# Patient Record
Sex: Female | Born: 1940 | Race: White | Hispanic: No | Smoking: Never smoker
Health system: Southern US, Community
[De-identification: ages and names within clinical notes are randomized; demographics above are authoritative.]

## PROBLEM LIST (undated history)

## (undated) DIAGNOSIS — C4490 Unspecified malignant neoplasm of skin, unspecified: Secondary | ICD-10-CM

## (undated) DIAGNOSIS — R9431 Abnormal electrocardiogram [ECG] [EKG]: Secondary | ICD-10-CM

## (undated) DIAGNOSIS — R053 Chronic cough: Secondary | ICD-10-CM

## (undated) DIAGNOSIS — R001 Bradycardia, unspecified: Secondary | ICD-10-CM

## (undated) DIAGNOSIS — Z7982 Long term (current) use of aspirin: Secondary | ICD-10-CM

## (undated) DIAGNOSIS — N8111 Cystocele, midline: Secondary | ICD-10-CM

## (undated) DIAGNOSIS — R0600 Dyspnea, unspecified: Secondary | ICD-10-CM

## (undated) DIAGNOSIS — Z9841 Cataract extraction status, right eye: Secondary | ICD-10-CM

## (undated) DIAGNOSIS — Z8719 Personal history of other diseases of the digestive system: Secondary | ICD-10-CM

## (undated) DIAGNOSIS — K760 Fatty (change of) liver, not elsewhere classified: Secondary | ICD-10-CM

## (undated) DIAGNOSIS — E785 Hyperlipidemia, unspecified: Secondary | ICD-10-CM

## (undated) DIAGNOSIS — M329 Systemic lupus erythematosus, unspecified: Secondary | ICD-10-CM

## (undated) DIAGNOSIS — I341 Nonrheumatic mitral (valve) prolapse: Secondary | ICD-10-CM

## (undated) DIAGNOSIS — I10 Essential (primary) hypertension: Secondary | ICD-10-CM

## (undated) DIAGNOSIS — R490 Dysphonia: Secondary | ICD-10-CM

## (undated) DIAGNOSIS — Z87440 Personal history of urinary (tract) infections: Secondary | ICD-10-CM

## (undated) DIAGNOSIS — I2510 Atherosclerotic heart disease of native coronary artery without angina pectoris: Secondary | ICD-10-CM

## (undated) DIAGNOSIS — C801 Malignant (primary) neoplasm, unspecified: Secondary | ICD-10-CM

## (undated) DIAGNOSIS — Q790 Congenital diaphragmatic hernia: Secondary | ICD-10-CM

## (undated) DIAGNOSIS — F419 Anxiety disorder, unspecified: Secondary | ICD-10-CM

## (undated) DIAGNOSIS — N200 Calculus of kidney: Secondary | ICD-10-CM

## (undated) DIAGNOSIS — D869 Sarcoidosis, unspecified: Secondary | ICD-10-CM

## (undated) DIAGNOSIS — IMO0002 Reserved for concepts with insufficient information to code with codable children: Secondary | ICD-10-CM

## (undated) DIAGNOSIS — I700 Atherosclerosis of aorta: Secondary | ICD-10-CM

## (undated) DIAGNOSIS — J849 Interstitial pulmonary disease, unspecified: Secondary | ICD-10-CM

## (undated) DIAGNOSIS — T7840XA Allergy, unspecified, initial encounter: Secondary | ICD-10-CM

## (undated) DIAGNOSIS — K219 Gastro-esophageal reflux disease without esophagitis: Secondary | ICD-10-CM

## (undated) DIAGNOSIS — I272 Pulmonary hypertension, unspecified: Secondary | ICD-10-CM

## (undated) DIAGNOSIS — I493 Ventricular premature depolarization: Secondary | ICD-10-CM

## (undated) HISTORY — DX: Bradycardia, unspecified: R00.1

## (undated) HISTORY — DX: Anxiety disorder, unspecified: F41.9

## (undated) HISTORY — DX: Reserved for concepts with insufficient information to code with codable children: IMO0002

## (undated) HISTORY — DX: Gilbert syndrome: E80.4

## (undated) HISTORY — PX: CATARACT EXTRACTION, BILATERAL: SHX1313

## (undated) HISTORY — DX: Gastro-esophageal reflux disease without esophagitis: K21.9

## (undated) HISTORY — DX: Personal history of urinary (tract) infections: Z87.440

## (undated) HISTORY — DX: Allergy, unspecified, initial encounter: T78.40XA

## (undated) HISTORY — DX: Abnormal electrocardiogram (ECG) (EKG): R94.31

## (undated) HISTORY — DX: Essential (primary) hypertension: I10

## (undated) HISTORY — DX: Hyperlipidemia, unspecified: E78.5

## (undated) HISTORY — PX: ABDOMINAL HYSTERECTOMY: SHX81

## (undated) HISTORY — PX: TUBAL LIGATION: SHX77

---

## 1998-06-15 DIAGNOSIS — I10 Essential (primary) hypertension: Secondary | ICD-10-CM

## 1998-06-15 DIAGNOSIS — E785 Hyperlipidemia, unspecified: Secondary | ICD-10-CM

## 1998-06-15 HISTORY — DX: Hyperlipidemia, unspecified: E78.5

## 1998-06-15 HISTORY — DX: Essential (primary) hypertension: I10

## 2000-12-15 ENCOUNTER — Encounter: Payer: Self-pay | Admitting: Family Medicine

## 2000-12-15 ENCOUNTER — Ambulatory Visit (HOSPITAL_COMMUNITY): Admission: RE | Admit: 2000-12-15 | Discharge: 2000-12-15 | Payer: Self-pay | Admitting: Family Medicine

## 2000-12-28 ENCOUNTER — Other Ambulatory Visit: Admission: RE | Admit: 2000-12-28 | Discharge: 2000-12-28 | Payer: Self-pay | Admitting: Family Medicine

## 2001-01-18 ENCOUNTER — Ambulatory Visit (HOSPITAL_COMMUNITY): Admission: RE | Admit: 2001-01-18 | Discharge: 2001-01-18 | Payer: Self-pay | Admitting: Family Medicine

## 2001-01-18 ENCOUNTER — Encounter: Payer: Self-pay | Admitting: Family Medicine

## 2001-02-09 ENCOUNTER — Ambulatory Visit (HOSPITAL_COMMUNITY): Admission: RE | Admit: 2001-02-09 | Discharge: 2001-02-09 | Payer: Self-pay | Admitting: General Surgery

## 2001-11-24 ENCOUNTER — Other Ambulatory Visit: Admission: RE | Admit: 2001-11-24 | Discharge: 2001-11-24 | Payer: Self-pay | Admitting: Dermatology

## 2002-01-12 ENCOUNTER — Ambulatory Visit (HOSPITAL_COMMUNITY): Admission: RE | Admit: 2002-01-12 | Discharge: 2002-01-12 | Payer: Self-pay | Admitting: Family Medicine

## 2002-01-12 ENCOUNTER — Encounter: Payer: Self-pay | Admitting: Family Medicine

## 2003-02-27 ENCOUNTER — Ambulatory Visit (HOSPITAL_COMMUNITY): Admission: RE | Admit: 2003-02-27 | Discharge: 2003-02-27 | Payer: Self-pay | Admitting: Family Medicine

## 2003-02-27 ENCOUNTER — Encounter: Payer: Self-pay | Admitting: Family Medicine

## 2003-03-06 ENCOUNTER — Encounter: Payer: Self-pay | Admitting: Family Medicine

## 2003-03-06 ENCOUNTER — Ambulatory Visit (HOSPITAL_COMMUNITY): Admission: RE | Admit: 2003-03-06 | Discharge: 2003-03-06 | Payer: Self-pay | Admitting: Family Medicine

## 2004-02-28 ENCOUNTER — Ambulatory Visit (HOSPITAL_COMMUNITY): Admission: RE | Admit: 2004-02-28 | Discharge: 2004-02-28 | Payer: Self-pay | Admitting: Family Medicine

## 2005-03-23 ENCOUNTER — Ambulatory Visit: Payer: Self-pay | Admitting: Family Medicine

## 2005-04-03 ENCOUNTER — Ambulatory Visit (HOSPITAL_COMMUNITY): Admission: RE | Admit: 2005-04-03 | Discharge: 2005-04-03 | Payer: Self-pay | Admitting: Family Medicine

## 2005-10-26 ENCOUNTER — Ambulatory Visit: Payer: Self-pay | Admitting: Family Medicine

## 2005-11-16 ENCOUNTER — Ambulatory Visit (HOSPITAL_COMMUNITY): Admission: RE | Admit: 2005-11-16 | Discharge: 2005-11-16 | Payer: Self-pay | Admitting: Family Medicine

## 2006-03-25 ENCOUNTER — Ambulatory Visit: Payer: Self-pay | Admitting: Family Medicine

## 2006-03-25 ENCOUNTER — Encounter: Payer: Self-pay | Admitting: Family Medicine

## 2006-03-25 ENCOUNTER — Other Ambulatory Visit: Admission: RE | Admit: 2006-03-25 | Discharge: 2006-03-25 | Payer: Self-pay | Admitting: Family Medicine

## 2006-03-25 LAB — CONVERTED CEMR LAB: Pap Smear: NORMAL

## 2006-04-07 ENCOUNTER — Ambulatory Visit (HOSPITAL_COMMUNITY): Admission: RE | Admit: 2006-04-07 | Discharge: 2006-04-07 | Payer: Self-pay | Admitting: Family Medicine

## 2006-11-15 ENCOUNTER — Encounter: Payer: Self-pay | Admitting: Family Medicine

## 2006-11-15 LAB — CONVERTED CEMR LAB
AST: 27 units/L (ref 0–37)
Albumin: 4.5 g/dL (ref 3.5–5.2)
Alkaline Phosphatase: 49 units/L (ref 39–117)
Calcium: 9 mg/dL (ref 8.4–10.5)
Creatinine, Ser: 0.82 mg/dL (ref 0.40–1.20)
HDL: 80 mg/dL (ref >39)
Total Protein: 6.9 g/dL (ref 6.0–8.3)
Triglycerides: 71 mg/dL (ref <150)

## 2007-04-25 ENCOUNTER — Ambulatory Visit (HOSPITAL_COMMUNITY): Admission: RE | Admit: 2007-04-25 | Discharge: 2007-04-25 | Payer: Self-pay | Admitting: Family Medicine

## 2007-04-28 ENCOUNTER — Encounter: Payer: Self-pay | Admitting: Family Medicine

## 2007-04-28 ENCOUNTER — Other Ambulatory Visit: Admission: RE | Admit: 2007-04-28 | Discharge: 2007-04-28 | Payer: Self-pay | Admitting: Family Medicine

## 2007-04-28 ENCOUNTER — Ambulatory Visit: Payer: Self-pay | Admitting: Family Medicine

## 2007-04-28 LAB — CONVERTED CEMR LAB
ALT: 27 units/L (ref 0–35)
AST: 25 units/L (ref 0–37)
Albumin: 4.6 g/dL (ref 3.5–5.2)
Alkaline Phosphatase: 58 units/L (ref 39–117)
BUN: 21 mg/dL (ref 6–23)
Basophils Absolute: 0 10*3/uL (ref 0.0–0.1)
Basophils Relative: 1 % (ref 0–1)
Bilirubin, Direct: 0.3 mg/dL (ref 0.0–0.3)
CO2: 26 meq/L (ref 19–32)
Calcium: 9.8 mg/dL (ref 8.4–10.5)
Chloride: 102 meq/L (ref 96–112)
Cholesterol: 206 mg/dL — ABNORMAL HIGH (ref 0–200)
Creatinine, Ser: 0.91 mg/dL (ref 0.40–1.20)
Eosinophils Absolute: 0.1 10*3/uL — ABNORMAL LOW (ref 0.2–0.7)
Eosinophils Relative: 2 % (ref 0–5)
Glucose, Bld: 95 mg/dL (ref 70–99)
HCT: 43.1 % (ref 36.0–46.0)
HDL: 85 mg/dL (ref >39)
Hemoglobin: 14.6 g/dL (ref 12.0–15.0)
Indirect Bilirubin: 1.5 mg/dL — ABNORMAL HIGH (ref 0.0–0.9)
LDL Cholesterol: 102 mg/dL — ABNORMAL HIGH (ref 0–99)
Lymphocytes Relative: 30 % (ref 12–46)
Lymphs Abs: 1 10*3/uL (ref 0.7–4.0)
MCHC: 33.9 g/dL (ref 30.0–36.0)
MCV: 91.5 fL (ref 78.0–100.0)
Monocytes Absolute: 0.3 10*3/uL (ref 0.1–1.0)
Monocytes Relative: 7 % (ref 3–12)
Neutro Abs: 2 10*3/uL (ref 1.7–7.7)
Neutrophils Relative %: 59 % (ref 43–77)
Pap Smear: NORMAL
Platelets: 194 10*3/uL (ref 150–400)
Potassium: 4 meq/L (ref 3.5–5.3)
RBC: 4.71 M/uL (ref 3.87–5.11)
RDW: 13 % (ref 11.5–15.5)
Sodium: 140 meq/L (ref 135–145)
Total Bilirubin: 1.8 mg/dL — ABNORMAL HIGH (ref 0.3–1.2)
Total CHOL/HDL Ratio: 2.4
Total Protein: 7.2 g/dL (ref 6.0–8.3)
Triglycerides: 96 mg/dL (ref <150)
VLDL: 19 mg/dL (ref 0–40)
WBC: 3.4 10*3/uL — ABNORMAL LOW (ref 4.0–10.5)

## 2007-06-02 ENCOUNTER — Encounter: Payer: Self-pay | Admitting: Family Medicine

## 2007-06-02 DIAGNOSIS — M5442 Lumbago with sciatica, left side: Secondary | ICD-10-CM | POA: Insufficient documentation

## 2007-06-02 DIAGNOSIS — H9190 Unspecified hearing loss, unspecified ear: Secondary | ICD-10-CM | POA: Insufficient documentation

## 2007-06-02 DIAGNOSIS — I10 Essential (primary) hypertension: Secondary | ICD-10-CM | POA: Insufficient documentation

## 2007-06-02 DIAGNOSIS — N329 Bladder disorder, unspecified: Secondary | ICD-10-CM | POA: Insufficient documentation

## 2007-06-02 DIAGNOSIS — M5441 Lumbago with sciatica, right side: Secondary | ICD-10-CM | POA: Insufficient documentation

## 2007-06-02 DIAGNOSIS — R945 Abnormal results of liver function studies: Secondary | ICD-10-CM | POA: Insufficient documentation

## 2007-06-02 DIAGNOSIS — E785 Hyperlipidemia, unspecified: Secondary | ICD-10-CM | POA: Insufficient documentation

## 2007-09-26 ENCOUNTER — Encounter: Payer: Self-pay | Admitting: Family Medicine

## 2007-09-26 LAB — CONVERTED CEMR LAB
ALT: 21 units/L (ref 0–35)
Albumin: 4.7 g/dL (ref 3.5–5.2)
Alkaline Phosphatase: 51 units/L (ref 39–117)
BUN: 23 mg/dL (ref 6–23)
Basophils Absolute: 0 10*3/uL (ref 0.0–0.1)
Basophils Relative: 1 % (ref 0–1)
Chloride: 102 meq/L (ref 96–112)
Cholesterol: 204 mg/dL — ABNORMAL HIGH (ref 0–200)
Eosinophils Absolute: 0.1 10*3/uL (ref 0.0–0.7)
HDL: 81 mg/dL (ref >39)
Indirect Bilirubin: 1.1 mg/dL — ABNORMAL HIGH (ref 0.0–0.9)
LDL Cholesterol: 109 mg/dL — ABNORMAL HIGH (ref 0–99)
MCHC: 33.4 g/dL (ref 30.0–36.0)
MCV: 92.1 fL (ref 78.0–100.0)
Neutro Abs: 1.6 10*3/uL — ABNORMAL LOW (ref 1.7–7.7)
Neutrophils Relative %: 56 % (ref 43–77)
Platelets: 210 10*3/uL (ref 150–400)
Potassium: 4 meq/L (ref 3.5–5.3)
RDW: 13.4 % (ref 11.5–15.5)
Total Protein: 7.1 g/dL (ref 6.0–8.3)
Triglycerides: 71 mg/dL (ref <150)
VLDL: 14 mg/dL (ref 0–40)

## 2008-01-24 ENCOUNTER — Telehealth: Payer: Self-pay | Admitting: Family Medicine

## 2008-04-02 ENCOUNTER — Encounter: Payer: Self-pay | Admitting: Family Medicine

## 2008-04-18 ENCOUNTER — Encounter: Payer: Self-pay | Admitting: Family Medicine

## 2008-05-02 ENCOUNTER — Telehealth: Payer: Self-pay | Admitting: Family Medicine

## 2008-06-01 ENCOUNTER — Ambulatory Visit (HOSPITAL_COMMUNITY): Admission: RE | Admit: 2008-06-01 | Discharge: 2008-06-01 | Payer: Self-pay | Admitting: Family Medicine

## 2008-06-13 ENCOUNTER — Encounter: Payer: Self-pay | Admitting: Family Medicine

## 2008-06-20 ENCOUNTER — Other Ambulatory Visit: Admission: RE | Admit: 2008-06-20 | Discharge: 2008-06-20 | Payer: Self-pay | Admitting: Family Medicine

## 2008-06-20 ENCOUNTER — Encounter: Payer: Self-pay | Admitting: Family Medicine

## 2008-06-20 ENCOUNTER — Ambulatory Visit: Payer: Self-pay | Admitting: Family Medicine

## 2008-06-20 DIAGNOSIS — R5383 Other fatigue: Secondary | ICD-10-CM

## 2008-06-20 DIAGNOSIS — R5381 Other malaise: Secondary | ICD-10-CM | POA: Insufficient documentation

## 2008-06-25 ENCOUNTER — Ambulatory Visit (HOSPITAL_COMMUNITY): Admission: RE | Admit: 2008-06-25 | Discharge: 2008-06-25 | Payer: Self-pay | Admitting: Family Medicine

## 2008-06-25 ENCOUNTER — Encounter: Payer: Self-pay | Admitting: Family Medicine

## 2008-06-26 ENCOUNTER — Encounter: Payer: Self-pay | Admitting: Family Medicine

## 2008-06-29 ENCOUNTER — Encounter: Payer: Self-pay | Admitting: Family Medicine

## 2008-07-03 LAB — CONVERTED CEMR LAB
Albumin: 4.3 g/dL (ref 3.5–5.2)
Alkaline Phosphatase: 56 units/L (ref 39–117)
Basophils Absolute: 0 10*3/uL (ref 0.0–0.1)
Basophils Relative: 1 % (ref 0–1)
CO2: 28 meq/L (ref 19–32)
Chloride: 103 meq/L (ref 96–112)
HDL: 82 mg/dL (ref >39)
Hemoglobin: 13.7 g/dL (ref 12.0–15.0)
LDL Cholesterol: 92 mg/dL (ref 0–99)
Lymphocytes Relative: 32 % (ref 12–46)
MCHC: 32.8 g/dL (ref 30.0–36.0)
Neutro Abs: 1.5 10*3/uL — ABNORMAL LOW (ref 1.7–7.7)
Neutrophils Relative %: 54 % (ref 43–77)
Potassium: 3.7 meq/L (ref 3.5–5.3)
RBC: 4.62 M/uL (ref 3.87–5.11)
RDW: 13 % (ref 11.5–15.5)
Total Bilirubin: 1.4 mg/dL — ABNORMAL HIGH (ref 0.3–1.2)
Total CHOL/HDL Ratio: 2.3
Total Protein: 6.7 g/dL (ref 6.0–8.3)
Triglycerides: 84 mg/dL (ref <150)
VLDL: 17 mg/dL (ref 0–40)
Vit D, 1,25-Dihydroxy: 36 (ref 30–89)

## 2008-10-15 ENCOUNTER — Telehealth: Payer: Self-pay | Admitting: Family Medicine

## 2009-05-06 ENCOUNTER — Encounter: Payer: Self-pay | Admitting: Family Medicine

## 2009-06-28 ENCOUNTER — Other Ambulatory Visit: Admission: RE | Admit: 2009-06-28 | Discharge: 2009-06-28 | Payer: Self-pay | Admitting: Family Medicine

## 2009-06-28 ENCOUNTER — Ambulatory Visit (HOSPITAL_COMMUNITY): Admission: RE | Admit: 2009-06-28 | Discharge: 2009-06-28 | Payer: Self-pay | Admitting: Family Medicine

## 2009-06-28 ENCOUNTER — Ambulatory Visit: Payer: Self-pay | Admitting: Family Medicine

## 2009-06-28 DIAGNOSIS — M899 Disorder of bone, unspecified: Secondary | ICD-10-CM | POA: Insufficient documentation

## 2009-06-28 DIAGNOSIS — M949 Disorder of cartilage, unspecified: Secondary | ICD-10-CM

## 2009-06-28 LAB — CONVERTED CEMR LAB: OCCULT 1: NEGATIVE

## 2009-07-01 LAB — CONVERTED CEMR LAB
AST: 20 units/L (ref 0–37)
Albumin: 4.6 g/dL (ref 3.5–5.2)
Alkaline Phosphatase: 57 units/L (ref 39–117)
Calcium: 9.8 mg/dL (ref 8.4–10.5)
Chloride: 102 meq/L (ref 96–112)
Creatinine, Ser: 0.68 mg/dL (ref 0.40–1.20)
Eosinophils Absolute: 0.1 10*3/uL (ref 0.0–0.7)
HDL: 74 mg/dL (ref >39)
LDL Cholesterol: 102 mg/dL — ABNORMAL HIGH (ref 0–99)
Lymphs Abs: 0.9 10*3/uL (ref 0.7–4.0)
MCV: 89.7 fL (ref 78.0–100.0)
Neutro Abs: 1.9 10*3/uL (ref 1.7–7.7)
Neutrophils Relative %: 61 % (ref 43–77)
Platelets: 201 10*3/uL (ref 150–400)
RBC: 4.45 M/uL (ref 3.87–5.11)
Sodium: 141 meq/L (ref 135–145)
Total CHOL/HDL Ratio: 2.6
Total Protein: 6.9 g/dL (ref 6.0–8.3)
Triglycerides: 87 mg/dL (ref <150)
WBC: 3.1 10*3/uL — ABNORMAL LOW (ref 4.0–10.5)

## 2009-08-09 ENCOUNTER — Ambulatory Visit: Payer: Self-pay | Admitting: Family Medicine

## 2009-09-23 ENCOUNTER — Encounter: Payer: Self-pay | Admitting: Family Medicine

## 2009-09-23 ENCOUNTER — Telehealth: Payer: Self-pay | Admitting: Family Medicine

## 2009-12-30 ENCOUNTER — Telehealth: Payer: Self-pay | Admitting: Family Medicine

## 2010-04-08 ENCOUNTER — Telehealth: Payer: Self-pay | Admitting: Family Medicine

## 2010-05-23 ENCOUNTER — Encounter: Payer: Self-pay | Admitting: Family Medicine

## 2010-07-09 ENCOUNTER — Telehealth: Payer: Self-pay | Admitting: Family Medicine

## 2010-07-09 ENCOUNTER — Other Ambulatory Visit: Payer: Self-pay | Admitting: Family Medicine

## 2010-07-09 DIAGNOSIS — Z139 Encounter for screening, unspecified: Secondary | ICD-10-CM

## 2010-07-15 ENCOUNTER — Other Ambulatory Visit: Payer: Self-pay | Admitting: Family Medicine

## 2010-07-15 ENCOUNTER — Encounter: Payer: Self-pay | Admitting: Family Medicine

## 2010-07-15 ENCOUNTER — Ambulatory Visit
Admission: RE | Admit: 2010-07-15 | Discharge: 2010-07-15 | Payer: Self-pay | Source: Home / Self Care | Attending: Family Medicine | Admitting: Family Medicine

## 2010-07-15 ENCOUNTER — Other Ambulatory Visit (HOSPITAL_COMMUNITY)
Admission: RE | Admit: 2010-07-15 | Discharge: 2010-07-15 | Disposition: A | Discharge status: Home / Self Care | Payer: Medicare Other | Source: Ambulatory Visit | Attending: Family Medicine | Admitting: Family Medicine

## 2010-07-15 DIAGNOSIS — Z124 Encounter for screening for malignant neoplasm of cervix: Secondary | ICD-10-CM | POA: Insufficient documentation

## 2010-07-15 NOTE — Progress Notes (Signed)
Summary: [facility]  Phone Note Call from Patient   Summary of Call: NEEDS HER ALTACE  SEND TO [facility]  Initial call taken by: Lind Guest,  April 08, 2010 9:23 AM    Prescriptions: ALTACE 10 MG  TABS (RAMIPRIL) Take one tablet by mouth once a day  #90 x 0   Entered by:   Adella Hare LPN   Authorized by:   Syliva Overman MD   Signed by:   Adella Hare LPN on 16/03/9603   Method used:   Faxed to ...       [facility] (mail-order)             , [place]         Ph:        Fax: [phone]   RxID:   [id number]

## 2010-07-15 NOTE — Assessment & Plan Note (Signed)
Summary: nurse visit  Nurse Visit   Vital Signs:  Patient profile:   70 year old female Menstrual status:  postmenopausal Height:      60 inches Weight:      132 pounds BMI:     25.87 O2 Sat:      97 % Pulse rate:   91 / minute Resp:     16 per minute BP sitting:   148 / 80 Cuff size:   regular  Vitals Entered By: Everitt Amber LPN (August 09, 2009 1:02 PM) Comments BP check     Allergies: 1)  ! Bactrim  Orders Added: 1)  No Charge Patient Arrived (NCPA0) [NCPA0] recheck  148/80 and 150/ 80 which correlates with her machine, her log shows most often BP less than 140, so no med changes, if the SBp  is over 140 mosrt of the time call for office visit.

## 2010-07-15 NOTE — Assessment & Plan Note (Signed)
Summary: phy   Vital Signs:  Patient profile:   70 year old female Menstrual status:  postmenopausal Height:      60 inches Weight:      131 pounds BMI:     25.68 O2 Sat:      97 % Pulse rate:   56 / minute Pulse rhythm:   regular Resp:     16 per minute BP sitting:   140 / 80  (left arm)  Vitals Entered By: Everitt Amber (June 28, 2009 9:36 AM)  Nutrition Counseling: Patient's BMI is greater than 25 and therefore counseled on weight management options. CC: CPE  Vision Screening:Left eye with correction: 20 / 30 Right eye with correction: 20 / 25 Both eyes with correction: 20 / 25  Color vision testing: normal      Vision Entered By: Everitt Amber (June 28, 2009 9:43 AM)     Menstrual Status postmenopausal Last PAP Result NEGATIVE FOR INTRAEPITHELIAL LESIONS OR MALIGNANCY.   Primary Care Provider:  Syliva Overman MD  CC:  CPE.  History of Present Illness: Reports  that she has been  doing well. Denies recent fever or chills. Denies sinus pressure, nasal congestion , ear pain or sore throat. Denies chest congestion, or cough productive of sputum. Denies chest pain, palpitations, PND, orthopnea or leg swelling. Denies abdominal pain, nausea, vomitting, diarrhea or constipation. Denies change in bowel movements or bloody stool. 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 Not committing to pelvic US for further eval, concerned about ins coverage. Denies  joint pain, she is very aware of the limitations in her back and actsappropriately to reduce the risk of injury.She recently note a swelling on the knuckle of her right index finger which is essentially painless, she does report some weakness in the grip however. Denies headaches, vertigo, seizures.Chronic hearing loss. Denies depression, anxiety or insomnia. Denies  rash, lesions, or itch.     Current  Medications (verified): 1)  Altace 10 Mg  Tabs (Ramipril) .... Take One Tablet By Mouth Once A Day 2)  Zocor 10 Mg  Tabs (Simvastatin) .... Take One Tablet By Mouth Once A Day 3)  Hydrochlorothiazide 25 Mg  Tabs (Hydrochlorothiazide) .... Take One Tablet By Mouth Once Daily 4)  Aspirin 81 Mg  Tbec (Aspirin) .... Take One Tablet By Mouth Once A Day  Allergies (verified): 1)  ! Bactrim  Review of Systems      See HPI Eyes:  Denies blurring and discharge; has had eye exam recently. ENT:  Complains of decreased hearing; chronic. GU:  See HPI. MS:  See HPI. Neuro:  Denies headaches, seizures, and sensation of room spinning. Heme:  Denies abnormal bruising and bleeding. Allergy:  Denies hives or rash and sneezing.  Physical Exam  General:  Well-developed,well-nourished,in no acute distress; alert,appropriate and cooperative throughout examination Head:  Normocephalic and atraumatic without obvious abnormalities. No apparent alopecia or balding. Eyes:  No corneal or conjunctival inflammation noted. EOMI. Perrla. Funduscopic exam benign, without hemorrhages, exudates or papilledema. Vision grossly normal. Ears:  External ear exam shows no significant lesions or deformities.  Otoscopic examination reveals clear canals, tympanic membranes are intact bilaterally without bulging, retraction, inflammation or discharge. Hearing is grossly normal bilaterally. Nose:  External nasal examination shows no deformity or inflammation. Nasal mucosa are pink and moist without lesions or exudates. Mouth:  pharynx pink and moist and fair dentition.   Neck:  No deformities,  masses, or tenderness noted. Chest Wall:  No deformities, masses, or tenderness noted. Breasts:  No mass, nodules, thickening, tenderness, bulging, retraction, inflamation, nipple discharge or skin changes noted.   Lungs:  Normal respiratory effort, chest expands symmetrically. Lungs are clear to auscultation, no crackles or wheezes. Heart:   Normal rate and regular rhythm. S1 and S2 normal without gallop, murmur, click, rub or other extra sounds. Abdomen:  Bowel sounds positive,abdomen soft and non-tender without masses, organomegaly or hernias noted. Rectal:  No external abnormalities noted. Normal sphincter tone. No rectal masses or tenderness.Guaic neg stool Genitalia:  Normal introitus for age, no external lesions, no vaginal discharge, mucosa pink and moist, no vaginal or cervical lesions, no vaginal atrophy, no friaility or hemorrhage, normal uterus size and position, no adnexal masses or tenderness. positive bladder prolapse which is not new Msk:  No deformity or scoliosis noted of thoracic or lumbar spine.   Pulses:  R and L carotid,radial,femoral,dorsalis pedis and posterior tibial pulses are full and equal bilaterally Extremities:  No clubbing, cyanosis, edema, or deformity noted with normal full range of motion of all joints.   Neurologic:  alert & oriented X3 and cranial nerves II-XII intact, except for cN7  alert & oriented X3, cranial nerves II-XII intact, strength normal in all extremities, sensation intact to light touch, gait normal, and finger-to-nose normal.   Skin:  multiple warts and moles on the torso, also SK Cervical Nodes:  No lymphadenopathy noted Axillary Nodes:  No palpable lymphadenopathy Inguinal Nodes:  No significant adenopathy Psych:  Cognition and judgment appear intact. Alert and cooperative with normal attention span and concentration. No apparent delusions, illusions, hallucinations   Impression & Recommendations:  Problem # 1:  SPECIAL SCREENING FOR MALIGNANT NEOPLASMS COLON (ICD-V76.51) Assessment Comment Only  Orders: Hemoccult Guaiac-1 spec.(in office) (82270) guaic neg stool, no rectal mass palpated  Problem # 2:  OSTEOPENIA (ICD-733.90) Assessment: Comment Only  Orders: T-Vitamin D (25-Hydroxy) ([id number])  Problem # 3:  BLADDER PROLAPSE (ICD-596.9) Assessment:  Unchanged  Problem # 4:  BACK PAIN, CHRONIC (ICD-724.5) Assessment: Improved  Her updated medication list for this problem includes:    Aspirin 81 Mg Tbec (Aspirin) .Marland Kitchen... Take one tablet by mouth once a day  Problem # 5:  HYPERLIPIDEMIA (ICD-272.4) Assessment: Comment Only  Her updated medication list for this problem includes:    Zocor 10 Mg Tabs (Simvastatin) .Marland Kitchen... 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 Take one tablet by mouth once a day    Hydrochlorothiazide 25 Mg Tabs (Hydrochlorothiazide) .Marland Kitchen... Take one tablet by mouth once daily  Orders: T-Basic Metabolic Panel [phone])  BP today: 140/80 Prior BP: 130/70 (06/20/2008)  Labs Reviewed: K+: 3.7 (06/29/2008) Creat: : 0.81 (06/29/2008)   Chol: 191 (06/29/2008)   HDL: 82 (06/29/2008)   LDL: 92 (06/29/2008)   TG: 84 (06/29/2008)  Complete Medication List: 1)  Altace 10 Mg Tabs (Ramipril) .... Take one tablet by mouth once  a day 2)  Zocor 10 Mg Tabs (Simvastatin) .... Take one tablet by mouth once a day 3)  Hydrochlorothiazide 25 Mg Tabs (Hydrochlorothiazide) .... Take one tablet by mouth once daily 4)  Aspirin 81 Mg Tbec (Aspirin) .... Take one tablet by mouth once a day  Other Orders: T-CBC w/Diff ([id number]) Pelvic & Breast Exam ( Medicare)  (Q5956)  Patient Instructions: 1)  Nurse bP check in 6 weeks,  pLEASE bring your cuff. 2)  MD f/u in 1 year, per pot only visit covered is "welness exam" 3)  It is important that you exercise regularly at least 20 minutes 5 times a week. If you develop chest pain, have severe difficulty breathing, or feel very tired , stop exercising immediately and seek medical attention. 4)  You need to lose weight. Consider a lower calorie diet and regular exercise.  5)  BMP prior to visit, ICD-9: 6)  Hepatic Panel prior to visit, ICD-9: today 7)  Lipid Panel prior to visit, ICD-9: 8)  Vit D level osteopenia Prescriptions: ZOCOR 10 MG  TABS (SIMVASTATIN) Take one tablet by mouth once a day  #90 x 3   Entered by:   Everitt Amber   Authorized by:   Syliva Overman MD   Signed by:   Everitt Amber on 06/28/2009   Method used:   Printed then faxed to ...       [facility] (mail-order)             , [place]         Ph:        Fax: [phone]   RxID:   [id number] ALTACE 10 MG  TABS (RAMIPRIL) Take one tablet by mouth once a day  #90 x 0   Entered by:   Everitt Amber   Authorized by:   Syliva Overman MD   Signed by:   Everitt Amber on 06/28/2009   Method used:   Printed then faxed to ...       [facility] (mail-order)             , [place]         Ph:        Fax: [phone]   RxID:   [id number] ZOCOR 10 MG  TABS (SIMVASTATIN) Take one tablet by mouth once a day  #90 x 3   Entered by:   Everitt Amber   Authorized by:   Syliva Overman MD   Signed by:   Everitt Amber on 06/28/2009   Method used:   Faxed to ...       [facility] (mail-order)             , [place]         Ph:        Fax: [phone]   RxID:   [id number] ALTACE 10 MG  TABS (RAMIPRIL) Take one tablet by mouth once a day  #90 x 0   Entered by:   Everitt Amber   Authorized by:   Syliva Overman MD   Signed by:   Everitt Amber on 06/28/2009   Method used:   Faxed to ...       [facility] [employer])             , [place]         Ph:        Fax: [phone]   RxID:   [id number]   Laboratory  Results   Blood Tests   Date/Time Received:  June 28, 2009  Date/Time Reported: June 28, 2009      Stool - Occult Blood Hemmoccult #1: negative Date: 06/28/2009 Comments: [id number] 9r 01/2010 18 10/12     Appended Document: phy pls recode if needed so the cost is coverd, pt voices repeated concerns about coverage, staes her ins only pays for once yearlyWellness exams", and she actually comes in onlyonce yearly for the most part...help needed, she goes on and on , even still talking about last years bll which is unresolved it appears!   Appended Document: phy Appears to be coded correctly.

## 2010-07-15 NOTE — Progress Notes (Signed)
Summary: medicine  Phone Note Call from Patient   Summary of Call: needs refill on her altace 10 mg call in at [facility] leaving saturday for vacation  needs this medicine  call back at [phone] to let her know  Initial call taken by: Lind Guest,  September 23, 2009 9:30 AM  Follow-up for Phone Call        Rx Called In Follow-up by: Adella Hare LPN,  September 23, 2009 9:33 AM    Prescriptions: ALTACE 10 MG  TABS (RAMIPRIL) Take one tablet by mouth once a day  #90 x 0   Entered by:   Adella Hare LPN   Authorized by:   Syliva Overman MD   Signed by:   Adella Hare LPN on 16/03/9603   Method used:   Faxed to ...       [facility] (mail-order)             , [place]         Ph:        Fax: [phone]   RxID:   [id number]

## 2010-07-15 NOTE — Assessment & Plan Note (Signed)
  Nurse Visit  Comments Patient was told to come back in to gget pneumonia vaccine because it was forgotten at last visit   Allergies: 1)  ! Bactrim  Orders Added: 1)  Pneumococcal Vaccine [90732] 2)  Admin 1st Vaccine [90471] 3)  Admin 1st Vaccine [facility]) [90471S]   Pneumovax    Vaccine Type: Pneumovax    Site: left deltoid    Mfr: Merck    Dose: 0.5 ml    Route: IM    Given by: Everitt Amber LPN    Exp. Date: 1/272012    Lot #: [id number]

## 2010-07-15 NOTE — Progress Notes (Signed)
Summary: refill  Phone Note Call from Patient   Summary of Call: needs a rx for altace sent into [facility] [phone] Initial call taken by: Rudene Anda,  December 30, 2009 9:44 AM  Follow-up for Phone Call        Phone Call Completed, Rx Called In Follow-up by: Adella Hare LPN,  December 30, 2009 11:42 AM    Prescriptions: ALTACE 10 MG  TABS (RAMIPRIL) Take one tablet by mouth once a day  #90 x 0   Entered by:   Adella Hare LPN   Authorized by:   Syliva Overman MD   Signed by:   Adella Hare LPN on 56/21/3086   Method used:   Faxed to ...       [facility] (mail-order)             , [place]         Ph:        Fax: [phone]   RxID:   [id number]

## 2010-07-17 ENCOUNTER — Ambulatory Visit (HOSPITAL_COMMUNITY)
Admission: RE | Admit: 2010-07-17 | Discharge: 2010-07-17 | Disposition: A | Discharge status: Home / Self Care | Payer: MEDICARE | Source: Ambulatory Visit | Attending: Family Medicine | Admitting: Family Medicine

## 2010-07-17 ENCOUNTER — Encounter: Payer: Self-pay | Admitting: Family Medicine

## 2010-07-17 ENCOUNTER — Ambulatory Visit: Payer: Medicare Other

## 2010-07-17 DIAGNOSIS — Z139 Encounter for screening, unspecified: Secondary | ICD-10-CM

## 2010-07-17 DIAGNOSIS — Z23 Encounter for immunization: Secondary | ICD-10-CM

## 2010-07-17 DIAGNOSIS — Z1231 Encounter for screening mammogram for malignant neoplasm of breast: Secondary | ICD-10-CM | POA: Insufficient documentation

## 2010-07-17 DIAGNOSIS — Z2911 Encounter for prophylactic immunotherapy for respiratory syncytial virus (RSV): Secondary | ICD-10-CM

## 2010-07-17 NOTE — Letter (Signed)
Summary: audiology hearing aid  audiology hearing aid   Imported By: Lind Guest 06/05/2010 08:30:29  _____________________________________________________________________  External Attachment:    Type:   Image     Comment:   External Document

## 2010-07-17 NOTE — Progress Notes (Signed)
Summary: refill   Phone Note Call from Patient   Summary of Call: pt needs to get a refill on altace thru to [facility]   [phone] Initial call taken by: Rudene Anda,  July 09, 2010 10:46 AM    Prescriptions: ALTACE 10 MG  TABS (RAMIPRIL) Take one tablet by mouth once a day  #90 x 0   Entered by:   Adella Hare LPN   Authorized by:   Syliva Overman MD   Signed by:   Adella Hare LPN on 09/81/1914   Method used:   Faxed to ...       [facility] (mail-order)             , [place]         Ph:        Fax: [phone]   RxID:   [id number]

## 2010-07-18 LAB — CONVERTED CEMR LAB
ALT: 20 units/L (ref 0–35)
Basophils Absolute: 0 10*3/uL (ref 0.0–0.1)
Basophils Relative: 1 % (ref 0–1)
Cholesterol: 183 mg/dL (ref 0–200)
Eosinophils Absolute: 0.1 10*3/uL (ref 0.0–0.7)
Indirect Bilirubin: 1.3 mg/dL — ABNORMAL HIGH (ref 0.0–0.9)
MCHC: 34.2 g/dL (ref 30.0–36.0)
MCV: 89.7 fL (ref 78.0–100.0)
Neutrophils Relative %: 55 % (ref 43–77)
Platelets: 212 10*3/uL (ref 150–400)
Potassium: 3.4 meq/L — ABNORMAL LOW (ref 3.5–5.3)
Sodium: 141 meq/L (ref 135–145)
Total CHOL/HDL Ratio: 2.5
Total Protein: 6.6 g/dL (ref 6.0–8.3)
Triglycerides: 85 mg/dL (ref <150)
VLDL: 17 mg/dL (ref 0–40)

## 2010-07-23 NOTE — Assessment & Plan Note (Signed)
Summary: Nurse visit-Shingles shot   Nurse Visit   Vital Signs:  Patient profile:   70 year old female Menstrual status:  postmenopausal Height:      60 inches Weight:      126 pounds BMI:     24.70 BP sitting:   130 / 78  (left arm) Cuff size:   regular  Vitals Entered By: Everitt Amber LPN (July 17, 2010 10:17 AM) CC: Patient in to get shingles shot    Allergies: 1)  ! Bactrim  Orders Added: 1)  Zoster (Shingles) Vaccine Live [90736] 2)  Admin 1st Vaccine [90471] 3)  Admin 1st Vaccine [facility]) [16109U]    Zostavax # 1    Vaccine Type: Zostavax    Site: right deltoid    Mfr: mere    Dose: 0.5 ml    Route: [place]    Given by: Everitt Amber LPN    Exp. Date: 04/20/2011    Lot #: [id number] Vaccine administered with no noted complications

## 2010-07-23 NOTE — Letter (Signed)
Summary: EMERGENCY CONTACTS  EMERGENCY CONTACTS   Imported By: Lind Guest 07/15/2010 14:11:50  _____________________________________________________________________  External Attachment:    Type:   Image     Comment:   External Document

## 2010-07-23 NOTE — Assessment & Plan Note (Signed)
Summary: cpe   Vital Signs:  Patient profile:   70 year old female Menstrual status:  postmenopausal Height:      60 inches Weight:      125 pounds BMI:     24.50 O2 Sat:      98 % on Room air Pulse rate:   77 / minute Pulse rhythm:   regular Resp:     16 per minute BP sitting:   132 / 70  (left arm)  Vitals Entered By: Adella Hare LPN (July 15, 2010 2:02 PM)  O2 Flow:  Room air  Vision Screening:Left eye with correction: 20 / 30 Right eye with correction: 20 / 30 Both eyes with correction: 20 / 30        Vision Entered By: Adella Hare LPN (July 15, 2010 2:03 PM)   Primary Care Provider:  Syliva Overman MD   History of Present Illness: Reports  that she has been fairly well, except for the unexpected loss of her spouse of 46 years in the summer of 2011 Denies recent fever or chills. Denies sinus pressure, nasal congestion , ear pain or sore throat. Denies chest congestion, or cough productive of sputum. Denies chest pain, palpitations, PND, orthopnea or leg swelling. Denies abdominal pain, nausea, vomitting, diarrhea or constipation. Denies change in bowel movements or bloody stool. Denies dysuria , frequency, incontinence or hesitancy. Denies  joint pain, swelling, or reduced mobility. Denies headaches, vertigo, seizures. Denies depression, anxiety or insomnia. Denies  rash, lesions, or itch.     Current Medications (verified): 1)  Altace 10 Mg  Tabs (Ramipril) .... Take One Tablet By Mouth Once A Day 2)  Zocor 10 Mg  Tabs (Simvastatin) .... Take One Tablet By Mouth Once A Day 3)  Hydrochlorothiazide 25 Mg  Tabs (Hydrochlorothiazide) .... Take One Tablet By Mouth Once Daily 4)  Aspirin 81 Mg  Tbec (Aspirin) .... Take One Tablet By Mouth Once A Day  Allergies (verified): 1)  ! Bactrim  Past History:  Past medical, surgical, family and social histories (including risk factors) reviewed, and no changes noted (except as noted below).  Past Medical  History: Reviewed history from 06/02/2007 and no changes required. Current Problems:  BLADDER PROLAPSE (ICD-596.9) BACK PAIN, CHRONIC (ICD-724.5) ESSENTIAL HYPERTENSION, BENIGN (ICD-401.1) HEARING LOSS (ICD-389.9) GILBERT'S SYNDROME (ICD-277.4) LIVER FUNCTION TESTS, ABNORMAL (ICD-794.8) HYPERLIPIDEMIA (ICD-272.4)  Past Surgical History: Reviewed history from 06/02/2007 and no changes required. Tubal ligation  Family History: Reviewed history from 06/02/2007 and no changes required. Dad 38 healthy Mom 20 healthy Sister zero Brother healthy  Social History: Reviewed history from 06/02/2007 and no changes required. Retired Two age 53 and 4 widow in 2011 Never Smoked Alcohol use-no Drug use-no  Review of Systems      See HPI General:  Complains of malaise. Eyes:  Complains of vision loss-both eyes. ENT:  Complains of decreased hearing; new hearing aids in the past 6 months. Psych:  Complains of depression; denies suicidal thoughts/plans, thoughts of violence, and unusual visions or sounds; mild depression/grief after losing her spouse of 46 yrs unexpectedly. Endo:  Denies cold intolerance, excessive hunger, excessive thirst, and heat intolerance. Heme:  Denies abnormal bruising and bleeding. Allergy:  Denies hives or rash and itching eyes.  Physical Exam  General:  Well-developed,well-nourished,in no acute distress; alert,appropriate and cooperative throughout examination Head:  Normocephalic and atraumatic without obvious abnormalities. No apparent alopecia or balding. Eyes:  No corneal or conjunctival inflammation noted. EOMI. Perrla. Funduscopic exam benign, without hemorrhages, exudates  or papilledema. Vision grossly normal. Ears:  External ear exam shows no significant lesions or deformities.  Otoscopic examination reveals clear canals, tympanic membranes are intact bilaterally without bulging, retraction, inflammation or discharge. Hearing is grossly normal  bilaterally. Nose:  External nasal examination shows no deformity or inflammation. Nasal mucosa are pink and moist without lesions or exudates. Mouth:  Oral mucosa and oropharynx without lesions or exudates.  Teeth in good repair. Neck:  No deformities, masses, or tenderness noted. Chest Wall:  No deformities, masses, or tenderness noted. Breasts:  No mass, nodules, thickening, tenderness, bulging, retraction, inflamation, nipple discharge or skin changes noted.   Lungs:  Normal respiratory effort, chest expands symmetrically. Lungs are clear to auscultation, no crackles or wheezes. Heart:  Normal rate and regular rhythm. S1 and S2 normal without gallop, murmur, click, rub or other extra sounds. Abdomen:  Bowel sounds positive,abdomen soft and non-tender without masses, organomegaly or hernias noted. Rectal:  No external abnormalities noted. Normal sphincter tone. No rectal masses or tenderness. Genitalia:  Normal introitus for age, no external lesions, no vaginal discharge, mucosa pink and moist, no vaginal or cervical lesions, no vaginal atrophy, no friaility or hemorrhage, normal uterus size and position, no adnexal masses or tendernessBladder prolapse Msk:  No deformity or scoliosis noted of thoracic or lumbar spine.   Pulses:  R and L carotid,radial,femoral,dorsalis pedis and posterior tibial pulses are full and equal bilaterally Extremities:  No clubbing, cyanosis, edema, or deformity noted with normal full range of motion of all joints.   Neurologic:  No cranial nerve deficits noted, except for profoun hearing loss Station and gait are normal. Plantar reflexes are down-going bilaterally. DTRs are symmetrical throughout. Sensory, motor and coordinative functions appear intact. Skin:  Intact without suspicious lesions or rashes Axillary Nodes:  No palpable lymphadenopathy Inguinal Nodes:  No significant adenopathy Psych:  Cognition and judgment appear intact. Alert and cooperative with normal  attention span and concentration. No apparent delusions, illusions, hallucinations   Impression & Recommendations:  Problem # 1:  SPECIAL SCREENING FOR MALIGNANT NEOPLASMS COLON (ICD-V76.51) Assessment Comment Only  Orders: Hemoccult Guaiac-1 spec.(in office) (82270)  Problem # 2:  HYPERLIPIDEMIA (ICD-272.4) Assessment: Comment Only  Her updated medication list for this problem includes:    Zocor 10 Mg Tabs (Simvastatin) .Marland Kitchen... Take one tablet by mouth once a day Low fat dietdiscussed and encouraged  Orders: Medicare Electronic Prescription [phone]) T-Hepatic Function [phone]) T-Lipid Profile [phone]) T-Hepatic Function [phone]) T-Lipid Profile [phone])  Labs Reviewed: SGOT: 20 (06/28/2009)   SGPT: 19 (06/28/2009)   HDL:74 (06/28/2009), 82 (06/29/2008)  LDL:102 (06/28/2009), 92 (24/40/1027)  Chol:193 (06/28/2009), 191 (06/29/2008)  Trig:87 (06/28/2009), 84 (06/29/2008)  Problem # 3:  ESSENTIAL HYPERTENSION, BENIGN (ICD-401.1) Assessment: Improved  Her updated medication list for this problem includes:    Altace 10 Mg Tabs (Ramipril) .Marland Kitchen... Take one tablet by mouth once a day    Hydrochlorothiazide 25 Mg Tabs (Hydrochlorothiazide) .Marland Kitchen... Take one tablet by mouth once daily  Orders: Medicare Electronic Prescription [phone]) T-Basic Metabolic Panel [phone])  BP today: 132/70 Prior BP: 148/80 (08/09/2009)  Labs Reviewed: K+: 3.8 (06/28/2009) Creat: : 0.68 (06/28/2009)   Chol: 193 (06/28/2009)   HDL: 74 (06/28/2009)   LDL: 102 (06/28/2009)   TG: 87 (06/28/2009)  Complete Medication List: 1)  Altace 10 Mg Tabs (Ramipril) .... Take one tablet by mouth once a day 2)  Zocor 10 Mg Tabs (Simvastatin) .... Take one tablet by mouth once a day 3)  Hydrochlorothiazide 25 Mg Tabs (Hydrochlorothiazide) .Marland KitchenMarland KitchenMarland Kitchen  Take one tablet by mouth once daily 4)  Aspirin 81 Mg Tbec (Aspirin) .... Take one tablet by mouth once a day  Other Orders: T-CBC w/Diff  ([id number]) T-TSH ([id number]) Pelvic & Breast Exam ( Medicare)  (G9562)  Patient Instructions: 1)  CPE in 12 months 2)  I am sorry about your recent loss. 3)  BMP prior to visit, ICD-9: 4)  Hepatic Panel prior to visit, ICD-9: 5)  Lipid Panel prior to visit, ICD-9: fasting asap 6)  TSH prior to visit, ICD-9: 7)  CBC w/ Diff prior to visit, ICD-9: 8)  lipid , hepatic and chem 7 fasting in 6 months 9)  pls call back if you want to get the shingles vaccine in the office Prescriptions: ALTACE 10 MG  TABS (RAMIPRIL) Take one tablet by mouth once a day  #90 x 0   Entered by:   Everitt Amber LPN   Authorized by:   Syliva Overman MD   Signed by:   Everitt Amber LPN on 13/01/6577   Method used:   Faxed to ...       [facility] (mail-order)             , [place]         Ph: [phone]       Fax: [phone]   RxID:   [id number] ZOCOR 10 MG  TABS (SIMVASTATIN) Take one tablet by mouth once a day  #90 x 3   Entered by:   Everitt Amber LPN   Authorized by:   Syliva Overman MD   Signed by:   Everitt Amber LPN on 63/87/5643   Method used:   Faxed to ...       [facility] (mail-order)             , [place]         Ph: [phone]       Fax: [phone]   RxID:   [id number] HYDROCHLOROTHIAZIDE 25 MG  TABS (HYDROCHLOROTHIAZIDE) Take one tablet by mouth once daily  #90 Tablet x 0   Entered by:   Everitt Amber LPN   Authorized by:   Syliva Overman MD   Signed by:   Everitt Amber LPN on 12/06/7626   Method used:   Faxed to ...       [facility] (mail-order)             , [place]         Ph: [phone]       Fax: [phone]   RxID:   [id number]    Orders Added: 1)  Est. Patient 65& > [99371] 2)  Medicare Electronic Prescription [G8553] 3)  T-Basic Metabolic Panel [[id number]] 4)  T-Hepatic Function [80076-22960] 5)  T-Lipid Profile [80061-22930] 6)  T-CBC w/Diff [17510-25852] 7)  T-TSH [77824-23536] 8)  T-Basic Metabolic Panel [80048-22910] 9)  T-Hepatic Function [80076-22960] 10)   T-Lipid Profile [80061-22930] 11)  Pelvic & Breast Exam ( Medicare)  [G0101] 12)  Hemoccult Guaiac-1 spec.(in office) [82270]    Laboratory Results    Stool - Occult Blood Hemmoccult #1: negative Date: 07/15/2010 Comments: 51301 13l 10/13 118 10/13

## 2010-07-23 NOTE — Letter (Signed)
Summary: Letter  Letter   Imported By: Lind Guest 07/18/2010 15:19:51  _____________________________________________________________________  External Attachment:    Type:   Image     Comment:   External Document

## 2010-09-03 ENCOUNTER — Other Ambulatory Visit: Payer: Self-pay | Admitting: Family Medicine

## 2010-10-20 ENCOUNTER — Telehealth (INDEPENDENT_AMBULATORY_CARE_PROVIDER_SITE_OTHER): Payer: Medicare Other | Admitting: Family Medicine

## 2010-10-20 DIAGNOSIS — E785 Hyperlipidemia, unspecified: Secondary | ICD-10-CM

## 2010-10-20 DIAGNOSIS — I10 Essential (primary) hypertension: Secondary | ICD-10-CM

## 2010-10-20 MED ORDER — SIMVASTATIN 10 MG PO TABS: 10.0000 mg | ORAL_TABLET | Freq: Every day | ORAL | Status: DC

## 2010-10-20 MED ORDER — HYDROCHLOROTHIAZIDE 25 MG PO TABS: 25.0000 mg | ORAL_TABLET | Freq: Every day | ORAL | Status: DC

## 2010-10-20 NOTE — Telephone Encounter (Signed)
meds sent as requested 

## 2010-11-04 ENCOUNTER — Telehealth: Payer: Self-pay | Admitting: Family Medicine

## 2010-11-05 NOTE — Telephone Encounter (Signed)
Needs office visit scheduled when an available slot is (next week prob),

## 2010-11-05 NOTE — Telephone Encounter (Signed)
Patient states this fall happened in March and patient states she still has a spot on her head, wants to know what she should do, states no headaches, no loss of consciousness, no memory loss, but can still feel the spot

## 2010-11-11 ENCOUNTER — Encounter: Payer: Self-pay | Admitting: Family Medicine

## 2010-11-11 ENCOUNTER — Ambulatory Visit (INDEPENDENT_AMBULATORY_CARE_PROVIDER_SITE_OTHER): Payer: Medicare Other | Admitting: Family Medicine

## 2010-11-11 VITALS — BP 130/70 | HR 87 | Resp 16 | Ht 62.0 in | Wt 125.4 lb

## 2010-11-11 DIAGNOSIS — N329 Bladder disorder, unspecified: Secondary | ICD-10-CM

## 2010-11-11 DIAGNOSIS — R51 Headache: Secondary | ICD-10-CM

## 2010-11-11 DIAGNOSIS — R002 Palpitations: Secondary | ICD-10-CM | POA: Insufficient documentation

## 2010-11-11 DIAGNOSIS — I10 Essential (primary) hypertension: Secondary | ICD-10-CM

## 2010-11-11 DIAGNOSIS — E785 Hyperlipidemia, unspecified: Secondary | ICD-10-CM

## 2010-11-11 DIAGNOSIS — R5381 Other malaise: Secondary | ICD-10-CM

## 2010-11-11 DIAGNOSIS — R519 Headache, unspecified: Secondary | ICD-10-CM | POA: Insufficient documentation

## 2010-11-11 NOTE — Patient Instructions (Signed)
F/u in 3 months.  You will be referred for a brain scan due to "drunk feeling and headache/pressure for the past 2 months following your fall off the ladder, as well as to cardiologist regarding your new palpitations for the past 1 week

## 2010-11-12 NOTE — Assessment & Plan Note (Signed)
Controlled, no change in medication  

## 2010-11-12 NOTE — Assessment & Plan Note (Addendum)
New following direct trauma, with new symptom of drunkeness"  , MRI brain ordered. If no abnormality and symptoms persist will refer to neurology

## 2010-11-12 NOTE — Assessment & Plan Note (Signed)
New symptom , currently in sinus rhythm on exam, will refer to cardiology for further eval, ensure TSH has been checked in past 6 months

## 2010-11-12 NOTE — Progress Notes (Signed)
  Subjective:    Patient ID: Rebecca Gutierrez, female    DOB: 11-19-1940, 70 y.o.   MRN: [id number]  HPI Pt in today with a 2 month h/o "drunk feeling" and just not feeling right after falling off a ladder and hitting the left side of her head. She has also developed new headache since the trauma , not daily, but she does now c/o "pain" intermittently. She denies any new weakness or numbness, but feels as though she has not been right ever since. At times she feels as though there is a swelling in the area of direct trauma. These are new symptoms. In the past week , she has developed palpitations intermittently. May occur at rest, denies chest pain, or diaphoresis. No recent change in caffeine intake, denies increased stress or anxiety in recent times, however , of significance she unexpectedly became a widow last year and lives alone. Her adult children are in the state    Review of Systems Denies recent fever or chills. Denies sinus pressure, nasal congestion, ear pain or sore throat. Denies chest congestion, productive cough or wheezing. Denies  paroxysmal nocturnal dyspnea, orthopnea and leg swelling Denies abdominal pain, nausea, vomiting,diarrhea or constipation.    Denies  seizure, numbness, or tingling. Denies depression,reports mild anxiety or insomnia. Denies skin break down or rash.        Objective:   Physical Exam Patient alert and oriented and in no Cardiopulmonary distress.  HEENT: No facial asymmetry, EOMI, no sinus tenderness, TM's clear, Oropharynx pink and moist.  Neck supple no adenopathy.  Chest: Clear to auscultation bilaterally.  CVS: S1, S2 no murmurs, no S3.Regular heart rate, apical rate  synchronous with radial  pulse   ABD: Soft non tender. Bowel sounds normal.  Ext: No edema  MS: Adequate ROM spine, shoulders, hips and knees.  Skin: Intact, no ulcerations or rash noted.  Psych: Good eye contact, normal affect. Memory intact mildly anxious and  depressed appearing.  CNS: CN 2-12 intact, power, tone and sensation normal throughout.        Assessment & Plan:

## 2010-11-14 ENCOUNTER — Ambulatory Visit (HOSPITAL_COMMUNITY)
Admission: RE | Admit: 2010-11-14 | Discharge: 2010-11-14 | Disposition: A | Discharge status: Home / Self Care | Payer: Medicare Other | Source: Ambulatory Visit | Attending: Family Medicine | Admitting: Family Medicine

## 2010-11-14 DIAGNOSIS — R51 Headache: Secondary | ICD-10-CM | POA: Insufficient documentation

## 2010-11-15 LAB — HEPATIC FUNCTION PANEL
ALT: 17 U/L (ref 0–35)
Bilirubin, Direct: 0.3 mg/dL (ref 0.0–0.3)
Indirect Bilirubin: 1.7 mg/dL — ABNORMAL HIGH (ref 0.0–0.9)

## 2010-11-15 LAB — BASIC METABOLIC PANEL
CO2: 31 mEq/L (ref 19–32)
Calcium: 10 mg/dL (ref 8.4–10.5)
Chloride: 101 mEq/L (ref 96–112)
Glucose, Bld: 101 mg/dL — ABNORMAL HIGH (ref 70–99)
Potassium: 4.3 mEq/L (ref 3.5–5.3)
Sodium: 141 mEq/L (ref 135–145)

## 2010-11-15 LAB — LIPID PANEL
Cholesterol: 197 mg/dL (ref 0–200)
VLDL: 14 mg/dL (ref 0–40)

## 2010-11-19 ENCOUNTER — Telehealth: Payer: Self-pay | Admitting: Family Medicine

## 2010-11-19 NOTE — Telephone Encounter (Signed)
Advised labs ok, patient wants to know can you prescribe a low dose med for anxiety?

## 2010-11-20 ENCOUNTER — Other Ambulatory Visit: Payer: Self-pay | Admitting: Family Medicine

## 2010-11-20 DIAGNOSIS — F419 Anxiety disorder, unspecified: Secondary | ICD-10-CM

## 2010-11-20 MED ORDER — ALPRAZOLAM 0.25 MG PO TABS: ORAL_TABLET | ORAL | Status: DC

## 2010-11-20 NOTE — Telephone Encounter (Signed)
Med sent patient aware 

## 2010-11-20 NOTE — Telephone Encounter (Signed)
Advise pt I will send in a low dose of xanax for as needed use only, goal is no more than 3 tabs per week, I suggest she consider therapy short termI will refer her if she agrees, this will help alot, she has had a lot of changes in her life in the past year unexpectedly with the loss of her spouse. I am entering the script pls fax. Also let pt know if this is insufficient then she needs to make an appt , no refills will be on the script.

## 2010-11-20 NOTE — Assessment & Plan Note (Signed)
unchanged

## 2010-12-14 ENCOUNTER — Other Ambulatory Visit: Payer: Self-pay | Admitting: Family Medicine

## 2011-02-07 ENCOUNTER — Other Ambulatory Visit: Payer: Self-pay | Admitting: Family Medicine

## 2011-02-13 HISTORY — PX: EYE SURGERY: SHX253

## 2011-02-20 ENCOUNTER — Encounter: Payer: Self-pay | Admitting: Family Medicine

## 2011-02-23 ENCOUNTER — Ambulatory Visit (INDEPENDENT_AMBULATORY_CARE_PROVIDER_SITE_OTHER): Payer: Medicare Other | Admitting: Family Medicine

## 2011-02-23 ENCOUNTER — Encounter: Payer: Self-pay | Admitting: Family Medicine

## 2011-02-23 VITALS — BP 130/72 | HR 69 | Resp 16 | Ht 62.0 in | Wt 129.4 lb

## 2011-02-23 DIAGNOSIS — E785 Hyperlipidemia, unspecified: Secondary | ICD-10-CM

## 2011-02-23 DIAGNOSIS — R5383 Other fatigue: Secondary | ICD-10-CM

## 2011-02-23 DIAGNOSIS — R002 Palpitations: Secondary | ICD-10-CM

## 2011-02-23 DIAGNOSIS — I10 Essential (primary) hypertension: Secondary | ICD-10-CM

## 2011-02-23 DIAGNOSIS — R5381 Other malaise: Secondary | ICD-10-CM

## 2011-02-23 MED ORDER — RAMIPRIL 10 MG PO CAPS: ORAL_CAPSULE | ORAL | Status: DC

## 2011-02-23 MED ORDER — SIMVASTATIN 10 MG PO TABS: ORAL_TABLET | ORAL | Status: DC

## 2011-02-23 NOTE — Patient Instructions (Signed)
CPE early February.  I am happy that you are feeling better, and that your eye surgery went well.   It is important that you exercise regularly at least 30 minutes 5 times a week. If you develop chest pain, have severe difficulty breathing, or feel very tired, stop exercising immediately and seek medical attention    No changes in medication.  Fasting labs end January or early February  Please do get a flu vaccine

## 2011-02-24 NOTE — Assessment & Plan Note (Signed)
Low fat diet encouraged and stressed, labs to be repeated

## 2011-02-24 NOTE — Assessment & Plan Note (Signed)
toprol added by cardiology, with good effect

## 2011-02-24 NOTE — Progress Notes (Signed)
  Subjective:    Patient ID: Rebecca Gutierrez, female    DOB: 1941/03/23, 70 y.o.   MRN: [id number]  HPI The PT is here for follow up and re-evaluation of chronic medical conditions, medication management and review of any available recent lab and radiology data.  Preventive health is updated, specifically  Cancer screening and Immunization.   Questions or concerns regarding consultations or procedures which the PT has had in the interim are  Addressed. States after she had evaluation by cardiology, she felt "much better", feels that the chest discomfort was aggravated by her using a riding mower to clear 2 acres, now using supportive pillow The PT denies any adverse reactions to current medications since the last visit.   Had successful left cataract surgery, right is to follow     Review of Systems    See HPI Denies recent fever or chills. Denies sinus pressure, nasal congestion, ear pain or sore throat. Denies chest congestion, productive cough or wheezing. Denies chest pains, palpitations and leg swelling Denies abdominal pain, nausea, vomiting,diarrhea or constipation.   Denies dysuria, frequency, hesitancy or incontinence. Denies joint pain, swelling and limitation in mobility. Denies headaches, seizures, numbness, or tingling. Denies depression, anxiety or insomnia.Though she filled xanax script she had requested , never took any after reading potential side effects. Denies skin break down or rash.     Objective:   Physical Exam Patient alert and oriented and in no cardiopulmonary distress.  HEENT: No facial asymmetry, EOMI, no sinus tenderness,  oropharynx pink and moist.  Neck supple no adenopathy.  Chest: Clear to auscultation bilaterally.  CVS: S1, S2 no murmurs, no S3.  ABD: Soft non tender. Bowel sounds normal.  Ext: No edema  MS: Adequate ROM spine, shoulders, hips and knees.  Skin: Intact, no ulcerations or rash noted.  Psych: Good eye contact, normal  affect. Memory intact not anxious or depressed appearing.  CNS: CN 2-12 intactsevere hearing loss, power, tone and sensation normal throughout.        Assessment & Plan:

## 2011-02-24 NOTE — Assessment & Plan Note (Signed)
Controlled, no change in medication  

## 2011-07-10 ENCOUNTER — Other Ambulatory Visit: Payer: Self-pay | Admitting: Family Medicine

## 2011-07-10 DIAGNOSIS — Z139 Encounter for screening, unspecified: Secondary | ICD-10-CM

## 2011-07-23 ENCOUNTER — Other Ambulatory Visit: Payer: Self-pay | Admitting: Family Medicine

## 2011-07-23 ENCOUNTER — Ambulatory Visit (HOSPITAL_COMMUNITY)
Admission: RE | Admit: 2011-07-23 | Discharge: 2011-07-23 | Disposition: A | Discharge status: Home / Self Care | Payer: MEDICARE | Source: Ambulatory Visit | Attending: Family Medicine | Admitting: Family Medicine

## 2011-07-23 DIAGNOSIS — Z1231 Encounter for screening mammogram for malignant neoplasm of breast: Secondary | ICD-10-CM | POA: Insufficient documentation

## 2011-07-23 DIAGNOSIS — Z139 Encounter for screening, unspecified: Secondary | ICD-10-CM

## 2011-07-24 LAB — HEPATIC FUNCTION PANEL
ALT: 19 U/L (ref 0–35)
Albumin: 4.5 g/dL (ref 3.5–5.2)
Total Protein: 6.7 g/dL (ref 6.0–8.3)

## 2011-07-24 LAB — LIPID PANEL
Cholesterol: 179 mg/dL (ref 0–200)
HDL: 69 mg/dL (ref >39)
LDL Cholesterol: 91 mg/dL (ref 0–99)
Total CHOL/HDL Ratio: 2.6 Ratio
Triglycerides: 96 mg/dL (ref <150)
VLDL: 19 mg/dL (ref 0–40)

## 2011-07-24 LAB — CBC WITH DIFFERENTIAL/PLATELET
Eosinophils Absolute: 0.1 10*3/uL (ref 0.0–0.7)
Lymphocytes Relative: 31 % (ref 12–46)
Lymphs Abs: 0.9 10*3/uL (ref 0.7–4.0)
MCH: 30.8 pg (ref 26.0–34.0)
Neutro Abs: 1.6 10*3/uL — ABNORMAL LOW (ref 1.7–7.7)
Neutrophils Relative %: 56 % (ref 43–77)
Platelets: 208 10*3/uL (ref 150–400)
RBC: 4.48 MIL/uL (ref 3.87–5.11)
WBC: 3 10*3/uL — ABNORMAL LOW (ref 4.0–10.5)

## 2011-07-24 LAB — HEMOGLOBIN A1C
Hgb A1c MFr Bld: 5.6 % (ref <5.7)
Mean Plasma Glucose: 114 mg/dL (ref <117)

## 2011-07-24 LAB — BASIC METABOLIC PANEL
BUN: 18 mg/dL (ref 6–23)
CO2: 25 mEq/L (ref 19–32)
Calcium: 9.4 mg/dL (ref 8.4–10.5)
Creat: 0.78 mg/dL (ref 0.50–1.10)
Glucose, Bld: 105 mg/dL — ABNORMAL HIGH (ref 70–99)

## 2011-07-28 ENCOUNTER — Ambulatory Visit (INDEPENDENT_AMBULATORY_CARE_PROVIDER_SITE_OTHER): Payer: Medicare Other | Admitting: Family Medicine

## 2011-07-28 ENCOUNTER — Encounter: Payer: Self-pay | Admitting: Family Medicine

## 2011-07-28 ENCOUNTER — Other Ambulatory Visit (HOSPITAL_COMMUNITY)
Admission: RE | Admit: 2011-07-28 | Discharge: 2011-07-28 | Disposition: A | Discharge status: Home / Self Care | Payer: MEDICARE | Source: Ambulatory Visit | Attending: Family Medicine | Admitting: Family Medicine

## 2011-07-28 DIAGNOSIS — Z01419 Encounter for gynecological examination (general) (routine) without abnormal findings: Secondary | ICD-10-CM | POA: Insufficient documentation

## 2011-07-28 DIAGNOSIS — Z124 Encounter for screening for malignant neoplasm of cervix: Secondary | ICD-10-CM

## 2011-07-28 DIAGNOSIS — E785 Hyperlipidemia, unspecified: Secondary | ICD-10-CM

## 2011-07-28 DIAGNOSIS — Z1211 Encounter for screening for malignant neoplasm of colon: Secondary | ICD-10-CM

## 2011-07-28 DIAGNOSIS — Z0000 Encounter for general adult medical examination without abnormal findings: Secondary | ICD-10-CM

## 2011-07-28 DIAGNOSIS — R5381 Other malaise: Secondary | ICD-10-CM

## 2011-07-28 DIAGNOSIS — I10 Essential (primary) hypertension: Secondary | ICD-10-CM

## 2011-07-28 NOTE — Assessment & Plan Note (Signed)
Controlled, no change in medication  

## 2011-07-28 NOTE — Progress Notes (Signed)
  Subjective:    Patient ID: Rebecca Gutierrez, female    DOB: 06-Jul-1940, 71 y.o.   MRN: [id number]  HPI The PT is here for annual exam  and re-evaluation of chronic medical conditions, medication management and review of any available recent lab and radiology data.  Preventive health is updated, specifically  Cancer screening and Immunization.   Questions or concerns regarding consultations or procedures which the PT has had in the interim are  addressed. The PT denies any adverse reactions to current medications since the last visit.  There are no new concerns.  There are no specific complaints       Review of Systems See HPI Denies recent fever or chills. Denies sinus pressure, nasal congestion, ear pain or sore throat. Denies chest congestion, productive cough or wheezing. Denies chest pains, palpitations and leg swelling Denies abdominal pain, nausea, vomiting,diarrhea or constipation.   Denies dysuria, frequency, hesitancy or incontinence. Denies joint pain, swelling and limitation in mobility. Denies headaches, seizures, numbness, or tingling. Denies depression, anxiety or insomnia. Denies skin break down or rash.        Objective:   Physical Exam   Pleasant well nourished female, alert and oriented x 3, in no cardio-pulmonary distress. Afebrile. HEENT No facial trauma or asymetry. Sinuses non tender.  EOMI, PERTL, fundoscopic exam is normal, no hemorhage or exudate.  External ears normal, tympanic membranes clear. Oropharynx moist, no exudate, good dentition. Neck: supple, no adenopathy,JVD or thyromegaly.No bruits.  Chest: Clear to ascultation bilaterally.No crackles or wheezes. Non tender to palpation  Breast: No asymetry,no masses. No nipple discharge or inversion. No axillary or supraclavicular adenopathy  Cardiovascular system; Heart sounds normal,  S1 and  S2 ,no S3.  No murmur, or thrill. Apical beat not displaced Peripheral pulses  normal.  Abdomen: Soft, non tender, no organomegaly or masses. No bruits. Bowel sounds normal. No guarding, tenderness or rebound.  Rectal:  No mass. Guaiac negative stool.  GU: External genitalia normal. No lesions. Vaginal canal normal.No discharge.Bladder prolapsed to introitus, unchanged Uterus normal size, no adnexal masses, no cervical motion or adnexal tenderness.  Musculoskeletal exam: Full ROM of spine, hips , shoulders and knees. No deformity ,swelling or crepitus noted. No muscle wasting or atrophy.   Neurologic: Cranial nerves 2 to 12 intact.Hearing loss, wears hearing aids Power, tone ,sensation and reflexes normal throughout. No disturbance in gait. No tremor.  Skin: Intact, no ulceration, erythema , scaling or rash noted. Pigmentation normal throughout  Psych; Normal mood and affect. Judgement and concentration normal      Assessment & Plan:

## 2011-07-28 NOTE — Patient Instructions (Signed)
cPE in 1 year.  Please call and come in if you need to see me before.  Pls cut back on sweets, your fasting blood sugar is higher than it should be. Cholesterol is improved, other labs are great   Fasting lipid and cmp and TSH in 5 months and 3 weeks.   Cbc, fasting cmp, hepatic in 12 months, will get this form later, may need to mail this  You are referred to Dr Karilyn Cota for a screening colonoscopy, his office will call this is important.

## 2011-08-03 ENCOUNTER — Encounter (INDEPENDENT_AMBULATORY_CARE_PROVIDER_SITE_OTHER): Payer: Self-pay | Admitting: *Deleted

## 2011-08-13 ENCOUNTER — Telehealth (INDEPENDENT_AMBULATORY_CARE_PROVIDER_SITE_OTHER): Payer: Self-pay | Admitting: *Deleted

## 2011-08-13 ENCOUNTER — Encounter (INDEPENDENT_AMBULATORY_CARE_PROVIDER_SITE_OTHER): Payer: Self-pay | Admitting: *Deleted

## 2011-08-13 ENCOUNTER — Other Ambulatory Visit (INDEPENDENT_AMBULATORY_CARE_PROVIDER_SITE_OTHER): Payer: Self-pay | Admitting: *Deleted

## 2011-08-13 DIAGNOSIS — Z1211 Encounter for screening for malignant neoplasm of colon: Secondary | ICD-10-CM

## 2011-08-13 MED ORDER — PEG-KCL-NACL-NASULF-NA ASC-C 100 G PO SOLR: 1.0000 | Freq: Once | ORAL | Status: DC

## 2011-08-13 NOTE — Telephone Encounter (Signed)
agree

## 2011-08-13 NOTE — Telephone Encounter (Signed)
Requesting MD/PCP:  simpson     Name & DOB: Rebecca Gutierrez  Aug 30, 2040     Procedure: TCS  Reason/Indication:  screening  Has patient had this procedure before?  yes  If so, when, by whom and where?  11 yrs ago  Is there a family history of colon cancer?  no  Who?  What age when diagnosed?    Is patient diabetic?   no      Does patient have prosthetic heart valve?  no  Do you have a pacemaker?  no  Has patient had joint replacement within last 12 months?  no  Is patient on Coumadin, Plavix and/or Aspirin? yes  Medications: asa 81 mg daily, altace 10 mg daily, hctz 25 mg daily, metoprolol 25 mg bid, zocor 10 mg daily, calcium, oscal  Allergies: bactrim  [facility]  Medication Adjustment: asa 2 days  Procedure date & time: 08/28/11 at 930

## 2011-08-27 ENCOUNTER — Encounter (HOSPITAL_COMMUNITY): Payer: Self-pay | Admitting: Pharmacy Technician

## 2011-08-27 MED ORDER — SODIUM CHLORIDE 0.45 % IV SOLN
Freq: Once | INTRAVENOUS | Status: AC
  Administered 2011-08-28: 09:00:00 via INTRAVENOUS

## 2011-08-28 ENCOUNTER — Encounter (HOSPITAL_COMMUNITY): Payer: Self-pay | Admitting: *Deleted

## 2011-08-28 ENCOUNTER — Ambulatory Visit (HOSPITAL_COMMUNITY)
Admission: RE | Admit: 2011-08-28 | Discharge: 2011-08-28 | Disposition: A | Discharge status: Home Health | Payer: MEDICARE | Source: Ambulatory Visit | Attending: Internal Medicine | Admitting: Internal Medicine

## 2011-08-28 ENCOUNTER — Encounter (HOSPITAL_COMMUNITY)
Admission: RE | Disposition: A | Discharge status: Home Health | Payer: Self-pay | Source: Ambulatory Visit | Attending: Internal Medicine

## 2011-08-28 DIAGNOSIS — K5520 Angiodysplasia of colon without hemorrhage: Secondary | ICD-10-CM | POA: Insufficient documentation

## 2011-08-28 DIAGNOSIS — Z1211 Encounter for screening for malignant neoplasm of colon: Secondary | ICD-10-CM

## 2011-08-28 DIAGNOSIS — K5521 Angiodysplasia of colon with hemorrhage: Secondary | ICD-10-CM

## 2011-08-28 DIAGNOSIS — K6389 Other specified diseases of intestine: Secondary | ICD-10-CM

## 2011-08-28 DIAGNOSIS — K644 Residual hemorrhoidal skin tags: Secondary | ICD-10-CM

## 2011-08-28 DIAGNOSIS — Z7982 Long term (current) use of aspirin: Secondary | ICD-10-CM | POA: Insufficient documentation

## 2011-08-28 DIAGNOSIS — Z79899 Other long term (current) drug therapy: Secondary | ICD-10-CM | POA: Insufficient documentation

## 2011-08-28 DIAGNOSIS — D126 Benign neoplasm of colon, unspecified: Secondary | ICD-10-CM | POA: Insufficient documentation

## 2011-08-28 DIAGNOSIS — I10 Essential (primary) hypertension: Secondary | ICD-10-CM | POA: Insufficient documentation

## 2011-08-28 DIAGNOSIS — E785 Hyperlipidemia, unspecified: Secondary | ICD-10-CM | POA: Insufficient documentation

## 2011-08-28 HISTORY — PX: COLONOSCOPY: SHX5424

## 2011-08-28 SURGERY — COLONOSCOPY
Anesthesia: Moderate Sedation

## 2011-08-28 MED ORDER — MIDAZOLAM HCL 5 MG/5ML IJ SOLN
INTRAMUSCULAR | Status: AC
  Filled 2011-08-28: qty 10

## 2011-08-28 MED ORDER — MEPERIDINE HCL 50 MG/ML IJ SOLN
INTRAMUSCULAR | Status: AC
  Filled 2011-08-28: qty 1

## 2011-08-28 MED ORDER — MIDAZOLAM HCL 5 MG/5ML IJ SOLN
INTRAMUSCULAR | Status: DC | PRN
  Administered 2011-08-28: 1 mg via INTRAVENOUS
  Administered 2011-08-28 (×2): 2 mg via INTRAVENOUS

## 2011-08-28 MED ORDER — MIDAZOLAM HCL 5 MG/5ML IJ SOLN
INTRAMUSCULAR | Status: AC
  Filled 2011-08-28: qty 5

## 2011-08-28 MED ORDER — MEPERIDINE HCL 50 MG/ML IJ SOLN
INTRAMUSCULAR | Status: DC | PRN
  Administered 2011-08-28 (×2): 25 mg via INTRAVENOUS

## 2011-08-28 MED ORDER — STERILE WATER FOR IRRIGATION IR SOLN
Status: DC | PRN
  Administered 2011-08-28: 10:00:00

## 2011-08-28 NOTE — Op Note (Signed)
COLONOSCOPY PROCEDURE REPORT  PATIENT:  Rebecca Gutierrez  MR#:  [id number] Birthdate:  1940-06-27, 71 y.o., female Endoscopist:  Dr. Malissa Hippo, MD Referred By:  Dr. Milus Mallick. Lodema Hong, MD Procedure Date: 08/28/2011  Procedure:   Colonoscopy  Indications: Patient is a 71 year old Caucasian female who is undergoing intravenous screening colonoscopy.  Informed Consent:  The procedure and risks were reviewed with the patient and informed consent was obtained.  Medications:  Demerol 50 mg IV Versed 5 mg IV  Description of procedure:  After a digital rectal exam was performed, that colonoscope was advanced from the anus through the rectum and colon to the area of the cecum, ileocecal valve and appendiceal orifice. The cecum was deeply intubated. These structures were well-seen and photographed for the record. From the level of the cecum and ileocecal valve, the scope was slowly and cautiously withdrawn. The mucosal surfaces were carefully surveyed utilizing scope tip to flexion to facilitate fold flattening as needed. The scope was pulled down into the rectum where a thorough exam including retroflexion was performed.  Findings:   Prep excellent. 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 4-5 mm polyp at mid sigmoid colon ablated via cold biopsy. Small signal in the patella with hemorrhoids below the dentate line.  Therapeutic/Diagnostic Maneuvers Performed:  See above  Complications:  None  Cecal Withdrawal Time:  14 minutes  Impression:  Examination performed to cecum. Small nonbleeding cecal AV malformation; it was left alone. Small polyp ablated via cold biopsy from sigmoid colon. Single in a papilla with small external hemorrhoids.  Recommendations:  Standard instructions given. I will be contacting patient with results of biopsy and further recommendations.  Leaira Fullam U  08/28/2011 10:29 AM  CC: Dr. Syliva Overman, MD, MD & Dr. Bonnetta Barry ref. provider found

## 2011-08-28 NOTE — H&P (Signed)
Rebecca Gutierrez is an 71 y.o. female.   Chief Complaint: Patient is here for colonoscopy. HPI: Patient is a 71 year old Caucasian female who is here for average risk screening colonoscopy. She denies rectal bleeding, abdominal pain or diarrhea. Patient's last exam was 11 years ago. Family history is negative for colorectal carcinoma.  Past Medical History  Diagnosis Date  . Hypertension   . Hearing loss   . Gilbert's syndrome   . Hyperlipidemia     Past Surgical History  Procedure Date  . Tubal ligation   . Eye surgery 02/13/2011    , right cataract extraction    Family History  Problem Relation Age of Onset  . Colon cancer Neg Hx    Social History:  reports that she has never smoked. She does not have any smokeless tobacco history on file. She reports that she does not drink alcohol or use illicit drugs.  Allergies:  Allergies  Allergen Reactions  . Sulfamethoxazole W/Trimethoprim     Medications Prior to Admission  Medication Dose Route Frequency Provider Last Rate Last Dose  . 0.45 % sodium chloride infusion   Intravenous Once Malissa Hippo, MD 20 mL/hr at 08/28/11 1610     Medications Prior to Admission  Medication Sig Dispense Refill  . aspirin EC 81 MG tablet Take 81 mg by mouth at bedtime.       . metoprolol tartrate (LOPRESSOR) 25 MG tablet Take 25 mg by mouth 2 (two) times daily.        . ramipril (ALTACE) 10 MG capsule TAKE 1 CAPSULE DAILY  90 capsule  1  . simvastatin (ZOCOR) 10 MG tablet TAKE 1 TABLET AT BEDTIME  90 tablet  1  . hydrochlorothiazide 25 MG tablet Take 1 tablet (25 mg total) by mouth daily.  90 tablet  0    No results found for this or any previous visit (from the past 48 hour(s)). No results found.  Review of Systems  Constitutional: Negative for weight loss.  Gastrointestinal: Negative for abdominal pain, diarrhea, constipation, blood in stool and melena.    Blood pressure 198/96, pulse 81, temperature 98 F (36.7 C), temperature  source Oral, resp. rate 18, height 5\' 2"  (1.575 m), weight 128 lb (58.06 kg), SpO2 97.00%. Physical Exam  Constitutional: She appears well-developed and well-nourished.  HENT:  Mouth/Throat: Oropharynx is clear and moist.  Eyes: Conjunctivae are normal. No scleral icterus.  Neck: No thyromegaly present.  Cardiovascular: Normal rate, regular rhythm and normal heart sounds.   No murmur heard. Respiratory: Effort normal and breath sounds normal.  GI: Soft. She exhibits no distension and no mass. There is no tenderness.  Musculoskeletal: She exhibits no edema.  Lymphadenopathy:    She has no cervical adenopathy.  Neurological: She is alert.  Skin: Skin is warm and dry.     Assessment/Plan Average risk screening colonoscopy.  Rebecca Gutierrez 08/28/2011, 9:58 AM

## 2011-08-28 NOTE — Discharge Instructions (Addendum)
Resume usual diet and medications. No driving for 24 hours. Physician will contact you with biopsy results.  Colon Polyps A polyp is extra tissue that grows inside your body. Colon polyps grow in the large intestine. The large intestine, also called the colon, is part of your digestive system. It is a long, hollow tube at the end of your digestive tract where your body makes and stores stool. Most polyps are not dangerous. They are benign. This means they are not cancerous. But over time, some types of polyps can turn into cancer. Polyps that are smaller than a pea are usually not harmful. But larger polyps could someday become or may already be cancerous. To be safe, doctors remove all polyps and test them.  WHO GETS POLYPS? Anyone can get polyps, but certain people are more likely than others. You may have a greater chance of getting polyps if:  You are over 50.   You have had polyps before.   Someone in your family has had polyps.   Someone in your family has had cancer of the large intestine.   Find out if someone in your family has had polyps. You may also be more likely to get polyps if you:   Eat a lot of fatty foods.   Smoke.   Drink alcohol.   Do not exercise.   Eat too much.  SYMPTOMS  Most small polyps do not cause symptoms. People often do not know they have one until their caregiver finds it during a regular checkup or while testing them for something else. Some people do have symptoms like these:  Bleeding from the anus. You might notice blood on your underwear or on toilet paper after you have had a bowel movement.   Constipation or diarrhea that lasts more than a week.   Blood in the stool. Blood can make stool look black or it can show up as red streaks in the stool.  If you have any of these symptoms, see your caregiver. HOW DOES THE DOCTOR TEST FOR POLYPS? The doctor can use four tests to check for polyps:  Digital rectal exam. The caregiver wears gloves  and checks your rectum (the last part of the large intestine) to see if it feels normal. This test would find polyps only in the rectum. Your caregiver may need to do one of the other tests listed below to find polyps higher up in the intestine.   Barium enema. The caregiver puts a liquid called barium into your rectum before taking x-rays of your large intestine. Barium makes your intestine look white in the pictures. Polyps are dark, so they are easy to see.   Sigmoidoscopy. With this test, the caregiver can see inside your large intestine. A thin flexible tube is placed into your rectum. The device is called a sigmoidoscope, which has a light and a tiny video camera in it. The caregiver uses the sigmoidoscope to look at the last third of your large intestine.   Colonoscopy. This test is like sigmoidoscopy, but the caregiver looks at all of the large intestine. It usually requires sedation. This is the most common method for finding and removing polyps.  TREATMENT   The caregiver will remove the polyp during sigmoidoscopy or colonoscopy. The polyp is then tested for cancer.   If you have had polyps, your caregiver may want you to get tested regularly in the future.  PREVENTION  There is not one sure way to prevent polyps. You might be able  to lower your risk of getting them if you:  Eat more fruits and vegetables and less fatty food.   Do not smoke.   Avoid alcohol.   Exercise every day.   Lose weight if you are overweight.   Eating more calcium and folate can also lower your risk of getting polyps. Some foods that are rich in calcium are milk, cheese, and broccoli. Some foods that are rich in folate are chickpeas, kidney beans, and spinach.   Aspirin might help prevent polyps. Studies are under way.  Document Released: 02/26/2004 Document Revised: 05/21/2011 Document Reviewed: 08/03/2007 [facility] Patient Information 2012 [place], [place].  Colonoscopy Care After Read the  instructions outlined below and refer to this sheet in the next few weeks. These discharge instructions provide you with general information on caring for yourself after you leave the hospital. Your doctor may also give you specific instructions. While your treatment has been planned according to the most current medical practices available, unavoidable complications occasionally occur. If you have any problems or questions after discharge, call your doctor. HOME CARE INSTRUCTIONS ACTIVITY:  You may resume your regular activity, but move at a slower pace for the next 24 hours.   Take frequent rest periods for the next 24 hours.   Walking will help get rid of the air and reduce the bloated feeling in your belly (abdomen).   No driving for 24 hours (because of the medicine (anesthesia) used during the test).   You may shower.   Do not sign any important legal documents or operate any machinery for 24 hours (because of the anesthesia used during the test).  NUTRITION:  Drink plenty of fluids.   You may resume your normal diet as instructed by your doctor.   Begin with a light meal and progress to your normal diet. Heavy or fried foods are harder to digest and may make you feel sick to your stomach (nauseated).   Avoid alcoholic beverages for 24 hours or as instructed.  MEDICATIONS:  You may resume your normal medications unless your doctor tells you otherwise.  WHAT TO EXPECT TODAY:  Some feelings of bloating in the abdomen.   Passage of more gas than usual.   Spotting of blood in your stool or on the toilet paper.  IF YOU HAD POLYPS REMOVED DURING THE COLONOSCOPY:  No aspirin products for 7 days or as instructed.   No alcohol for 7 days or as instructed.   Eat a soft diet for the next 24 hours.  FINDING OUT THE RESULTS OF YOUR TEST Not all test results are available during your visit. If your test results are not back during the visit, make an appointment with your caregiver  to find out the results. Do not assume everything is normal if you have not heard from your caregiver or the medical facility. It is important for you to follow up on all of your test results.  SEEK IMMEDIATE MEDICAL CARE IF:  You have more than a spotting of blood in your stool.   Your belly is swollen (abdominal distention).   You are nauseated or vomiting.   You have a fever.   You have abdominal pain or discomfort that is severe or gets worse throughout the day.  Document Released: 01/14/2004 Document Revised: 05/21/2011 Document Reviewed: 01/12/2008 [facility] Patient Information 2012 [place], [place].

## 2011-09-01 ENCOUNTER — Encounter (HOSPITAL_COMMUNITY): Payer: Self-pay | Admitting: Internal Medicine

## 2011-09-07 ENCOUNTER — Encounter (INDEPENDENT_AMBULATORY_CARE_PROVIDER_SITE_OTHER): Payer: Self-pay | Admitting: *Deleted

## 2011-09-12 ENCOUNTER — Other Ambulatory Visit: Payer: Self-pay | Admitting: Family Medicine

## 2011-10-12 ENCOUNTER — Encounter: Payer: Self-pay | Admitting: Family Medicine

## 2011-10-12 ENCOUNTER — Ambulatory Visit (INDEPENDENT_AMBULATORY_CARE_PROVIDER_SITE_OTHER): Payer: BC Managed Care – HMO | Admitting: Family Medicine

## 2011-10-12 VITALS — BP 124/78 | HR 73 | Resp 16 | Ht 62.0 in | Wt 132.0 lb

## 2011-10-12 DIAGNOSIS — L02611 Cutaneous abscess of right foot: Secondary | ICD-10-CM | POA: Insufficient documentation

## 2011-10-12 DIAGNOSIS — E785 Hyperlipidemia, unspecified: Secondary | ICD-10-CM

## 2011-10-12 DIAGNOSIS — I10 Essential (primary) hypertension: Secondary | ICD-10-CM

## 2011-10-12 DIAGNOSIS — L03031 Cellulitis of right toe: Secondary | ICD-10-CM | POA: Insufficient documentation

## 2011-10-12 DIAGNOSIS — L02619 Cutaneous abscess of unspecified foot: Secondary | ICD-10-CM

## 2011-10-12 MED ORDER — DOXYCYCLINE HYCLATE 100 MG PO TABS: 100.0000 mg | ORAL_TABLET | Freq: Two times a day (BID) | ORAL | Status: AC

## 2011-10-12 NOTE — Patient Instructions (Signed)
F/u as before.  You have infection of the skin under your left great . It is vital you take entire course of medication for 10 days.  You should start feeling better in 3 days, call if worsening.  Please keep area clean and dry, stay off foot as much as possible tiill better.  Apply dry gauze loosely over area for protection when up and about, ok to soak twice daily in salt water  , dry thoroughly after.  Ok to leave to air when off feet

## 2011-10-12 NOTE — Progress Notes (Signed)
  Subjective:    Patient ID: Rebecca Gutierrez, female    DOB: 05-Feb-1941, 71 y.o.   MRN: [id number]  HPI 6 day h/o pain under right great toe , feels as though something in the toe which is now red and swollen. Pt states got progressively worse, till 1 day ago she "popped a blister" with a pin. No fever, direct pressure as in walking is painful   Review of Systems See HPI Denies recent fever or chills. Denies sinus pressure, nasal congestion, ear pain or sore throat. Denies chest congestion, productive cough or wheezing. Denies chest pains, palpitations and leg swelling C/o toe pain, swelling and limitation in mobility.      Objective:   Physical Exam Patient alert and oriented and in no cardiopulmonary distress.  HEENT: No facial asymmetry, EOMI, no sinus tenderness,  oropharynx pink and moist.  Neck supple no adenopathy.  Chest: Clear to auscultation bilaterally.  CVS: S1, S2 no murmurs, no S3.  ABD: Soft non tender.  Ext: No edema  MS: Adequate ROM spine, shoulders, hips and knees.Decreased ROM right great toe which is swollen and red  Skin: open blister in crease under left great toe, with erythema surrounding and warmth, diameter approx 2.5cm      Assessment & Plan:

## 2011-10-13 ENCOUNTER — Emergency Department: Payer: Self-pay | Admitting: Emergency Medicine

## 2011-10-13 LAB — CBC
HCT: 41.1 % (ref 35.0–47.0)
HGB: 14.4 g/dL (ref 12.0–16.0)
MCH: 31.8 pg (ref 26.0–34.0)
MCHC: 35 g/dL (ref 32.0–36.0)
Platelet: 224 10*3/uL (ref 150–440)
RBC: 4.52 10*6/uL (ref 3.80–5.20)
RDW: 13.3 % (ref 11.5–14.5)
WBC: 4.9 10*3/uL (ref 3.6–11.0)

## 2011-10-13 LAB — COMPREHENSIVE METABOLIC PANEL
BUN: 16 mg/dL (ref 7–18)
Calcium, Total: 9.4 mg/dL (ref 8.5–10.1)
Chloride: 103 mmol/L (ref 98–107)
Co2: 33 mmol/L — ABNORMAL HIGH (ref 21–32)
EGFR (African American): >60
Glucose: 113 mg/dL — ABNORMAL HIGH (ref 65–99)
Osmolality: 285 (ref 275–301)
Potassium: 3.8 mmol/L (ref 3.5–5.1)

## 2011-10-13 NOTE — Assessment & Plan Note (Signed)
cleamed with saline, dry gauze applied and antibiotics prescribed

## 2011-10-13 NOTE — Assessment & Plan Note (Signed)
Controlled, no change in medication  

## 2011-10-16 ENCOUNTER — Other Ambulatory Visit: Payer: Self-pay | Admitting: Family Medicine

## 2011-10-24 ENCOUNTER — Other Ambulatory Visit: Payer: Self-pay | Admitting: Family Medicine

## 2011-12-11 ENCOUNTER — Other Ambulatory Visit: Payer: Self-pay | Admitting: Family Medicine

## 2012-02-11 LAB — COMPLETE METABOLIC PANEL WITH GFR
AST: 19 U/L (ref 0–37)
Albumin: 4.2 g/dL (ref 3.5–5.2)
Alkaline Phosphatase: 47 U/L (ref 39–117)
BUN: 21 mg/dL (ref 6–23)
Calcium: 9.5 mg/dL (ref 8.4–10.5)
Chloride: 103 mEq/L (ref 96–112)
Glucose, Bld: 94 mg/dL (ref 70–99)
Potassium: 3.8 mEq/L (ref 3.5–5.3)
Sodium: 141 mEq/L (ref 135–145)
Total Protein: 6.5 g/dL (ref 6.0–8.3)

## 2012-02-11 LAB — LIPID PANEL
Cholesterol: 217 mg/dL — ABNORMAL HIGH (ref 0–200)
Total CHOL/HDL Ratio: 2.8 Ratio
VLDL: 13 mg/dL (ref 0–40)

## 2012-03-21 ENCOUNTER — Other Ambulatory Visit: Payer: Self-pay | Admitting: Family Medicine

## 2012-05-18 ENCOUNTER — Other Ambulatory Visit: Payer: Self-pay | Admitting: Family Medicine

## 2012-05-31 ENCOUNTER — Telehealth: Payer: Self-pay | Admitting: Family Medicine

## 2012-05-31 NOTE — Telephone Encounter (Signed)
Called patient to advise urgent care but no answer - left message

## 2012-05-31 NOTE — Telephone Encounter (Signed)
Patient was evaluated in the ED this am

## 2012-06-03 ENCOUNTER — Encounter: Payer: Self-pay | Admitting: Family Medicine

## 2012-06-03 ENCOUNTER — Ambulatory Visit (INDEPENDENT_AMBULATORY_CARE_PROVIDER_SITE_OTHER): Payer: BC Managed Care – HMO | Admitting: Family Medicine

## 2012-06-03 VITALS — BP 160/90 | HR 96 | Resp 16 | Ht 62.0 in | Wt 128.0 lb

## 2012-06-03 DIAGNOSIS — I10 Essential (primary) hypertension: Secondary | ICD-10-CM

## 2012-06-03 DIAGNOSIS — R002 Palpitations: Secondary | ICD-10-CM

## 2012-06-03 DIAGNOSIS — E785 Hyperlipidemia, unspecified: Secondary | ICD-10-CM

## 2012-06-03 MED ORDER — SIMVASTATIN 10 MG PO TABS: ORAL_TABLET | ORAL | Status: DC

## 2012-06-03 NOTE — Progress Notes (Signed)
  Subjective:    Patient ID: Rebecca Gutierrez, female    DOB: 1940-10-21, 71 y.o.   MRN: [id number]  HPI Pt in today stating that since December 11, she has been noticing episodes of shaking , anxitey, palpitations intermittently, worse in supine position and at  Times she has had her systolic  blood pressure over 782.  Went to the eD on 12/17  Because of symptoms, then went to he son's house that Tues and returned just yesterday, states she had no similar episodes while a his home. Denies anxiety, or new stress, however since the unexpected loss of her spouse she has had similar type episodes, last was palpitations , for which she was evaluated by a cardiologist. States she was on metoprolol up until the Summer of this year for the problem, but in he review, when sh reported pulse of 58, it was decided to discontinue the med by her cardiologist. Prior to this she has been doing well and has no other concerns. Denies recent fever , chills or signs of infection, however , states that when she has "the episodes" she "gets the shakes, and has to curl up"   Review of Systems See HPI Denies recent fever or chills. Denies sinus pressure, nasal congestion, ear pain or sore throat. Denies chest congestion, productive cough or wheezing. Denies chest pains, PND or orthopnea and leg swelling Denies abdominal pain, nausea, vomiting,diarrhea or constipation.   Denies dysuria, frequency, hesitancy or incontinence. Denies joint pain, swelling and limitation in mobility. Denies headaches, seizures, numbness, or tingling. Denies depression, or insomnia. Denies skin break down or rash.        Objective:   Physical Exam Patient alert and oriented and in no cardiopulmonary distress.anxious  HEENT: No facial asymmetry, EOMI, no sinus tenderness,  oropharynx pink and moist.  Neck supple no adenopathy.  Chest: Clear to auscultation bilaterally.  CVS: S1, S2 no murmurs, no S3.  ABD: Soft non tender.  Bowel sounds normal.  Ext: No edema  MS: Adequate ROM spine, shoulders, hips and knees.  Skin: Intact, no ulcerations or rash noted.  Psych: Good eye contact, normal affect. Memory intact not anxious or depressed appearing.  NFA:OZHYQMV loss, otherwise cranial nerves are intact,power, tone and sensation normal throughout.        Assessment & Plan:

## 2012-06-03 NOTE — Patient Instructions (Addendum)
F/U in 3 month  Please start tihe toprol today we will give  The first here, take 12 hours apart

## 2012-06-04 NOTE — Assessment & Plan Note (Signed)
Uncontrolled, pt to resume metoprolol and continue the others, first given at [facility] , also referred to cardiology for re evalaution

## 2012-06-04 NOTE — Assessment & Plan Note (Signed)
Uncontrolled in Summer Hyperlipidemia:Low fat diet discussed and encouraged.  Updated labs for next visit

## 2012-06-15 HISTORY — PX: ABDOMINAL HYSTERECTOMY: SHX81

## 2012-07-15 ENCOUNTER — Telehealth: Payer: Self-pay | Admitting: Family Medicine

## 2012-07-15 DIAGNOSIS — R5381 Other malaise: Secondary | ICD-10-CM

## 2012-07-15 DIAGNOSIS — I10 Essential (primary) hypertension: Secondary | ICD-10-CM

## 2012-07-15 DIAGNOSIS — E785 Hyperlipidemia, unspecified: Secondary | ICD-10-CM

## 2012-07-15 NOTE — Telephone Encounter (Signed)
Will mail

## 2012-07-26 ENCOUNTER — Other Ambulatory Visit: Payer: Self-pay | Admitting: Family Medicine

## 2012-07-26 DIAGNOSIS — Z139 Encounter for screening, unspecified: Secondary | ICD-10-CM

## 2012-07-28 ENCOUNTER — Encounter: Payer: Self-pay | Admitting: Family Medicine

## 2012-08-02 ENCOUNTER — Ambulatory Visit (HOSPITAL_COMMUNITY)
Admission: RE | Admit: 2012-08-02 | Discharge: 2012-08-02 | Disposition: A | Discharge status: Home / Self Care | Payer: MEDICARE | Source: Ambulatory Visit | Attending: Family Medicine | Admitting: Family Medicine

## 2012-08-02 DIAGNOSIS — Z1231 Encounter for screening mammogram for malignant neoplasm of breast: Secondary | ICD-10-CM | POA: Insufficient documentation

## 2012-08-02 DIAGNOSIS — Z139 Encounter for screening, unspecified: Secondary | ICD-10-CM

## 2012-08-02 LAB — COMPLETE METABOLIC PANEL WITH GFR
AST: 18 U/L (ref 0–37)
Albumin: 4.4 g/dL (ref 3.5–5.2)
Alkaline Phosphatase: 54 U/L (ref 39–117)
Potassium: 3.9 mEq/L (ref 3.5–5.3)
Sodium: 142 mEq/L (ref 135–145)
Total Bilirubin: 1.4 mg/dL — ABNORMAL HIGH (ref 0.3–1.2)
Total Protein: 6.5 g/dL (ref 6.0–8.3)

## 2012-08-02 LAB — CBC WITH DIFFERENTIAL/PLATELET
Basophils Absolute: 0 10*3/uL (ref 0.0–0.1)
Basophils Relative: 1 % (ref 0–1)
MCHC: 34.9 g/dL (ref 30.0–36.0)
Neutro Abs: 1.9 10*3/uL (ref 1.7–7.7)
Neutrophils Relative %: 58 % (ref 43–77)
Platelets: 195 10*3/uL (ref 150–400)
RDW: 13.5 % (ref 11.5–15.5)

## 2012-08-11 ENCOUNTER — Ambulatory Visit (INDEPENDENT_AMBULATORY_CARE_PROVIDER_SITE_OTHER): Payer: BC Managed Care – HMO | Admitting: Family Medicine

## 2012-08-11 ENCOUNTER — Encounter: Payer: Self-pay | Admitting: Family Medicine

## 2012-08-11 VITALS — BP 120/70 | HR 81 | Resp 16 | Ht 62.0 in | Wt 131.1 lb

## 2012-08-11 DIAGNOSIS — N3000 Acute cystitis without hematuria: Secondary | ICD-10-CM

## 2012-08-11 DIAGNOSIS — R7301 Impaired fasting glucose: Secondary | ICD-10-CM

## 2012-08-11 DIAGNOSIS — I10 Essential (primary) hypertension: Secondary | ICD-10-CM

## 2012-08-11 DIAGNOSIS — R002 Palpitations: Secondary | ICD-10-CM

## 2012-08-11 DIAGNOSIS — E785 Hyperlipidemia, unspecified: Secondary | ICD-10-CM

## 2012-08-11 DIAGNOSIS — N390 Urinary tract infection, site not specified: Secondary | ICD-10-CM

## 2012-08-11 DIAGNOSIS — R5381 Other malaise: Secondary | ICD-10-CM

## 2012-08-11 DIAGNOSIS — R5383 Other fatigue: Secondary | ICD-10-CM

## 2012-08-11 DIAGNOSIS — N329 Bladder disorder, unspecified: Secondary | ICD-10-CM

## 2012-08-11 DIAGNOSIS — Z1211 Encounter for screening for malignant neoplasm of colon: Secondary | ICD-10-CM

## 2012-08-11 LAB — POCT URINALYSIS DIPSTICK
Bilirubin, UA: NEGATIVE
Glucose, UA: NEGATIVE
Ketones, UA: NEGATIVE
pH, UA: 6

## 2012-08-11 MED ORDER — CIPROFLOXACIN HCL 500 MG PO TABS: 500.0000 mg | ORAL_TABLET | Freq: Two times a day (BID) | ORAL | Status: DC

## 2012-08-11 NOTE — Patient Instructions (Signed)
Annual wellness in mid October, [place] call if you need me before.  Labs are excellent as is your blood pressure  You appear to have a urinary tract infection, ciprofloxacin for 5 days is prescribed , you will be called if urine sensitivity shows this wiln not be apprpriate next Monday  Ensure you drink water often,. 60 ounces per day, and void often.  Please call if you wish to be re evaluated for prolapse,I will refer.  Feel free to contact your insurance company to see how often they will pay for pa smears, you do not need one this year based on your current history It is important that you exercise regularly at least 30 minutes 5 times a week. If you develop chest pain, have severe difficulty breathing, or feel very tired, stop exercising immediately and seek medical attention    A healthy diet is rich in fruit, vegetables and whole grains. Poultry fish, nuts and beans are a healthy choice for protein rather then red meat. A low sodium diet and drinking 64 ounces of water daily is generally recommended. Oils and sweet should be limited. Snacks should be primarily fruits, vegetables or nuts.   Start daily calcium with D as discussed you need this for your bones   Fasting lipid , cmp and TSH in October before follow up

## 2012-08-11 NOTE — Progress Notes (Signed)
  Subjective:    Patient ID: Rebecca Gutierrez, female    DOB: [birth year], 72 y.o.   MRN: [id number]  HPI The PT is here for follow up and re-evaluation of chronic medical conditions, medication management and review of any available recent lab and radiology data.  Preventive health is updated, specifically  Cancer screening and Immunization.   Questions or concerns regarding consultations or procedures which the PT has had in the interim are  Addressed.has had full cardiology eval since her last visit, and this was negative for significant disease The PT denies any adverse reactions to current medications since the last visit.  There are no new concerns.  C/o frequency and dysuria in the past month, sh does hav significant bladder prolapse      Review of Systems See HPI Denies recent fever or chills. Denies sinus pressure, nasal congestion, ear pain or sore throat. Denies chest congestion, productive cough or wheezing. Denies chest pains, palpitations and leg swelling Denies abdominal pain, nausea, vomiting,diarrhea or constipation.   Denies joint pain, swelling and limitation in mobility. Denies headaches, seizures, numbness, or tingling. Denies depression, anxiety or insomnia.Believes that palpitations she was recently experiencing were related to stress and anxiety associated with sale of he parent's estate Denies skin break down or rash.        Objective:   Physical Exam Patient alert and oriented and in no cardiopulmonary distress.  HEENT: No facial asymmetry, EOMI, no sinus tenderness,  oropharynx pink and moist.  Neck supple no adenopathy.  Chest: Clear to auscultation bilaterally.  CVS: S1, S2 no murmurs, no S3.  ABD: Soft non tender. Bowel sounds normal. Rectal: no mas, heme negative stool Ext: No edema  MS: Adequate ROM spine, shoulders, hips and knees.  Skin: Intact, no ulcerations or rash noted.  Psych: Good eye contact, normal affect. Memory intact not  anxious or depressed appearing.  CNS: CN 2-12 intact,except for significant hearing loss, power, tone and sensation normal throughout.        Assessment & Plan:

## 2012-08-13 LAB — URINE CULTURE: Organism ID, Bacteria: NO GROWTH

## 2012-08-15 NOTE — Assessment & Plan Note (Signed)
Cardiology eval negative for significant disease, palpitations appear to be anxiety driven

## 2012-08-15 NOTE — Assessment & Plan Note (Signed)
High risk of UTI, symtomatic and abn UA will start antibiotic and f/u c/s

## 2012-08-15 NOTE — Assessment & Plan Note (Signed)
Hyperlipidemia:Low fat diet discussed and encouraged.  Controlled, no change in medication   

## 2012-08-15 NOTE — Assessment & Plan Note (Signed)
Controlled, no change in medication DASH diet and commitment to daily physical activity for a minimum of 30 minutes discussed and encouraged, as a part of hypertension management. The importance of attaining a healthy weight is also discussed.  

## 2012-08-15 NOTE — Assessment & Plan Note (Signed)
No interest in pessary or surgical intervention at this time, will call if she changes  Her mind

## 2012-08-16 LAB — POC HEMOCCULT BLD/STL (OFFICE/1-CARD/DIAGNOSTIC): Fecal Occult Blood, POC: NEGATIVE

## 2012-08-16 NOTE — Addendum Note (Signed)
Addended by: Abner Greenspan on: 08/16/2012 12:00 PM   Modules accepted: Orders

## 2012-09-02 ENCOUNTER — Other Ambulatory Visit: Payer: Self-pay | Admitting: Family Medicine

## 2012-09-02 ENCOUNTER — Telehealth: Payer: Self-pay

## 2012-09-02 DIAGNOSIS — N814 Uterovaginal prolapse, unspecified: Secondary | ICD-10-CM

## 2012-09-02 NOTE — Telephone Encounter (Signed)
She wants to go to Dr Emelda Fear at [facility]

## 2012-09-02 NOTE — Telephone Encounter (Signed)
pls refer specifically to Dr Emelda Fear, per pt request, to eval and treat uterine prolase

## 2012-09-02 NOTE — Telephone Encounter (Signed)
Thought she was being referred to OB but when followed up on, nothing is in the workqueue. Please refer

## 2012-09-02 NOTE — Telephone Encounter (Signed)
No number readily available. I think this is about r prolapse, which she was advised to call back if wanted surgical eval, if this is the case I will put in the referral to gyne of her choice, has seen someone in [place] before, let me know if she wants to return there, she will need to give [name] the name or if she prefers a provider nearer, [place], [place], [place], [place]. Please let me knwo

## 2012-09-04 ENCOUNTER — Other Ambulatory Visit: Payer: Self-pay | Admitting: Family Medicine

## 2012-09-07 NOTE — Telephone Encounter (Signed)
Pt was called and dr. Emelda Fear office will call her with appt and time

## 2012-09-19 ENCOUNTER — Ambulatory Visit (INDEPENDENT_AMBULATORY_CARE_PROVIDER_SITE_OTHER): Payer: BC Managed Care – HMO | Admitting: Obstetrics and Gynecology

## 2012-09-19 ENCOUNTER — Encounter: Payer: Self-pay | Admitting: Obstetrics and Gynecology

## 2012-09-19 VITALS — BP 144/72 | Ht 62.0 in | Wt 130.8 lb

## 2012-09-19 DIAGNOSIS — Z1212 Encounter for screening for malignant neoplasm of rectum: Secondary | ICD-10-CM

## 2012-09-19 DIAGNOSIS — N814 Uterovaginal prolapse, unspecified: Secondary | ICD-10-CM

## 2012-09-19 LAB — HEMOCCULT GUIAC POC 1CARD (OFFICE): Fecal Occult Blood, POC: NEGATIVE

## 2012-09-19 NOTE — Progress Notes (Signed)
Rebecca Gutierrez is an 72 y.o. female. Seen for a bulge that is not going away, causing vulvar irritation and soreness when walking.   Pertinent Gynecological History: Last pap: normal Date: 2013 OB History: G2, P2   Menstrual History: Menarche age:  No LMP recorded. Patient is postmenopausal.    Past Medical History  Diagnosis Date  . Hypertension   . Hearing loss   . Gilbert's syndrome   . Hyperlipidemia     Past Surgical History  Procedure Laterality Date  . Tubal ligation    . Eye surgery  02/13/2011    , right cataract extraction  . Colonoscopy  08/28/2011    Procedure: COLONOSCOPY;  Surgeon: Malissa Hippo, MD;  Location: [facility];  Service: Endoscopy;  Laterality: N/A;  930    Family History  Problem Relation Age of Onset  . Colon cancer Neg Hx   . Hypertension Mother     Social History:  reports that she has never smoked. She does not have any smokeless tobacco history on file. She reports that she does not drink alcohol or use illicit drugs.  Allergies:  Allergies  Allergen Reactions  . Sulfamethoxazole W-Trimethoprim      (Not in a hospital admission)  ROSslwed stream  Blood pressure 144/72, height 5\' 2"  (1.575 m), weight 130 lb 12.8 oz (59.33 kg). Physical Exam  Physical Examination: General appearance - alert, well appearing, and in no distress and normal appearing weight Abdomen - soft, nontender, nondistended, no masses or organomegaly Pelvic - VULVA: vulvar dystrophy of post fourchette         Huge cystocele, 6 x 6 x 10 cm on ultrasound transvaginal (bladder measurements)  [id number]::normal appearing vagina with normal color and discharge, no lesions}, CERVIX: normal appearing cervix without discharge or lesions, UTERUS: uterus is normal size, shape, consistency and nontender, ADNEXA: normal adnexa in size, nontender and no masses, RECTAL: normal rectal, no masses, rectocele noted hemoccult neg Extremities - peripheral pulses normal, no  pedal edema, no clubbing or cyanosis  No results found.  Assessment/Plan: Large cystocele protruding past parous introitus. Second-degree uterine descensus. Incomplete voiding due to cystocele   Plan: Good with 2.5  Gelhorn pessary, with prescription given to patient. She was taken throughout apothecary Followup 1-2 weeks for fitting  Saber Dickerman V 09/19/2012, 2:37 PM

## 2012-09-19 NOTE — Patient Instructions (Signed)
Take prescription to [facility] for filling return 1-3 weeks for examination and pessary fitting

## 2012-10-03 ENCOUNTER — Ambulatory Visit: Payer: BC Managed Care – HMO | Admitting: Obstetrics and Gynecology

## 2012-10-24 ENCOUNTER — Ambulatory Visit (INDEPENDENT_AMBULATORY_CARE_PROVIDER_SITE_OTHER): Payer: BC Managed Care – HMO | Admitting: Obstetrics and Gynecology

## 2012-10-24 ENCOUNTER — Encounter: Payer: Self-pay | Admitting: Obstetrics and Gynecology

## 2012-10-24 VITALS — BP 140/72 | Ht 62.0 in | Wt 129.6 lb

## 2012-10-24 DIAGNOSIS — N814 Uterovaginal prolapse, unspecified: Secondary | ICD-10-CM

## 2012-10-24 DIAGNOSIS — N329 Bladder disorder, unspecified: Secondary | ICD-10-CM

## 2012-10-24 NOTE — Progress Notes (Signed)
GYNECOLOGY CLINIC PROGRESS NOTE  The patient presented today for a pessary insert. She reports no vaginal bleeding or discharge. She denies pelvic discomfort and difficulty urinating or moving her bowels. She completed her vacation without difficulty.  ?The patient's 72 pessary wasinserted without complications.  normal vaginal mucosa with no lesions or lacerations. The patient should return in 3 months for a pessary check . She plans to pursue surgery this fall.

## 2012-12-03 ENCOUNTER — Other Ambulatory Visit: Payer: Self-pay | Admitting: Family Medicine

## 2013-01-20 ENCOUNTER — Other Ambulatory Visit: Payer: Self-pay | Admitting: Family Medicine

## 2013-01-30 ENCOUNTER — Ambulatory Visit (INDEPENDENT_AMBULATORY_CARE_PROVIDER_SITE_OTHER): Payer: BC Managed Care – HMO | Admitting: Obstetrics and Gynecology

## 2013-01-30 ENCOUNTER — Encounter: Payer: Self-pay | Admitting: Obstetrics and Gynecology

## 2013-01-30 VITALS — BP 158/84 | Ht 62.0 in | Wt 131.4 lb

## 2013-01-30 DIAGNOSIS — R350 Frequency of micturition: Secondary | ICD-10-CM

## 2013-01-30 LAB — POCT URINALYSIS DIPSTICK
Blood, UA: NEGATIVE
Glucose, UA: NEGATIVE

## 2013-01-30 NOTE — Addendum Note (Signed)
Addended by: Criss Alvine on: 01/30/2013 10:13 AM   Modules accepted: Orders

## 2013-01-30 NOTE — Progress Notes (Signed)
Patient ID: Rebecca Gutierrez, female   DOB: [birth year], 71 y.o.   MRN: [id number] Pt here today to follow up with pessary and c/o urinary frequency.    [facility] Visit  Patient name: Rebecca Gutierrez MRN [id number]  Date of birth: September 05, 1940  CC & HPI:  Rebecca Gutierrez is a 72 y.o. female presenting today for f/u ;cystocele, and ? Re :pap frequency. SEes dr Lodema Hong , normal pap 2012,2013  ROS:  occas uti sx. occas llq discomfort, no fevers, quit pessary use due to retention while using [employer] pessary.  Pertinent History Reviewed:  Medical & Surgical Hx:  Reviewed: Significant for good overall health. [place]  Primary care Medications: Reviewed & Updated - see associated section Social History: Reviewed -  reports that she has never smoked. She does not have any smokeless tobacco history on file.  Objective Findings:  Vitals: BP 158/84  Ht 5\' 2"  (1.575 m)  Wt 131 lb 6.4 oz (59.603 kg)  BMI 24.03 kg/m2  Physical Examination: General appearance - alert, well appearing, and in no distress and normal appearing weight Mental status - alert, oriented to person, place, and time Abdomen - soft, nontender, nondistended, no masses or organomegaly Pelvic - VULVA: vulvar hyperpigmentation 2 mm at post fourchette, bruising vs pigmented lesion,  VAGINA: normal appearing vagina with normal color and discharge, no lesions, PELVIC FLOOR EXAM: cystocele grade 2,  CERVIX: normal appearing cervix without discharge or lesions, UTERUS: uterus is normal size, shape, consistency and nontender, ADNEXA: normal adnexa in size, nontender and no masses   Assessment & Plan:   Cystocele , stable, Pigmented lesion post fourchette  Plan : reck lesion 6 wk Consider repair cystocele prn increased discomfort. uti

## 2013-01-30 NOTE — Patient Instructions (Signed)
Prolapse  Prolapse means the falling down, bulging, dropping, or drooping of a body part. Organs that commonly prolapse include the rectum, small intestine, bladder, urethra, vagina (birth canal), uterus (womb), and cervix. Prolapse occurs when the ligaments and muscle tissue around the rectum, bladder, and uterus are damaged or weakened.  CAUSES  This happens especially with:  Childbirth. Some women feel pelvic pressure or have trouble holding their urine right after childbirth, because of stretching and tearing of pelvic tissues. This generally gets better with time and the feeling usually goes away, but it may return with aging.  Chronic heavy lifting.  Aging.  Menopause, with loss of estrogen production weakening the pelvic ligaments and muscles.  Past pelvic surgery.  Obesity.  Chronic constipation.  Chronic cough. Prolapse may affect a single organ, or several organs may prolapse at the same time. The front wall of the vagina holds up the bladder. The back wall holds up part of the lower intestine, or rectum. The uterus fills a spot in the middle. All these organs can be involved when the ligaments and muscles around the vagina relax too much. This often gets worse when women stop producing estrogen (menopause). SYMPTOMS  Uncontrolled loss of urine (incontinence) with cough, sneeze, straining, and exercise.  More force may be required to have a bowel movement, due to trapping of the stool.  When part of an organ bulges through the opening of the vagina, there is sometimes a feeling of heaviness or pressure. It may feel as though something is falling out. This sensation increases with coughing or bearing down.  If the organs protrude through the opening of the vagina and rub against the clothing, there may be soreness, ulcers, infection, pain, and bleeding.  Lower back pain.  Pushing in the upper or lower part of the vagina, to pass urine or have a bowel movement.  Problems  having sexual intercourse.  Being unable to insert a tampon or applicator. DIAGNOSIS  Usually, a physical exam is all that is needed to identify the problem. During the examination, you may be asked to cough and strain while lying down, sitting up, and standing up. Your caregiver will determine if more testing is required, such as bladder function tests. Some diagnoses are:  Cystocele: Bulging and falling of the bladder into the top of the vagina.  Rectocele: Part of the rectum bulging into the vagina.  Prolapse of the uterus: The uterus falls or drops into the vagina.  Enterocele: Bulging of the top of the vagina, after a hysterectomy (uterus removal), with the small intestine bulging into the vagina. A hernia in the top of the vagina.  Urethrocele: The urethra (urine carrying tube) bulging into the vagina. TREATMENT  In most cases, prolapse needs to be treated only if it produces symptoms. If the symptoms are interfering with your usual daily or sexual activities, treatment may be necessary. The following are some measures that may be used to treat prolapse.  Estrogen may help elderly women with mild prolapse.  Kegel exercises may help mild cases of prolapse, by strengthening and tightening the muscles of the pelvic floor.  Pessaries are used in women who choose not to, or are unable to, have surgery. A pessary is a doughnut-shaped piece of plastic or rubber that is put into the vagina to keep the organs in place. This device must be fitted by your caregiver. Your caregiver will also explain how to care for yourself with the pessary. If it works well for you,   this may be the only treatment required.  Surgery is often the only form of treatment for more severe prolapses. There are different types of surgery available. You should discuss what the best procedure is for you. If the uterus is prolapsed, it may be removed (hysterectomy) as part of the surgical treatment. Your caregiver will  discuss the risks and benefits with you.  Uterine-vaginal suspension (surgery to hold up the organs) may be used, especially if you want to maintain your fertility. No form of treatment is guaranteed to correct the prolapse or relieve the symptoms. HOME CARE INSTRUCTIONS   Wear a sanitary pad or absorbent product if you have incontinence of urine.  Avoid heavy lifting and straining with exercise and work.  Take over-the-counter pain medicine for minor discomfort.  Try taking estrogen or using estrogen vaginal cream.  Try Kegel exercises or use a pessary, before deciding to have surgery.  Do Kegel exercises after having a baby. SEEK MEDICAL CARE IF:   Your symptoms interfere with your daily activities.  You need medicine to help with the discomfort.  You need to be fitted with a pessary.  You notice bleeding from the vagina.  You think you have ulcers or you notice ulcers on the cervix.  You have an oral temperature above 102 F (38.9 C).  You develop pain or blood with urination.  You have bleeding with a bowel movement.  The symptoms are interfering with your sex life.  You have urinary incontinence that interferes with your daily activities.  You lose urine with sexual intercourse.  You have a chronic cough.  You have chronic constipation. Document Released: 12/06/2002 Document Revised: 08/24/2011 Document Reviewed: 06/16/2009 [employer] Patient Information 2014 [employer].  

## 2013-01-31 LAB — URINALYSIS, ROUTINE W REFLEX MICROSCOPIC
Hgb urine dipstick: NEGATIVE
Leukocytes, UA: NEGATIVE
Nitrite: NEGATIVE
Specific Gravity, Urine: 1.016 (ref 1.005–1.030)
Urobilinogen, UA: 0.2 mg/dL (ref 0.0–1.0)
pH: 6.5 (ref 5.0–8.0)

## 2013-01-31 LAB — URINE CULTURE
Colony Count: NO GROWTH
Organism ID, Bacteria: NO GROWTH

## 2013-02-07 ENCOUNTER — Telehealth: Payer: Self-pay | Admitting: Family Medicine

## 2013-02-07 MED ORDER — SIMVASTATIN 10 MG PO TABS: ORAL_TABLET | ORAL | Status: DC

## 2013-02-07 NOTE — Telephone Encounter (Signed)
[facility], next visit in Oct

## 2013-03-01 ENCOUNTER — Ambulatory Visit (INDEPENDENT_AMBULATORY_CARE_PROVIDER_SITE_OTHER): Payer: BC Managed Care – HMO | Admitting: Family Medicine

## 2013-03-01 ENCOUNTER — Encounter: Payer: Self-pay | Admitting: Family Medicine

## 2013-03-01 VITALS — BP 160/86 | HR 63 | Resp 16 | Wt 132.8 lb

## 2013-03-01 DIAGNOSIS — R102 Pelvic and perineal pain: Secondary | ICD-10-CM | POA: Insufficient documentation

## 2013-03-01 DIAGNOSIS — E785 Hyperlipidemia, unspecified: Secondary | ICD-10-CM

## 2013-03-01 DIAGNOSIS — R002 Palpitations: Secondary | ICD-10-CM

## 2013-03-01 DIAGNOSIS — Z23 Encounter for immunization: Secondary | ICD-10-CM

## 2013-03-01 DIAGNOSIS — N390 Urinary tract infection, site not specified: Secondary | ICD-10-CM

## 2013-03-01 DIAGNOSIS — F411 Generalized anxiety disorder: Secondary | ICD-10-CM

## 2013-03-01 DIAGNOSIS — N949 Unspecified condition associated with female genital organs and menstrual cycle: Secondary | ICD-10-CM

## 2013-03-01 DIAGNOSIS — R1900 Intra-abdominal and pelvic swelling, mass and lump, unspecified site: Secondary | ICD-10-CM | POA: Insufficient documentation

## 2013-03-01 DIAGNOSIS — I10 Essential (primary) hypertension: Secondary | ICD-10-CM

## 2013-03-01 LAB — POCT URINALYSIS DIPSTICK
Bilirubin, UA: NEGATIVE
Ketones, UA: NEGATIVE
Spec Grav, UA: 1.02
pH, UA: 7

## 2013-03-01 NOTE — Patient Instructions (Addendum)
Annual wellness visit end November or early December.  Flu vaccine today  Fasting lipid, cmp and tSH , in the next week, DISCARD previous lab order please  You are referred for pelvic ultrasound to evaluate pelvic pain and fullness since July  Blood pressure is a bit higher than desired today , this will be re evaluated at next visit also.  PLEASE bring ALL medication to the visit

## 2013-03-01 NOTE — Progress Notes (Signed)
  Subjective:    Patient ID: Rebecca Gutierrez, female    DOB: 07/24/1940, 72 y.o.   MRN: [id number]  HPI The PT is here for follow up and re-evaluation of chronic medical conditions, medication management and review of any available recent lab and radiology data.  Preventive health is updated, specifically  Cancer screening and Immunization.   Questions or concerns regarding consultations or procedures which the PT has had in the interim are  Addressed.Has been seeing Dr Emelda Fear for bladder prolapse and pessaries have not been working, and she may need surgical intervention. No change in bowel movement 5 days ago she had acute back pain , which has gradually lessened, she does have known disc disease Desribes intermittent "dull headaches" which she gets when she has infection so she has been worried abouta uTI, today again negative The PT denies any adverse reactions to current medications since the last visit.        Review of Systems See HPI Denies recent fever or chills. Denies sinus pressure, nasal congestion, ear pain or sore throat. Denies chest congestion, productive cough or wheezing. Denies chest pains, palpitations and leg swelling Denies nausea, vomiting,diarrhea or constipation.low pelvic pain espescailly on the left for 2 month, feels as if  There is "something there"  C/o dysuria, frequency, mild for the past 2 days , and "dull headache" as if she has infection Recent low back pain this past weekend , resolved with 2 aleve, she does have disc disease Denies , seizures, numbness, or tingling. Denies depression, anxiety or insomnia.h/o panic attack with palpitations Denies skin break down or rash.        Objective:   Physical Exam Patient alert and oriented and in no cardiopulmonary distress.  HEENT: No facial asymmetry, EOMI, no sinus tenderness,  oropharynx pink and moist.  Neck supple no adenopathy.  Chest: Clear to auscultation bilaterally.  CVS: S1, S2 no  murmurs, no S3.  ABD: Soft non tender mild bilateral lower quadrant tenderness to deep palpation, right greater than left, no guarding or rebound. No palpable organomegaly or masses. Bowel sounds normal. Pelvic: uterus normal sized, left adnexal tenderness  With questionable mass, normal vaginal d/c , right adnex no tenderness or mass palpated Ext: No edema  MS: Adequate ROM spine, shoulders, hips and knees.  Skin: Intact, no ulcerations or rash noted.  Psych: Good eye contact, normal affect. Memory intact not anxious or depressed appearing.  CNS: CN 2-12 intact, power, tone and sensation normal throughout.        Assessment & Plan:

## 2013-03-02 ENCOUNTER — Telehealth: Payer: Self-pay

## 2013-03-02 ENCOUNTER — Other Ambulatory Visit: Payer: Self-pay | Admitting: Family Medicine

## 2013-03-02 MED ORDER — CLONAZEPAM 0.5 MG PO TABS: ORAL_TABLET | ORAL | Status: DC

## 2013-03-02 NOTE — Telephone Encounter (Signed)
pls let her know I wrote ten only klonopin for use for panic, explain may make her sleepy so cannot take and drive Also let her know that at next visit if BP is high I would like to change one of her Bp meds from HCTZ to maxzide , which is similar , but a stronger med, willing to do it now if she agrees esp in light of tele call, PLS let me know She will need 2 month f/u if she changes now

## 2013-03-02 NOTE — Telephone Encounter (Signed)
She would like to wait because she thinks it may be her nerves from worrying about her abdoninal scan and wants to see what it is when she comes back

## 2013-03-02 NOTE — Telephone Encounter (Signed)
Rebecca Gutierrez called and stated her BP got up last night to 198/95 and she got the shakes and had to call 911. She thinks it was a panic attack and wants something incase this happens again for as needed use for her nerves. Coming this am to resubmit urine for C/S

## 2013-03-05 DIAGNOSIS — F411 Generalized anxiety disorder: Secondary | ICD-10-CM | POA: Insufficient documentation

## 2013-03-05 DIAGNOSIS — N390 Urinary tract infection, site not specified: Secondary | ICD-10-CM | POA: Insufficient documentation

## 2013-03-05 NOTE — Assessment & Plan Note (Signed)
Cardiology evaluation earlier this year negative for significant abnormality

## 2013-03-05 NOTE — Assessment & Plan Note (Signed)
Frequency  With mild discomfort, pt states she has recently been having headache as she does with infection. Ua mildsly abn, unfortunately missed at visit, however specimen submitted the following day for c/s only

## 2013-03-05 NOTE — Assessment & Plan Note (Signed)
2 month history, questionable left adnexal fullness, will refer for pelvic US

## 2013-03-05 NOTE — Assessment & Plan Note (Signed)
Mild anxiety with intermittent h/o panic attacks, primarily at night, causing palpitations and increased Bp, limited amt of klonopin for as needed use prescribed, the day following the visit, when pt called in stating that the previous night she had an attack, needed to call 911, and BP was markedly elevated at the time

## 2013-03-05 NOTE — Assessment & Plan Note (Signed)
Uncontrolled, would benefit from higher dose of medication but elects to delay this. DASH diet and commitment to daily physical activity for a minimum of 30 minutes discussed and encouraged, as a part of hypertension management. The importance of attaining a healthy weight is also discussed.

## 2013-03-05 NOTE — Assessment & Plan Note (Signed)
Hyperlipidemia:Low fat diet discussed and encouraged.  Updated lab prior to next visit

## 2013-03-06 ENCOUNTER — Ambulatory Visit (HOSPITAL_COMMUNITY): Payer: MEDICARE

## 2013-03-06 ENCOUNTER — Ambulatory Visit (HOSPITAL_COMMUNITY)
Admission: RE | Admit: 2013-03-06 | Discharge: 2013-03-06 | Disposition: A | Discharge status: Home / Self Care | Payer: MEDICARE | Source: Ambulatory Visit | Attending: Family Medicine | Admitting: Family Medicine

## 2013-03-06 DIAGNOSIS — R102 Pelvic and perineal pain: Secondary | ICD-10-CM

## 2013-03-06 DIAGNOSIS — D259 Leiomyoma of uterus, unspecified: Secondary | ICD-10-CM | POA: Insufficient documentation

## 2013-03-06 DIAGNOSIS — N949 Unspecified condition associated with female genital organs and menstrual cycle: Secondary | ICD-10-CM | POA: Insufficient documentation

## 2013-03-08 LAB — LIPID PANEL
Cholesterol: 174 mg/dL (ref 0–200)
HDL: 73 mg/dL (ref >39)
Total CHOL/HDL Ratio: 2.4 Ratio
VLDL: 21 mg/dL (ref 0–40)

## 2013-03-08 LAB — COMPREHENSIVE METABOLIC PANEL
AST: 18 U/L (ref 0–37)
Alkaline Phosphatase: 51 U/L (ref 39–117)
BUN: 18 mg/dL (ref 6–23)
Glucose, Bld: 111 mg/dL — ABNORMAL HIGH (ref 70–99)
Sodium: 142 mEq/L (ref 135–145)
Total Bilirubin: 1.9 mg/dL — ABNORMAL HIGH (ref 0.3–1.2)

## 2013-03-08 NOTE — Addendum Note (Signed)
Addended by: Abner Greenspan on: 03/08/2013 01:32 PM   Modules accepted: Orders

## 2013-03-10 ENCOUNTER — Other Ambulatory Visit: Payer: Self-pay

## 2013-03-10 ENCOUNTER — Other Ambulatory Visit: Payer: Self-pay | Admitting: Family Medicine

## 2013-03-10 LAB — URINE CULTURE: Colony Count: >=100000

## 2013-03-10 MED ORDER — CIPROFLOXACIN HCL 500 MG PO TABS: 500.0000 mg | ORAL_TABLET | Freq: Two times a day (BID) | ORAL | Status: DC

## 2013-03-15 ENCOUNTER — Ambulatory Visit (INDEPENDENT_AMBULATORY_CARE_PROVIDER_SITE_OTHER): Payer: BC Managed Care – HMO | Admitting: Obstetrics and Gynecology

## 2013-03-15 ENCOUNTER — Encounter: Payer: Self-pay | Admitting: Obstetrics and Gynecology

## 2013-03-15 VITALS — BP 150/82 | Ht 62.0 in | Wt 131.0 lb

## 2013-03-15 DIAGNOSIS — N814 Uterovaginal prolapse, unspecified: Secondary | ICD-10-CM

## 2013-03-15 DIAGNOSIS — N8189 Other female genital prolapse: Secondary | ICD-10-CM

## 2013-03-15 DIAGNOSIS — N8111 Cystocele, midline: Secondary | ICD-10-CM

## 2013-03-15 DIAGNOSIS — N8110 Cystocele, unspecified: Secondary | ICD-10-CM

## 2013-03-15 NOTE — Progress Notes (Signed)
Patient ID: Rebecca Gutierrez, female   DOB: 10-Nov-1940, 72 y.o.   MRN: [id number] Pt here today to follow up on ovarian cyst and uterine prolapse. Pt denies any pain, just has a achy feeling.   Tearfully anxious re legs shaky at nite , several times recently, Discussed with Dr Lodema Hong.   [facility] Visit  Patient name: Rebecca Gutierrez MRN [id number]  Date of birth: [birth year]  CC & HPI:  Rebecca Gutierrez is a 72 y.o. female presenting today for  1. Scared over shaky legs at night, has called 911. Has happened 6 times ,  Has tried klonipin recently from [employer]   ROS:  Vague sense of LLQ discomfort in past. Had neg u/s last wk pelvis tv,tabdominal.  Pertinent History Reviewed:  Medical & Surgical Hx:  Reviewed: Significant for widowed x 3 yr. Did not have RLS when at sons house recently Medications: Reviewed & Updated - see associated section Social History: Reviewed -  reports that she has never smoked. She has never used smokeless tobacco.  Objective Findings:  Vitals: BP 150/82  Ht 5\' 2"  (1.575 m)  Wt 131 lb (59.421 kg)  BMI 23.95 kg/m2  Physical Examination: General appearance - alert, well appearing, and in no distress and anxious Mental status - alert, oriented to person, place, and time, normal mood, behavior, speech, dress, motor activity, and thought processes Abdomen - soft, nontender, nondistended, no masses or organomegaly no rebound tenderness noted Pelvic - VULVA: normal appearing vulva with no masses, tenderness or lesions, VAGINA: normal appearing vagina with normal color and discharge, no lesions, atrophic, PELVIC FLOOR EXAM: no cystocele, rectocele or prolapse noted, cystocele to introitus , CERVIX: normal appearing cervix without discharge or lesions, UTERUS: uterus is normal size, shape, consistency and nontender, mobile, mild grade 1 descent, ADNEXA: normal adnexa in size, nontender and no masses, RECTAL: normal rectal, no masses, denies defecation  probs.   Assessment & Plan:  Pelvic relaxation Persistent Cystocele Grade 1 ut descensus Minimal rectocele No adx masses. PROBABLE RESTLESS LEG SYND.

## 2013-03-15 NOTE — Patient Instructions (Addendum)
PLEASE DISCUSS THE LEG MOVEMENTS WITH dR SIMPSON, THIS MAY BE 'RESTLESS LEG SYNDROME" FOR WITH THERE ARE MEDICINES SUCH AS REQUIP.  YOUR CYSTOCELE REMAINS LARGE, BUT STABLE. YOU ARE PROBABLY NOT COMPLETELY VOIDING, AND YOU MAY GET BLADDER INFECTIONS DUE TO RETAINED URINE. iF THAT BEGINS TO HAPPEN YOU SHOULD CONSIDER HAVING THE CYSTOCELE REPAIRED Cystocele Repair A cystocele is a bulging, drooping hernia or break (rupture) of bladder tissue into the birth canal (vagina). This bulging or rupture occurs on the top front wall of the vagina. CAUSES  Cystocele is associated with weakness of the top front wall of the vagina due to stretching and tearing of the ligaments and muscles in the area. This is often the result of:  Multiple childbirths.  Continuous heavy lifting.  Chronic cough from asthma, emphysema, or smoking.  Being overweight.  Changes from aging.  Previous surgery in the vaginal area.  Menopause with loss of estrogen hormone and weakening of the ligaments and muscles around the bladder. SYMPTOMS   Uncontrolled loss of urine (incontinence) with cough, sneeze, or exercise.  Pelvic pressure.  Frequency or urgency to urinate because of inability to completely empty the bladder.  Bladder infections.  Needing to push on the upper vagina to help yourself pass urine. DIAGNOSIS  A cystocele can be diagnosed by doing a pelvic exam and observing the top of the vagina drooping or bulging into or out of the vagina. TREATMENT  Surgical options:  Cystocele repair is surgery that removes the hernia.  There are also different "sling" operations that may be used. Discuss the different types of surgeries to repair a cystocele with your caregiver. Your caregiver will decide what type of surgery will be best in your case. Nonsurgical options:  Kegel exercises. This helps strengthen and tighten the muscles and tissue in and around the bladder and vagina. This may help with mild cases of  cystocele.  A pessary may help the cystocele. A pessary is a plastic or rubber device that lifts the bladder into place. A pessary must be fitted by a doctor.  Tampons or diaphragms that lift the bladder into place are sometimes helpful with a minor or small cystocele.  Estrogen may help with mild cases in menopausal and aging women. LET YOUR CAREGIVER KNOW ABOUT:   Allergies to food or medicine.  Medicines taken, including vitamins, herbs, eyedrops, over-the-counter medicines, and creams.  Use of steroids (by mouth or creams).  Previous problems with anesthetics or numbing medicines.  History of bleeding problems or blood clots.  Previous surgery.  Other health problems, including diabetes and kidney problems.  Possibility of pregnancy, if this applies. RISKS AND COMPLICATIONS  All surgery is associated with risks.  There are risks with a general anesthesia. You should discuss this with your caregiver.  With spinal or epidural anesthesia, there may be an area that is not numbed, and you could feel pain.  Headache could occur with a spinal or epidural anesthetic.  The catheter you will have after surgery may not work properly or may get blocked and need to be replaced.  Excessive bleeding.  Infection.  Injury to surrounding structures.  Recurrence of the cystocele.  Surgery may not get rid of your symptoms. BEFORE THE PROCEDURE   Do not take aspirin or blood thinners for 1 week prior to surgery, unless instructed otherwise.  Do not eat or drink anything after midnight the night before surgery.  Let your caregiver know if you develop a cold or other infectious problems prior  to surgery.  If being admitted the day of surgery, you should be present 1 hour prior to your procedure or as directed by your caregiver.  Plan and arrange for help when you go home from the hospital.  If you smoke, do not smoke for at least 2 weeks before the surgery.  Do not drink any  alcohol for 3 days before the surgery. PROCEDURE  You will be given an anesthetic to prevent you from feeling pain during surgery. This may be a general anesthetic that puts you to sleep, or a spinal or epidural anesthetic. You will be asleep or be numbed through the entire procedure. During cystocele repair, tissue is pulled from the sides and around the top of the vagina to lift up the hernia. This removes the hernia so that the top of the vagina does not fall into the opening of the vagina. AFTER THE PROCEDURE  After surgery, you will be taken to the recovery room where a nurse will take care of you, checking your breathing, blood pressure, pulse, and your progress. When your caregiver feels you are stable, you will be taken to your room. You will have a drainage tube (Foley catheter) that will drain your bladder for 2 to 7 days or longer, until your bladder is working properly. This catheter is placed prior to surgery to help keep your bladder empty and out of the way during the procedure. After surgery, this will make passing your urine easier. The catheter will be removed when you can easily pass urine without this assistance. You may have gauze packing in the vagina that will be removed 1 to 2 days after the surgery. Usually, you will be given a medicine (antibiotic) that kills germs. You will be given pain medicine as needed. You can usually go home in 3 to 5 days. HOME CARE INSTRUCTIONS   Do not take baths. Take showers until your caregiver informs you otherwise.  Take antibiotics as directed by your caregiver.  Exercise as instructed. Do not perform exercises which increase the pressure inside your belly (abdomen), such as sit-ups or lifting weights, until your caregiver has given permission. Walking exercise is preferred.  Only take over-the-counter or prescription medicines for pain and discomfort as directed by your caregiver.  Do not drink alcohol while taking pain medicine.  Do not  lift anything over 5 pounds.  Do not drive until your caregiver gives you permission.  Get plenty of rest and sleep.  Have someone help with your household chores for 1 to 2 weeks.  If you develop constipation, you may take a mild laxative with your caregiver's permission. Eating bran foods and drinking enough water and fluids to keep your urine clear or pale yellow helps with constipation.  Do not take aspirin. It may cause bleeding.  You may resume normal diet and unstrenuous activities as directed.  Do not douche, use tampons, or engage in intercourse until your surgeon has given permission.  Change bandages (dressings) as directed.  Make and keep all your postoperative appointments. SEEK MEDICAL CARE IF:   You have abnormal vaginal discharge.  You develop a rash.  You are having a reaction to your medicine.  You develop nausea or vomiting. SEEK IMMEDIATE MEDICAL CARE IF:   You have redness, swelling, or increasing pain in the vaginal area.  You notice pus coming from the vagina.  You have a fever.  You notice a bad smell coming from the vagina.  You have increasing abdominal pain.  You have frequent urination or you notice burning during urination.  You notice blood in your urine.  You have excessive vaginal bleeding.  You cannot urinate. MAKE SURE YOU:   Understand these instructions.  Will watch your condition.  Will get help right away if you are not doing well or get worse. Document Released: 05/29/2000 Document Revised: 08/24/2011 Document Reviewed: 08/29/2009 [facility] Patient Information 2014 [place], [place]. Restless Legs Syndrome Restless legs syndrome is a movement disorder. It may also be called a sensori-motor disorder.  CAUSES  No one knows what specifically causes restless legs syndrome, but it tends to run in families. It is also more common in people with low iron, in pregnancy, in people who need dialysis, and those with nerve damage  (neuropathy).Some medications may make restless legs syndrome worse.Those medications include drugs to treat high blood pressure, some heart conditions, nausea, colds, allergies, and depression. SYMPTOMS Symptoms include uncomfortable sensations in the legs. These leg sensations are worse during periods of inactivity or rest. They are also worse while sitting or lying down. Individuals that have the disorder describe sensations in the legs that feel like:  Pulling.  Drawing.  Crawling.  Worming.  Boring.  Tingling.  Pins and needles.  Prickling.  Pain. The sensations are usually accompanied by an overwhelming urge to move the legs. Sudden muscle jerks may also occur. Movement provides temporary relief from the discomfort. In rare cases, the arms may also be affected. Symptoms may interfere with going to sleep (sleep onset insomnia). Restless legs syndrome may also be related to periodic limb movement disorder (PLMD). PLMD is another more common motor disorder. It also causes interrupted sleep. The symptoms from PLMD usually occur most often when you are awake. TREATMENT  Treatment for restless legs syndrome is symptomatic. This means that the symptoms are treated.   Massage and cold compresses may provide temporary relief.  Walk, stretch, or take a cold or hot bath.  Get regular exercise and a good night's sleep.  Avoid caffeine, alcohol, nicotine, and medications that can make it worse.  Do activities that provide mental stimulation like discussions, needlework, and video games. These may be helpful if you are not able to walk or stretch. Some medications are effective in relieving the symptoms. However, many of these medications have side effects. Ask your caregiver about medications that may help your symptoms. Correcting iron deficiency may improve symptoms for some patients. Document Released: 05/22/2002 Document Revised: 08/24/2011 Document Reviewed: 08/28/2010 [facility]  Patient Information 2014 [place], [place]. GO TO WEB MD FOR FURTHER INFO

## 2013-03-21 ENCOUNTER — Encounter: Payer: Self-pay | Admitting: Family Medicine

## 2013-03-21 ENCOUNTER — Ambulatory Visit (INDEPENDENT_AMBULATORY_CARE_PROVIDER_SITE_OTHER): Payer: BC Managed Care – HMO | Admitting: Family Medicine

## 2013-03-21 VITALS — BP 160/84 | HR 70 | Resp 18 | Wt 131.1 lb

## 2013-03-21 DIAGNOSIS — F5105 Insomnia due to other mental disorder: Secondary | ICD-10-CM

## 2013-03-21 DIAGNOSIS — F489 Nonpsychotic mental disorder, unspecified: Secondary | ICD-10-CM

## 2013-03-21 DIAGNOSIS — I10 Essential (primary) hypertension: Secondary | ICD-10-CM

## 2013-03-21 DIAGNOSIS — F409 Phobic anxiety disorder, unspecified: Secondary | ICD-10-CM

## 2013-03-21 DIAGNOSIS — E785 Hyperlipidemia, unspecified: Secondary | ICD-10-CM

## 2013-03-21 MED ORDER — TEMAZEPAM 7.5 MG PO CAPS: 7.5000 mg | ORAL_CAPSULE | Freq: Every evening | ORAL | Status: DC | PRN

## 2013-03-21 NOTE — Patient Instructions (Addendum)
Annual wellness as before   New medication for help with sleep, call if no better in 2 days  I strongly advise that you get pelvic surgery as soon as is convenient

## 2013-03-22 ENCOUNTER — Other Ambulatory Visit: Payer: Self-pay | Admitting: Family Medicine

## 2013-03-22 ENCOUNTER — Telehealth: Payer: Self-pay | Admitting: Family Medicine

## 2013-03-22 ENCOUNTER — Ambulatory Visit: Payer: BC Managed Care – HMO | Admitting: Family Medicine

## 2013-03-22 MED ORDER — ZOLPIDEM TARTRATE ER 6.25 MG PO TBCR: 6.2500 mg | EXTENDED_RELEASE_TABLET | Freq: Every evening | ORAL | Status: DC | PRN

## 2013-03-22 NOTE — Telephone Encounter (Signed)
Patient aware.

## 2013-03-22 NOTE — Telephone Encounter (Signed)
pls let her know the lowest dose of zolpidem has been sent to her pharmacy Do not fill the other,( restoril) too expensive

## 2013-03-22 NOTE — Telephone Encounter (Signed)
Please advise 

## 2013-03-26 NOTE — Progress Notes (Signed)
  Subjective:    Patient ID: Rebecca Gutierrez, female    DOB: 1941/02/24, 72 y.o.   MRN: [id number]  HPI Pt in with main concern of poor sleep and uncontrolled anxiety. Klonopin not affording her rest amd she is tired and worn out. Pelvic surgery is proposed for her prolapse, she is very focused and concerned about pelvic discomfort, stating "something is wrong  Down there"Has been in the care of gyne for approx 6 months for this   Review of Systems See HPI Denies recent fever or chills. Denies sinus pressure, nasal congestion, ear pain or sore throat. Denies chest congestion, productive cough or wheezing. Denies chest pains, palpitations and leg swelling Denies abdominal pain, nausea, vomiting,diarrhea or constipation.   Denies depression,c/o  anxiety and  insomnia. Denies skin break down or rash.        Objective:   Physical Exam Patient alert and oriented and in no cardiopulmonary distress.Anxious  HEENT: No facial asymmetry, EOMI, no sinus tenderness,  oropharynx pink and moist.  Neck supple no adenopathy.  Chest: Clear to auscultation bilaterally.  CVS: S1, S2 no murmurs, no S3.  ABD: Soft non tender. Bowel sounds normal.  Ext: No edema   Psych: Good eye contact, normal affect. Memory intact extremely anxious not  depressed appearing.  CNS: CN 2-12 intact, power,  normal throughout.        Assessment & Plan:

## 2013-03-26 NOTE — Assessment & Plan Note (Signed)
Worsening problem, pt focussed on pelvic pain , feels something is wrong with her body. Has had pelvic US, and is being managed currently by gyne for prolapse, surgery is in the future.  Has been using klonopin for help with anxiety, short term, but sleep is still a problem Restoril was prescribed, but is too expensive, chnaged to [employer] Sleep hygiene reviewed

## 2013-03-26 NOTE — Assessment & Plan Note (Signed)
Controlled, no change in medication Hyperlipidemia:Low fat diet discussed and encouraged.  \ 

## 2013-03-26 NOTE — Assessment & Plan Note (Signed)
Uncontrolled, pt still resisting additional medication, feels it is due to anxiety and poor sleep which would certainly contribute but she also has reported SBP of 190 when EMS went to her home for anxiety DASH diet and commitment to daily physical activity for a minimum of 30 minutes discussed and encouraged, as a part of hypertension management. The importance of attaining a healthy weight is also discussed.

## 2013-03-28 ENCOUNTER — Ambulatory Visit: Payer: BC Managed Care – HMO | Admitting: Obstetrics and Gynecology

## 2013-04-03 ENCOUNTER — Telehealth: Payer: Self-pay | Admitting: Family Medicine

## 2013-04-03 DIAGNOSIS — N3000 Acute cystitis without hematuria: Secondary | ICD-10-CM

## 2013-04-03 NOTE — Telephone Encounter (Signed)
Needs to send  urine c/s before starting antibiotic preferably, after submitted Ok to erx ciprofloxacin 500mg  one twice daily #10, no refill

## 2013-04-03 NOTE — Telephone Encounter (Signed)
Patient states that she is to see Dr. Emelda Fear on Friday.  She is having burning with urination, frequency, cloudy urination and pressure.  Would like to know if ABT can be sent until she sees Shelby.  Please advise.

## 2013-04-04 MED ORDER — CIPROFLOXACIN HCL 500 MG PO TABS: 500.0000 mg | ORAL_TABLET | Freq: Two times a day (BID) | ORAL | Status: DC

## 2013-04-04 NOTE — Addendum Note (Signed)
Addended by: Kandis Fantasia B on: 04/04/2013 12:04 PM   Modules accepted: Orders

## 2013-04-04 NOTE — Telephone Encounter (Signed)
Patient in for specimen and collected script for Cipro.

## 2013-04-04 NOTE — Addendum Note (Signed)
Addended by: Kandis Fantasia B on: 04/04/2013 11:34 AM   Modules accepted: Orders

## 2013-04-04 NOTE — Telephone Encounter (Signed)
Will come by to leave urine and get her rx afterwhile

## 2013-04-05 ENCOUNTER — Ambulatory Visit: Payer: BC Managed Care – HMO | Admitting: Obstetrics and Gynecology

## 2013-04-06 LAB — URINE CULTURE

## 2013-04-07 ENCOUNTER — Ambulatory Visit (INDEPENDENT_AMBULATORY_CARE_PROVIDER_SITE_OTHER): Payer: BC Managed Care – HMO | Admitting: Obstetrics and Gynecology

## 2013-04-07 ENCOUNTER — Encounter: Payer: Self-pay | Admitting: Obstetrics and Gynecology

## 2013-04-07 ENCOUNTER — Other Ambulatory Visit: Payer: Self-pay | Admitting: Family Medicine

## 2013-04-07 VITALS — BP 130/82 | Ht 62.0 in | Wt 129.0 lb

## 2013-04-07 DIAGNOSIS — IMO0002 Reserved for concepts with insufficient information to code with codable children: Secondary | ICD-10-CM

## 2013-04-07 DIAGNOSIS — N812 Incomplete uterovaginal prolapse: Secondary | ICD-10-CM

## 2013-04-07 DIAGNOSIS — N8111 Cystocele, midline: Secondary | ICD-10-CM

## 2013-04-07 MED ORDER — TETRACYCLINE HCL 500 MG PO CAPS: 500.0000 mg | ORAL_CAPSULE | Freq: Three times a day (TID) | ORAL | Status: DC

## 2013-04-07 NOTE — Progress Notes (Signed)
Patient ID: Rebecca Gutierrez, female   DOB: 29-Nov-1940, 72 y.o.   MRN: [id number] Pt here to discuss surgery, pt wants to go over everything with Dr. And son is here as well.   Discussed cystocele and intrauterine prolapse grade 1 surgery which includes a vaginal hysterectomy, bilateral spaginorectomy and posterior repair with pt. Reviewed anatomy using Medical Explainer. Pt reports decreased urine output and discussed reasons for urinary difficulities. Pt denies sexual acitvity. Discussed post surgery restrictions and medications. Pt is still discussing surgery planning with son. advised pt of need for full physical prior to surgery.  Pap in 2013 that was nml 4mm endometrium on US-no biopsy needed  Pt also c/o sleeping trouble. She has been seen by Lodema Hong for this. Was prescribed two prior prescriotions but did not get them filled due to cost. Has been taking OTC sleep aids  Patient and family discussing timing of surgery , will let us know of preferred week(s) for surgery and we will attempt to accomodate

## 2013-04-07 NOTE — Patient Instructions (Signed)
Please notify Amy Zachery Dauer at our office when you have firmed up your preferred surgery dates.[phone]

## 2013-04-10 ENCOUNTER — Other Ambulatory Visit: Payer: Self-pay

## 2013-04-10 ENCOUNTER — Telehealth: Payer: Self-pay | Admitting: Family Medicine

## 2013-04-10 MED ORDER — TETRACYCLINE HCL 500 MG PO CAPS: 500.0000 mg | ORAL_CAPSULE | Freq: Three times a day (TID) | ORAL | Status: DC

## 2013-04-10 NOTE — Telephone Encounter (Signed)
Is the complete physical to be done by [facility], WHAT DO THEY WANT, PRE OP EVAL, OR IS IT PAP , wont be able to respond till I am clear what the visit [facility] wants is for and also want to know where irt should be done pls let me know

## 2013-04-10 NOTE — Telephone Encounter (Signed)
Does this mean we need to cancel her annual wellness visit?

## 2013-04-10 NOTE — Telephone Encounter (Signed)
[facility] wants to do her complete physical before she has surgery and we have one scheduled for a annual wellness for 12.1.2014 and [facility] just wanted to let us know

## 2013-04-11 ENCOUNTER — Telehealth: Payer: Self-pay

## 2013-04-11 MED ORDER — NITROFURANTOIN MONOHYD MACRO 100 MG PO CAPS: 100.0000 mg | ORAL_CAPSULE | Freq: Two times a day (BID) | ORAL | Status: DC

## 2013-04-11 NOTE — Telephone Encounter (Signed)
Patient aware that abt will be changed.  New rx sent in

## 2013-04-11 NOTE — Addendum Note (Signed)
Addended by: Kandis Fantasia B on: 04/11/2013 10:14 AM   Modules accepted: Orders, Medications

## 2013-04-11 NOTE — Telephone Encounter (Signed)
pls send in nitrofurantoin 100mg  one twice daily #14

## 2013-04-11 NOTE — Telephone Encounter (Signed)
appt in dec cancelled and changed to Feb 2

## 2013-04-11 NOTE — Telephone Encounter (Signed)
Since having a CPE this year no annual wellness in this office this year, needs follow upof her blood pressure next year within the first 2 tmonth, please explain to her and schedule

## 2013-04-15 ENCOUNTER — Other Ambulatory Visit: Payer: Self-pay | Admitting: Family Medicine

## 2013-04-17 ENCOUNTER — Ambulatory Visit: Payer: BC Managed Care – HMO | Admitting: Obstetrics and Gynecology

## 2013-04-17 ENCOUNTER — Other Ambulatory Visit: Payer: Self-pay | Admitting: Obstetrics and Gynecology

## 2013-04-17 ENCOUNTER — Other Ambulatory Visit (HOSPITAL_COMMUNITY)
Admission: RE | Admit: 2013-04-17 | Discharge: 2013-04-17 | Disposition: A | Discharge status: Home / Self Care | Payer: MEDICARE | Source: Ambulatory Visit | Attending: Obstetrics and Gynecology | Admitting: Obstetrics and Gynecology

## 2013-04-17 ENCOUNTER — Ambulatory Visit (INDEPENDENT_AMBULATORY_CARE_PROVIDER_SITE_OTHER): Payer: BC Managed Care – HMO | Admitting: Obstetrics and Gynecology

## 2013-04-17 ENCOUNTER — Encounter: Payer: Self-pay | Admitting: Obstetrics and Gynecology

## 2013-04-17 VITALS — BP 124/80 | Ht 61.0 in | Wt 129.0 lb

## 2013-04-17 DIAGNOSIS — Z113 Encounter for screening for infections with a predominantly sexual mode of transmission: Secondary | ICD-10-CM | POA: Insufficient documentation

## 2013-04-17 DIAGNOSIS — Z01419 Encounter for gynecological examination (general) (routine) without abnormal findings: Secondary | ICD-10-CM

## 2013-04-17 DIAGNOSIS — N812 Incomplete uterovaginal prolapse: Secondary | ICD-10-CM

## 2013-04-17 DIAGNOSIS — Z1151 Encounter for screening for human papillomavirus (HPV): Secondary | ICD-10-CM | POA: Insufficient documentation

## 2013-04-17 DIAGNOSIS — N8111 Cystocele, midline: Secondary | ICD-10-CM

## 2013-04-17 NOTE — Progress Notes (Signed)
Patient ID: Rebecca Gutierrez, female   DOB: 05/24/1941, 72 y.o.   MRN: [id number] Pt here today for pre op appointment. Pt had normal pap in 2013. Pt surgery is scheduled for two weeks from tomorrow.

## 2013-04-17 NOTE — Patient Instructions (Signed)
preop appointment next Wednesday The day before the surgery you will perform a bowel prep

## 2013-04-17 NOTE — Progress Notes (Signed)
Patient ID: Rebecca Gutierrez, female   DOB: May 12, 1941, 72 y.o.   MRN: [id number]  Rebecca Gutierrez is an 72 y.o. female. She is here as final preop before Vaginal Hysterectomy, anterior and posterior repair with bilateral salpingoophorectomy scheduled for 02 May 2013, for cystocele, uterine descensus second degree, with recurrent uti's and urinary incomplete voiding .   Pertinent Gynecological History: Menses: post-menopausal Bleeding: none Contraception: post menopausal status DES exposure: unknown Blood transfusions: none Sexually transmitted diseases: no past history Previous GYN Procedures:   Last mammogram: normal Date: 07/2012 Last pap: normal Date: 07/2011,, repeated 04/17/13 OB History: G, P  Menstrual History: Menarche age:  No LMP recorded. Patient is postmenopausal.    Past Medical History  Diagnosis Date  . Hypertension   . Hearing loss   . Gilbert's syndrome   . Hyperlipidemia   . Anxiety     Past Surgical History  Procedure Laterality Date  . Tubal ligation    . Eye surgery  02/13/2011    , right cataract extraction  . Colonoscopy  08/28/2011    Procedure: COLONOSCOPY;  Surgeon: Malissa Hippo, MD;  Location: [facility];  Service: Endoscopy;  Laterality: N/A;  930    Family History  Problem Relation Age of Onset  . Colon cancer Neg Hx   . Hypertension Mother     Social History:  reports that she has never smoked. She has never used smokeless tobacco. She reports that she does not drink alcohol or use illicit drugs.  Allergies:  Allergies  Allergen Reactions  . [name]      (Not in a hospital admission)  ROSRecent uti tx'd Dr Lodema Hong Cipro, now converted to Macrodantin   Blood pressure 124/80, height 5\' 1"  (1.549 m), weight 129 lb (58.514 kg). Physical Exam Physical Examination: General appearance - alert, well appearing, and in no distress, oriented to person, place, and time and normal appearing weight Mental  status - alert, oriented to person, place, and time, normal mood, behavior, speech, dress, motor activity, and thought processes, anxious Eyes - pupils equal and reactive, extraocular eye movements intact Ears - not examined, hard of HEARING, BILATERAL HEARING AIDS Mouth - mucous membranes moist, pharynx normal without lesions and DENTURES UPPER AND LOWER Neck - supple, no significant adenopathy Chest - clear to auscultation, no wheezes, rales or rhonchi, symmetric air entry Heart - normal rate and regular rhythm Abdomen - soft, nontender, nondistended, no masses or organomegaly Pelvic - VULVA: vulvar excoriation POSTERIOR FOURCHETTE , VAGINA: normal appearing vagina with normal color and discharge, no lesions, atrophic, PELVIC FLOOR EXAM: cystocele TO INTROITUS, GOOD URETHRAL SUPPORT, uterine descensus grade 2, CERVIX: normal appearing cervix without discharge or lesions, PAP DONE AGAIN UTERUS: uterus is normal size, shape, consistency and nontender, mobile, SMALL, ULTRASOUND SHOWS A THIN ENDOMETRIUM 4 MM THICKNESS WITHOUT IRREGULARITY , ADNEXA: normal adnexa in size, nontender and no masses, no masses, RECTAL: normal rectal, no masses, thrombosed external hemorrhoids, PAP: Pap smear done today, saw liquid based cytology, exam chaperoned by kUNEFF DO. Rectal - normal rectal, no masses Extremities - peripheral pulses normal, no pedal edema, no clubbing or cyanosis, no pedal edema noted    No results found for this or any previous visit (from the past 24 hour(s)).  No results found.  Assessment/Plan: CYSTOCELE, TO INTROITUS, SECOND DEGREE UT DESCENSUS PLAN: trANSVAGINAL HYSTERECTOMY, ANTERIOR AND POSTERIOR REPAIR, BILATERAL SALPINGOOPHORECTOMY. sURGERY SCHEDULED FOR 02 May 2013. pREOP LABS NEXT WEEK.  Maddax Palinkas V 04/17/2013, 2:33 PM

## 2013-04-19 ENCOUNTER — Other Ambulatory Visit: Payer: Self-pay | Admitting: Family Medicine

## 2013-04-20 ENCOUNTER — Encounter (HOSPITAL_COMMUNITY): Payer: Self-pay | Admitting: Pharmacy Technician

## 2013-04-21 ENCOUNTER — Other Ambulatory Visit: Payer: Self-pay | Admitting: Obstetrics and Gynecology

## 2013-04-21 NOTE — H&P (Signed)
Patient ID: Rebecca Gutierrez, female DOB: 01/03/1941, 72 y.o. MRN: [id number]  Rebecca Gutierrez is an 72 y.o. female. She is here as final preop before Vaginal Hysterectomy, anterior and posterior repair with bilateral salpingoophorectomy scheduled for 02 May 2013, for cystocele, uterine descensus second degree, with recurrent uti's and urinary incomplete voiding .  Pertinent Gynecological History:  Menses: post-menopausal  Bleeding: none  Contraception: post menopausal status  DES exposure: unknown  Blood transfusions: none  Sexually transmitted diseases: no past history  Previous GYN Procedures:  Last mammogram: normal Date: 07/2012  Last pap: normal Date: 07/2011,, repeated 04/17/13  OB History: G, P  Menstrual History:  Menarche age:  No LMP recorded. Patient is postmenopausal.  Past Medical History   Diagnosis  Date   .  Hypertension    .  Hearing loss    .  Gilbert's syndrome    .  Hyperlipidemia    .  Anxiety     Past Surgical History   Procedure  Laterality  Date   .  Tubal ligation     .  Eye surgery   02/13/2011     , right cataract extraction   .  Colonoscopy   08/28/2011     Procedure: COLONOSCOPY; Surgeon: Najeeb U Rehman, MD; Location: [facility]; Service: Endoscopy; Laterality: N/A; 930    Family History   Problem  Relation  Age of Onset   .  Colon cancer  Neg Hx    .  Hypertension  Mother    Social History: reports that she has never smoked. She has never used smokeless tobacco. She reports that she does not drink alcohol or use illicit drugs.  Allergies:  Allergies   Allergen  Reactions   .  [name]    (Not in a hospital admission)  ROSRecent uti tx'd Dr simpson Cipro, now converted to Macrodantin  Blood pressure 124/80, height 5' 1" (1.549 m), weight 129 lb (58.514 kg).  Physical Exam  Physical Examination: General appearance - alert, well appearing, and in no distress, oriented to person, place, and time and normal appearing  weight  Mental status - alert, oriented to person, place, and time, normal mood, behavior, speech, dress, motor activity, and thought processes, anxious  Eyes - pupils equal and reactive, extraocular eye movements intact  Ears - not examined, hard of HEARING, BILATERAL HEARING AIDS  Mouth - mucous membranes moist, pharynx normal without lesions and DENTURES UPPER AND LOWER  Neck - supple, no significant adenopathy  Chest - clear to auscultation, no wheezes, rales or rhonchi, symmetric air entry  Heart - normal rate and regular rhythm  Abdomen - soft, nontender, nondistended, no masses or organomegaly  Pelvic - VULVA: vulvar excoriation POSTERIOR FOURCHETTE  , VAGINA: normal appearing vagina with normal color and discharge, no lesions, atrophic, PELVIC FLOOR EXAM: cystocele TO INTROITUS, GOOD URETHRAL SUPPORT, uterine descensus grade 2, CERVIX: normal appearing cervix without discharge or lesions, PAP DONE AGAIN  UTERUS: uterus is normal size, shape, consistency and nontender, mobile, SMALL, ULTRASOUND SHOWS A THIN ENDOMETRIUM 4 MM THICKNESS WITHOUT IRREGULARITY  , ADNEXA: normal adnexa in size, nontender and no masses, no masses, RECTAL: normal rectal, no masses, thrombosed external hemorrhoids, PAP: Pap smear done today, saw liquid based cytology, exam chaperoned by kUNEFF DO.  Rectal - normal rectal, no masses  Extremities - peripheral pulses normal, no pedal edema, no clubbing or cyanosis, no pedal edema noted  No results found for this or any previous visit (from the   past 24 hour(s)).  No results found.  Assessment/Plan:  CYSTOCELE, TO INTROITUS, SECOND DEGREE UT DESCENSUS  PLAN: trANSVAGINAL HYSTERECTOMY, ANTERIOR AND POSTERIOR REPAIR, BILATERAL SALPINGOOPHORECTOMY.  sURGERY SCHEDULED FOR 02 May 2013.  Preop labs 04/26/13     Prabhjot Piscitello V  

## 2013-04-25 ENCOUNTER — Telehealth: Payer: Self-pay

## 2013-04-25 ENCOUNTER — Encounter: Payer: Self-pay | Admitting: Family Medicine

## 2013-04-25 ENCOUNTER — Ambulatory Visit (INDEPENDENT_AMBULATORY_CARE_PROVIDER_SITE_OTHER): Payer: BC Managed Care – HMO | Admitting: Family Medicine

## 2013-04-25 VITALS — BP 134/78 | HR 79 | Temp 98.7°F | Resp 16 | Wt 139.8 lb

## 2013-04-25 DIAGNOSIS — H60399 Other infective otitis externa, unspecified ear: Secondary | ICD-10-CM

## 2013-04-25 DIAGNOSIS — J019 Acute sinusitis, unspecified: Secondary | ICD-10-CM | POA: Insufficient documentation

## 2013-04-25 DIAGNOSIS — H6092 Unspecified otitis externa, left ear: Secondary | ICD-10-CM

## 2013-04-25 DIAGNOSIS — I10 Essential (primary) hypertension: Secondary | ICD-10-CM

## 2013-04-25 MED ORDER — AZITHROMYCIN 250 MG PO TABS: ORAL_TABLET | ORAL | Status: AC

## 2013-04-25 NOTE — Progress Notes (Signed)
  Subjective:    Patient ID: Rebecca Gutierrez, female    DOB: 08/07/1940, 72 y.o.   MRN: [id number]  HPI Left ear pain this morning, with bubbling and pressure, head congestion with tickle in throat and non productive cough which started this morning, no fever, chills  Or pain. Recently was with her daughter in law who ended up with pneumonia , started with similar symptoms Anxious as she has upcoming surgery next week   Review of Systems See HPI Denies recent fever or chills.  Denies chest congestion, productive cough or wheezing. Denies chest pains, palpitations and leg swelling Denies abdominal pain, nausea, vomiting,diarrhea or constipation.   Denies dysuria, frequency, hesitancy or incontinence.  Denies skin break down or rash.        Objective:   Physical Exam  Patient alert and oriented and in no cardiopulmonary distress.  HEENT: No facial asymmetry, EOMI, maxillary  sinus tenderness,  oropharynx pink and moist.  Neck supple no adenopathy.left tM mildly erythematous  Chest: Clear to auscultation bilaterally.  CVS: S1, S2 no murmurs, no S3.  ABD: Soft non tender. Bowel sounds normal.  Ext: No edema       Assessment & Plan:

## 2013-04-25 NOTE — Patient Instructions (Signed)
Allison Deshotels Kolodziejski  04/25/2013   Your procedure is scheduled on:  05/02/2013  Report to [facility] at  930  AM.  Call this number if you have problems the morning of surgery: [phone]   Remember:   Do not eat food or drink liquids after midnight.   Take these medicines the morning of surgery with A SIP OF WATER: hydrodiuril, metoprolol, altace   Do not wear jewelry, make-up or nail polish.  Do not wear lotions, powders, or perfumes.   Do not shave 48 hours prior to surgery. Men may shave face and neck.  Do not bring valuables to the hospital.  [facility] is not responsible for any belongings or valuables.               Contacts, dentures or bridgework may not be worn into surgery.  Leave suitcase in the car. After surgery it may be brought to your room.  For patients admitted to the hospital, discharge time is determined by your treatment team.               Patients discharged the day of surgery will not be allowed to drive home.  Name and phone number of your driver: family Special Instructions: Shower using CHG 2 nights before surgery and the night before surgery.  If you shower the day of surgery use CHG.  Use special wash - you have one bottle of CHG for all showers.  You should use approximately 1/3 of the bottle for each shower.   Please read over the following fact sheets that you were given: Pain Booklet, Coughing and Deep Breathing, Surgical Site Infection Prevention, Anesthesia Post-op Instructions and Care and Recovery After Surgery     Unilateral  Anterior and Posterior Colporrhaphy Anterior or posterior colporrhaphy is surgery to fix a prolapse of organs in the genital tract. Prolapse means the falling down, bulging, dropping, or drooping of an organ. Organs that commonly prolapse include the rectum, bladder, vagina, and uterus. Prolapse can affect a single organ or several organs at the same time. This often worsens when women stop having their monthly periods  (menopause) because estrogen loss weakens the muscles and tissues in the genital tract. In addition, prolapse happens when the organs are damaged or weakened. This commonly happens after childbirth and as a result of aging. Surgery is often done for severe prolapses.  The type of colporrhaphy done depends on the type of genital prolapse. Types of genital prolapse include the following:   Cystocele. This is a prolapse of the upper (anterior) wall of the vagina. The anterior wall bulges into the vagina and brings the bladder with it.   Rectocele. This is a prolapse of the lower (posterior) wall of the vagina. The posterior vaginal wall bulges into the vagina and brings the rectum with it.   Enterocele. This is a prolapse of part of the pelvic organs called the pouch of Douglas. It also involves a portion of the small bowel. It appears as a bulge under the neck of the uterus at the top of the back wall of the vagina.   Procidentia. This is a complete prolapse of the uterus and the cervix. The prolapse can be seen and felt coming out of the vagina. LET [facility] CARE PROVIDER KNOW ABOUT:   Any allergies you have.   All medicines you are taking, including vitamins, herbs, eye drops, creams, and over-the-counter medicines.   Previous problems you or members of your family  have had with the use of anesthetics.   Any blood disorders you have.   Previous surgeries you have had.   Medical conditions you have.   Smoking history or history of alcohol use.   Possibility of pregnancy, if this applies.  RISKS AND COMPLICATIONS Generally, anterior or posterior colporrhaphy is a safe procedure. However, as with any procedure, complications can occur. Possible complications include:   Infection.   Damage to other organs during surgery.   Bleeding after surgery.   Problems urinating.   Problems from the anesthetic.  BEFORE THE PROCEDURE  Ask your health care provider about  changing or stopping your regular medicines.   Do not eat or drink anything for at least 8 hours before the surgery.   If you smoke, do not smoke for at least 2 weeks before the surgery.   Make plans to have someone drive you home after your hospital stay. Also, arrange for someone to help you with activities during recovery. PROCEDURE  You may be given medicine to help you relax before the surgery (sedative). During the surgery you will be given medicine to make you sleep through the procedure (general anesthetic) or medicine to numb you from the waist down (spinal anesthetic). This medicine will be given through an intravenous (IV) access tube that is put into one of your veins.  The procedure will vary depending on the type of repair:   Anterior repair. A cut (incision) is made in the midline section of the front part of the vaginal wall. A triangular-shaped piece of vaginal tissue is removed, and the stronger, healthier tissue is sewn together in order to support and suspend the bladder.   Posterior repair. An incision is made midline on the back wall of the vagina. A triangular portion of vaginal skin is removed to expose the muscle. Excess tissue is removed, and stronger, healthier muscle and ligament tissue is sewn together to support the rectum.   Anterior and posterior repair. Both procedures are done during the same surgery. AFTER THE PROCEDURE You will be taken to a recovery area. Your blood pressure, pulse, breathing, and temperature (vital signs) will be monitored. This is done until you are stable. Then you will be transferred to a hospital room.  After surgery, you will have a small rubber tube in place to drain your bladder (urinary catheter). This will be in place for 2 to 7 days or until your bladder is working properly on its own. The IV access tube will be removed in 1 to 3 days. You may have a gauze packing in your vagina to prevent bleeding. This will be removed 2 or 3  days after the surgery. You will likely need to stay in the hospital for 3 to 5 days.  Document Released: 08/22/2003 Document Revised: 02/01/2013 Document Reviewed: 10/21/2012 [facility] Patient Information 2014 [place], [place].  Salpingo-Oophorectomy Unilateral salpingo-oophorectomy is the removal of one fallopian tube and ovary. The fallopian tubes transport the egg from the ovary to the womb (uterus). The fallopian tube is also where the sperm and egg meet and become fertilized and move down into the uterus.  Removing one tube and ovary will not:  Cause problems with your menstrual periods.  Cause problems with your sex drive (libido).  Put you into the menopause.  Give symptoms of the menopause.  Make you not able to get pregnant (sterile). There are several reasons for doing a salpingo-oophorectomy:  Infection of the tube and ovary.  There may be scar  tissue of the tube and ovary (adhesions).  It may be necessary to remove the tube when the ovary has a cyst or tumor.  It may have to be done when removing the uterus.  It may have to be done when there is cancer of the tube or ovary. LET YOUR CAREGIVER KNOW ABOUT:  Allergies to food or medications.  All the medications you are taking including prescription and over-the-counter herbs, eye drops and creams.  If you are using illegal drugs or excessive alcohol.  Your smoking habits.  Previous problems with anesthesia including numbing medication.  The possibility of being pregnant.  History of blood clots or bleeding problems.  Previous surgery.  Any other medical or health problems. RISKS AND COMPLICATIONS  All surgery is associated with risks. Some of these risks are:  Injury to surrounding organs.  Bleeding.  Infection.  Blood clots in the legs or lungs.  Problems with the anesthesia.  The surgery does not help the problem.  Death. BEFORE THE PROCEDURE  Do not take aspirin or blood thinners because it  can make you bleed.  Do not eat or drink anything at least 8 hours before the surgery.  Let your caregiver know if you develop a cold or an infection.  If you are being admitted the day of surgery, arrive at least one hour before the surgery.  Arrange for help when you go home from the hospital.  If you smoke, do not smoke for at least 2 weeks before the surgery. PROCEDURE After being admitted to the hospital, you will change into a hospital gown. Then, you will be given an IV (intravenous) and a medication to relax you. Then, you will be put to sleep with an anesthetic. Any hair on your lower belly (abdomen) will be removed, and a catheter will be placed in your bladder. The fallopian tube and ovary will be removed either through 2 very small cuts (incisions) or through large incision in the lower abdomen. The blood vessels will be clamped and tied. AFTER THE PROCEDURE  You will be taken to the recovery room for 1 to 3 hours until your blood pressure, pulse and temperature are stable and you are waking up.  If you had a laparoscopy, you may be discharged in several hours.  If you had a large incision, you will be admitted to the hospital for a day or two.  If you had a laparoscopy, you may have shoulder pain. This is not unusual. It is from air that is left in the abdomen and affects the nerve that goes from the diaphragm to the shoulder. It goes away in a day or two.  You will be given pain medication as necessary.  The intravenous and catheter will be removed before you are discharged.  Have someone available to take you home. HOME CARE INSTRUCTIONS   It is normal to be sore for a week or two. Call your caregiver if the pain is getting worse or the pain medication is not helping.  Have help when you go home for a week or so to help with the household chores.  Follow your caregiver's advice regarding diet.  Get rest and sleep.  Only take over-the-counter or prescription  medicines for pain or discomfort as directed by your caregiver.  Do not take aspirin. It can cause bleeding.  Do not drive, exercise or lift anything over 5 pounds.  Do not drink alcohol until your caregiver gives you permission.  Do not lift anything  over 5 pounds.  Do not have sexual intercourse until your caregiver says it is OK.  Take your temperature twice a day and write it down.  Change the bandage (dressing) as directed.  Make and keep your follow-up appointments for postoperative care.  If you become constipated, ask your caregiver about taking a mild laxative. Drinking more liquids than usual and eating bran foods can help prevent constipation. SEEK MEDICAL CARE IF:   You have swelling or redness around the cut (incision).  You develop a rash.  You have side effects from the medication.  You feel lightheaded.  You need more or stronger medication.  You have pain, swelling or redness where the IV (intravenous) was placed. SEEK IMMEDIATE MEDICAL CARE IF:   You develop an unexplained temperature above 100 F (37.8 C).  You develop increasing belly (abdominal) pain.  You have pus coming out of the incision.  You notice a bad smell coming from the wound or dressing.  The incision is separating.  There is excessive vaginal bleeding.  You start to feel sick to your stomach (nauseous) and vomit.  You have leg or chest pain.  You have pain when you urinate.  You develop shortness of breath.  You pass out. Document Released: 03/29/2009 Document Revised: 08/24/2011 Document Reviewed: 11/23/2012 [facility] Patient Information 2014 [place], [place]. Hysterectomy Care After Refer to this sheet in the next few weeks. These instructions provide you with information on caring for yourself after your procedure. Your caregiver may also give you more specific instructions. Your treatment has been planned according to current medical practices, but problems sometimes  occur. Call your caregiver if you have any problems or questions after your procedure. HOME CARE INSTRUCTIONS  Healing will take time. You may have discomfort, tenderness, swelling, and bruising at the surgical site for about 2 weeks. This is normal and will get better as time goes on.  Only take over-the-counter or prescription medicines for pain, discomfort, or fever as directed by your caregiver.  Do not take aspirin. It can cause bleeding.  Do not drive when taking pain medicine.  Follow your caregiver's advice regarding exercise, lifting, driving, and general activities.  Resume your usual diet as directed and allowed.  Get plenty of rest and sleep.  Do not douche, use tampons, or have sexual intercourse for at least 6 weeks or until your caregiver gives you permission.  Change your bandages (dressings) as directed by your caregiver.  Monitor your temperature.  Take showers instead of baths for 2 to 3 weeks.  Do not drink alcohol until your caregiver gives you permission.  If you are constipated, you may take a mild laxative with your caregiver's permission. Bran foods may help with constipation problems. Drinking enough fluids to keep your urine clear or pale yellow may help as well.  Try to have someone home with you for 1 or 2 weeks to help around the house.  Keep all of your follow-up appointments as directed by your caregiver. SEEK MEDICAL CARE IF:   You have swelling, redness, or increasing pain in the surgical cut (incision) area.  You have pus coming from the incision.  You notice a bad smell coming from the incision or dressing.  You have swelling, redness, or pain around the intravenous (IV) site.  Your incision breaks open.  You feel dizzy or lightheaded.  You have pain or bleeding when you urinate.  You have persistent diarrhea.  You have persistent nausea and vomiting.  You have abnormal  vaginal discharge.  You have a rash.  You have any type  of abnormal reaction or develop an allergy to your medicine.  Your pain is not controlled with your prescribed medicine. SEEK IMMEDIATE MEDICAL CARE IF:   You have a fever.  You have severe abdominal pain.  You have chest pain.  You have shortness of breath.  You faint.  You have pain, swelling, or redness of your leg.  You have heavy vaginal bleeding with blood clots. MAKE SURE YOU:  Understand these instructions.  Will watch your condition.  Will get help right away if you are not doing well or get worse. Document Released: 12/19/2004 Document Revised: 08/24/2011 Document Reviewed: 01/16/2011 [facility] Patient Information 2014 [place], [place]. PATIENT INSTRUCTIONS POST-ANESTHESIA  IMMEDIATELY FOLLOWING SURGERY:  Do not drive or operate machinery for the first twenty four hours after surgery.  Do not make any important decisions for twenty four hours after surgery or while taking narcotic pain medications or sedatives.  If you develop intractable nausea and vomiting or a severe headache please notify your doctor immediately.  FOLLOW-UP:  Please make an appointment with your surgeon as instructed. You do not need to follow up with anesthesia unless specifically instructed to do so.  WOUND CARE INSTRUCTIONS (if applicable):  Keep a dry clean dressing on the anesthesia/puncture wound site if there is drainage.  Once the wound has quit draining you may leave it open to air.  Generally you should leave the bandage intact for twenty four hours unless there is drainage.  If the epidural site drains for more than 36-48 hours please call the anesthesia department.  QUESTIONS?:  Please feel free to call your physician or the hospital operator if you have any questions, and they will be happy to assist you.

## 2013-04-25 NOTE — Patient Instructions (Signed)
F/u as before  You are being treated with azithromycin, an antibiotic , for 5 days, for sinusitis and left ear infection. Please take as directed.  Use benadryl for allergy symptoms, and ok to flush nostrils with saline to relieve pressure.  Try to avoid "decongestants" as these will raise your blood pressure

## 2013-04-25 NOTE — Telephone Encounter (Signed)
Patient to come in this afternoon

## 2013-04-25 NOTE — Telephone Encounter (Signed)
pls work in this pm 

## 2013-04-26 ENCOUNTER — Encounter (HOSPITAL_COMMUNITY): Payer: Self-pay

## 2013-04-26 ENCOUNTER — Telehealth: Payer: Self-pay

## 2013-04-26 ENCOUNTER — Encounter (HOSPITAL_COMMUNITY)
Admission: RE | Admit: 2013-04-26 | Discharge: 2013-04-26 | Disposition: A | Discharge status: Home / Self Care | Payer: MEDICARE | Source: Ambulatory Visit | Attending: Obstetrics and Gynecology | Admitting: Obstetrics and Gynecology

## 2013-04-26 ENCOUNTER — Other Ambulatory Visit: Payer: Self-pay

## 2013-04-26 DIAGNOSIS — Z01812 Encounter for preprocedural laboratory examination: Secondary | ICD-10-CM | POA: Insufficient documentation

## 2013-04-26 LAB — CBC WITH DIFFERENTIAL/PLATELET
Basophils Absolute: 0 10*3/uL (ref 0.0–0.1)
Eosinophils Relative: 1 % (ref 0–5)
HCT: 40.3 % (ref 36.0–46.0)
Hemoglobin: 14.2 g/dL (ref 12.0–15.0)
Lymphocytes Relative: 17 % (ref 12–46)
Lymphs Abs: 0.9 10*3/uL (ref 0.7–4.0)
MCHC: 35.2 g/dL (ref 30.0–36.0)
MCV: 89.6 fL (ref 78.0–100.0)
Monocytes Absolute: 0.4 10*3/uL (ref 0.1–1.0)
Monocytes Relative: 8 % (ref 3–12)
Neutro Abs: 4.2 10*3/uL (ref 1.7–7.7)
Neutrophils Relative %: 75 % (ref 43–77)
Platelets: 202 10*3/uL (ref 150–400)
RBC: 4.5 MIL/uL (ref 3.87–5.11)
RDW: 12.5 % (ref 11.5–15.5)
WBC: 5.6 10*3/uL (ref 4.0–10.5)

## 2013-04-26 LAB — BASIC METABOLIC PANEL
CO2: 29 mEq/L (ref 19–32)
Chloride: 99 mEq/L (ref 96–112)
Creatinine, Ser: 0.81 mg/dL (ref 0.50–1.10)
Potassium: 3.9 mEq/L (ref 3.5–5.1)
Sodium: 139 mEq/L (ref 135–145)

## 2013-04-26 LAB — TYPE AND SCREEN: ABO/RH(D): A NEG

## 2013-04-26 NOTE — Telephone Encounter (Signed)
Pt informed per Cyril Mourning, NP should be ok for surgery with Dr. Emelda Fear on 05/02/2013, unless there is a change in pt condition. Pt informed message routed to Dr. Emelda Fear as Lorain Childes. Pt encouraged to call our office back if any changes. Pt verbalized understanding.

## 2013-05-02 ENCOUNTER — Ambulatory Visit (HOSPITAL_COMMUNITY)
Admission: RE | Admit: 2013-05-02 | Discharge: 2013-05-03 | Disposition: A | Discharge status: Home / Self Care | Payer: MEDICARE | Source: Ambulatory Visit | Attending: Obstetrics and Gynecology | Admitting: Obstetrics and Gynecology

## 2013-05-02 ENCOUNTER — Encounter (HOSPITAL_COMMUNITY): Payer: Self-pay | Admitting: *Deleted

## 2013-05-02 ENCOUNTER — Ambulatory Visit (HOSPITAL_COMMUNITY): Payer: MEDICARE | Admitting: Anesthesiology

## 2013-05-02 ENCOUNTER — Encounter (HOSPITAL_COMMUNITY)
Admission: RE | Disposition: A | Discharge status: Home / Self Care | Payer: Self-pay | Source: Ambulatory Visit | Attending: Obstetrics and Gynecology

## 2013-05-02 ENCOUNTER — Encounter (HOSPITAL_COMMUNITY): Payer: MEDICARE | Admitting: Anesthesiology

## 2013-05-02 DIAGNOSIS — N814 Uterovaginal prolapse, unspecified: Secondary | ICD-10-CM

## 2013-05-02 DIAGNOSIS — N329 Bladder disorder, unspecified: Secondary | ICD-10-CM

## 2013-05-02 DIAGNOSIS — D252 Subserosal leiomyoma of uterus: Secondary | ICD-10-CM

## 2013-05-02 DIAGNOSIS — Z01812 Encounter for preprocedural laboratory examination: Secondary | ICD-10-CM | POA: Insufficient documentation

## 2013-05-02 DIAGNOSIS — N8111 Cystocele, midline: Secondary | ICD-10-CM | POA: Insufficient documentation

## 2013-05-02 DIAGNOSIS — N816 Rectocele: Secondary | ICD-10-CM | POA: Insufficient documentation

## 2013-05-02 DIAGNOSIS — N859 Noninflammatory disorder of uterus, unspecified: Secondary | ICD-10-CM

## 2013-05-02 HISTORY — PX: VAGINAL HYSTERECTOMY: SHX2639

## 2013-05-02 HISTORY — PX: ANTERIOR AND POSTERIOR REPAIR: SHX5121

## 2013-05-02 LAB — URINALYSIS, ROUTINE W REFLEX MICROSCOPIC
Bilirubin Urine: NEGATIVE
Ketones, ur: 15 mg/dL — AB
Leukocytes, UA: NEGATIVE
Nitrite: NEGATIVE
Protein, ur: NEGATIVE mg/dL
Specific Gravity, Urine: 1.015 (ref 1.005–1.030)
Urobilinogen, UA: 0.2 mg/dL (ref 0.0–1.0)

## 2013-05-02 SURGERY — HYSTERECTOMY, VAGINAL
Anesthesia: Monitor Anesthesia Care | Wound class: Clean Contaminated

## 2013-05-02 MED ORDER — ARTIFICIAL TEARS OP OINT
TOPICAL_OINTMENT | OPHTHALMIC | Status: DC | PRN
  Administered 2013-05-02: 50 via OPHTHALMIC

## 2013-05-02 MED ORDER — BUPIVACAINE IN DEXTROSE 0.75-8.25 % IT SOLN
INTRATHECAL | Status: DC | PRN
  Administered 2013-05-02: 15 mg via INTRATHECAL

## 2013-05-02 MED ORDER — BUPIVACAINE-EPINEPHRINE 0.5% -1:200000 IJ SOLN
INTRAMUSCULAR | Status: DC | PRN
  Administered 2013-05-02: 30 mL

## 2013-05-02 MED ORDER — LACTATED RINGERS IV SOLN
INTRAVENOUS | Status: DC
  Administered 2013-05-02: 1000 mL via INTRAVENOUS

## 2013-05-02 MED ORDER — FENTANYL CITRATE 0.05 MG/ML IJ SOLN
INTRAMUSCULAR | Status: AC
  Filled 2013-05-02: qty 2

## 2013-05-02 MED ORDER — DIPHENHYDRAMINE HCL 12.5 MG/5ML PO ELIX: 12.5000 mg | ORAL_SOLUTION | Freq: Four times a day (QID) | ORAL | Status: DC | PRN

## 2013-05-02 MED ORDER — PROPOFOL 10 MG/ML IV EMUL
INTRAVENOUS | Status: AC
  Filled 2013-05-02: qty 20

## 2013-05-02 MED ORDER — 0.9 % SODIUM CHLORIDE (POUR BTL) OPTIME
TOPICAL | Status: DC | PRN
  Administered 2013-05-02: 1000 mL

## 2013-05-02 MED ORDER — SUFENTANIL CITRATE 50 MCG/ML IV SOLN
INTRAVENOUS | Status: AC
  Filled 2013-05-02: qty 1

## 2013-05-02 MED ORDER — CEFAZOLIN SODIUM-DEXTROSE 2-3 GM-% IV SOLR
INTRAVENOUS | Status: AC
  Filled 2013-05-02: qty 50

## 2013-05-02 MED ORDER — ONDANSETRON HCL 4 MG/2ML IJ SOLN
INTRAMUSCULAR | Status: AC
  Filled 2013-05-02: qty 2

## 2013-05-02 MED ORDER — ONDANSETRON HCL 4 MG/2ML IJ SOLN: 4.0000 mg | Freq: Once | INTRAMUSCULAR | Status: DC | PRN

## 2013-05-02 MED ORDER — FENTANYL CITRATE 0.05 MG/ML IJ SOLN: 25.0000 ug | INTRAMUSCULAR | Status: DC | PRN

## 2013-05-02 MED ORDER — PROPOFOL INFUSION 10 MG/ML OPTIME
INTRAVENOUS | Status: DC | PRN
  Administered 2013-05-02: 75 ug/kg/min via INTRAVENOUS
  Administered 2013-05-02: 40 ug/kg/min via INTRAVENOUS

## 2013-05-02 MED ORDER — ONDANSETRON HCL 4 MG/2ML IJ SOLN: 4.0000 mg | Freq: Four times a day (QID) | INTRAMUSCULAR | Status: DC | PRN

## 2013-05-02 MED ORDER — SODIUM CHLORIDE 0.9 % IR SOLN
Status: DC | PRN
  Administered 2013-05-02: 3000 mL

## 2013-05-02 MED ORDER — LIDOCAINE HCL (CARDIAC) 10 MG/ML IV SOLN
INTRAVENOUS | Status: DC | PRN
  Administered 2013-05-02: 50 mg via INTRAVENOUS

## 2013-05-02 MED ORDER — SUCCINYLCHOLINE CHLORIDE 20 MG/ML IJ SOLN
INTRAMUSCULAR | Status: AC
  Filled 2013-05-02: qty 1

## 2013-05-02 MED ORDER — FENTANYL CITRATE 0.05 MG/ML IJ SOLN
INTRAMUSCULAR | Status: DC | PRN
  Administered 2013-05-02: 50 ug via INTRAVENOUS
  Administered 2013-05-02: 20 ug via INTRATHECAL
  Administered 2013-05-02: 30 ug via INTRAVENOUS

## 2013-05-02 MED ORDER — BUPIVACAINE-EPINEPHRINE PF 0.5-1:200000 % IJ SOLN
INTRAMUSCULAR | Status: AC
  Filled 2013-05-02: qty 10

## 2013-05-02 MED ORDER — ARTIFICIAL TEARS OP OINT
TOPICAL_OINTMENT | OPHTHALMIC | Status: AC
  Filled 2013-05-02: qty 3.5

## 2013-05-02 MED ORDER — CEFAZOLIN SODIUM-DEXTROSE 2-3 GM-% IV SOLR
2.0000 g | INTRAVENOUS | Status: AC
  Administered 2013-05-02: 2 g via INTRAVENOUS

## 2013-05-02 MED ORDER — LACTATED RINGERS IV SOLN
INTRAVENOUS | Status: DC
  Administered 2013-05-02 – 2013-05-03 (×2): via INTRAVENOUS

## 2013-05-02 MED ORDER — CEFAZOLIN SODIUM-DEXTROSE 2-3 GM-% IV SOLR: 2.0000 g | INTRAVENOUS | Status: DC

## 2013-05-02 MED ORDER — MIDAZOLAM HCL 2 MG/2ML IJ SOLN
INTRAMUSCULAR | Status: AC
  Filled 2013-05-02: qty 2

## 2013-05-02 MED ORDER — FENTANYL CITRATE 0.05 MG/ML IJ SOLN
25.0000 ug | INTRAMUSCULAR | Status: AC
Start: 2013-05-02 — End: 2013-05-02
  Administered 2013-05-02 (×2): 25 ug via INTRAVENOUS

## 2013-05-02 MED ORDER — EPHEDRINE SULFATE 50 MG/ML IJ SOLN
INTRAMUSCULAR | Status: AC
  Filled 2013-05-02: qty 1

## 2013-05-02 MED ORDER — SODIUM CHLORIDE 0.9 % IJ SOLN: 9.0000 mL | INTRAMUSCULAR | Status: DC | PRN

## 2013-05-02 MED ORDER — STERILE WATER FOR IRRIGATION IR SOLN
Status: DC | PRN
  Administered 2013-05-02: 1000 mL

## 2013-05-02 MED ORDER — ZOLPIDEM TARTRATE 5 MG PO TABS
5.0000 mg | ORAL_TABLET | Freq: Every evening | ORAL | Status: DC | PRN
  Administered 2013-05-02: 5 mg via ORAL
  Filled 2013-05-02: qty 1

## 2013-05-02 MED ORDER — ROCURONIUM BROMIDE 50 MG/5ML IV SOLN
INTRAVENOUS | Status: AC
  Filled 2013-05-02: qty 1

## 2013-05-02 MED ORDER — ONDANSETRON HCL 4 MG/2ML IJ SOLN
4.0000 mg | Freq: Once | INTRAMUSCULAR | Status: AC
  Administered 2013-05-02: 4 mg via INTRAVENOUS

## 2013-05-02 MED ORDER — LIDOCAINE HCL (PF) 1 % IJ SOLN
INTRAMUSCULAR | Status: AC
  Filled 2013-05-02: qty 5

## 2013-05-02 MED ORDER — PHENYLEPHRINE HCL 10 MG/ML IJ SOLN
INTRAMUSCULAR | Status: DC | PRN
  Administered 2013-05-02 (×4): 100 ug via INTRAVENOUS

## 2013-05-02 MED ORDER — HYDROMORPHONE HCL PF 1 MG/ML IJ SOLN
1.0000 mg | INTRAMUSCULAR | Status: DC | PRN
  Administered 2013-05-03: 1 mg via INTRAVENOUS
  Filled 2013-05-02: qty 1

## 2013-05-02 MED ORDER — HYDROMORPHONE 0.3 MG/ML IV SOLN: INTRAVENOUS | Status: DC

## 2013-05-02 MED ORDER — METOPROLOL TARTRATE 25 MG PO TABS
25.0000 mg | ORAL_TABLET | Freq: Two times a day (BID) | ORAL | Status: DC
  Administered 2013-05-02 – 2013-05-03 (×2): 25 mg via ORAL
  Filled 2013-05-02 (×2): qty 1

## 2013-05-02 MED ORDER — DIPHENHYDRAMINE HCL 50 MG/ML IJ SOLN: 12.5000 mg | Freq: Four times a day (QID) | INTRAMUSCULAR | Status: DC | PRN

## 2013-05-02 MED ORDER — SODIUM CHLORIDE BACTERIOSTATIC 0.9 % IJ SOLN
INTRAMUSCULAR | Status: AC
  Filled 2013-05-02: qty 10

## 2013-05-02 MED ORDER — MIDAZOLAM HCL 2 MG/2ML IJ SOLN
1.0000 mg | INTRAMUSCULAR | Status: DC | PRN
  Administered 2013-05-02: 2 mg via INTRAVENOUS

## 2013-05-02 MED ORDER — MIDAZOLAM HCL 5 MG/5ML IJ SOLN
INTRAMUSCULAR | Status: DC | PRN
  Administered 2013-05-02 (×2): 1 mg via INTRAVENOUS

## 2013-05-02 MED ORDER — SUFENTANIL CITRATE 50 MCG/ML IV SOLN
INTRAVENOUS | Status: DC | PRN
  Administered 2013-05-02 (×3): 5 ug via INTRAVENOUS

## 2013-05-02 MED ORDER — NALOXONE HCL 0.4 MG/ML IJ SOLN: 0.4000 mg | INTRAMUSCULAR | Status: DC | PRN

## 2013-05-02 MED ORDER — EPHEDRINE SULFATE 50 MG/ML IJ SOLN
INTRAMUSCULAR | Status: DC | PRN
  Administered 2013-05-02: 10 mg via INTRAVENOUS
  Administered 2013-05-02: 5 mg via INTRAVENOUS
  Administered 2013-05-02: 10 mg via INTRAVENOUS

## 2013-05-02 MED ORDER — LACTATED RINGERS IV SOLN
INTRAVENOUS | Status: DC | PRN
  Administered 2013-05-02 (×3): via INTRAVENOUS

## 2013-05-02 MED ORDER — RAMIPRIL 10 MG PO CAPS
10.0000 mg | ORAL_CAPSULE | Freq: Every day | ORAL | Status: DC
  Administered 2013-05-03: 10 mg via ORAL
  Filled 2013-05-02: qty 1

## 2013-05-02 SURGICAL SUPPLY — 58 items
APPLIER CLIP 13 LRG OPEN (CLIP)
BAG HAMPER (MISCELLANEOUS) ×3 IMPLANT
CELLS DAT CNTRL 66122 CELL SVR (MISCELLANEOUS) IMPLANT
CLIP APPLIE 13 LRG OPEN (CLIP) IMPLANT
CLOTH BEACON ORANGE TIMEOUT ST (SAFETY) ×3 IMPLANT
COVER LIGHT HANDLE STERIS (MISCELLANEOUS) ×6 IMPLANT
COVER MAYO STAND XLG (DRAPE) ×3 IMPLANT
DECANTER SPIKE VIAL GLASS SM (MISCELLANEOUS) ×3 IMPLANT
DRAPE PROXIMA HALF (DRAPES) ×3 IMPLANT
DRAPE STERI URO 9X17 APER PCH (DRAPES) ×3 IMPLANT
DRAPE WARM FLUID 44X44 (DRAPE) ×3 IMPLANT
DRESSING TELFA 8X3 (GAUZE/BANDAGES/DRESSINGS) IMPLANT
ELECT REM PT RETURN 9FT ADLT (ELECTROSURGICAL) ×3
ELECTRODE REM PT RTRN 9FT ADLT (ELECTROSURGICAL) ×2 IMPLANT
FORMALIN 10 PREFIL 480ML (MISCELLANEOUS) ×3 IMPLANT
GAUZE PACKING 2X5 YD STERILE (GAUZE/BANDAGES/DRESSINGS) ×3 IMPLANT
GLOVE BIOGEL M STER SZ 6 (GLOVE) ×3 IMPLANT
GLOVE BIOGEL PI IND STRL 7.0 (GLOVE) ×4 IMPLANT
GLOVE BIOGEL PI IND STRL 9 (GLOVE) ×2 IMPLANT
GLOVE BIOGEL PI INDICATOR 7.0 (GLOVE) ×2
GLOVE BIOGEL PI INDICATOR 9 (GLOVE) ×1
GLOVE ECLIPSE 6.5 STRL STRAW (GLOVE) ×3 IMPLANT
GLOVE ECLIPSE 9.0 STRL (GLOVE) ×3 IMPLANT
GLOVE EXAM NITRILE MD LF STRL (GLOVE) ×3 IMPLANT
GLOVE INDICATOR STER SZ 9 (GLOVE) ×3 IMPLANT
GOWN STRL REIN 3XL LVL4 (GOWN DISPOSABLE) ×3 IMPLANT
GOWN STRL REIN XL XLG (GOWN DISPOSABLE) ×6 IMPLANT
INST SET MAJOR GENERAL (KITS) ×3 IMPLANT
IV NS IRRIG 3000ML ARTHROMATIC (IV SOLUTION) ×3 IMPLANT
KIT ROOM TURNOVER AP CYSTO (KITS) ×3 IMPLANT
KIT ROOM TURNOVER APOR (KITS) ×3 IMPLANT
MANIFOLD NEPTUNE II (INSTRUMENTS) ×3 IMPLANT
NEEDLE HYPO 25X1 1.5 SAFETY (NEEDLE) ×3 IMPLANT
NS IRRIG 1000ML POUR BTL (IV SOLUTION) ×6 IMPLANT
PACK ABDOMINAL MAJOR (CUSTOM PROCEDURE TRAY) IMPLANT
PACK PERI GYN (CUSTOM PROCEDURE TRAY) ×6 IMPLANT
PAD ARMBOARD 7.5X6 YLW CONV (MISCELLANEOUS) ×3 IMPLANT
RETRACTOR WND ALEXIS 25 LRG (MISCELLANEOUS) IMPLANT
RTRCTR WOUND ALEXIS 18CM MED (MISCELLANEOUS)
RTRCTR WOUND ALEXIS 25CM LRG (MISCELLANEOUS)
SET BASIN LINEN APH (SET/KITS/TRAYS/PACK) ×3 IMPLANT
STAPLER VISISTAT 35W (STAPLE) IMPLANT
SUT CHROMIC 0 CT 1 (SUTURE) ×27 IMPLANT
SUT CHROMIC 2 0 CT 1 (SUTURE) ×6 IMPLANT
SUT CHROMIC GUT BROWN 0 54 (SUTURE) IMPLANT
SUT CHROMIC GUT BROWN 0 54IN (SUTURE)
SUT MON AB 3-0 SH 27 (SUTURE) IMPLANT
SUT PROLENE 2 0 SH 30 (SUTURE) IMPLANT
SUT VIC AB 0 CT1 27 (SUTURE)
SUT VIC AB 0 CT1 27XBRD ANTBC (SUTURE) IMPLANT
SUT VIC AB 0 CT2 8-18 (SUTURE) ×6 IMPLANT
SUT VIC AB 0 CTX 36 (SUTURE)
SUT VIC AB 0 CTX36XBRD ANTBCTR (SUTURE) IMPLANT
SUT VICRYL 3 0 (SUTURE) IMPLANT
SYR CONTROL 10ML LL (SYRINGE) ×3 IMPLANT
TRAY FOLEY CATH 16FR SILVER (SET/KITS/TRAYS/PACK) ×3 IMPLANT
VERSALIGHT (MISCELLANEOUS) ×3 IMPLANT
WATER STERILE IRR 1000ML POUR (IV SOLUTION) ×3 IMPLANT

## 2013-05-02 NOTE — Anesthesia Postprocedure Evaluation (Signed)
  Anesthesia Post-op Note  Patient: Rebecca Gutierrez  Procedure(s) Performed: Procedure(s): HYSTERECTOMY VAGINAL (N/A) ANTERIOR (CYSTOCELE) AND POSTERIOR REPAIR (RECTOCELE) (N/A)  Patient Location: PACU  Anesthesia Type:Spinal  Level of Consciousness: awake, alert , oriented and patient cooperative  Airway and Oxygen Therapy: Patient Spontanous Breathing and Patient connected to nasal cannula oxygen  Post-op Pain: none  Post-op Assessment: Post-op Vital signs reviewed, Patient's Cardiovascular Status Stable, Respiratory Function Stable, Patent Airway, No signs of Nausea or vomiting and Pain level controlled  Post-op Vital Signs: Reviewed and stable  Complications: No apparent anesthesia complications

## 2013-05-02 NOTE — Brief Op Note (Signed)
05/02/2013  12:53 PM  PATIENT:  Rebecca Gutierrez  72 y.o. female  PRE-OPERATIVE DIAGNOSIS:   cystocele, uterine descensus, rectocele  POST-OPERATIVE DIAGNOSIS:  , cystocele, uterine descensus, rectocele  PROCEDURE:  Procedure(s): HYSTERECTOMY VAGINAL (N/A) ANTERIOR (CYSTOCELE) AND POSTERIOR REPAIR (RECTOCELE) (N/A)  SURGEON:  Surgeon(s) and Role:    * [name], MD - Primary  PHYSICIAN ASSISTANT:   ASSISTANTS: Barry Brunner, Trenton Founds CST   ANESTHESIA:   spinal, local  EBL:  Total I/O In: 2000 [I.V.:2000] Out: 320 [Urine:300; Blood:20]  BLOOD ADMINISTERED:none  DRAINS: Urinary Catheter (Foley) and Vaginal packing with Betadine soaked gauze   LOCAL MEDICATIONS USED:  MARCAINE    and Amount: 20 ml  SPECIMEN:  Source of Specimen:  Uterus and cervix  DISPOSITION OF SPECIMEN:  PATHOLOGY  COUNTS:  YES  TOURNIQUET:  * No tourniquets in log *  DICTATION: .Dragon Dictation  PLAN OF CARE: Admit for overnight observation  PATIENT DISPOSITION:  PACU - hemodynamically stable.   Delay start of Pharmacological VTE agent (>24hrs) due to surgical blood loss or risk of bleeding: yes

## 2013-05-02 NOTE — Interval H&P Note (Signed)
History and Physical Interval Note:  05/02/2013 10:17 AM  Rebecca Gutierrez  has presented today for surgery, with the diagnosis of uterine prolapse, cystocele, uterine descensus, rectocele  The various methods of treatment have been discussed with the patient and family. After consideration of risks, benefits and other options for treatment, the patient has consented to  Procedure(s): HYSTERECTOMY VAGINAL (N/A) SALPINGO OOPHORECTOMY (Bilateral) ANTERIOR (CYSTOCELE) AND POSTERIOR REPAIR (RECTOCELE) (N/A) as a surgical intervention .  The patient's history has been reviewed, patient examined, no change in status, stable for surgery.  I have reviewed the patient's chart and labs.  Questions were answered to the patient's satisfaction.     [name]

## 2013-05-02 NOTE — Progress Notes (Signed)
Day of Surgery Procedure(s) (LRB): HYSTERECTOMY VAGINAL (N/A) ANTERIOR (CYSTOCELE) AND POSTERIOR REPAIR (RECTOCELE) (N/A)  Subjective: Patient reports LLq mild discomfort. Tolerating po well..    Objective: I have reviewed patient's vital signs and medications. BP 128/56  Pulse 82  Temp(Src) 98.3 F (36.8 C) (Oral)  Resp 16  Ht 5\' 1"  (1.549 m)  Wt 137 lb 3 oz (62.228 kg)  BMI 25.93 kg/m2  SpO2 99%  General: alert, cooperative and no distress Vaginal Bleeding: none  Assessment: s/p Procedure(s): HYSTERECTOMY VAGINAL (N/A) ANTERIOR (CYSTOCELE) AND POSTERIOR REPAIR (RECTOCELE) (N/A): stable  Plan: Advance diet Encourage ambulation foley out in a.m.  LOS: 0 days    Rebecca Gutierrez V 05/02/2013, 8:33 PM

## 2013-05-02 NOTE — Anesthesia Procedure Notes (Addendum)
Performed by: Pernell Dupre, AMY A Pre-anesthesia Checklist: Patient identified, Timeout performed, Emergency Drugs available, Suction available and Patient being monitored Oxygen Delivery Method: Non-rebreather mask    Spinal  Patient location during procedure: OR Start time: 05/02/2013 10:44 AM Staffing CRNA/Resident: ADAMS, AMY A Preanesthetic Checklist Completed: patient identified, site marked, surgical consent, pre-op evaluation, timeout performed, IV checked, risks and benefits discussed and monitors and equipment checked Spinal Block Patient position: right lateral decubitus Prep: Betadine Patient monitoring: heart rate, cardiac monitor, continuous pulse ox and blood pressure Approach: right paramedian Location: L3-4 Injection technique: single-shot Needle Needle type: Spinocan  Needle gauge: 22 G Needle length: 9 cm Assessment Sensory level: T8 Additional Notes ATTEMPTS:1 TRAY ZO:[id number] TRAY EXPIRATION DATE:05/2013 Bupivacaine 15mg , Fentanyl , epi.1 injected intrathecally at 1044; patient tolerated well

## 2013-05-02 NOTE — H&P (View-Only) (Signed)
Patient ID: Rebecca Gutierrez, female DOB: [birth year], 72 y.o. MRN: [id number]  Rebecca Gutierrez is an 72 y.o. female. She is here as final preop before Vaginal Hysterectomy, anterior and posterior repair with bilateral salpingoophorectomy scheduled for 02 May 2013, for cystocele, uterine descensus second degree, with recurrent uti's and urinary incomplete voiding .  Pertinent Gynecological History:  Menses: post-menopausal  Bleeding: none  Contraception: post menopausal status  DES exposure: unknown  Blood transfusions: none  Sexually transmitted diseases: no past history  Previous GYN Procedures:  Last mammogram: normal Date: 07/2012  Last pap: normal Date: 07/2011,, repeated 04/17/13  OB History: G, P  Menstrual History:  Menarche age:  No LMP recorded. Patient is postmenopausal.  Past Medical History   Diagnosis  Date   .  Hypertension    .  Hearing loss    .  Gilbert's syndrome    .  Hyperlipidemia    .  Anxiety     Past Surgical History   Procedure  Laterality  Date   .  Tubal ligation     .  Eye surgery   02/13/2011     , right cataract extraction   .  Colonoscopy   08/28/2011     Procedure: COLONOSCOPY; Surgeon: Malissa Hippo, MD; Location: [facility]; Service: Endoscopy; Laterality: N/A; 930    Family History   Problem  Relation  Age of Onset   .  Colon cancer  Neg Hx    .  Hypertension  Mother    Social History: reports that she has never smoked. She has never used smokeless tobacco. She reports that she does not drink alcohol or use illicit drugs.  Allergies:  Allergies   Allergen  Reactions   .  [name]    (Not in a hospital admission)  ROSRecent uti tx'd Dr Lodema Hong Cipro, now converted to Macrodantin  Blood pressure 124/80, height 5\' 1"  (1.549 m), weight 129 lb (58.514 kg).  Physical Exam  Physical Examination: General appearance - alert, well appearing, and in no distress, oriented to person, place, and time and normal appearing  weight  Mental status - alert, oriented to person, place, and time, normal mood, behavior, speech, dress, motor activity, and thought processes, anxious  Eyes - pupils equal and reactive, extraocular eye movements intact  Ears - not examined, hard of HEARING, BILATERAL HEARING AIDS  Mouth - mucous membranes moist, pharynx normal without lesions and DENTURES UPPER AND LOWER  Neck - supple, no significant adenopathy  Chest - clear to auscultation, no wheezes, rales or rhonchi, symmetric air entry  Heart - normal rate and regular rhythm  Abdomen - soft, nontender, nondistended, no masses or organomegaly  Pelvic - VULVA: vulvar excoriation POSTERIOR FOURCHETTE  , VAGINA: normal appearing vagina with normal color and discharge, no lesions, atrophic, PELVIC FLOOR EXAM: cystocele TO INTROITUS, GOOD URETHRAL SUPPORT, uterine descensus grade 2, CERVIX: normal appearing cervix without discharge or lesions, PAP DONE AGAIN  UTERUS: uterus is normal size, shape, consistency and nontender, mobile, SMALL, ULTRASOUND SHOWS A THIN ENDOMETRIUM 4 MM THICKNESS WITHOUT IRREGULARITY  , ADNEXA: normal adnexa in size, nontender and no masses, no masses, RECTAL: normal rectal, no masses, thrombosed external hemorrhoids, PAP: Pap smear done today, saw liquid based cytology, exam chaperoned by kUNEFF DO.  Rectal - normal rectal, no masses  Extremities - peripheral pulses normal, no pedal edema, no clubbing or cyanosis, no pedal edema noted  No results found for this or any previous visit (from the  past 24 hour(s)).  No results found.  Assessment/Plan:  CYSTOCELE, TO INTROITUS, SECOND DEGREE UT DESCENSUS  PLAN: trANSVAGINAL HYSTERECTOMY, ANTERIOR AND POSTERIOR REPAIR, BILATERAL SALPINGOOPHORECTOMY.  sURGERY SCHEDULED FOR 02 May 2013.  Preop labs 04/26/13     Rebecca Gutierrez

## 2013-05-02 NOTE — Interval H&P Note (Signed)
History and Physical Interval Note:  05/02/2013 10:16 AM  Rebecca Gutierrez  has presented today for surgery, with the diagnosis of uterine prolapse, cystocele, uterine descensus, rectocele  The various methods of treatment have been discussed with the patient and family. After consideration of risks, benefits and other options for treatment, the patient has consented to  Procedure(s): HYSTERECTOMY VAGINAL (N/A) SALPINGO OOPHORECTOMY (Bilateral) ANTERIOR (CYSTOCELE) AND POSTERIOR REPAIR (RECTOCELE) (N/A) as a surgical intervention .  The patient's history has been reviewed, patient examined, no change in status, stable for surgery.  I have reviewed the patient's chart and labs.  Questions were answered to the patient's satisfaction.     [name]

## 2013-05-02 NOTE — Transfer of Care (Signed)
Immediate Anesthesia Transfer of Care Note  Patient: Rebecca Gutierrez  Procedure(s) Performed: Procedure(s): HYSTERECTOMY VAGINAL (N/A) ANTERIOR (CYSTOCELE) AND POSTERIOR REPAIR (RECTOCELE) (N/A)  Patient Location: PACU  Anesthesia Type:Spinal  Level of Consciousness: awake, alert , oriented and patient cooperative  Airway & Oxygen Therapy: Patient Spontanous Breathing and Patient connected to nasal cannula oxygen  Post-op Assessment: Report given to PACU RN and Post -op Vital signs reviewed and stable  Post vital signs: Reviewed and stable  Complications: No apparent anesthesia complications

## 2013-05-02 NOTE — Op Note (Signed)
05/02/2013  12:53 PM  PATIENT:  Rebecca Gutierrez  72 y.o. female  PRE-OPERATIVE DIAGNOSIS:   cystocele, uterine descensus, rectocele  POST-OPERATIVE DIAGNOSIS:  , cystocele, uterine descensus, rectocele  PROCEDURE:  Procedure(s): HYSTERECTOMY VAGINAL (N/A) ANTERIOR (CYSTOCELE) AND POSTERIOR REPAIR (RECTOCELE) (N/A)  SURGEON:  Surgeon(s) and Role:    * [name], MD - Primary  PHYSICIAN ASSISTANT:   ASSISTANTS: Estrellita Ludwig, Trenton Founds CST   ANESTHESIA:   spinal, local  EBL:  Total I/O In: 2000 [I.V.:2000] Out: 320 [Urine:300; Blood:20]  BLOOD ADMINISTERED:none  DRAINS: Urinary Catheter (Foley) and Vaginal packing with Betadine soaked gauze  Details of procedure: Patient was taken operating room spinal anesthesia introduced, abdomen and vagina prepped and draped. Timeout was conducted and Ancef administered. Weighted speculum was placed in the vagina, cervix grasped, the cystocele pushed upward, and the cervix circumscribed with the Bovie cautery after Marcaine was injected around the cervix as well. The bladder was elevated upward and held out of position with a vaginal retractor. posterior colpotomy incision was made and the uterosacral ligaments clamped cut and ligated, using 0 Lower cardinal ligaments were clamped cut and suture ligated on either side as were upper cardinal ligaments. With the vesicouterine reflection of peritoneum entered anteriorly, the broad ligament was serially clamped cut and suture ligated in small bites marching up each side of the uterus, and the pedicle including the round ligaments on either side was clamped to reapproximate the midline tissues. 0 chromic was used to reapproximate the vaginal cuff in a transverse fashion using interrupted sutures and good support was confirmed. Posterior repair: This consisted of double glove digital rectal exam confirming laxity of the posterior tissues,, regloving and splitting the vaginal epithelium  beginning at the level of hymen remnant and extending cephalad 3 cm of posterior vagina, dissecting laterally identifying good pararectal tissue on either side which could then be pulled into the midline with a series of 4 interrupted horizontal mattress sutures of 0 Vicryl. Significant improvement in the tone of the posterior perineal body was achieved. Vaginal epithelium was reapproximated with 2-0 chromic. Vaginal packing with Betadine soaked gauze was placed, and sponge and needle counts correct patient to recovery in stable condition.

## 2013-05-02 NOTE — Progress Notes (Signed)
Patient up to chair.  Tolerated well.  Moderate vaginal drainage on peri pad and bed pad.  Pad changed.

## 2013-05-02 NOTE — Progress Notes (Signed)
Bilateral hearing aides in place per pt.

## 2013-05-02 NOTE — Progress Notes (Signed)
Utilization review done-OIB.

## 2013-05-02 NOTE — Preoperative (Signed)
Beta Blockers   Reason not to administer Beta Blockers:Not Applicable 

## 2013-05-02 NOTE — Op Note (Signed)
States she took mag citrate with good results prior to surgery 05/01/2013

## 2013-05-02 NOTE — Anesthesia Preprocedure Evaluation (Addendum)
Anesthesia Evaluation  Patient identified by MRN, date of birth, ID band Patient awake    Reviewed: Allergy & Precautions, H&P , NPO status , Patient's Chart, lab work & pertinent test results, reviewed documented beta blocker date and time   Airway Mallampati: II TM Distance: >3 FB     Dental  (+) Edentulous Upper and Edentulous Lower   Pulmonary neg pulmonary ROS,  breath sounds clear to auscultation        Cardiovascular hypertension, Pt. on medications and Pt. on home beta blockers + dysrhythmias Rhythm:Regular Rate:Normal     Neuro/Psych PSYCHIATRIC DISORDERS Anxiety    GI/Hepatic negative GI ROS, Gilbert's Disease   Endo/Other    Renal/GU      Musculoskeletal   Abdominal   Peds  Hematology   Anesthesia Other Findings   Reproductive/Obstetrics                          Anesthesia Physical Anesthesia Plan  ASA: III  Anesthesia Plan: Spinal and MAC   Post-op Pain Management:    Induction: Intravenous  Airway Management Planned: Nasal Cannula  Additional Equipment:   Intra-op Plan:   Post-operative Plan:   Informed Consent: I have reviewed the patients History and Physical, chart, labs and discussed the procedure including the risks, benefits and alternatives for the proposed anesthesia with the patient or authorized representative who has indicated his/her understanding and acceptance.     Plan Discussed with:   Anesthesia Plan Comments:         Anesthesia Quick Evaluation

## 2013-05-03 ENCOUNTER — Encounter: Payer: BC Managed Care – HMO | Admitting: Obstetrics and Gynecology

## 2013-05-03 ENCOUNTER — Encounter (HOSPITAL_COMMUNITY): Payer: Self-pay | Admitting: Obstetrics and Gynecology

## 2013-05-03 LAB — CBC
HCT: 32.2 % — ABNORMAL LOW (ref 36.0–46.0)
MCH: 31.9 pg (ref 26.0–34.0)
MCHC: 35.7 g/dL (ref 30.0–36.0)
Platelets: 184 10*3/uL (ref 150–400)

## 2013-05-03 LAB — BASIC METABOLIC PANEL
Calcium: 9 mg/dL (ref 8.4–10.5)
GFR calc non Af Amer: 86 mL/min — ABNORMAL LOW (ref >90)
Sodium: 138 mEq/L (ref 135–145)

## 2013-05-03 MED ORDER — DOCUSATE SODIUM 100 MG PO CAPS: 100.0000 mg | ORAL_CAPSULE | Freq: Two times a day (BID) | ORAL | Status: DC | PRN

## 2013-05-03 MED ORDER — HYDROCODONE-ACETAMINOPHEN 5-325 MG PO TABS: 1.0000 | ORAL_TABLET | Freq: Four times a day (QID) | ORAL | Status: DC | PRN

## 2013-05-03 NOTE — Discharge Summary (Signed)
Physician Discharge Summary  Patient ID: LIVIA TARR MRN: [id number] DOB/AGE: 02-19-1941 72 y.o.  Admit date: 05/02/2013 Discharge date: 05/03/2013  Admission Diagnoses: Cystocele, second degree uterine descensus, rectocele  Discharge Diagnoses: Same, corrected Active Problems:   * No active hospital problems. *   Discharged Condition: good  Hospital Course: Patient was admitted through day surgery and underwent vaginal hysterectomy anterior and posterior repair. Efforts to remove the tubes and ovaries were not successful due to the tubes and ovaries being too high to be accessible vaginally. The left tube and ovary were secured by the bowel, with what appeared to be thin adhesions. Pathology on the uterus remains pending.  Consults: None  Significant Diagnostic Studies: labs:  CBC    Component Value Date/Time   WBC 9.9 05/03/2013 0551   RBC 3.60* 05/03/2013 0551   HGB 11.5* 05/03/2013 0551   HCT 32.2* 05/03/2013 0551   PLT 184 05/03/2013 0551   MCV 89.4 05/03/2013 0551   MCH 31.9 05/03/2013 0551   MCHC 35.7 05/03/2013 0551   RDW 12.4 05/03/2013 0551   LYMPHSABS 0.9 04/26/2013 1105   MONOABS 0.4 04/26/2013 1105   EOSABS 0.1 04/26/2013 1105   BASOSABS 0.0 04/26/2013 1105      Treatments: surgery: Vaginal hysterectomy anterior and posterior repair  Discharge Exam: Blood pressure 135/58, pulse 72, temperature 98.3 F (36.8 C), temperature source Oral, resp. rate 20, height 5\' 1"  (1.549 m), weight 137 lb 3 oz (62.228 kg), SpO2 93.00%. General appearance: alert, cooperative and no distress GI: soft, non-tender; bowel sounds normal; no masses,  no organomegaly Incision/Wound: no significant discomfort, no vaginal bleeding at this time  Disposition: 06-Home-Health Care Svc  Discharge Orders   Future Appointments Provider Department Dept Phone   05/17/2013 2:30 PM [name], MD [facility] [phone]   07/17/2013 8:30 AM Kerri Perches, MD  [facility] [phone]   Future Orders Complete By Expires   Call MD for:  As directed    Scheduling Instructions:     Temperature greater than 100.4, any sense that you're having more pelvic pain been previously experienced, each day  should be slightly better than the day before   Diet - low sodium heart healthy  As directed    Discharge instructions  As directed    Comments:     General Gynecological Post-Operative Instructions You may expect to feel dizzy, weak, and drowsy for as long as 24 hours after receiving the medicine that made you sleep (anesthetic). The following information pertains to your recovery period for the first 24 hours following surgery.  Do not drive a car, ride a bicycle, participate in physical activities, or take public transportation until you are done taking narcotic pain medicines or as directed by your caregiver.  Do not drink alcohol or take tranquilizers.  Do not take medicine that has not been prescribed by your caregiver.  Do not sign important papers or make important decisions while on narcotic pain medicines.  Have a responsible person with you.  CARE OF INCISION  Keep incision clean and dry. Take showers instead of baths until your caregiver gives you permission to take baths. Check with your caregiver if you have tubes coming from the wound site.  Avoid heavy lifting (more than 10 pounds/4.5 kilograms), pushing, or pulling.  Avoid activities that may risk injury to your surgical site.  Only take over-the-counter or prescription medicines for pain, discomfort, or fever as directed by your caregiver. Do not take aspirin.  It can make you bleed. Take medicines (antibiotics) that kill germs as directed.  Call the office or go to the MAU if:  You feel sick to your stomach (nauseous).  You start to throw up (vomit).  You have trouble eating or drinking.  You have an oral temperature above 100.4.  You have constipation that is not helped by  adjusting diet or increasing fluid intake. Pain medicines are a common cause of constipation.  SEEK IMMEDIATE MEDICAL CARE IF:  You have persistent dizziness.  You have difficulty breathing or a congested sounding (croupy) cough.  You have an oral temperature above 102.5, not controlled by medicine.  There is increasing pain or tenderness near or in the surgical site.  ExitCare Patient Information 2011 [place], [place].   Driving Restrictions  As directed    Comments:     No driving for 24 hours   Increase activity slowly  As directed    Lifting restrictions  As directed    Comments:     For the 4 weeks after surgery avoid lifting anything that requires future lock your breath and in order to lift it   Other Restrictions  As directed    Comments:     Use stool softener daily       Medication List         aspirin EC 81 MG tablet  Take 81 mg by mouth at bedtime.     docusate sodium 100 MG capsule  Commonly known as:  COLACE  Take 1 capsule (100 mg total) by mouth 2 (two) times daily as needed.     hydrochlorothiazide 25 MG tablet  Commonly known as:  HYDRODIURIL  Take 25 mg by mouth daily.     HYDROcodone-acetaminophen 5-325 MG per tablet  Commonly known as:  NORCO/VICODIN  Take 1 tablet by mouth every 6 (six) hours as needed for moderate pain.     metoprolol tartrate 25 MG tablet  Commonly known as:  LOPRESSOR  Take 25 mg by mouth 2 (two) times daily.     ramipril 10 MG capsule  Commonly known as:  ALTACE  Take 10 mg by mouth daily.     simvastatin 10 MG tablet  Commonly known as:  ZOCOR  Take 10 mg by mouth daily.         SignedTilda Burrow 05/03/2013, 7:03 AM

## 2013-05-03 NOTE — Progress Notes (Signed)
D.c instructions reviewed with patient and daughter.  Verbalized understanding.  Pt dc'd to home with daughter. [place], Candelaria Stagers 05/03/2013

## 2013-05-03 NOTE — Progress Notes (Signed)
1 Day Post-Op Procedure(s) (LRB): HYSTERECTOMY VAGINAL (N/A) ANTERIOR (CYSTOCELE) AND POSTERIOR REPAIR (RECTOCELE) (N/A)  Subjective: Patient reports incisional pain.  That is mild, only on the left side, described as dull. Foley out, hasn't voided yet  Objective: I have reviewed patient's vital signs, intake and output and labs. BP 127/54  Pulse 101  Temp(Src) 99.5 F (37.5 C) (Oral)  Resp 18  Ht 5\' 1"  (1.549 m)  Wt 137 lb 3 oz (62.228 kg)  BMI 25.93 kg/m2  SpO2 93%  General: alert, cooperative and no distress GI: soft, non-tender; bowel sounds normal; no masses,  no organomegaly Vaginal Bleeding: minimal CBC    Component Value Date/Time   WBC 9.9 05/03/2013 0551   RBC 3.60* 05/03/2013 0551   HGB 11.5* 05/03/2013 0551   HCT 32.2* 05/03/2013 0551   PLT 184 05/03/2013 0551   MCV 89.4 05/03/2013 0551   MCH 31.9 05/03/2013 0551   MCHC 35.7 05/03/2013 0551   RDW 12.4 05/03/2013 0551   LYMPHSABS 0.9 04/26/2013 1105   MONOABS 0.4 04/26/2013 1105   EOSABS 0.1 04/26/2013 1105   BASOSABS 0.0 04/26/2013 1105     Assessment: s/p Procedure(s): HYSTERECTOMY VAGINAL (N/A) ANTERIOR (CYSTOCELE) AND POSTERIOR REPAIR (RECTOCELE) (N/A): stable and progressing well  Plan: Advance diet Discontinue IV fluids Discharge home  LOS: 1 day    Rebecca Gutierrez 05/03/2013, 6:54 AM

## 2013-05-03 NOTE — Progress Notes (Signed)
Vaginal packing removed. Patient tolerated well.  

## 2013-05-03 NOTE — Anesthesia Postprocedure Evaluation (Signed)
  Anesthesia Post-op Note  Patient: Rebecca Gutierrez  Procedure(s) Performed: Procedure(s): HYSTERECTOMY VAGINAL (N/A) ANTERIOR (CYSTOCELE) AND POSTERIOR REPAIR (RECTOCELE) (N/A)  Patient Location: room 328  Anesthesia Type:Spinal  Level of Consciousness: awake, alert , oriented and patient cooperative  Airway and Oxygen Therapy: Patient Spontanous Breathing  Post-op Pain: 2 /10, mild  Post-op Assessment: Post-op Vital signs reviewed, Patient's Cardiovascular Status Stable, Respiratory Function Stable, Patent Airway and Pain level controlled  Post-op Vital Signs: Reviewed and stable  Complications: No apparent anesthesia complications

## 2013-05-08 NOTE — Assessment & Plan Note (Signed)
z pack prescribed 

## 2013-05-08 NOTE — Assessment & Plan Note (Signed)
Remains uncontrolled, pt resists change in medication dose stating she always gets good numbers

## 2013-05-08 NOTE — Assessment & Plan Note (Signed)
A pack prescribed

## 2013-05-10 ENCOUNTER — Encounter: Payer: BC Managed Care – HMO | Admitting: Obstetrics and Gynecology

## 2013-05-13 ENCOUNTER — Other Ambulatory Visit: Payer: Self-pay | Admitting: Family Medicine

## 2013-05-15 ENCOUNTER — Encounter: Payer: MEDICARE | Admitting: Family Medicine

## 2013-05-17 ENCOUNTER — Ambulatory Visit (INDEPENDENT_AMBULATORY_CARE_PROVIDER_SITE_OTHER): Payer: BC Managed Care – HMO | Admitting: Obstetrics and Gynecology

## 2013-05-17 ENCOUNTER — Encounter: Payer: Self-pay | Admitting: Obstetrics and Gynecology

## 2013-05-17 VITALS — BP 120/76 | Ht 62.0 in | Wt 125.0 lb

## 2013-05-17 DIAGNOSIS — Z9889 Other specified postprocedural states: Secondary | ICD-10-CM

## 2013-05-17 NOTE — Progress Notes (Signed)
Patient ID: Rebecca Gutierrez, female   DOB: [birth year], 72 y.o.   MRN: [id number] Pt states surgery went very well, "didn't even feel like I had surgery". No pain medication used except what was given in the hospital.  Assessment:  Post-Op status post vaginal hysterectomy, anterior colporrhaphy and posterior colporrhaphy for pelvic relaxation and cystocele, rectocele.2 weeks ago.  Doing well postoperatively. Adhesions from ant to post repair disrupted   Plan:  1. Continue any current medications. 2. Wound care discussed. Use Premarin VC to vagina 4x/wk, given samples 3. Activity restrictions: no lifting more than 15 pounds 4. Anticipated return to work: 4 weeks. 5. Follow up in 4 week.  Subjective:  Rebecca Gutierrez is a 72 y.o. female who presents to the clinic 2 weeks status post vaginal hysterectomy, anterior colporrhaphy and posterior colporrhaphy.  She has been eating a regular diet without difficulty.  Bowel movements are normal. The patient is not having any pain.  Review of Systems Negative except as per HPI   Objective:  BP 120/76  Ht 5\' 2"  (1.575 m)  Wt 125 lb (56.7 kg)  BMI 22.86 kg/m2 General:Well developed, well nourished.  No acute distress. Abdomen: Bowel sounds normal, soft, non-tender. Pelvic Exam: External Genitalia:  Normal. Vagina: anterior and post repair are trying to agglutinate, broken up , will rx premarin vc Bimanual: Normal Cervix:absent  Uterus: absent  Cuff a bit stiff , as expected for 2 wk Adnexa: normal Incision(s):Healing well, no drainage, no erythema, no hernia, no swelling, no dehiscence, incision well approximated.

## 2013-05-17 NOTE — Patient Instructions (Signed)
Conjugated Estrogens vaginal cream What is this medicine? CONJUGATED ESTROGENS (CON ju gate ed ESS troe jenz) are a mixture of female hormones. This cream can help relieve symptoms associated with menopause.like vaginal dryness and irritation. This medicine may be used for other purposes; ask your health care provider or pharmacist if you have questions. COMMON BRAND NAME(S): Premarin What should I tell my health care provider before I take this medicine? They need to know if you have any of these conditions: -abnormal vaginal bleeding -blood vessel disease or blood clots -breast, cervical, endometrial, or uterine cancer -dementia -diabetes -gallbladder disease -heart disease or recent heart attack -high blood pressure -high cholesterol -high level of calcium in the blood -hysterectomy -kidney disease -liver disease -migraine headaches -protein C deficiency -protein S deficiency -stroke -systemic lupus erythematosus (SLE) -tobacco smoker -an unusual or allergic reaction to estrogens other medicines, foods, dyes, or preservatives -pregnant or trying to get pregnant -breast-feeding How should I use this medicine? This medicine is for use in the vagina only. Do not take by mouth. Follow the directions on the prescription label. Use at bedtime unless otherwise directed by your doctor or health care professional. Use the special applicator supplied with the cream. Wash hands before and after use. Fill the applicator with the cream and remove from the tube. Lie on your back, part and bend your knees. Insert the applicator into the vagina and push the plunger to expel the cream into the vagina. Wash the applicator with warm soapy water and rinse well. Use exactly as directed for the complete length of time prescribed. Do not stop using except on the advice of your doctor or health care professional. Talk to your pediatrician regarding the use of this medicine in children. Special care may be  needed. A patient package insert for the product will be given with each prescription and refill. Read this sheet carefully each time. The sheet may change frequently. Overdosage: If you think you have taken too much of this medicine contact a poison control center or emergency room at once. NOTE: This medicine is only for you. Do not share this medicine with others. What if I miss a dose? If you miss a dose, use it as soon as you can. If it is almost time for your next dose, use only that dose. Do not use double or extra doses. What may interact with this medicine? Do not take this medicine with any of the following medications: -aromatase inhibitors like aminoglutethimide, anastrozole, exemestane, letrozole, testolactone This medicine may also interact with the following medications: -barbiturates used for inducing sleep or treating seizures -carbamazepine -grapefruit juice -medicines for fungal infections like itraconazole and ketoconazole -raloxifene or tamoxifen -rifabutin -rifampin -rifapentine -ritonavir -some antibiotics used to treat infections -St. Kru Allman's Wort -warfarin This list may not describe all possible interactions. Give your health care provider a list of all the medicines, herbs, non-prescription drugs, or dietary supplements you use. Also tell them if you smoke, drink alcohol, or use illegal drugs. Some items may interact with your medicine. What should I watch for while using this medicine? Visit your health care professional for regular checks on your progress. You will need a regular breast and pelvic exam. You should also discuss the need for regular mammograms with your health care professional, and follow his or her guidelines. This medicine can make your body retain fluid, making your fingers, hands, or ankles swell. Your blood pressure can go up. Contact your doctor or health care professional if you   feel you are retaining fluid. If you have any reason to think  you are pregnant; stop taking this medicine at once and contact your doctor or health care professional. Tobacco smoking increases the risk of getting a blood clot or having a stroke, especially if you are more than 70 years old. You are strongly advised not to smoke. If you wear contact lenses and notice visual changes, or if the lenses begin to feel uncomfortable, consult your eye care specialist. If you are going to have elective surgery, you may need to stop taking this medicine beforehand. Consult your health care professional for advice prior to scheduling the surgery. What side effects may I notice from receiving this medicine? Side effects that you should report to your doctor or health care professional as soon as possible: -allergic reactions like skin rash, itching or hives, swelling of the face, lips, or tongue -breast tissue changes or discharge -changes in vision -chest pain -confusion, trouble speaking or understanding -dark urine -general ill feeling or flu-like symptoms -light-colored stools -nausea, vomiting -pain, swelling, warmth in the leg -right upper belly pain -severe headaches -shortness of breath -sudden numbness or weakness of the face, arm or leg -trouble walking, dizziness, loss of balance or coordination -unusual vaginal bleeding -yellowing of the eyes or skin Side effects that usually do not require medical attention (report to your doctor or health care professional if they continue or are bothersome): -hair loss -increased hunger or thirst -increased urination -symptoms of vaginal infection like itching, irritation or unusual discharge -unusually weak or tired This list may not describe all possible side effects. Call your doctor for medical advice about side effects. You may report side effects to FDA at 1-800-FDA-1088. Where should I keep my medicine? Keep out of the reach of children. Store at room temperature between 15 and 30 degrees C (59 and 86  degrees F). Throw away any unused medicine after the expiration date. NOTE: This sheet is a summary. It may not cover all possible information. If you have questions about this medicine, talk to your doctor, pharmacist, or health care provider.  2014, [employer]vier/Gold Standard. (2010-09-03 09:20:36)  

## 2013-06-06 ENCOUNTER — Telehealth: Payer: Self-pay | Admitting: Obstetrics & Gynecology

## 2013-06-06 ENCOUNTER — Other Ambulatory Visit: Payer: Self-pay | Admitting: Obstetrics and Gynecology

## 2013-06-06 ENCOUNTER — Telehealth: Payer: Self-pay

## 2013-06-06 DIAGNOSIS — N390 Urinary tract infection, site not specified: Secondary | ICD-10-CM

## 2013-06-06 MED ORDER — CIPROFLOXACIN HCL 500 MG PO TABS: 500.0000 mg | ORAL_TABLET | Freq: Two times a day (BID) | ORAL | Status: DC

## 2013-06-06 NOTE — Telephone Encounter (Signed)
Patient called stating that she thinks that she has a UTI.  Hx of recurrent UTIs.  Is asking for a refill of Cipro.  No provider in office.  Advised to contact [facility] as they have also treated her bladder.

## 2013-06-06 NOTE — Telephone Encounter (Signed)
Pt states believes she has a bladder infection, requesting RX

## 2013-06-14 ENCOUNTER — Ambulatory Visit (INDEPENDENT_AMBULATORY_CARE_PROVIDER_SITE_OTHER): Payer: BC Managed Care – HMO | Admitting: Obstetrics and Gynecology

## 2013-06-14 ENCOUNTER — Encounter: Payer: Self-pay | Admitting: Obstetrics and Gynecology

## 2013-06-14 VITALS — BP 128/72 | Ht 62.0 in | Wt 128.0 lb

## 2013-06-14 DIAGNOSIS — Z9889 Other specified postprocedural states: Secondary | ICD-10-CM

## 2013-06-14 NOTE — Patient Instructions (Signed)
Delay heavy lifting til 8 wks postop; will do u/s in 3months.to check left adnexal cyst.

## 2013-06-14 NOTE — Progress Notes (Signed)
Patient ID: Rebecca Gutierrez, female   DOB: 1940/08/02, 72 y.o.   MRN: [id number] Pt is 6 weeks out from surgery. Pt denies any problems or concerns at this time.   no pain, normal BM's. Voiding well. Had an episode of uti sx, tx'd cipro with resolution.  Subjective:  Rebecca Gutierrez is a 72 y.o. female who presents to the clinic 4 weeks status post vaginal hysterectomy, anterior colporrhaphy and posterior colporrhaphy.    Review of Systems Negative except   She has been eating a regular diet without difficulty.   Bowel movements are normal. The patient is not having any pain.  Objective:  BP 128/72  Ht 5\' 2"  (1.575 m)  Wt 128 lb (58.06 kg)  BMI 23.41 kg/m2 General:Well developed, well nourished.  No acute distress. Abdomen: Bowel sounds normal, soft, non-tender. Pelvic Exam:    External Genitalia:  Normal.    Vagina: Normal    Bimanual: Normal    Cervix: absentNormal    Uterus: absentNormal    Adnexa: no massses notable.pt had a small cyst on left adnexa that couldn't be seen at surgery, will follow up in 3-6 months. Incision(s):   Healing well, no drainage, no erythema, no hernia, no swelling, no dehiscence, incision well approximated.   Assessment:  Post-Op 4 weeks s/p vaginal hysterectomy and anterior colporrhaphy  And posterior repair  hx cyst on left adnexa will f/u u/s in 3 months for pt reassurance Doing well postoperatively.   Plan:  1.Wound care discussed  Use vag estrogen 2x/wk 2. .Continue any current medications. 3. Activity restrictions: no lifting more than 15pounds til 8 wks pounds 4. return to work: not applicable. 5. Follow up in 3  months.

## 2013-07-14 ENCOUNTER — Ambulatory Visit (INDEPENDENT_AMBULATORY_CARE_PROVIDER_SITE_OTHER): Payer: BC Managed Care – HMO

## 2013-07-14 ENCOUNTER — Other Ambulatory Visit: Payer: Self-pay | Admitting: Family Medicine

## 2013-07-14 VITALS — Wt 129.0 lb

## 2013-07-14 DIAGNOSIS — N3000 Acute cystitis without hematuria: Secondary | ICD-10-CM

## 2013-07-14 LAB — POCT URINALYSIS DIPSTICK
BILIRUBIN UA: NEGATIVE
GLUCOSE UA: NEGATIVE
KETONES UA: NEGATIVE
Nitrite, UA: POSITIVE
Protein, UA: 30
Spec Grav, UA: 1.015
Urobilinogen, UA: 0.2
pH, UA: 6

## 2013-07-14 MED ORDER — CIPROFLOXACIN HCL 500 MG PO TABS: 500.0000 mg | ORAL_TABLET | Freq: Two times a day (BID) | ORAL | Status: DC

## 2013-07-14 NOTE — Progress Notes (Signed)
Urine checked in office and was positive for infection. Advised her to check with the pharmacy later for her meds

## 2013-07-17 ENCOUNTER — Ambulatory Visit: Payer: BC Managed Care – HMO | Admitting: Family Medicine

## 2013-07-17 ENCOUNTER — Other Ambulatory Visit: Payer: Self-pay | Admitting: Family Medicine

## 2013-07-17 LAB — URINE CULTURE

## 2013-07-24 ENCOUNTER — Telehealth: Payer: Self-pay | Admitting: Family Medicine

## 2013-07-24 MED ORDER — SIMVASTATIN 10 MG PO TABS: 10.0000 mg | ORAL_TABLET | Freq: Every day | ORAL | Status: DC

## 2013-07-24 NOTE — Telephone Encounter (Signed)
[facility]

## 2013-08-09 ENCOUNTER — Other Ambulatory Visit: Payer: Self-pay | Admitting: Family Medicine

## 2013-08-21 ENCOUNTER — Other Ambulatory Visit: Payer: Self-pay | Admitting: Family Medicine

## 2013-08-21 DIAGNOSIS — Z1231 Encounter for screening mammogram for malignant neoplasm of breast: Secondary | ICD-10-CM

## 2013-08-22 ENCOUNTER — Telehealth: Payer: Self-pay | Admitting: Family Medicine

## 2013-08-22 DIAGNOSIS — I10 Essential (primary) hypertension: Secondary | ICD-10-CM

## 2013-08-22 DIAGNOSIS — E785 Hyperlipidemia, unspecified: Secondary | ICD-10-CM

## 2013-08-22 NOTE — Addendum Note (Signed)
Addended by: Denman George B on: 08/22/2013 04:40 PM   Modules accepted: Orders

## 2013-08-22 NOTE — Telephone Encounter (Signed)
No current labs noted for patient.  Please advise.

## 2013-08-22 NOTE — Telephone Encounter (Signed)
Labs ordered and sent to lab

## 2013-08-22 NOTE — Telephone Encounter (Signed)
pls order cbc, fasting lipid and cmp, dx HTN, hyperpipid and anemia

## 2013-08-24 ENCOUNTER — Other Ambulatory Visit (HOSPITAL_COMMUNITY): Payer: Self-pay | Admitting: Pulmonary Disease

## 2013-08-24 ENCOUNTER — Ambulatory Visit (HOSPITAL_COMMUNITY)
Admission: RE | Admit: 2013-08-24 | Discharge: 2013-08-24 | Disposition: A | Discharge status: Home / Self Care | Payer: MEDICARE | Source: Ambulatory Visit | Attending: Family Medicine | Admitting: Family Medicine

## 2013-08-24 DIAGNOSIS — Z1231 Encounter for screening mammogram for malignant neoplasm of breast: Secondary | ICD-10-CM

## 2013-08-24 LAB — COMPREHENSIVE METABOLIC PANEL
ALT: 21 U/L (ref 0–35)
AST: 19 U/L (ref 0–37)
Albumin: 4.3 g/dL (ref 3.5–5.2)
Alkaline Phosphatase: 52 U/L (ref 39–117)
BUN: 17 mg/dL (ref 6–23)
CALCIUM: 9.2 mg/dL (ref 8.4–10.5)
CHLORIDE: 101 meq/L (ref 96–112)
CO2: 30 mEq/L (ref 19–32)
Creat: 0.81 mg/dL (ref 0.50–1.10)
Glucose, Bld: 94 mg/dL (ref 70–99)
Potassium: 3.6 mEq/L (ref 3.5–5.3)
Sodium: 138 mEq/L (ref 135–145)
Total Bilirubin: 1.4 mg/dL — ABNORMAL HIGH (ref 0.2–1.2)
Total Protein: 6.6 g/dL (ref 6.0–8.3)

## 2013-08-24 LAB — CBC
HCT: 39.7 % (ref 36.0–46.0)
Hemoglobin: 13.7 g/dL (ref 12.0–15.0)
MCH: 30.2 pg (ref 26.0–34.0)
MCHC: 34.5 g/dL (ref 30.0–36.0)
MCV: 87.6 fL (ref 78.0–100.0)
Platelets: 217 10*3/uL (ref 150–400)
RBC: 4.53 MIL/uL (ref 3.87–5.11)
RDW: 13.4 % (ref 11.5–15.5)
WBC: 2.9 10*3/uL — AB (ref 4.0–10.5)

## 2013-08-24 LAB — LIPID PANEL
CHOLESTEROL: 188 mg/dL (ref 0–200)
HDL: 66 mg/dL (ref >39)
LDL Cholesterol: 100 mg/dL — ABNORMAL HIGH (ref 0–99)
Total CHOL/HDL Ratio: 2.8 Ratio
Triglycerides: 110 mg/dL (ref <150)
VLDL: 22 mg/dL (ref 0–40)

## 2013-08-31 ENCOUNTER — Other Ambulatory Visit: Payer: Self-pay

## 2013-08-31 ENCOUNTER — Encounter: Payer: Self-pay | Admitting: Family Medicine

## 2013-08-31 ENCOUNTER — Ambulatory Visit (INDEPENDENT_AMBULATORY_CARE_PROVIDER_SITE_OTHER): Payer: BC Managed Care – HMO | Admitting: Family Medicine

## 2013-08-31 ENCOUNTER — Ambulatory Visit: Payer: MEDICARE | Admitting: Family Medicine

## 2013-08-31 VITALS — BP 136/72 | HR 72 | Resp 18 | Wt 130.1 lb

## 2013-08-31 DIAGNOSIS — Z0000 Encounter for general adult medical examination without abnormal findings: Secondary | ICD-10-CM

## 2013-08-31 DIAGNOSIS — Z13820 Encounter for screening for osteoporosis: Secondary | ICD-10-CM

## 2013-08-31 DIAGNOSIS — E785 Hyperlipidemia, unspecified: Secondary | ICD-10-CM

## 2013-08-31 DIAGNOSIS — I10 Essential (primary) hypertension: Secondary | ICD-10-CM

## 2013-08-31 DIAGNOSIS — H547 Unspecified visual loss: Secondary | ICD-10-CM | POA: Insufficient documentation

## 2013-08-31 NOTE — Progress Notes (Signed)
Subjective:    Patient ID: Rebecca Gutierrez, female    DOB: [birth year], 73 y.o.   MRN: [id number]  HPI  Preventive Screening-Counseling & Management   Patient present here today for a Medicare annual wellness visit.   Current Problems (verified)   Medications Prior to Visit Allergies (verified)   PAST HISTORY  Family History  Social History    Risk Factors  Current exercise habits: dedicated exercise during warm months, gardening, needs to commit to year round activity. Encouraged to look into silver sneakers program which she has access o  Dietary issues discussed:heart healthy , low in sugar, rich in vegetable and fruit   Cardiac risk factors: None significant  Depression Screen  (Note: if answer to either of the following is "Yes", a more complete depression screening is indicated)   Over the past two weeks, have you felt down, depressed or hopeless? No  Over the past two weeks, have you felt little interest or pleasure in doing things? No  Have you lost interest or pleasure in daily life? No  Do you often feel hopeless? No  Do you cry easily over simple problems? No   Activities of Daily Living  In your present state of health, do you have any difficulty performing the following activities?  Driving?: No Managing money?: No Feeding yourself?:No Getting from bed to chair?:No Climbing a flight of stairs?:No Preparing food and eating?:No Bathing or showering?:No Getting dressed?:No Getting to the toilet?:No Using the toilet?:No Moving around from place to place?: No  Fall Risk Assessment In the past year have you fallen or had a near fall?:No Are you currently taking any medications that make you dizzy?:No   Hearing Difficulties: No Do you often ask people to speak up or repeat themselves?:yes , has hearing loss, nerve damage, since age 22 Do you experience ringing or noises in your ears?:No Do you have difficulty understanding soft or whispered  voices?:yes  Cognitive Testing  Alert? Yes Normal Appearance?Yes  Oriented to person? Yes Place? Yes  Time? Yes  Displays appropriate judgment?Yes  Can read the correct time from a watch face? yes Are you having problems remembering things?No  Advanced Directives have been discussed with the patient?Yes , full code, also provided her with further reading material   List the Names of Other Physician/Practitioners you currently use:    Indicate any recent Medical Services you may have received from other than [facility] providers in the past year (date may be approximate).   Assessment:    Annual Wellness Exam   Plan:    During the course of the visit the patient was educated and counseled about appropriate screening and preventive services including:  A healthy diet is rich in fruit, vegetables and whole grains. Poultry fish, nuts and beans are a healthy choice for protein rather then red meat. A low sodium diet and drinking 64 ounces of water daily is generally recommended. Oils and sweet should be limited. Carbohydrates especially for those who are diabetic or overweight, should be limited to 30-45 gram per meal. It is important to eat on a regular schedule, at least 3 times daily. Snacks should be primarily fruits, vegetables or nuts. It is important that you exercise regularly at least 30 minutes 5 times a week. If you develop chest pain, have severe difficulty breathing, or feel very tired, stop exercising immediately and seek medical attention  Immunization reviewed and updated. Cancer screening reviewed and updated    Patient Instructions (the written plan) was  given to the patient.  Medicare Attestation  I have personally reviewed:  The patient's medical and social history  Their use of alcohol, tobacco or illicit drugs  Their current medications and supplements  The patient's functional ability including ADLs,fall risks, home safety risks, cognitive, and hearing and visual  impairment  Diet and physical activities  Evidence for depression or mood disorders  The patient's weight, height, BMI, and visual acuity have been recorded in the chart. I have made referrals, counseling, and provided education to the patient based on review of the above and I have provided the patient with a written personalized care plan for preventive services.     Review of Systems     Objective:   Physical Exam        Assessment & Plan:

## 2013-08-31 NOTE — Patient Instructions (Addendum)
Annual physical exam Fall Prevention and Home Safety Falls cause injuries and can affect all age groups. It is possible to prevent falls.  HOW TO PREVENT FALLS  Wear shoes with rubber soles that do not have an opening for your toes.  Keep the inside and outside of your house well lit.  Use night lights throughout your home.  Remove clutter from floors.  Clean up floor spills.  Remove throw rugs or fasten them to the floor with carpet tape.  Do not place electrical cords across pathways.  Put grab bars by your tub, shower, and toilet. Do not use towel bars as grab bars.  Put handrails on both sides of the stairway. Fix loose handrails.  Do not climb on stools or stepladders, if possible.  Do not wax your floors.  Repair uneven or unsafe sidewalks, walkways, or stairs.  Keep items you use a lot within reach.  Be aware of pets.  Keep emergency numbers next to the telephone.  Put smoke detectors in your home and near bedrooms. Ask your doctor what other things you can do to prevent falls. Document Released: 03/28/2009 Document Revised: 12/01/2011 Document Reviewed: 09/01/2011 ExitCare Patient Information 2014 [place]. first week in October, call if you need me before  Please review and discuss advanced directives with your children as we discussed.  You are referred for bone density test.  Important to take calcium with D 100 to 1200mg  calcium with 600 to 800IU vit D daily  Fasting lipid, cmp , and TSH in October before visit  Please commit to daily exercise

## 2013-09-02 NOTE — Assessment & Plan Note (Signed)
Annual exam as documented. Counseling done  re healthy lifestyle involving commitment to 150 minutes exercise per week, heart healthy diet, and attaining healthy weight.The importance of adequate sleep also discussed. Regular seat belt use and safe storage  of firearms if patient has them, is also discussed. Changes in health habits are decided on by the patient with goals and time frames  set for achieving them. Immunization and cancer screening needs are specifically addressed at this visit. Advanced directives discussed, she is full code status. Fall risk reduction discussed also

## 2013-09-13 ENCOUNTER — Other Ambulatory Visit (HOSPITAL_COMMUNITY): Payer: MEDICARE

## 2013-09-13 ENCOUNTER — Encounter: Payer: Self-pay | Admitting: Obstetrics and Gynecology

## 2013-09-13 ENCOUNTER — Ambulatory Visit (INDEPENDENT_AMBULATORY_CARE_PROVIDER_SITE_OTHER): Payer: BC Managed Care – HMO | Admitting: Obstetrics and Gynecology

## 2013-09-13 ENCOUNTER — Ambulatory Visit (INDEPENDENT_AMBULATORY_CARE_PROVIDER_SITE_OTHER): Payer: BC Managed Care – HMO

## 2013-09-13 DIAGNOSIS — IMO0002 Reserved for concepts with insufficient information to code with codable children: Secondary | ICD-10-CM | POA: Insufficient documentation

## 2013-09-13 DIAGNOSIS — N8111 Cystocele, midline: Secondary | ICD-10-CM

## 2013-09-13 DIAGNOSIS — Z9889 Other specified postprocedural states: Secondary | ICD-10-CM

## 2013-09-13 DIAGNOSIS — N83209 Unspecified ovarian cyst, unspecified side: Secondary | ICD-10-CM

## 2013-09-13 HISTORY — DX: Reserved for concepts with insufficient information to code with codable children: IMO0002

## 2013-09-13 NOTE — Progress Notes (Signed)
This chart was scribed by Jenne Campus, Medical Scribe, for Dr. Mallory Shirk on 09/13/13 at 10:03 AM. This chart was reviewed by Dr. Mallory Shirk and is accurate.   [facility] Visit  Patient name: Rebecca Gutierrez MRN [id number]  Date of birth: June 23, 1940  CC & HPI:  Rebecca Gutierrez is a 73 y.o. female presenting today for f/u on hx cyst on left adnexa for pt reassurance. Also reports seeing a bulge inside the vagina similar to prior cystocele. States has to lean forward to complete urination. No other urinary sxs. No problems with BMs. Not on Estrogen cream currently.   Transvaginal U/S 09/13/13: Surgically absent uterus, vag cuff appears WNL, post-void residual bladder noted, bilateral adnexa/ovaries appears WNL no free fluid or adnexal masses noted within pelvis transabdominally or transvaginally   ROS:  ?cystocele No other complaints  Pertinent History Reviewed:  Medical & Surgical Hx:  Reviewed: Significant for vaginal hysterectomy, anterior and posterior repair 05/02/13 Medications: Reviewed & Updated - see associated section Social History: Reviewed -  reports that she has never smoked. She has never used smokeless tobacco.  Objective Findings:  Vitals: There were no vitals taken for this visit. Chaperone present for exam which was performed with pt's permission Physical Examination: General appearance - alert, well appearing, and in no distress and oriented to person, place, and time Mental status - alert, oriented to person, place, and time Pelvic - VULVA: normal appearing vulva with no masses, tenderness or lesions, VAGINA: atrophic mucosa, normal appearing vagina with normal color and discharge, no lesions PELVIC FLOOR EXAM: small recurrent cystocele, less than before. Good rectal support. Good vaginal apex support CERVIX: normal appearing cervix without discharge or lesions, UTERUS: surgically absent, vaginal cuff well healed, ADNEXA: no masses or tenderness  noted   Assessment & Plan:  A: 1. Small recurrent cystocele, less than before   P: 1. Pt will return PRN or in 1 year for recheck Consider twice weekly estrogen per vagina, pt not desiring this at present

## 2013-09-13 NOTE — Patient Instructions (Addendum)
Please return if you begin to have multiple, recurrent bladder infections. Ultrasound today is reassuring. Symptoms are most likely from weakening after the stitches have resolved.

## 2013-09-14 ENCOUNTER — Other Ambulatory Visit (HOSPITAL_COMMUNITY): Payer: MEDICARE

## 2013-09-22 ENCOUNTER — Ambulatory Visit (HOSPITAL_COMMUNITY)
Admission: RE | Admit: 2013-09-22 | Discharge: 2013-09-22 | Disposition: A | Discharge status: Home / Self Care | Payer: MEDICARE | Source: Ambulatory Visit | Attending: Family Medicine | Admitting: Family Medicine

## 2013-09-22 DIAGNOSIS — Z13820 Encounter for screening for osteoporosis: Secondary | ICD-10-CM | POA: Insufficient documentation

## 2013-09-24 ENCOUNTER — Other Ambulatory Visit: Payer: Self-pay | Admitting: Family Medicine

## 2013-10-02 ENCOUNTER — Encounter: Payer: Self-pay | Admitting: Family Medicine

## 2013-12-11 ENCOUNTER — Encounter: Payer: Self-pay | Admitting: Family Medicine

## 2013-12-11 ENCOUNTER — Ambulatory Visit (HOSPITAL_COMMUNITY)
Admission: RE | Admit: 2013-12-11 | Discharge: 2013-12-11 | Disposition: A | Discharge status: Home / Self Care | Payer: MEDICARE | Source: Ambulatory Visit | Attending: Family Medicine | Admitting: Family Medicine

## 2013-12-11 ENCOUNTER — Ambulatory Visit (INDEPENDENT_AMBULATORY_CARE_PROVIDER_SITE_OTHER): Payer: MEDICARE | Admitting: Family Medicine

## 2013-12-11 ENCOUNTER — Telehealth: Payer: Self-pay

## 2013-12-11 VITALS — BP 142/80 | HR 100 | Temp 100.1°F | Resp 18 | Ht 62.0 in | Wt 129.0 lb

## 2013-12-11 DIAGNOSIS — J209 Acute bronchitis, unspecified: Secondary | ICD-10-CM

## 2013-12-11 DIAGNOSIS — I10 Essential (primary) hypertension: Secondary | ICD-10-CM

## 2013-12-11 DIAGNOSIS — J4489 Other specified chronic obstructive pulmonary disease: Secondary | ICD-10-CM | POA: Insufficient documentation

## 2013-12-11 DIAGNOSIS — J449 Chronic obstructive pulmonary disease, unspecified: Secondary | ICD-10-CM | POA: Insufficient documentation

## 2013-12-11 DIAGNOSIS — R509 Fever, unspecified: Secondary | ICD-10-CM | POA: Insufficient documentation

## 2013-12-11 DIAGNOSIS — R059 Cough, unspecified: Secondary | ICD-10-CM | POA: Insufficient documentation

## 2013-12-11 DIAGNOSIS — J208 Acute bronchitis due to other specified organisms: Secondary | ICD-10-CM

## 2013-12-11 DIAGNOSIS — R05 Cough: Secondary | ICD-10-CM | POA: Insufficient documentation

## 2013-12-11 DIAGNOSIS — J0120 Acute ethmoidal sinusitis, unspecified: Secondary | ICD-10-CM

## 2013-12-11 MED ORDER — LEVOFLOXACIN 500 MG PO TABS: 500.0000 mg | ORAL_TABLET | Freq: Every day | ORAL | Status: DC

## 2013-12-11 NOTE — Telephone Encounter (Signed)
Patient states that she was seen at [facility] in [place].  She was treated with ABT and completed this.  She is still complaining of cough with minimal green sputum.  Would like to know if she can have rx sent to [facility] in [place].  Primarily for cough with congestion.

## 2013-12-11 NOTE — Telephone Encounter (Signed)
Give appt for today at 2:15 today pls

## 2013-12-11 NOTE — Patient Instructions (Addendum)
F/u in 3 month, blood pressure elevated today, call if you need me before  CBC and diff today and CXR today  You are treated for sinusitis and acute bronchitis, continue OtC meds you are using for cough in the day and at night as before  Levaquin one daily  prescribed for 2 weeks for your current infection, this is an antibiotic with different coverage, if you continue to have symptoms pls call back , you will need a scan of your sinuses

## 2013-12-11 NOTE — Progress Notes (Signed)
   Subjective:    Patient ID: Rebecca Gutierrez, female    DOB: Jan 18, 1941, 73 y.o.   MRN: [id number]  HPI 10 day h/o ethmoid sinus pressure, green nasal drainage cough and fever despite doxycycline  For 10 days, feels ill, fatigue and poor appetite , using oTC meds also but symptoms seem to be worsening   Review of Systems See HPI . Denies chest pains, palpitations and leg swelling Denies abdominal pain, nausea, vomiting,diarrhea or constipation.   Denies dysuria, frequency, hesitancy or incontinence. Denies joint pain, swelling and limitation in mobility. Denies headaches, seizures, numbness, or tingling. Denies depression, anxiety or insomnia. Denies skin break down or rash.        Objective:   Physical Exam BP 142/80 mmHg  Pulse 100  Temp(Src) 100.1 F (37.8 C)  Resp 18  Ht 5\' 2"  (1.575 m)  Wt 129 lb 0.6 oz (58.532 kg)  BMI 23.60 kg/m2  SpO2 95% Patient alert and oriented and in no cardiopulmonary distress.IUll appearing  HEENT: No facial asymmetry, EOMI,   oropharynx pink and moist.  Neck supple no JVD, no mass. Bilateral anterior cervical adenopathy, ethmoid sinus tenderness, TM clear with good ligth reflex Chest: decreased though adequate iar entry, bibasilar crackles, no wheezes  CVS: S1, S2 no murmurs, no S3.Regular rate.  ABD: Soft non tender.   Ext: No edema  MS: Adequate ROM spine, shoulders, hips and knees.  Skin: Intact, no ulcerations or rash noted.  Psych: Good eye contact, normal affect. Memory intact not anxious or depressed appearing.  CNS: CN 2-12 intact, power,  normal throughout.no focal deficits noted.        Assessment & Plan:  Sinusitis, acute ethmoidal levaquin prescribed, pt to continue decongestant she already has , increased water intake and rest  Acute bronchitis levaquin , decongestant and CXr on day of visit to eval for pneumonia  ESSENTIAL HYPERTENSION, BENIGN Elevated, likely due excessive use of decongestants

## 2013-12-12 ENCOUNTER — Other Ambulatory Visit: Payer: Self-pay

## 2013-12-12 DIAGNOSIS — I10 Essential (primary) hypertension: Secondary | ICD-10-CM

## 2013-12-12 LAB — CBC WITH DIFFERENTIAL/PLATELET
Basophils Absolute: 0 10*3/uL (ref 0.0–0.1)
Basophils Relative: 0 % (ref 0–1)
EOS PCT: 0 % (ref 0–5)
Eosinophils Absolute: 0 10*3/uL (ref 0.0–0.7)
HCT: 37.7 % (ref 36.0–46.0)
Hemoglobin: 13.2 g/dL (ref 12.0–15.0)
LYMPHS PCT: 10 % — AB (ref 12–46)
Lymphs Abs: 1.4 10*3/uL (ref 0.7–4.0)
MCH: 30.3 pg (ref 26.0–34.0)
MCHC: 35 g/dL (ref 30.0–36.0)
MCV: 86.7 fL (ref 78.0–100.0)
Monocytes Absolute: 0.7 10*3/uL (ref 0.1–1.0)
Monocytes Relative: 5 % (ref 3–12)
NEUTROS PCT: 85 % — AB (ref 43–77)
Neutro Abs: 11.5 10*3/uL — ABNORMAL HIGH (ref 1.7–7.7)
Platelets: 378 10*3/uL (ref 150–400)
RBC: 4.35 MIL/uL (ref 3.87–5.11)
RDW: 13.9 % (ref 11.5–15.5)
WBC: 13.5 10*3/uL — ABNORMAL HIGH (ref 4.0–10.5)

## 2013-12-21 LAB — CBC WITH DIFFERENTIAL/PLATELET
BASOS PCT: 0 % (ref 0–1)
Basophils Absolute: 0 10*3/uL (ref 0.0–0.1)
Eosinophils Absolute: 0.1 10*3/uL (ref 0.0–0.7)
Eosinophils Relative: 2 % (ref 0–5)
HEMATOCRIT: 33.5 % — AB (ref 36.0–46.0)
Hemoglobin: 11.8 g/dL — ABNORMAL LOW (ref 12.0–15.0)
Lymphocytes Relative: 26 % (ref 12–46)
Lymphs Abs: 0.8 10*3/uL (ref 0.7–4.0)
MCH: 30.3 pg (ref 26.0–34.0)
MCHC: 35.2 g/dL (ref 30.0–36.0)
MCV: 85.9 fL (ref 78.0–100.0)
MONO ABS: 0.3 10*3/uL (ref 0.1–1.0)
Monocytes Relative: 10 % (ref 3–12)
Neutro Abs: 1.9 10*3/uL (ref 1.7–7.7)
Neutrophils Relative %: 62 % (ref 43–77)
Platelets: 278 10*3/uL (ref 150–400)
RBC: 3.9 MIL/uL (ref 3.87–5.11)
RDW: 14.4 % (ref 11.5–15.5)
WBC: 3.1 10*3/uL — ABNORMAL LOW (ref 4.0–10.5)

## 2014-02-26 ENCOUNTER — Other Ambulatory Visit: Payer: Self-pay | Admitting: Family Medicine

## 2014-03-14 ENCOUNTER — Encounter: Payer: Self-pay | Admitting: Family Medicine

## 2014-03-14 ENCOUNTER — Ambulatory Visit (INDEPENDENT_AMBULATORY_CARE_PROVIDER_SITE_OTHER): Payer: MEDICARE | Admitting: Family Medicine

## 2014-03-14 VITALS — BP 158/80 | HR 77 | Resp 16 | Ht 62.0 in | Wt 133.0 lb

## 2014-03-14 DIAGNOSIS — I10 Essential (primary) hypertension: Secondary | ICD-10-CM

## 2014-03-14 DIAGNOSIS — L089 Local infection of the skin and subcutaneous tissue, unspecified: Secondary | ICD-10-CM

## 2014-03-14 DIAGNOSIS — Z23 Encounter for immunization: Secondary | ICD-10-CM

## 2014-03-14 DIAGNOSIS — E785 Hyperlipidemia, unspecified: Secondary | ICD-10-CM

## 2014-03-14 NOTE — Assessment & Plan Note (Addendum)
Ingrown right great toe since 07/2013, had partial removal of right toenail on 02/20/2014 , still on antibiotic keflex for ongoing non healing skin, and tenderness in one area. Still has open skin on right great toe, needs TDaP, has f/u  [facility] 03/27/2014

## 2014-03-14 NOTE — Patient Instructions (Addendum)
Annual physical exam March 20 or after  Blood pressure is high , the upper number is over 140, the lower number is good.  PLEASE start taking the metoprolol 25 mg tablet TWICE daily, 9am and 9 pm  Flu vaccine today and TdaP due to open lesion of right great toenail bed   Fasting labs as ordered

## 2014-03-17 DIAGNOSIS — Z23 Encounter for immunization: Secondary | ICD-10-CM | POA: Insufficient documentation

## 2014-03-17 NOTE — Assessment & Plan Note (Signed)
Uncontrolled, [pt to increase metoprolol dose DASH diet and commitment to daily physical activity for a minimum of 30 minutes discussed and encouraged, as a part of hypertension management. The importance of attaining a healthy weight is also discussed.

## 2014-03-17 NOTE — Assessment & Plan Note (Signed)
Open skin lesion on right great toe Vaccine administered at visit.

## 2014-03-17 NOTE — Progress Notes (Signed)
   Subjective:    Patient ID: Rebecca Gutierrez, female    DOB: March 16, 1941, 73 y.o.   MRN: [id number]  HPI The PT is here for follow up and re-evaluation of chronic medical conditions, specifically uncontrolled hypertension, medication management and review of any available recent lab and radiology data.  Preventive health is updated, specifically  Cancer screening and Immunization.   Has had problems with infected right great ingrown toenail over the past approx 6 months, still has pain, open skin with tenderness and drainage from the area despite management by podiatry, actually is  The PT denies any adverse reactions to current medications since the last visit.        Review of Systems See HPI Denies recent fever or chills. Denies sinus pressure, nasal congestion, ear pain or sore throat. Denies chest congestion, productive cough or wheezing. Denies chest pains, palpitations and leg swelling Denies abdominal pain, nausea, vomiting,diarrhea or constipation.   Denies dysuria, frequency, hesitancy or incontinence. Denies joint pain, swelling and limitation in mobility. Denies headaches, seizures, numbness, or tingling. Denies depression, anxiety or insomnia. .         Objective:   Physical Exam BP 158/80  Pulse 77  Resp 16  Ht 5\' 2"  (1.575 m)  Wt 133 lb (60.328 kg)  BMI 24.32 kg/m2  SpO2 98% Patient alert and oriented and in no cardiopulmonary distress.  HEENT: No facial asymmetry, EOMI,   oropharynx pink and moist.  Neck supple no JVD, no mass.  Chest: Clear to auscultation bilaterally.  CVS: S1, S2 no murmurs, no S3.Regular rate.  ABD: Soft non tender.   Ext: No edema  MS: Adequate ROM spine, shoulders, hips and knees.  Skin:breakdown and open skin on medical aspect of right great toe nail bed, tender, scant drainage seen.  Psych: Good eye contact, normal affect. Memory intact not anxious or depressed appearing.  CNS: CN 2-12 intact, power,  normal  throughout.no focal deficits noted.decreased  hearing , depends on aids        Assessment & Plan:  Toe infection Ingrown right great toe since 07/2013, had partial removal of right toenail on 02/20/2014 , still on antibiotic keflex for ongoing non healing skin, and tenderness in one area. Still has open skin on right great toe, needs TDaP, has f/u  [facility] pod[facility] 03/27/2014  ESSENTIAL HYPERTENSION, BENIGN Uncontrolled, [pt to increase metoprolol dose DASH diet and commitment to daily physical activity for a minimum of 30 minutes discussed and encouraged, as a part of hypertension management. The importance of attaining a healthy weight is also discussed.   Need for Tdap vaccination Open skin lesion on right great toe Vaccine administered at visit.   Hyperlipidemia LDL goal <100 Hyperlipidemia:Low fat diet discussed and encouraged.  Updated lab needed at/ before next visit.   Need for prophylactic vaccination and inoculation against influenza Vaccine administered at visit.

## 2014-03-17 NOTE — Assessment & Plan Note (Signed)
Vaccine administered at visit.  

## 2014-03-17 NOTE — Assessment & Plan Note (Signed)
Hyperlipidemia:Low fat diet discussed and encouraged.  Updated lab needed at/ before next visit.  

## 2014-03-22 ENCOUNTER — Other Ambulatory Visit: Payer: Self-pay | Admitting: Family Medicine

## 2014-03-22 LAB — COMPREHENSIVE METABOLIC PANEL
ALBUMIN: 4.1 g/dL (ref 3.5–5.2)
ALT: 21 U/L (ref 0–35)
AST: 21 U/L (ref 0–37)
Alkaline Phosphatase: 52 U/L (ref 39–117)
BUN: 20 mg/dL (ref 6–23)
CHLORIDE: 104 meq/L (ref 96–112)
CO2: 28 mEq/L (ref 19–32)
Calcium: 9 mg/dL (ref 8.4–10.5)
Creat: 0.75 mg/dL (ref 0.50–1.10)
Glucose, Bld: 87 mg/dL (ref 70–99)
POTASSIUM: 3.5 meq/L (ref 3.5–5.3)
Sodium: 139 mEq/L (ref 135–145)
TOTAL PROTEIN: 6.3 g/dL (ref 6.0–8.3)
Total Bilirubin: 1.7 mg/dL — ABNORMAL HIGH (ref 0.2–1.2)

## 2014-03-22 LAB — LIPID PANEL
Cholesterol: 159 mg/dL (ref 0–200)
HDL: 70 mg/dL (ref >39)
LDL CALC: 78 mg/dL (ref 0–99)
Total CHOL/HDL Ratio: 2.3 Ratio
Triglycerides: 55 mg/dL (ref <150)
VLDL: 11 mg/dL (ref 0–40)

## 2014-03-22 LAB — TSH: TSH: 2.432 u[IU]/mL (ref 0.350–4.500)

## 2014-03-23 ENCOUNTER — Encounter: Payer: Self-pay | Admitting: Family Medicine

## 2014-04-10 ENCOUNTER — Other Ambulatory Visit: Payer: Self-pay | Admitting: Family Medicine

## 2014-04-12 ENCOUNTER — Telehealth: Payer: Self-pay

## 2014-04-12 MED ORDER — CIPROFLOXACIN HCL 500 MG PO TABS
500.0000 mg | ORAL_TABLET | Freq: Two times a day (BID) | ORAL | Status: DC
Start: 2014-04-12 — End: 2014-09-17

## 2014-04-12 NOTE — Telephone Encounter (Signed)
Does she have fever or chills or flank pain?? How many days duration of symptoms. If "no " to first question 6 cipro ONLY and 500 mg one twice daily.Pls send in and lether know If she gets worse or does not get better, she WILL NEED TO be evalauted in the office and will need to call in for appt, pls ensure she understands inappropriately treated UTI may cause kidney infection which could put her in the hospital. Better for her to submit urine c/s only, she has h/o  UTI in the past, order if she agres pls

## 2014-04-12 NOTE — Telephone Encounter (Signed)
Cipro sent to requested pharmacy.  Patient aware and states that if she continues to have symptoms she will come in for urine eval.

## 2014-04-12 NOTE — Addendum Note (Signed)
Addended by: Denman George B on: 04/12/2014 11:34 AM   Modules accepted: Orders

## 2014-04-12 NOTE — Telephone Encounter (Signed)
noted 

## 2014-04-12 NOTE — Telephone Encounter (Signed)
No flank pain or fever.  Symptoms x 2 days.   Patient states that she is unable to come to leave sample due to location.

## 2014-04-14 ENCOUNTER — Other Ambulatory Visit: Payer: Self-pay | Admitting: Family Medicine

## 2014-05-04 ENCOUNTER — Other Ambulatory Visit: Payer: Self-pay | Admitting: Family Medicine

## 2014-05-31 ENCOUNTER — Telehealth: Payer: Self-pay | Admitting: *Deleted

## 2014-05-31 NOTE — Telephone Encounter (Signed)
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 Please advise [phone]

## 2014-06-01 NOTE — Telephone Encounter (Signed)
Patient states that she was previously treated by a chiropractor for back problems and previous MRI was ordered by that office. She has an appointment with the chiropractor on next week.  She states that she is going to start there.

## 2014-06-01 NOTE — Telephone Encounter (Signed)
What symptoms specifically does she have ,pls document, has she had previous imaging studies to verify this ruptd disc and where, need access I do recall shas back probs, but mayneed OV for coverage  Of MRI if this type of info cannot be obtained

## 2014-06-01 NOTE — Telephone Encounter (Signed)
Do you suggest x rays 1st?

## 2014-06-03 NOTE — Telephone Encounter (Signed)
Noted, thanks!

## 2014-06-16 ENCOUNTER — Other Ambulatory Visit: Payer: Self-pay | Admitting: Family Medicine

## 2014-06-19 ENCOUNTER — Telehealth: Payer: Self-pay | Admitting: Family Medicine

## 2014-06-19 ENCOUNTER — Ambulatory Visit: Payer: Self-pay

## 2014-06-19 ENCOUNTER — Ambulatory Visit (INDEPENDENT_AMBULATORY_CARE_PROVIDER_SITE_OTHER): Payer: Self-pay

## 2014-06-19 VITALS — Wt 133.0 lb

## 2014-06-19 DIAGNOSIS — N3001 Acute cystitis with hematuria: Secondary | ICD-10-CM

## 2014-06-19 LAB — POCT URINALYSIS DIPSTICK
Bilirubin, UA: NEGATIVE
Glucose, UA: NEGATIVE
Ketones, UA: NEGATIVE
Nitrite, UA: NEGATIVE
Protein, UA: NEGATIVE
Spec Grav, UA: 1.01
Urobilinogen, UA: 0.2
pH, UA: 6.5

## 2014-06-19 MED ORDER — CIPROFLOXACIN HCL 500 MG PO TABS: 500.0000 mg | ORAL_TABLET | Freq: Two times a day (BID) | ORAL | Status: DC

## 2014-06-19 NOTE — Progress Notes (Signed)
Dysuria x 3 days with frequency and cloudy urine and lower abdominal pressure. UA positive for infection. Will send for culture and send per protocol cipro to her pharmacy

## 2014-06-19 NOTE — Telephone Encounter (Signed)
Will come in this pm for NV for UA

## 2014-06-23 LAB — URINE CULTURE: Colony Count: >=100000

## 2014-06-24 DIAGNOSIS — J209 Acute bronchitis, unspecified: Secondary | ICD-10-CM | POA: Insufficient documentation

## 2014-06-24 DIAGNOSIS — J0120 Acute ethmoidal sinusitis, unspecified: Secondary | ICD-10-CM | POA: Insufficient documentation

## 2014-06-24 NOTE — Assessment & Plan Note (Signed)
Elevated, likely due excessive use of decongestants

## 2014-06-24 NOTE — Assessment & Plan Note (Signed)
levaquin prescribed, pt to continue decongestant she already has , increased water intake and rest

## 2014-06-24 NOTE — Assessment & Plan Note (Signed)
levaquin , decongestant and CXr on day of visit to eval for pneumonia

## 2014-09-05 ENCOUNTER — Telehealth: Payer: Self-pay

## 2014-09-05 ENCOUNTER — Other Ambulatory Visit: Payer: Self-pay | Admitting: Family Medicine

## 2014-09-05 DIAGNOSIS — E785 Hyperlipidemia, unspecified: Secondary | ICD-10-CM

## 2014-09-05 DIAGNOSIS — I10 Essential (primary) hypertension: Secondary | ICD-10-CM

## 2014-09-05 DIAGNOSIS — Z1231 Encounter for screening mammogram for malignant neoplasm of breast: Secondary | ICD-10-CM

## 2014-09-05 NOTE — Telephone Encounter (Signed)
She needs fasting lipid and  cmp dx is HTN and hyoperlipidemia

## 2014-09-06 NOTE — Addendum Note (Signed)
Addended by: Denman George B on: 09/06/2014 09:56 AM   Modules accepted: Orders

## 2014-09-06 NOTE — Telephone Encounter (Signed)
Patient aware and orders faxed to [facility]

## 2014-09-13 LAB — LIPID PANEL
Cholesterol: 170 mg/dL (ref 0–200)
HDL: 67 mg/dL (ref >=46)
LDL CALC: 88 mg/dL (ref 0–99)
TRIGLYCERIDES: 73 mg/dL (ref <150)
Total CHOL/HDL Ratio: 2.5 Ratio
VLDL: 15 mg/dL (ref 0–40)

## 2014-09-13 LAB — COMPREHENSIVE METABOLIC PANEL
ALT: 19 U/L (ref 0–35)
AST: 21 U/L (ref 0–37)
Albumin: 4.1 g/dL (ref 3.5–5.2)
Alkaline Phosphatase: 55 U/L (ref 39–117)
BUN: 23 mg/dL (ref 6–23)
CALCIUM: 9.1 mg/dL (ref 8.4–10.5)
CHLORIDE: 104 meq/L (ref 96–112)
CO2: 28 mEq/L (ref 19–32)
Creat: 0.72 mg/dL (ref 0.50–1.10)
Glucose, Bld: 88 mg/dL (ref 70–99)
Potassium: 3.8 mEq/L (ref 3.5–5.3)
SODIUM: 140 meq/L (ref 135–145)
Total Bilirubin: 1.6 mg/dL — ABNORMAL HIGH (ref 0.2–1.2)
Total Protein: 6.2 g/dL (ref 6.0–8.3)

## 2014-09-14 ENCOUNTER — Other Ambulatory Visit: Payer: Self-pay | Admitting: Family Medicine

## 2014-09-17 ENCOUNTER — Ambulatory Visit (INDEPENDENT_AMBULATORY_CARE_PROVIDER_SITE_OTHER): Payer: Medicare Other | Admitting: Family Medicine

## 2014-09-17 ENCOUNTER — Encounter: Payer: Self-pay | Admitting: Family Medicine

## 2014-09-17 VITALS — BP 140/78 | HR 56 | Resp 16 | Ht 62.0 in | Wt 134.0 lb

## 2014-09-17 DIAGNOSIS — N765 Ulceration of vagina: Secondary | ICD-10-CM

## 2014-09-17 DIAGNOSIS — I10 Essential (primary) hypertension: Secondary | ICD-10-CM

## 2014-09-17 DIAGNOSIS — Z1211 Encounter for screening for malignant neoplasm of colon: Secondary | ICD-10-CM

## 2014-09-17 DIAGNOSIS — E559 Vitamin D deficiency, unspecified: Secondary | ICD-10-CM

## 2014-09-17 DIAGNOSIS — Z0000 Encounter for general adult medical examination without abnormal findings: Secondary | ICD-10-CM | POA: Diagnosis not present

## 2014-09-17 DIAGNOSIS — Z23 Encounter for immunization: Secondary | ICD-10-CM

## 2014-09-17 DIAGNOSIS — E785 Hyperlipidemia, unspecified: Secondary | ICD-10-CM

## 2014-09-17 NOTE — Assessment & Plan Note (Signed)

## 2014-09-17 NOTE — Patient Instructions (Addendum)
Annual wellness  Oct 10 or after, call if you need me before  Labs are excellent,no med changes  Continue healthy lifestyle, and all the best with your move  Prevnar today  Bones are thin, you need to commit to 1200 mg calcium and 800IU vit D every day, intake from regular food is juist as good as  Taking it in  Tablet form Commiting to daily weuight bearing physical activity , like walking, or gardening for 30 minutes will help bones, also you may carry 2 pound weight s in your hands to help even more, if able   You are referred to Dr Glo Herring, as we discussed  CBC, fasting lipid, cmp and EGFr, TSH and Vit D Oct 10 or after   Thanks for choosing [facility], we consider it a privelige to serve you.

## 2014-09-17 NOTE — Assessment & Plan Note (Signed)
After obtaining informed consent, the vaccine is  administered by LPN.  

## 2014-09-17 NOTE — Assessment & Plan Note (Signed)
Over past approx 6 months, pt has noted ulcer in introitus, applies vaseline, 2 occurences, mostt recent in the past 1 to 2 weeks, still present on exam today, gyne to eval

## 2014-09-17 NOTE — Assessment & Plan Note (Signed)
No rectal mass, heme negative stool 

## 2014-09-18 LAB — POC HEMOCCULT BLD/STL (OFFICE/1-CARD/DIAGNOSTIC): FECAL OCCULT BLD: NEGATIVE

## 2014-09-19 ENCOUNTER — Ambulatory Visit (HOSPITAL_COMMUNITY)
Admission: RE | Admit: 2014-09-19 | Discharge: 2014-09-19 | Disposition: A | Discharge status: Home / Self Care | Payer: Medicare Other | Source: Ambulatory Visit | Attending: Family Medicine | Admitting: Family Medicine

## 2014-09-19 DIAGNOSIS — Z1231 Encounter for screening mammogram for malignant neoplasm of breast: Secondary | ICD-10-CM

## 2014-09-20 ENCOUNTER — Other Ambulatory Visit: Payer: Self-pay | Admitting: Family Medicine

## 2014-09-23 NOTE — Progress Notes (Signed)
   Subjective:    Patient ID: Rebecca Gutierrez, female    DOB: [birth year], 74 y.o.   MRN: [id number]  HPI Patient is in for annual physical exam. C/O recurrent ulcer at introitus on 2 occasions in the past approximately 4 months. Uses vaseline topically. Not currently sexually active Recent labs, if  are reviewed and are excellent Immunization is reviewed , prevnar needed , and administered    Review of Systems See HPI     Objective:   Physical Exam  BP 140/78 mmHg  Pulse 56  Resp 16  Ht 5\' 2"  (1.575 m)  Wt 134 lb (60.782 kg)  BMI 24.50 kg/m2  SpO2 97% Pleasant well nourished female, alert and oriented x 3, in no cardio-pulmonary distress. Afebrile. HEENT No facial trauma or asymetry. Sinuses non tender.  Extra occullar muscles intact, pupils equally reactive to light. External ears normal, tympanic membranes clear. Oropharynx moist, no exudate, good dentition. Neck: supple, no adenopathy,JVD or thyromegaly.No bruits.  Chest: Clear to ascultation bilaterally.No crackles or wheezes. Non tender to palpation  Breast: No asymetry,no masses or lumps. No tenderness. No nipple discharge or inversion. No axillary or supraclavicular adenopathy  Cardiovascular system; Heart sounds normal,  S1 and  S2 ,no S3.  No murmur, or thrill. Apical beat not displaced Peripheral pulses normal.  Abdomen: Soft, non tender, no organomegaly or masses. No bruits. Bowel sounds normal. No guarding, tenderness or rebound.  Rectal:  Normal sphincter tone. No mass.No rectal masses.  Guaiac negative stool.  GU: External genitalia normal female genitalia , female distribution of hair. No lesions. Urethral meatus normal in size, no  Prolapse, no lesions visibly  Present. 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   Musculoskeletal exam: Full ROM of spine, hips , shoulders and knees. No deformity ,swelling or crepitus noted. No muscle wasting or atrophy.   Neurologic: Cranial nerves 2 to 12 intact.Relies on hearing aides Power, tone ,sensation and reflexes normal throughout. No disturbance in gait. No tremor.  Skin: Intact, no ulceration, erythema , scaling or rash noted. Pigmentation normal throughout  Psych; Normal mood and affect. Judgement and concentration normal       Assessment & Plan:  Annual physical exam Annual exam as documented. Counseling done  re healthy lifestyle involving commitment to 150 minutes exercise per week, heart healthy diet, and attaining healthy weight.The importance of adequate sleep also discussed. Regular seat belt use and home safety, is also discussed. Changes in health habits are decided on by the patient with goals and time frames  set for achieving them. Immunization and cancer screening needs are specifically addressed at this visit.    Need for vaccination with 13-polyvalent pneumococcal conjugate vaccine After obtaining informed consent, the vaccine is  administered by LPN.     Special screening for malignant neoplasms, colon No rectal mass, heme negative stool   Vaginal ulceration Over past approx 6 months, pt has noted ulcer in introitus, applies vaseline, 2 occurences, mostt recent in the past 1 to 2 weeks, still present on exam today, gyne to eval

## 2014-10-04 ENCOUNTER — Telehealth: Payer: Self-pay | Admitting: *Deleted

## 2014-10-04 NOTE — Telephone Encounter (Signed)
Pt called stating she has had a UTI for a week and she would like a Cippro called in. Please advise

## 2014-10-05 ENCOUNTER — Ambulatory Visit (INDEPENDENT_AMBULATORY_CARE_PROVIDER_SITE_OTHER): Payer: Medicare Other

## 2014-10-05 DIAGNOSIS — N3001 Acute cystitis with hematuria: Secondary | ICD-10-CM | POA: Diagnosis not present

## 2014-10-05 DIAGNOSIS — N390 Urinary tract infection, site not specified: Secondary | ICD-10-CM

## 2014-10-05 LAB — POCT URINALYSIS DIPSTICK
BILIRUBIN UA: NEGATIVE
Glucose, UA: NEGATIVE
KETONES UA: NEGATIVE
Nitrite, UA: NEGATIVE
PH UA: 6.5
Protein, UA: NEGATIVE
Spec Grav, UA: 1.02
UROBILINOGEN UA: 0.2

## 2014-10-05 MED ORDER — CIPROFLOXACIN HCL 500 MG PO TABS: 500.0000 mg | ORAL_TABLET | Freq: Two times a day (BID) | ORAL | Status: DC

## 2014-10-05 NOTE — Progress Notes (Signed)
Patient c/o burning and pressure with urination.  She has had a bladder prolapse in the past.  She will call gyne to followup with this as she may be having problems again.

## 2014-10-05 NOTE — Telephone Encounter (Signed)
Coming for nurse visit this am

## 2014-10-11 LAB — URINE CULTURE: Colony Count: >=100000

## 2014-10-12 ENCOUNTER — Other Ambulatory Visit: Payer: Self-pay | Admitting: Family Medicine

## 2014-10-12 ENCOUNTER — Other Ambulatory Visit: Payer: Self-pay

## 2014-10-12 MED ORDER — AMPICILLIN 500 MG PO CAPS: 500.0000 mg | ORAL_CAPSULE | Freq: Four times a day (QID) | ORAL | Status: DC

## 2014-10-12 NOTE — Addendum Note (Signed)
Addended by: Eual Fines on: 10/12/2014 09:11 AM   Modules accepted: Orders

## 2014-10-31 ENCOUNTER — Other Ambulatory Visit: Payer: Self-pay | Admitting: Family Medicine

## 2014-12-11 ENCOUNTER — Encounter: Payer: Self-pay | Admitting: Family Medicine

## 2014-12-11 ENCOUNTER — Ambulatory Visit (INDEPENDENT_AMBULATORY_CARE_PROVIDER_SITE_OTHER): Payer: Medicare Other | Admitting: Family Medicine

## 2014-12-11 VITALS — BP 132/68 | HR 68 | Resp 18 | Ht 62.0 in | Wt 134.1 lb

## 2014-12-11 DIAGNOSIS — S0990XA Unspecified injury of head, initial encounter: Secondary | ICD-10-CM | POA: Diagnosis not present

## 2014-12-11 DIAGNOSIS — G8929 Other chronic pain: Secondary | ICD-10-CM | POA: Insufficient documentation

## 2014-12-11 DIAGNOSIS — N390 Urinary tract infection, site not specified: Secondary | ICD-10-CM

## 2014-12-11 DIAGNOSIS — N765 Ulceration of vagina: Secondary | ICD-10-CM

## 2014-12-11 DIAGNOSIS — R102 Pelvic and perineal pain: Secondary | ICD-10-CM

## 2014-12-11 DIAGNOSIS — N949 Unspecified condition associated with female genital organs and menstrual cycle: Secondary | ICD-10-CM | POA: Diagnosis not present

## 2014-12-11 DIAGNOSIS — I10 Essential (primary) hypertension: Secondary | ICD-10-CM

## 2014-12-11 DIAGNOSIS — Z01419 Encounter for gynecological examination (general) (routine) without abnormal findings: Secondary | ICD-10-CM | POA: Diagnosis not present

## 2014-12-11 LAB — POCT URINALYSIS DIPSTICK
BILIRUBIN UA: NEGATIVE
Blood, UA: NEGATIVE
GLUCOSE UA: NEGATIVE
Ketones, UA: NEGATIVE
Leukocytes, UA: NEGATIVE
Nitrite, UA: NEGATIVE
Protein, UA: NEGATIVE
SPEC GRAV UA: 1.025
Urobilinogen, UA: 0.2
pH, UA: 6

## 2014-12-11 NOTE — Progress Notes (Signed)
   Subjective:    Patient ID: Rebecca Gutierrez, female    DOB: [birth year], 74 y.o.   MRN: [id number]  HPI C/o trauma to left side of head approx 1 month ago, since that time intermittent episodes as if she has had a drink, localized to the area of trauma Intermittent left pelvic pain  Persists, will refer to gyne  Tick removed from abdomen last night, wants area checked, none remains Most recent UTI was approx 1 month ago, treated fioor 10 days, no more symptoms Review of Systems See HPI Denies recent fever or chills. Denies sinus pressure, nasal congestion, ear pain or sore throat. Denies chest congestion, productive cough or wheezing. Denies chest pains, palpitations and leg swelling Denies  nausea, vomiting,diarrhea or constipation.   Denies  frequency, hesitancy or incontinence.c/o mild flank pain and frequency, recently treated for UTI Denies joint pain, swelling and limitation in mobility.  Denies depression, anxiety or insomnia.       Objective:   Physical Exam BP 132/68 mmHg  Pulse 68  Resp 18  Ht 5\' 2"  (1.575 m)  Wt 134 lb 1.3 oz (60.818 kg)  BMI 24.52 kg/m2  SpO2 97% Patient alert and oriented and in no cardiopulmonary distress.  HEENT: No facial asymmetry, EOMI,   oropharynx pink and moist.  Neck supple no JVD, no mass.  Chest: Clear to auscultation bilaterally.  CVS: S1, S2 no murmurs, no S3.Regular rate.  ABD: Soft mild left lower quadrant tenderness, no guarding or rebound, no mass or organomegaly   Ext: No edema  MS: Adequate ROM spine, shoulders, hips and knees.  Skin: Intact, no ulcerations or rash noted.Puncture wound on abdomen where tick completely removed  Psych: Good eye contact, normal affect. Memory intact not anxious or depressed appearing.  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 The importance of attaining a healthy weight is also discussed.  BP/Weight 12/21/2014 12/11/2014 09/17/2014 06/19/2014 03/14/2014 12/11/2013 9/84/2103  Systolic BP 128 118 867 - 737 366 815  Diastolic BP 60 68 78 - 80 80 72  Wt. (Lbs) 133 134.08 134 133 133 129.04 130.08  BMI 24.32 24.52 24.5 24.32 24.32 23.6 23.79        Recurrent UTI Recently treated for UTI with 10 day antibiotic course, requests urine check as has problem with recurrent symptoms UA in office is negative for infection pt is reassured

## 2014-12-11 NOTE — Assessment & Plan Note (Addendum)
Trauma approx 4 weeks ago to left side of crown, no swelling,no LOC , no drainage from ears or nose , daily drunk feeling , no pain, no memory loss, imaging study needed to further assess Non focal neuro exam at visit

## 2014-12-11 NOTE — Patient Instructions (Signed)
F/u as before , call if you need me sooner You are referred for scan of head  You are referred to gynecologist Dr Glo Herring  No retained tick in abdomen

## 2014-12-14 ENCOUNTER — Other Ambulatory Visit: Payer: Self-pay | Admitting: Family Medicine

## 2014-12-14 ENCOUNTER — Ambulatory Visit (HOSPITAL_COMMUNITY)
Admission: RE | Admit: 2014-12-14 | Discharge: 2014-12-14 | Disposition: A | Discharge status: Home / Self Care | Payer: Medicare Other | Source: Ambulatory Visit | Attending: Family Medicine | Admitting: Family Medicine

## 2014-12-14 DIAGNOSIS — R4182 Altered mental status, unspecified: Secondary | ICD-10-CM | POA: Diagnosis not present

## 2014-12-14 DIAGNOSIS — X58XXXA Exposure to other specified factors, initial encounter: Secondary | ICD-10-CM | POA: Insufficient documentation

## 2014-12-14 DIAGNOSIS — G319 Degenerative disease of nervous system, unspecified: Secondary | ICD-10-CM | POA: Insufficient documentation

## 2014-12-14 DIAGNOSIS — S0990XA Unspecified injury of head, initial encounter: Secondary | ICD-10-CM

## 2014-12-14 DIAGNOSIS — R42 Dizziness and giddiness: Secondary | ICD-10-CM | POA: Insufficient documentation

## 2014-12-21 ENCOUNTER — Ambulatory Visit (INDEPENDENT_AMBULATORY_CARE_PROVIDER_SITE_OTHER): Payer: Medicare Other | Admitting: Obstetrics and Gynecology

## 2014-12-21 ENCOUNTER — Encounter: Payer: Self-pay | Admitting: Obstetrics and Gynecology

## 2014-12-21 VITALS — BP 110/60 | Ht 62.0 in | Wt 133.0 lb

## 2014-12-21 DIAGNOSIS — IMO0002 Reserved for concepts with insufficient information to code with codable children: Secondary | ICD-10-CM

## 2014-12-21 DIAGNOSIS — N329 Bladder disorder, unspecified: Secondary | ICD-10-CM

## 2014-12-21 DIAGNOSIS — N8110 Cystocele, unspecified: Secondary | ICD-10-CM | POA: Diagnosis not present

## 2014-12-21 NOTE — Progress Notes (Signed)
Patient ID: TERRIE HARING, female   DOB: September 25, 1940, 74 y.o.   MRN: [id number]    [facility] Visit  Patient name: Rebecca Gutierrez MRN [id number]  Date of birth: [birth year]  CC & HPI:  Rebecca Gutierrez is a 74 y.o. female s/p tubal ligation, prolapsed uterus and vaginal hysterectomy presenting today for intermittent, moderate left lower quadrant abdominal pain just medial to her left hip that started a few weeks ago. She states frequent urination and nocturia as associated symptoms. Pt has a history of cysts on her left ovaries. She is currently being followed by urology for a prolapsed bladder and frequent bladder infections.   ROS:  A complete 10 system review of systems was obtained and all systems are negative except as noted in the HPI and PMH.   Pertinent History Reviewed:   Reviewed: Significant for tubal ligation and vaginal hysterectomy Medical         Past Medical History  Diagnosis Date  . Hearing loss 1975  . Gilbert's syndrome   . Anxiety   . Hypertension 2000  . Hyperlipidemia 2000                              Surgical Hx:    Past Surgical History  Procedure Laterality Date  . Colonoscopy  08/28/2011    Procedure: COLONOSCOPY;  Surgeon: [name], MD;  Location: [facility];  Service: Endoscopy;  Laterality: N/A;  930  . Vaginal hysterectomy N/A 05/02/2013    Procedure: HYSTERECTOMY VAGINAL;  Surgeon: [name], MD;  Location: [facility]S;  Service: Gynecology;  Laterality: N/A;  . Anterior and posterior repair N/A 05/02/2013    Procedure: ANTERIOR (CYSTOCELE) AND POSTERIOR REPAIR (RECTOCELE);  Surgeon: [name], MD;  Location: [facility];  Service: Gynecology;  Laterality: N/A;  . Eye surgery Bilateral 02/13/2011    other 2013  . Tubal ligation     Medications: Reviewed & Updated - see associated section                       Current outpatient prescriptions:  .  aspirin EC 81 MG tablet, Take 81 mg by mouth at bedtime. , Disp: , Rfl:   .  hydrochlorothiazide (HYDRODIURIL) 25 MG tablet, TAKE 1 TABLET DAILY, Disp: 90 tablet, Rfl: 1 .  metoprolol tartrate (LOPRESSOR) 25 MG tablet, Take 25 mg by mouth 2 (two) times daily.  , Disp: , Rfl:  .  ramipril (ALTACE) 10 MG capsule, TAKE 1 CAPSULE DAILY, Disp: 90 capsule, Rfl: 1 .  simvastatin (ZOCOR) 10 MG tablet, TAKE 1 TABLET DAILY, Disp: 90 tablet, Rfl: 1   Social History: Reviewed -  reports that she has never smoked. She has never used smokeless tobacco.  Objective Findings:  Vitals: Blood pressure 110/60, height 5\' 2"  (1.575 m), weight 133 lb (60.328 kg).  Physical Examination: General appearance - alert, well appearing, and in no distress and oriented to person, place, and time Mental status - alert, oriented to person, place, and time, normal mood, behavior, speech, dress, motor activity, and thought processes Pelvic - normal external genitalia, vulva, vagina  VULVA: normal appearing vulva with no masses, tenderness or lesions  VAGINA: normal appearing vagina with normal color and discharge, no lesions, CERVIX: surgically absent  UTERUS: surgically absent ADNEXA: No cysts or abnormality detected via US BLADDER- 7.8 x 8.2 x 5.8 cm; US shows incomplete  voiding with ~300 cc of urine.Pt had voided upon arrival for visit  Assessment & Plan:   A:  1. Hx of frequent bladder infections 2. Incomplete voiding suspected 3. No ovarian cyst detected on US exam; pain suspected secondary to decreased support 4 cystocele P:  1. Pt to follow-up with urologist next week regarding PVR and cystocele, chronic uti's                                                    This chart was scribed for [name], MD by Tula Nakayama, ED Scribe. This patient was seen in room 1 and the patient's care was started at 12:11 PM.   I personally performed the services described in this documentation, which was SCRIBED in my presence. The recorded information has been reviewed and  considered accurate. It has been edited as necessary during review. [name], MD

## 2014-12-21 NOTE — Progress Notes (Signed)
Patient ID: AILED DEFIBAUGH, female   DOB: 09-22-1940, 74 y.o.   MRN: [id number] Pt here today for follow up. Pt states that Dr. Glo Herring told her that he was unable to get to her ovaries but her PCP told her that she doesn't have any ovaries. Pt is a little confused and wants clarification. Pt states that she has pain at time son her left side close to her pelvic area.

## 2014-12-25 ENCOUNTER — Ambulatory Visit (INDEPENDENT_AMBULATORY_CARE_PROVIDER_SITE_OTHER): Payer: Medicare Other | Admitting: Urology

## 2014-12-25 DIAGNOSIS — N3000 Acute cystitis without hematuria: Secondary | ICD-10-CM | POA: Diagnosis not present

## 2014-12-25 DIAGNOSIS — N8111 Cystocele, midline: Secondary | ICD-10-CM

## 2014-12-25 DIAGNOSIS — N952 Postmenopausal atrophic vaginitis: Secondary | ICD-10-CM | POA: Diagnosis not present

## 2015-02-19 ENCOUNTER — Ambulatory Visit (INDEPENDENT_AMBULATORY_CARE_PROVIDER_SITE_OTHER): Payer: Medicare Other | Admitting: Urology

## 2015-02-19 DIAGNOSIS — N390 Urinary tract infection, site not specified: Secondary | ICD-10-CM

## 2015-03-10 DIAGNOSIS — N390 Urinary tract infection, site not specified: Secondary | ICD-10-CM | POA: Insufficient documentation

## 2015-03-10 HISTORY — DX: Urinary tract infection, site not specified: N39.0

## 2015-03-10 NOTE — Assessment & Plan Note (Signed)
Ulceration in vaginal area, recurrent/ chronic , gyne to assess and manage as necessary

## 2015-03-10 NOTE — Assessment & Plan Note (Signed)
Recently treated for UTI with 10 day antibiotic course, requests urine check as has problem with recurrent symptoms UA in office is negative for infection pt is reassured

## 2015-03-10 NOTE — Assessment & Plan Note (Signed)
Controlled, no change in medication DASH diet and commitment to daily physical activity for a minimum of 30 minutes discussed and encouraged, as a part of hypertension management. The importance of attaining a healthy weight is also discussed.  BP/Weight 12/21/2014 12/11/2014 09/17/2014 06/19/2014 03/14/2014 12/11/2013 8/71/9597  Systolic BP 471 855 015 - 868 257 493  Diastolic BP 60 68 78 - 80 80 72  Wt. (Lbs) 133 134.08 134 133 133 129.04 130.08  BMI 24.32 24.52 24.5 24.32 24.32 23.6 23.79

## 2015-03-10 NOTE — Assessment & Plan Note (Signed)
Chronic left pelvic pain, gyne to re evaluate, this has been a concern voiced on prior occasions and she has seen gyne in the past for this also

## 2015-03-17 ENCOUNTER — Other Ambulatory Visit: Payer: Self-pay | Admitting: Family Medicine

## 2015-03-29 ENCOUNTER — Other Ambulatory Visit: Payer: Self-pay | Admitting: Family Medicine

## 2015-03-29 LAB — COMPLETE METABOLIC PANEL WITH GFR
ALT: 29 U/L (ref 6–29)
AST: 25 U/L (ref 10–35)
Albumin: 4.1 g/dL (ref 3.6–5.1)
Alkaline Phosphatase: 49 U/L (ref 33–130)
BUN: 19 mg/dL (ref 7–25)
CHLORIDE: 101 mmol/L (ref 98–110)
CO2: 29 mmol/L (ref 20–31)
Calcium: 9.3 mg/dL (ref 8.6–10.4)
Creat: 0.71 mg/dL (ref 0.60–0.93)
GFR, EST NON AFRICAN AMERICAN: 85 mL/min (ref >=60)
Glucose, Bld: 86 mg/dL (ref 65–99)
Potassium: 4 mmol/L (ref 3.5–5.3)
Sodium: 141 mmol/L (ref 135–146)
Total Bilirubin: 1.6 mg/dL — ABNORMAL HIGH (ref 0.2–1.2)
Total Protein: 6.3 g/dL (ref 6.1–8.1)

## 2015-03-29 LAB — CBC WITH DIFFERENTIAL/PLATELET
BASOS ABS: 0 10*3/uL (ref 0.0–0.1)
BASOS PCT: 1 % (ref 0–1)
Eosinophils Absolute: 0.1 10*3/uL (ref 0.0–0.7)
Eosinophils Relative: 3 % (ref 0–5)
HCT: 39.3 % (ref 36.0–46.0)
HEMOGLOBIN: 14 g/dL (ref 12.0–15.0)
LYMPHS PCT: 27 % (ref 12–46)
Lymphs Abs: 0.9 10*3/uL (ref 0.7–4.0)
MCH: 31.2 pg (ref 26.0–34.0)
MCHC: 35.6 g/dL (ref 30.0–36.0)
MCV: 87.5 fL (ref 78.0–100.0)
MPV: 9.9 fL (ref 8.6–12.4)
Monocytes Absolute: 0.3 10*3/uL (ref 0.1–1.0)
Monocytes Relative: 10 % (ref 3–12)
NEUTROS ABS: 2 10*3/uL (ref 1.7–7.7)
NEUTROS PCT: 59 % (ref 43–77)
Platelets: 193 10*3/uL (ref 150–400)
RBC: 4.49 MIL/uL (ref 3.87–5.11)
RDW: 13.7 % (ref 11.5–15.5)
WBC: 3.4 10*3/uL — AB (ref 4.0–10.5)

## 2015-03-29 LAB — LIPID PANEL
CHOL/HDL RATIO: 2.3 ratio (ref <=5.0)
CHOLESTEROL: 171 mg/dL (ref 125–200)
HDL: 75 mg/dL (ref >=46)
LDL Cholesterol: 85 mg/dL (ref <130)
Triglycerides: 56 mg/dL (ref <150)
VLDL: 11 mg/dL (ref <30)

## 2015-03-29 LAB — TSH: TSH: 2.389 u[IU]/mL (ref 0.350–4.500)

## 2015-03-30 LAB — VITAMIN D 25 HYDROXY (VIT D DEFICIENCY, FRACTURES): Vit D, 25-Hydroxy: 33 ng/mL (ref 30–100)

## 2015-04-02 ENCOUNTER — Ambulatory Visit (INDEPENDENT_AMBULATORY_CARE_PROVIDER_SITE_OTHER): Payer: Medicare Other | Admitting: Family Medicine

## 2015-04-02 ENCOUNTER — Ambulatory Visit: Payer: Medicare Other | Admitting: Urology

## 2015-04-02 ENCOUNTER — Encounter: Payer: Self-pay | Admitting: Family Medicine

## 2015-04-02 VITALS — BP 130/80 | HR 55 | Resp 16 | Ht 61.5 in | Wt 136.4 lb

## 2015-04-02 DIAGNOSIS — Z0000 Encounter for general adult medical examination without abnormal findings: Secondary | ICD-10-CM | POA: Diagnosis not present

## 2015-04-02 NOTE — Progress Notes (Signed)
Subjective:    Patient ID: Rebecca Gutierrez, female    DOB: 04-10-1941, 74 y.o.   MRN: [id number]  HPI Preventive Screening-Counseling & Management   Patient present here today for a Medicare annual wellness visit.   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 Never smoker    Risk Factors  Current exercise habits: States she will start trying to walk more   Dietary issues discussed: Heart healthy, low carb, low fat    Cardiac risk factors: type 2 diabetes   Depression Screen  (Note: if answer to either of the following is "Yes", a more complete depression screening is indicated)   Over the past two weeks, have you felt down, depressed or hopeless? No  Over the past two weeks, have you felt little interest or pleasure in doing things? No  Have you lost interest or pleasure in daily life? No  Do you often feel hopeless? No  Do you cry easily over simple problems? No   Activities of Daily Living  In your present state of health, do you have any difficulty performing the following activities?  Driving?: No Managing money?: No Feeding yourself?:No Getting from bed to chair?:No Climbing a flight of stairs?:No Preparing food and eating?:No Bathing or showering?:No Getting dressed?:No Getting to the toilet?:No Using the toilet?:No Moving around from place to place?: No  Fall Risk Assessment In the past year have you fallen or had a near fall?:No Are you currently taking any medications that make you dizzy?:No   Hearing Difficulties: No Do you often ask people to speak up or repeat themselves?:yes, wears hearing aids  Do you experience ringing or noises in your ears?:No Do you have difficulty understanding soft or whispered voices?:yes   Cognitive Testing  Alert?  Yes Normal Appearance?Yes  Oriented to person? Yes Place? Yes  Time? Yes  Displays appropriate judgment?Yes  Can read the correct time from a watch face? yes Are you having problems remembering things?No  Advanced Directives have been discussed with the patient?Yes, HPOA  Both children named, primarily her son, [name], and if he is unable her daughter [name]. Full List the Names of Other Physician/Practitioners you currently use:  Dr Teresa Pelton (urology)    Indicate any recent Medical Services you may have received from other than [facility] providers in the past year (date may be approximate).   Assessment:    Annual Wellness Exam   Plan:    During the course of the visit the patient was educated and counseled about appropriate screening and preventive services including:  A healthy diet is rich in fruit, vegetables and whole grains. Poultry fish, nuts and beans are a healthy choice for protein rather then red meat. A low sodium diet and drinking 64 ounces of water daily is generally recommended. Oils and sweet should be limited. Carbohydrates especially for those who are diabetic or overweight, should be limited to 30-45 gram per meal. It is important to eat on a regular schedule, at least 3 times daily. Snacks should be primarily fruits, vegetables or nuts. It is important that you exercise regularly at least 30 minutes 5 times a week. If you develop chest pain, have severe difficulty breathing, or feel very tired, stop exercising immediately and seek medical attention  Immunization reviewed and updated. Cancer screening  reviewed and updated    Patient Instructions (the written plan) was given to the patient.  Medicare Attestation  I have personally reviewed:  The patient's medical and social history  Their use of alcohol, tobacco or illicit drugs  Their current medications and supplements  The patient's functional ability including ADLs,fall risks, home safety risks,  cognitive, and hearing and visual impairment  Diet and physical activities  Evidence for depression or mood disorders  The patient's weight, height, BMI, and visual acuity have been recorded in the chart. I have made referrals, counseling, and provided education to the patient based on review of the above and I have provided the patient with a written personalized care plan for preventive services.      Review of Systems     Objective:   Physical Exam BP 130/80 mmHg  Pulse 55  Resp 16  Ht 5' 1.5" (1.562 m)  Wt 136 lb 6.4 oz (61.871 kg)  BMI 25.36 kg/m2  SpO2 98%        Assessment & Plan:  Medicare annual wellness visit, subsequent Annual exam as documented. Counseling done  re healthy lifestyle involving commitment to 150 minutes exercise per week, heart healthy diet, and attaining healthy weight.The importance of adequate sleep also discussed. Regular seat belt use and home safety, is also discussed. Changes in health habits are decided on by the patient with goals and time frames  set for achieving them. Immunization and cancer screening needs are specifically addressed at this visit.

## 2015-04-02 NOTE — Patient Instructions (Addendum)
Annual physical exam April 6 or after,  too early to order labs for that visit, please call 1 month before visit for lab shty  We will send labs to your heart Doc and get his recent note   Continue to take good care of yourself, and keep active  Thanks for choosing [facility], we consider it a privelige to serve you.   Commit to calcium with D 600mg  to 1200 mg daily as bones are thin please  Discuss living will wih children, info is provided

## 2015-04-02 NOTE — Assessment & Plan Note (Signed)

## 2015-04-27 ENCOUNTER — Other Ambulatory Visit: Payer: Self-pay | Admitting: Family Medicine

## 2015-06-12 ENCOUNTER — Other Ambulatory Visit: Payer: Self-pay | Admitting: Family Medicine

## 2015-06-24 ENCOUNTER — Ambulatory Visit (INDEPENDENT_AMBULATORY_CARE_PROVIDER_SITE_OTHER): Payer: Medicare Other | Admitting: Family Medicine

## 2015-06-24 ENCOUNTER — Encounter: Payer: Self-pay | Admitting: Family Medicine

## 2015-06-24 VITALS — BP 160/80 | HR 86 | Temp 98.8°F | Resp 18 | Ht 61.5 in | Wt 135.0 lb

## 2015-06-24 DIAGNOSIS — R05 Cough: Secondary | ICD-10-CM | POA: Diagnosis not present

## 2015-06-24 DIAGNOSIS — H6504 Acute serous otitis media, recurrent, right ear: Secondary | ICD-10-CM | POA: Diagnosis not present

## 2015-06-24 DIAGNOSIS — H6691 Otitis media, unspecified, right ear: Secondary | ICD-10-CM | POA: Insufficient documentation

## 2015-06-24 DIAGNOSIS — I10 Essential (primary) hypertension: Secondary | ICD-10-CM | POA: Diagnosis not present

## 2015-06-24 DIAGNOSIS — R059 Cough, unspecified: Secondary | ICD-10-CM

## 2015-06-24 MED ORDER — PREDNISONE 5 MG PO TABS: 5.0000 mg | ORAL_TABLET | Freq: Two times a day (BID) | ORAL | Status: AC

## 2015-06-24 MED ORDER — BENZONATATE 100 MG PO CAPS: 100.0000 mg | ORAL_CAPSULE | Freq: Two times a day (BID) | ORAL | Status: DC | PRN

## 2015-06-24 NOTE — Patient Instructions (Signed)
Keep  April appointment as before, call if you need me sooner  Please use antibiotic ear drop prescribed by ENT and DO NOT STICK ANYTHING IN YOU right ear, there is infection in the ear  Continue liquid cough syrups as before  Take prednisone tablet as prescribed for 5 days  Tessalon perles MAY be used also if the help with the cough  Cough is viral/ post infectious , I do NOT recommend antibiotics now  Blood pressure is HIGH, NEED to take  medication every day at same time  [facility] you soon improve  All the best for 2017!

## 2015-06-24 NOTE — Assessment & Plan Note (Signed)
1 week h/o right ear pain and increase hearing loss, currently using topical antibiotic ear drops per ENT , advised to complete course and return to ENT for re eval Reminded her of need to avoid putting any object in her affected ear

## 2015-06-24 NOTE — Progress Notes (Signed)
   Subjective:    Patient ID: Rebecca Gutierrez, female    DOB: [birth year], 75 y.o.   MRN: [id number]  HPI 10 day h/o cough and chest congestion, started as left maxillary pressure, green nasal drainage and right ear pain with increased hearing loss. Saw ENT 1 week ago, prescribed oral antibiotic for sinus infection, was unable to tolerate by day 3 due to GI symptoms, has not resumed them and sinus symptoms are cleared. Still has significant right ear symptoms which worry her  Review of Systems See HPI re throat. Denies chest congestion, productive cough or wheezing. Denies chest pains, palpitations and leg swelling Denies abdominal pain, nausea, vomiting,diarrhea or constipation.   Denies dysuria, frequency, hesitancy or incontinence. Denies joint pain, swelling and limitation in mobility. Denies headaches, seizures, numbness, or tingling. Denies depression, anxiety or insomnia. Denies skin break down or rash.        Objective:   Physical Exam BP 160/80 mmHg  Pulse 86  Temp(Src) 98.8 F (37.1 C)  Resp 18  Ht 5' 1.5" (1.562 m)  Wt 135 lb (61.236 kg)  BMI 25.10 kg/m2  SpO2 96% Patient alert and oriented and in no cardiopulmonary distress.  HEENT: No facial asymmetry, EOMI,   oropharynx pink and moist.  Neck supple no JVD, no mass. No sinus tenderness, no excessive nasal drainage, positive erythema and edema of nasal mucosa. Right TM erythematous and edematous Chest: Clear to auscultation bilaterally.  CVS: S1, S2 no murmurs, no S3.Regular rate.  ABD: Soft non tender.   Ext: No edema  MS: Adequate ROM spine, shoulders, hips and knees.  Skin: Intact, no ulcerations or rash noted.  Psych: Good eye contact, normal affect. Memory intact not anxious or depressed appearing.  CNS: CN 2-12 intact, power,  normal throughout.no focal deficits noted.        Assessment & Plan:  ROM (right otitis media) 1 week h/o right ear pain and increase hearing loss, currently  using topical antibiotic ear drops per ENT , advised to complete course and return to ENT for re eval Reminded her of need to avoid putting any object in her affected ear  Cough Post viral cough,  Pt stopped oral antibiotic for presumed left maxillary sinus infection after 3 days due to GI symptoms. Denies fever, chills or discolored sputum, drainage or sinus pain, symptomatic treatment, continue OTC robitussin DM in the day and cough suppressant syrup at night which she states is effective Prednisone for 5 days and as needed tessalon perle prescribed  ESSENTIAL HYPERTENSION, BENIGN Uncontrolled due to non compliance , admits to forgetting medication often, importance of compliance discussed DASH diet and commitment to daily physical activity for a minimum of 30 minutes discussed and encouraged, as a part of hypertension management. The importance of attaining a healthy weight is also discussed.  BP/Weight 06/24/2015 04/02/2015 12/21/2014 12/11/2014 09/17/2014 06/19/2014 99991111  Systolic BP 0000000 [id number] - 0000000  Diastolic BP 80 80 60 68 78 - 80  Wt. (Lbs) 135 136.4 133 134.08 134 133 133  BMI 25.1 25.36 24.32 24.52 24.5 24.32 24.32

## 2015-06-24 NOTE — Assessment & Plan Note (Signed)
Post viral cough,  Pt stopped oral antibiotic for presumed left maxillary sinus infection after 3 days due to GI symptoms. Denies fever, chills or discolored sputum, drainage or sinus pain, symptomatic treatment, continue OTC robitussin DM in the day and cough suppressant syrup at night which she states is effective Prednisone for 5 days and as needed tessalon perle prescribed

## 2015-06-24 NOTE — Assessment & Plan Note (Signed)
Uncontrolled due to non compliance , admits to forgetting medication often, importance of compliance discussed DASH diet and commitment to daily physical activity for a minimum of 30 minutes discussed and encouraged, as a part of hypertension management. The importance of attaining a healthy weight is also discussed.  BP/Weight 06/24/2015 04/02/2015 12/21/2014 12/11/2014 09/17/2014 06/19/2014 [id number]  Systolic BP 0000000 [id number] - 0000000  Diastolic BP 80 80 60 68 78 - 80  Wt. (Lbs) 135 136.4 133 134.08 134 133 133  BMI 25.1 25.36 24.32 24.52 24.5 24.32 24.32

## 2015-07-05 ENCOUNTER — Telehealth: Payer: Self-pay

## 2015-07-05 DIAGNOSIS — J329 Chronic sinusitis, unspecified: Secondary | ICD-10-CM

## 2015-07-05 NOTE — Telephone Encounter (Signed)
Patient aware and in to collect specimen collection cup

## 2015-07-05 NOTE — Telephone Encounter (Signed)
Yes I have ordered sinus film, pls order the cultiure of her drainage and let her know

## 2015-07-05 NOTE — Addendum Note (Signed)
Addended by: Denman George B on: 07/05/2015 01:43 PM   Modules accepted: Orders

## 2015-07-08 LAB — RESPIRATORY CULTURE OR RESPIRATORY AND SPUTUM CULTURE: Organism ID, Bacteria: NORMAL

## 2015-07-09 ENCOUNTER — Telehealth: Payer: Self-pay | Admitting: Family Medicine

## 2015-07-09 NOTE — Telephone Encounter (Signed)
Patient is calling requesting lab results.

## 2015-07-09 NOTE — Telephone Encounter (Signed)
Patient aware of results.

## 2015-07-10 ENCOUNTER — Ambulatory Visit (HOSPITAL_COMMUNITY)
Admission: RE | Admit: 2015-07-10 | Discharge: 2015-07-10 | Disposition: A | Discharge status: Home / Self Care | Payer: Medicare Other | Source: Ambulatory Visit | Attending: Family Medicine | Admitting: Family Medicine

## 2015-07-10 DIAGNOSIS — J329 Chronic sinusitis, unspecified: Secondary | ICD-10-CM | POA: Insufficient documentation

## 2015-07-11 ENCOUNTER — Other Ambulatory Visit: Payer: Self-pay

## 2015-07-26 ENCOUNTER — Other Ambulatory Visit: Payer: Self-pay | Admitting: Family Medicine

## 2015-08-20 ENCOUNTER — Ambulatory Visit (INDEPENDENT_AMBULATORY_CARE_PROVIDER_SITE_OTHER): Payer: Medicare Other | Admitting: Urology

## 2015-08-20 DIAGNOSIS — N952 Postmenopausal atrophic vaginitis: Secondary | ICD-10-CM

## 2015-08-20 DIAGNOSIS — N390 Urinary tract infection, site not specified: Secondary | ICD-10-CM

## 2015-09-12 ENCOUNTER — Other Ambulatory Visit: Payer: Self-pay | Admitting: Family Medicine

## 2015-09-19 ENCOUNTER — Other Ambulatory Visit: Payer: Self-pay | Admitting: Family Medicine

## 2015-09-19 DIAGNOSIS — Z1231 Encounter for screening mammogram for malignant neoplasm of breast: Secondary | ICD-10-CM

## 2015-09-25 ENCOUNTER — Telehealth: Payer: Self-pay | Admitting: Family Medicine

## 2015-09-25 DIAGNOSIS — E785 Hyperlipidemia, unspecified: Secondary | ICD-10-CM

## 2015-09-25 DIAGNOSIS — I10 Essential (primary) hypertension: Secondary | ICD-10-CM

## 2015-09-25 NOTE — Telephone Encounter (Signed)
Labs ordered and patient aware.  Given number to call and check on what holiday hours that lab is taking

## 2015-09-25 NOTE — Telephone Encounter (Signed)
Patient is scheduled to see Dr. Moshe Cipro on Tuesday next week and she is asking if Lab Work is needed before, please advise?

## 2015-09-27 ENCOUNTER — Ambulatory Visit (HOSPITAL_COMMUNITY)
Admission: RE | Admit: 2015-09-27 | Discharge: 2015-09-27 | Disposition: A | Discharge status: Home / Self Care | Payer: Medicare Other | Source: Ambulatory Visit | Attending: Family Medicine | Admitting: Family Medicine

## 2015-09-27 DIAGNOSIS — Z1231 Encounter for screening mammogram for malignant neoplasm of breast: Secondary | ICD-10-CM | POA: Insufficient documentation

## 2015-09-27 LAB — COMPREHENSIVE METABOLIC PANEL
ALT: 18 U/L (ref 6–29)
AST: 20 U/L (ref 10–35)
Albumin: 4.4 g/dL (ref 3.6–5.1)
Alkaline Phosphatase: 50 U/L (ref 33–130)
BILIRUBIN TOTAL: 1.6 mg/dL — AB (ref 0.2–1.2)
BUN: 21 mg/dL (ref 7–25)
CHLORIDE: 103 mmol/L (ref 98–110)
CO2: 29 mmol/L (ref 20–31)
CREATININE: 0.73 mg/dL (ref 0.60–0.93)
Calcium: 9.4 mg/dL (ref 8.6–10.4)
GLUCOSE: 91 mg/dL (ref 65–99)
Potassium: 3.5 mmol/L (ref 3.5–5.3)
SODIUM: 140 mmol/L (ref 135–146)
Total Protein: 6.5 g/dL (ref 6.1–8.1)

## 2015-09-27 LAB — LIPID PANEL
CHOL/HDL RATIO: 2.1 ratio (ref <=5.0)
Cholesterol: 175 mg/dL (ref 125–200)
HDL: 83 mg/dL (ref >=46)
LDL CALC: 79 mg/dL (ref <130)
Triglycerides: 65 mg/dL (ref <150)
VLDL: 13 mg/dL (ref <30)

## 2015-10-01 ENCOUNTER — Ambulatory Visit (INDEPENDENT_AMBULATORY_CARE_PROVIDER_SITE_OTHER): Payer: Medicare Other | Admitting: Family Medicine

## 2015-10-01 ENCOUNTER — Encounter: Payer: Self-pay | Admitting: Family Medicine

## 2015-10-01 VITALS — BP 160/80 | HR 58 | Resp 18 | Ht 61.5 in | Wt 136.0 lb

## 2015-10-01 DIAGNOSIS — E559 Vitamin D deficiency, unspecified: Secondary | ICD-10-CM

## 2015-10-01 DIAGNOSIS — I10 Essential (primary) hypertension: Secondary | ICD-10-CM

## 2015-10-01 DIAGNOSIS — Z1211 Encounter for screening for malignant neoplasm of colon: Secondary | ICD-10-CM | POA: Diagnosis not present

## 2015-10-01 DIAGNOSIS — Z0000 Encounter for general adult medical examination without abnormal findings: Secondary | ICD-10-CM | POA: Diagnosis not present

## 2015-10-01 DIAGNOSIS — E785 Hyperlipidemia, unspecified: Secondary | ICD-10-CM

## 2015-10-01 LAB — POC HEMOCCULT BLD/STL (OFFICE/1-CARD/DIAGNOSTIC): Fecal Occult Blood, POC: NEGATIVE

## 2015-10-01 NOTE — Progress Notes (Signed)
   Subjective:    Patient ID: Rebecca Gutierrez, female    DOB: 08-Feb-1941, 75 y.o.   MRN: [id number]  HPI  Patient is in for annual physical exam. No other health concerns are expressed d at the visit. Recent labs, if available are reviewed. Immunization is reviewed , and  updated if needed.   Review of Systems See HPI     Objective:   Physical Exam BP 160/80 mmHg  Pulse 58  Resp 18  Ht 5' 1.5" (1.562 m)  Wt 136 lb (61.689 kg)  BMI 25.28 kg/m2  SpO2 96%  Pleasant well nourished female, alert and oriented x 3, in no cardio-pulmonary distress. Afebrile. HEENT No facial trauma or asymetry. Sinuses non tender.  Extra occullar muscles intact, pupils equally reactive to light. External ears normal, tympanic membranes clear. Oropharynx moist, no exudate, poor  dentition. Neck: supple, no adenopathy,JVD or thyromegaly.No bruits.  Chest: Clear to ascultation bilaterally.No crackles or wheezes. Non tender to palpation  Breast: No asymetry,no masses or lumps. No tenderness. No nipple discharge or inversion. No axillary or supraclavicular adenopathy  Cardiovascular system; Heart sounds normal,  S1 and  S2 ,no S3.  No murmur, or thrill. Apical beat not displaced Peripheral pulses normal.  Abdomen: Soft, non tender, no organomegaly or masses. No bruits. Bowel sounds normal. No guarding, tenderness or rebound.  Rectal:  Normal sphincter tone. No mass.No rectal masses.  Guaiac negative stool.  GU: External genitalia normal female genitalia , female distribution of hair. No lesions. Urethral meatus normal in size, no  Prolapse, no lesions visibly  Present. Bladder non tender. Vagina pink and moist , with no visible lesions , discharge present . Cystocele  noted  No adnexal masses, no  adnexal tenderness.   Musculoskeletal exam: Full ROM of spine, hips , shoulders and knees. No deformity ,swelling or crepitus noted. No muscle wasting or atrophy.    Neurologic: Cranial nerves Marked hearing loss, bilateral hearing aids. Power, tone ,sensation and reflexes normal throughout. No disturbance in gait. No tremor.  Skin: Intact, no ulceration, erythema , hyperpigmented rough lesion on left forearm Pigmentation normal throughout  Psych; Normal mood and affect. Judgement and concentration normal        Assessment & Plan:  Annual physical exam Annual exam as documented. Counseling done  re healthy lifestyle involving commitment to 150 minutes exercise per week, heart healthy diet, and attaining healthy weight.The importance of adequate sleep also discussed. Immunization and cancer screening needs are specifically addressed at this visit.   ESSENTIAL HYPERTENSION, BENIGN Uncontrolled, no change in medication as patient reportsnormal blood pressures at home. DASH diet and commitment to daily physical activity for a minimum of 30 minutes discussed and encouraged, as a part of hypertension management. The importance of attaining a healthy weight is also discussed.  BP/Weight 10/01/2015 06/24/2015 04/02/2015 12/21/2014 12/11/2014 Q000111Q [id number]  Systolic BP 0000000 0000000 [id number] -  Diastolic BP 80 80 80 60 68 78 -  Wt. (Lbs) 136 135 136.4 133 134.08 134 133  BMI 25.28 25.1 25.36 24.32 24.52 24.5 24.32   Nurse BP re evaluation in 6 to 8 weeks

## 2015-10-01 NOTE — Patient Instructions (Addendum)
Annual wellness OCt 19 or after, call if you need me sooner  Fasting CBC, lipid, cmp and EGFR, TSH and vit D Oct 14 or after  Excellent labs, normal mammogram  You need to return with blood pressure cuff for nurse B P re check, your blood pressure is too high, otherwise exam today is good, in 2 months, bring your cuff Please work on good  health habits so that your health will improve. 1. Commitment to daily physical activity for 30 to 60  minutes, if you are able to do this.  2. Commitment to wise food choices. Aim for half of your  food intake to be vegetable and fruit, one quarter starchy foods, and one quarter protein. Try to eat on a regular schedule  3 meals per day, snacking between meals should be limited to vegetables or fruits or small portions of nuts. 64 ounces of water per day is generally recommended, unless you have specific health conditions, like heart failure or kidney failure where you will need to limit fluid intake.  3. Commitment to sufficient and a  good quality of physical and mental rest daily, generally between 6 to 8 hours per day.  WITH PERSISTANCE AND PERSEVERANCE, THE IMPOSSIBLE , BECOMES THE NORM!    DASH Eating Plan DASH stands for "Dietary Approaches to Stop Hypertension." The DASH eating plan is a healthy eating plan that has been shown to reduce high blood pressure (hypertension). Additional health benefits may include reducing the risk of type 2 diabetes mellitus, heart disease, and stroke. The DASH eating plan may also help with weight loss. WHAT DO I NEED TO KNOW ABOUT THE DASH EATING PLAN? For the DASH eating plan, you will follow these general guidelines:  Choose foods with a percent daily value for sodium of less than 5% (as listed on the food label).  Use salt-free seasonings or herbs instead of table salt or sea salt.  Check with your health care provider or pharmacist before using salt substitutes.  Eat lower-sodium products, often labeled as  "lower sodium" or "no salt added."  Eat fresh foods.  Eat more vegetables, fruits, and low-fat dairy products.  Choose whole grains. Look for the word "whole" as the first word in the ingredient list.  Choose fish and skinless chicken or [place] more often than red meat. Limit fish, poultry, and meat to 6 oz (170 g) each day.  Limit sweets, desserts, sugars, and sugary drinks.  Choose heart-healthy fats.  Limit cheese to 1 oz (28 g) per day.  Eat more home-cooked food and less restaurant, buffet, and fast food.  Limit fried foods.  Cook foods using methods other than frying.  Limit canned vegetables. If you do use them, rinse them well to decrease the sodium.  When eating at a restaurant, ask that your food be prepared with less salt, or no salt if possible. WHAT FOODS CAN I EAT? Seek help from a dietitian for individual calorie needs. Grains Whole grain or whole wheat bread. Brown rice. Whole grain or whole wheat pasta. Quinoa, bulgur, and whole grain cereals. Low-sodium cereals. Corn or whole wheat flour tortillas. Whole grain cornbread. Whole grain crackers. Low-sodium crackers. Vegetables Fresh or frozen vegetables (raw, steamed, roasted, or grilled). Low-sodium or reduced-sodium tomato and vegetable juices. Low-sodium or reduced-sodium tomato sauce and paste. Low-sodium or reduced-sodium canned vegetables.  Fruits All fresh, canned (in natural juice), or frozen fruits. Meat and Other Protein Products Ground beef (85% or leaner), grass-fed beef, or beef trimmed  of fat. Skinless chicken or [place]. Ground chicken or [place]. Pork trimmed of fat. All fish and seafood. Eggs. Dried beans, peas, or lentils. Unsalted nuts and seeds. Unsalted canned beans. Dairy Low-fat dairy products, such as skim or 1% milk, 2% or reduced-fat cheeses, low-fat ricotta or cottage cheese, or plain low-fat yogurt. Low-sodium or reduced-sodium cheeses. Fats and Oils Tub margarines without trans fats.  Light or reduced-fat mayonnaise and salad dressings (reduced sodium). Avocado. Safflower, olive, or canola oils. Natural peanut or almond butter. Other Unsalted popcorn and pretzels. The items listed above may not be a complete list of recommended foods or beverages. Contact your dietitian for more options. WHAT FOODS ARE NOT RECOMMENDED? Grains White bread. White pasta. White rice. Refined cornbread. Bagels and croissants. Crackers that contain trans fat. Vegetables Creamed or fried vegetables. Vegetables in a cheese sauce. Regular canned vegetables. Regular canned tomato sauce and paste. Regular tomato and vegetable juices. Fruits Dried fruits. Canned fruit in light or heavy syrup. Fruit juice. Meat and Other Protein Products Fatty cuts of meat. Ribs, chicken wings, bacon, sausage, bologna, salami, chitterlings, fatback, hot dogs, bratwurst, and packaged luncheon meats. Salted nuts and seeds. Canned beans with salt. Dairy Whole or 2% milk, cream, half-and-half, and cream cheese. Whole-fat or sweetened yogurt. Full-fat cheeses or blue cheese. Nondairy creamers and whipped toppings. Processed cheese, cheese spreads, or cheese curds. Condiments Onion and garlic salt, seasoned salt, table salt, and sea salt. Canned and packaged gravies. Worcestershire sauce. Tartar sauce. Barbecue sauce. Teriyaki sauce. Soy sauce, including reduced sodium. Steak sauce. Fish sauce. Oyster sauce. Cocktail sauce. Horseradish. Ketchup and mustard. Meat flavorings and tenderizers. Bouillon cubes. Hot sauce. Tabasco sauce. Marinades. Taco seasonings. Relishes. Fats and Oils Butter, stick margarine, lard, shortening, ghee, and bacon fat. Coconut, palm kernel, or palm oils. Regular salad dressings. Other Pickles and olives. Salted popcorn and pretzels. The items listed above may not be a complete list of foods and beverages to avoid. Contact your dietitian for more information. WHERE CAN I FIND MORE  INFORMATION? National Heart, Lung, and Blood Institute: travelstabloid.com   This information is not intended to replace advice given to you by your health care provider. Make sure you discuss any questions you have with your health care provider.   Document Released: 05/21/2011 Document Revised: 06/22/2014 Document Reviewed: 04/05/2013 Elsevier Interactive Patient Education 2016 [employer]. Thank you  for choosing [facility]. We consider it a privelige to serve you.  Delivering excellent health care in a caring and  compassionate way is our goal.  Partnering with you,  so that together we can achieve this goal is our strategy.  \

## 2015-10-02 DIAGNOSIS — Z0000 Encounter for general adult medical examination without abnormal findings: Secondary | ICD-10-CM | POA: Insufficient documentation

## 2015-10-02 NOTE — Assessment & Plan Note (Signed)
Uncontrolled, no change in medication as patient reportsnormal blood pressures at home. DASH diet and commitment to daily physical activity for a minimum of 30 minutes discussed and encouraged, as a part of hypertension management. The importance of attaining a healthy weight is also discussed.  BP/Weight 10/01/2015 06/24/2015 04/02/2015 12/21/2014 12/11/2014 Q000111Q 99991111  Systolic BP 0000000 0000000 [id number] -  Diastolic BP 80 80 80 60 68 78 -  Wt. (Lbs) 136 135 136.4 133 134.08 134 133  BMI 25.28 25.1 25.36 24.32 24.52 24.5 24.32   Nurse BP re evaluation in 6 to 8 weeks

## 2015-10-02 NOTE — Assessment & Plan Note (Signed)
Annual exam as documented. Counseling done  re healthy lifestyle involving commitment to 150 minutes exercise per week, heart healthy diet, and attaining healthy weight.The importance of adequate sleep also discussed.  Immunization and cancer screening needs are specifically addressed at this visit.  

## 2015-10-25 ENCOUNTER — Other Ambulatory Visit: Payer: Self-pay | Admitting: Family Medicine

## 2015-12-09 ENCOUNTER — Other Ambulatory Visit: Payer: Self-pay | Admitting: Family Medicine

## 2015-12-12 ENCOUNTER — Other Ambulatory Visit: Payer: Self-pay | Admitting: Family Medicine

## 2015-12-31 ENCOUNTER — Ambulatory Visit: Payer: Medicare Other

## 2015-12-31 VITALS — BP 136/78

## 2015-12-31 DIAGNOSIS — I10 Essential (primary) hypertension: Secondary | ICD-10-CM

## 2016-01-08 ENCOUNTER — Telehealth: Payer: Self-pay | Admitting: Family Medicine

## 2016-01-08 ENCOUNTER — Other Ambulatory Visit: Payer: Self-pay | Admitting: Family Medicine

## 2016-01-08 MED ORDER — CIPROFLOXACIN HCL 500 MG PO TABS: 500.0000 mg | ORAL_TABLET | Freq: Two times a day (BID) | ORAL | 0 refills | Status: DC

## 2016-01-08 NOTE — Telephone Encounter (Signed)
Patient aware.

## 2016-01-08 NOTE — Telephone Encounter (Signed)
I have sent 3 days ofcipro , let her know needs to come for oV if not improved

## 2016-01-08 NOTE — Telephone Encounter (Signed)
has a uti but lives an hour away, was wondering if Dr. Moshe Cipro could call in cipro

## 2016-01-08 NOTE — Telephone Encounter (Signed)
States since Monday she has frequency and urgency and some cloudiness and some dysuria. Lives in [place] now and wants to know since she was just here last week for her BP check if she can just get some cipro called in and she will come in if symptoms still persist after that

## 2016-02-10 ENCOUNTER — Ambulatory Visit
Admission: EM | Admit: 2016-02-10 | Discharge: 2016-02-10 | Disposition: A | Discharge status: Home / Self Care | Payer: Medicare Other | Attending: Family Medicine | Admitting: Family Medicine

## 2016-02-10 DIAGNOSIS — N390 Urinary tract infection, site not specified: Secondary | ICD-10-CM

## 2016-02-10 LAB — URINALYSIS COMPLETE WITH MICROSCOPIC (ARMC ONLY)
Bilirubin Urine: NEGATIVE
Glucose, UA: NEGATIVE mg/dL
KETONES UR: NEGATIVE mg/dL
NITRITE: NEGATIVE
PH: 6.5 (ref 5.0–8.0)
Protein, ur: NEGATIVE mg/dL
SPECIFIC GRAVITY, URINE: 1.01 (ref 1.005–1.030)

## 2016-02-10 MED ORDER — CIPROFLOXACIN HCL 500 MG PO TABS: 500.0000 mg | ORAL_TABLET | Freq: Two times a day (BID) | ORAL | 0 refills | Status: DC

## 2016-02-10 NOTE — ED Provider Notes (Signed)
[facility] ____________________________________________  Time seen: Approximately 12:44 PM  I have reviewed the triage vital signs and the nursing notes.   HISTORY  Chief Complaint Urinary Tract Infection   HPI Rebecca Gutierrez is a 75 y.o. female presenting for the complaints of urinary frequency and urinary urgency for the last 2 days. Patient reports a slight discomfort immediately after voiding, denies any other discomfort. Patient does reports she feels like her urine has appeared cloudy. Patient reports she does have a history of urinary tract infections and this is consistent with her past urinary tract infections. Patient reports her last urinary tract infection was at the beginning of July and what she was treated with a 3 day course of Cipro. Patient reports that she does feel that the UTI did fully resolve. Denies vaginal or pelvic discomfort.   Patient denies known trigger. Reports has continued to eat and drink well. Denies any abdominal pain back pain, flank pain, dizziness, weakness, fevers, nausea, vomiting or diarrhea. Patient reports that she feels well otherwise. Patient denies any history of renal insufficiency or resistance to antibiotics.  Patient reports that her primary care physician is in [place], [place].   Past Medical History:  Diagnosis Date  . Anxiety   . Gilbert's syndrome   . Hearing loss 1975  . Hyperlipidemia 2000  . Hypertension 2000    Patient Active Problem List   Diagnosis Date Noted  . Annual physical exam 10/02/2015  . Head trauma 12/11/2014  . Cystocele 09/13/2013  . Palpitations 06/04/2012  . OSTEOPENIA 06/28/2009  . Hyperlipidemia LDL goal <100 06/02/2007  . GILBERT'S SYNDROME 06/02/2007  . HEARING LOSS 06/02/2007  . ESSENTIAL HYPERTENSION, BENIGN 06/02/2007    Past Surgical History:  Procedure Laterality Date  . ANTERIOR AND POSTERIOR REPAIR N/A 05/02/2013   Procedure: ANTERIOR (CYSTOCELE) AND  POSTERIOR REPAIR (RECTOCELE);  Surgeon: [name], MD;  Location: [facility];  Service: Gynecology;  Laterality: N/A;  . COLONOSCOPY  08/28/2011   Procedure: COLONOSCOPY;  Surgeon: [name], MD;  Location: [facility];  Service: Endoscopy;  Laterality: N/A;  930  . EYE SURGERY Bilateral 02/13/2011   other 2013  . TUBAL LIGATION    . VAGINAL HYSTERECTOMY N/A 05/02/2013   Procedure: HYSTERECTOMY VAGINAL;  Surgeon: [name], MD;  Location: [facility];  Service: Gynecology;  Laterality: N/A;    No current facility-administered medications for this encounter.   Current Outpatient Prescriptions:  .  aspirin EC 81 MG tablet, Take 81 mg by mouth at bedtime. , Disp: , Rfl:  .  hydrochlorothiazide (HYDRODIURIL) 25 MG tablet, TAKE 1 TABLET DAILY, Disp: 90 tablet, Rfl: 0 .  metoprolol tartrate (LOPRESSOR) 25 MG tablet, Take 25 mg by mouth 2 (two) times daily.  , Disp: , Rfl:  .  ramipril (ALTACE) 10 MG capsule, TAKE 1 CAPSULE DAILY, Disp: 90 capsule, Rfl: 1 .  simvastatin (ZOCOR) 10 MG tablet, TAKE 1 TABLET DAILY, Disp: 90 tablet, Rfl: 1 .  ciprofloxacin (CIPRO) 500 MG tablet, Take 1 tablet (500 mg total) by mouth 2 (two) times daily., Disp: 10 tablet, Rfl: 0 .  Cranberry 500 MG CAPS, Take 1 capsule by mouth 2 (two) times daily., Disp: , Rfl:  .  Probiotic Product (PROBIOTIC ADVANCED PO), Take 500 mg PE by mouth daily., Disp: , Rfl:   Allergies [name] and [name]  Family History  Problem Relation Age of Onset  . Hypertension Mother   . Colon cancer Neg Hx  Social History Social History  Substance Use Topics  . Smoking status: Never Smoker  . Smokeless tobacco: Never Used  . Alcohol use No    Review of Systems Constitutional: No fever/chills Eyes: No visual changes. ENT: No sore throat. Cardiovascular: Denies chest pain. Respiratory: Denies shortness of breath. Gastrointestinal: No abdominal pain.  No nausea, no vomiting.  No diarrhea.   No constipation. Genitourinary: Positive for dysuria. Musculoskeletal: Negative for back pain. Skin: Negative for rash. Neurological: Negative for headaches, focal weakness or numbness.  10-point ROS otherwise negative.  ____________________________________________   PHYSICAL EXAM:  VITAL SIGNS: ED Triage Vitals  Enc Vitals Group     BP 02/10/16 1232 (!) 153/68     Pulse Rate 02/10/16 1232 67     Resp 02/10/16 1232 16     Temp 02/10/16 1232 98 F (36.7 C)     Temp Source 02/10/16 1232 Oral     SpO2 02/10/16 1232 97 %     Weight 02/10/16 1228 132 lb (59.9 kg)     Height 02/10/16 1228 5' 1.75" (1.568 m)     Head Circumference --      Peak Flow --      Pain Score 02/10/16 1231 1     Pain Loc --      Pain Edu? --      Excl. in [place]? --     Constitutional: Alert and oriented. Well appearing and in no acute distress. Eyes: Conjunctivae are normal. PERRL. EOMI. ENT      Head: Normocephalic and atraumatic      Mouth/Throat: Mucous membranes are moist. Neck: No stridor. Supple without meningismus.  Hematological/Lymphatic/Immunilogical: No cervical lymphadenopathy. Cardiovascular: Normal rate, regular rhythm. Grossly normal heart sounds.  Good peripheral circulation. Respiratory: Normal respiratory effort without tachypnea nor retractions. Breath sounds are clear and equal bilaterally. No wheezes/rales/rhonchi.. Gastrointestinal: Soft and nontender. No distention. Normal Bowel sounds. No CVA tenderness. Musculoskeletal:  Nontender with normal range of motion in all extremities. No midline cervical, thoracic or lumbar tenderness to palpation. Neurologic:  Normal speech and language. No gross focal neurologic deficits are appreciated. Speech is normal. No gait instability.  Skin:  Skin is warm, dry and intact. No rash noted. Psychiatric: Mood and affect are normal. Speech and behavior are normal. Patient exhibits appropriate insight and judgment     ___________________________________________   LABS (all labs ordered are listed, but only abnormal results are displayed)  Labs Reviewed  URINALYSIS COMPLETEWITH MICROSCOPIC ([facility] ONLY) - Abnormal; Notable for the following:       Result Value   APPearance CLOUDY (*)    Hgb urine dipstick TRACE (*)    Leukocytes, UA MODERATE (*)    Bacteria, UA FEW (*)    Squamous Epithelial / LPF 0-5 (*)    All other components within normal limits  URINE CULTURE    PROCEDURES Procedures     INITIAL IMPRESSION / ASSESSMENT AND PLAN / ED COURSE  Pertinent labs & imaging results that were available during my care of the patient were reviewed by me and considered in my medical decision making (see chart for details).  Well-appearing patient. No acute distress. Presents for the complaints of 2 days of dysuria. Reports history of urinary tract infection that had similar presentation. Abdomen soft and nontender. No CVA tenderness. Urinalysis reviewed. Urinalysis reviewed in suspect urinary tract infection. Will culture urinalysis. Patient reports allergic to [facility] as well as Bactrim. Urinalysis positive for few bacteria, moderate leukocytes, trace hemoglobin, cloudy appearance. Again  will culture urinalysis. As of patient's antibiotic allergies, will treat patient with oral Cipro twice a day 5 days. Reports tolerates cipro well in past. Encouraged patient to follow-up with PCP next week. Encourage rest, fluids. Discussed indication, risks and benefits of medications with patient.  Discussed follow up with Primary care physician this week. Discussed follow up and return parameters including no resolution or any worsening concerns. Patient verbalized understanding and agreed to plan.   ____________________________________________   FINAL CLINICAL IMPRESSION(S) / ED DIAGNOSES  Final diagnoses:  UTI (lower urinary tract infection)     Discharge Medication List as of 02/10/2016 12:57 PM       Note: This dictation was prepared with Dragon dictation along with smaller phrase technology. Any transcriptional errors that result from this process are unintentional.    Clinical Course      Marylene Land, NP 02/10/16 1614    Marylene Land, NP 02/10/16 1615

## 2016-02-10 NOTE — Discharge Instructions (Signed)
Take medication as prescribed. Rest. Drink plenty of fluids.  ° °Follow up with your primary care physician this week as needed. Return to Urgent care for new or worsening concerns.  ° °

## 2016-02-10 NOTE — ED Triage Notes (Signed)
Patient states that she has had a history of UTI's. Patient states that she had one in July. Patient states that her symptoms resolved after course of cipro. Patient states that her symptoms came back on Friday. Patient states that she has been having frequency, urinary discomfort after voiding. Patient states that urine has also been cloudy.

## 2016-03-12 ENCOUNTER — Ambulatory Visit
Admission: EM | Admit: 2016-03-12 | Discharge: 2016-03-12 | Disposition: A | Discharge status: Home / Self Care | Payer: Medicare Other | Attending: Family Medicine | Admitting: Family Medicine

## 2016-03-12 ENCOUNTER — Encounter: Payer: Self-pay | Admitting: *Deleted

## 2016-03-12 DIAGNOSIS — N390 Urinary tract infection, site not specified: Secondary | ICD-10-CM | POA: Diagnosis not present

## 2016-03-12 LAB — URINALYSIS COMPLETE WITH MICROSCOPIC (ARMC ONLY)
Bilirubin Urine: NEGATIVE
Glucose, UA: NEGATIVE mg/dL
Ketones, ur: NEGATIVE mg/dL
NITRITE: POSITIVE — AB
PH: 6 (ref 5.0–8.0)
Protein, ur: NEGATIVE mg/dL
Specific Gravity, Urine: <1.005 — ABNORMAL LOW (ref 1.005–1.030)

## 2016-03-12 MED ORDER — CIPROFLOXACIN HCL 500 MG PO TABS: 500.0000 mg | ORAL_TABLET | Freq: Two times a day (BID) | ORAL | 0 refills | Status: AC

## 2016-03-12 NOTE — ED Triage Notes (Signed)
Patient started having started having feeling of pressure and urinary frequency last PM.

## 2016-03-12 NOTE — Discharge Instructions (Signed)
Take medication as prescribed. Rest. Drink plenty of fluids.  ° °Follow up with your primary care physician this week as needed. Return to Urgent care for new or worsening concerns.  ° °

## 2016-03-12 NOTE — ED Provider Notes (Signed)
[facility] ____________________________________________  Time seen: Approximately 1:07 PM  I have reviewed the triage vital signs and the nursing notes.   HISTORY  Chief Complaint Recurrent UTI  HPI Rebecca Gutierrez is a 75 y.o. female presents for the complaints of urinary frequency, urinary urgency and a slight burning sensation with urination that started last night. Patient reports feels consistent with her past urinary tract infections. Denies any pain or discomfort in absence of actively urinating. Denies any abdominal pain, back pain, nausea, vomiting or diarrhea. Reports continues to eat and drink well. Reports normal energy levels. Denies abnormal bleeding or vaginal complaints.  Patient reports last urinary tract infection was one month ago. Patient reports she has had a history of UTIs that were more frequent but reports she did have a UTI for over a year until one month ago. Denies known triggers. [name] as well as Omnicef. Denies renal insufficiency. Denies chest pain, shortness breath, weakness, dizziness or other complaints.  PCP: Rebecca Gutierrez. Reports has an appointment with PCP next week.   Past Medical History:  Diagnosis Date  . Anxiety   . Gilbert's syndrome   . Hearing loss 1975  . Hyperlipidemia 2000  . Hypertension 2000    Patient Active Problem List   Diagnosis Date Noted  . Annual physical exam 10/02/2015  . Head trauma 12/11/2014  . Cystocele 09/13/2013  . Palpitations 06/04/2012  . OSTEOPENIA 06/28/2009  . Hyperlipidemia LDL goal <100 06/02/2007  . GILBERT'S SYNDROME 06/02/2007  . HEARING LOSS 06/02/2007  . ESSENTIAL HYPERTENSION, BENIGN 06/02/2007    Past Surgical History:  Procedure Laterality Date  . ANTERIOR AND POSTERIOR REPAIR N/A 05/02/2013   Procedure: ANTERIOR (CYSTOCELE) AND POSTERIOR REPAIR (RECTOCELE);  Surgeon: [name], MD;  Location: [facility];  Service: Gynecology;  Laterality:  N/A;  . COLONOSCOPY  08/28/2011   Procedure: COLONOSCOPY;  Surgeon: [name], MD;  [facility];  Service: Endoscopy;  Laterality: N/A;  930  . EYE SURGERY Bilateral 02/13/2011   other 2013  . TUBAL LIGATION    . VAGINAL HYSTERECTOMY N/A 05/02/2013   Procedure: HYSTERECTOMY VAGINAL;  Surgeon: [name], MD;  Location: [facility];  Service: Gynecology;  Laterality: N/A;    Current Outpatient Rx  . Order #: [id number] Class: Historical Med  . Order #: [id number] Class: Normal  . Order #: [id number] Class: Historical Med  . Order #: [id number] Class: Normal  . Order #: [id number] Class: Normal  . Order #: [id number] Class: Normal  . Order #: [id number] Class: Historical Med  . Order #: [id number] Class: Historical Med    No current facility-administered medications for this encounter.   Current Outpatient Prescriptions:  .  aspirin EC 81 MG tablet, Take 81 mg by mouth at bedtime. , Disp: , Rfl:  .  hydrochlorothiazide (HYDRODIURIL) 25 MG tablet, TAKE 1 TABLET DAILY, Disp: 90 tablet, Rfl: 0 .  metoprolol tartrate (LOPRESSOR) 25 MG tablet, Take 25 mg by mouth 2 (two) times daily.  , Disp: , Rfl:  .  ramipril (ALTACE) 10 MG capsule, TAKE 1 CAPSULE DAILY, Disp: 90 capsule, Rfl: 1 .  simvastatin (ZOCOR) 10 MG tablet, TAKE 1 TABLET DAILY, Disp: 90 tablet, Rfl: 1 .  ciprofloxacin (CIPRO) 500 MG tablet, Take 1 tablet (500 mg total) by mouth 2 (two) times daily., Disp: 14 tablet, Rfl: 0 .  Cranberry 500 MG CAPS, Take 1 capsule by mouth 2 (two) times daily., Disp: , Rfl:  .  Probiotic Product (PROBIOTIC ADVANCED  PO), Take 500 mg PE by mouth daily., Disp: , Rfl:   Allergies [name] and [name]  Family History  Problem Relation Age of Onset  . Hypertension Mother   . Colon cancer Neg Hx     Social History Social History  Substance Use Topics  . Smoking status: Never Smoker  . Smokeless tobacco: Never Used  . Alcohol use No    Review of  Systems Constitutional: No fever/chills Eyes: No visual changes. ENT: No sore throat. Cardiovascular: Denies chest pain. Respiratory: Denies shortness of breath. Gastrointestinal: No abdominal pain.  No nausea, no vomiting.  No diarrhea.  No constipation. Genitourinary: positive for dysuria. Musculoskeletal: Negative for back pain. Skin: Negative for rash. Neurological: Negative for headaches, focal weakness or numbness.  10-point ROS otherwise negative.  ____________________________________________   PHYSICAL EXAM:  VITAL SIGNS: ED Triage Vitals  Enc Vitals Group     BP 03/12/16 1245 (!) 151/78     Pulse Rate 03/12/16 1245 (!) 50 Recheck 62     Resp 03/12/16 1245 16     Temp 03/12/16 1245 97.7 F (36.5 C)     Temp Source 03/12/16 1245 Oral     SpO2 03/12/16 1245 98 %     Weight --      Height --      Head Circumference --      Peak Flow --      Pain Score 03/12/16 1251 0     Pain Loc --      Pain Edu? --      Excl. in [place]? --     Constitutional: Alert and oriented. Well appearing and in no acute distress. Eyes: Conjunctivae are normal. PERRL. EOMI. ENT      Head: Normocephalic and atraumatic.      Mouth/Throat: Mucous membranes are moist. Cardiovascular: Normal rate, regular rhythm. Grossly normal heart sounds.  Good peripheral circulation. Respiratory: Normal respiratory effort without tachypnea nor retractions. Breath sounds are clear and equal bilaterally. No wheezes/rales/rhonchi.. Gastrointestinal: Soft and nontender. No distention. Normal Bowel sounds. No CVA tenderness. Musculoskeletal:  No midline cervical, thoracic or lumbar tenderness to palpation. Neurologic:  Normal speech and language. No gross focal neurologic deficits are appreciated. Speech is normal. No gait instability.  Skin:  Skin is warm, dry and intact. No rash noted. Psychiatric: Mood and affect are normal. Speech and behavior are normal. Patient exhibits appropriate insight and judgment    ___________________________________________   LABS (all labs ordered are listed, but only abnormal results are displayed)  Labs Reviewed  URINALYSIS COMPLETEWITH MICROSCOPIC ([facility] ONLY) - Abnormal; Notable for the following:       Result Value   Color, Urine STRAW (*)    APPearance CLOUDY (*)    Specific Gravity, Urine <1.005 (*)    Hgb urine dipstick SMALL (*)    Nitrite POSITIVE (*)    Leukocytes, UA MODERATE (*)    Bacteria, UA MANY (*)    Squamous Epithelial / LPF 0-5 (*)    All other components within normal limits  URINE CULTURE    PROCEDURES Procedures     INITIAL IMPRESSION / ASSESSMENT AND PLAN / ED COURSE  Pertinent labs & imaging results that were available during my care of the patient were reviewed by me and considered in my medical decision making (see chart for details).  Well-appearing patient. No acute distress. Presents for the complaint of dysuria. Urinalysis reviewed. Suspect UTI. Patient elderly patient with history of being allergic to [facility] as well  as Bactrim. STates thinks omnicef caused diarrhea, but not sure if it caused rash- states will review home records.Patient reports she has tolerated Cipro very well in the past. As no recent urine culture visible, will treat patient UTI with Cipro twice a day 7 days. Counseled regarding close follow-up with PCP as well as may need to follow-up with urology.Discussed indication, risks and benefits of medications with patient.  Discussed follow up with Primary care physician this week. Discussed follow up and return parameters including no resolution or any worsening concerns. Patient verbalized understanding and agreed to plan.   ____________________________________________   FINAL CLINICAL IMPRESSION(S) / ED DIAGNOSES  Final diagnoses:  UTI (lower urinary tract infection)     Discharge Medication List as of 03/12/2016  1:27 PM      Note: This dictation was prepared with Dragon dictation along with  smaller phrase technology. Any transcriptional errors that result from this process are unintentional.    Clinical Course      Marylene Land, NP 03/12/16 1414    Marylene Land, NP 03/12/16 1415

## 2016-03-24 ENCOUNTER — Encounter: Payer: Self-pay | Admitting: Internal Medicine

## 2016-03-24 ENCOUNTER — Ambulatory Visit (INDEPENDENT_AMBULATORY_CARE_PROVIDER_SITE_OTHER): Payer: Medicare Other | Admitting: Internal Medicine

## 2016-03-24 VITALS — BP 138/76 | HR 66 | Resp 16 | Ht 62.0 in | Wt 134.8 lb

## 2016-03-24 DIAGNOSIS — H9193 Unspecified hearing loss, bilateral: Secondary | ICD-10-CM

## 2016-03-24 DIAGNOSIS — R002 Palpitations: Secondary | ICD-10-CM

## 2016-03-24 DIAGNOSIS — K219 Gastro-esophageal reflux disease without esophagitis: Secondary | ICD-10-CM

## 2016-03-24 DIAGNOSIS — Z87440 Personal history of urinary (tract) infections: Secondary | ICD-10-CM

## 2016-03-24 DIAGNOSIS — Z23 Encounter for immunization: Secondary | ICD-10-CM | POA: Diagnosis not present

## 2016-03-24 DIAGNOSIS — I10 Essential (primary) hypertension: Secondary | ICD-10-CM

## 2016-03-24 DIAGNOSIS — E785 Hyperlipidemia, unspecified: Secondary | ICD-10-CM | POA: Diagnosis not present

## 2016-03-24 DIAGNOSIS — E559 Vitamin D deficiency, unspecified: Secondary | ICD-10-CM | POA: Diagnosis not present

## 2016-03-24 HISTORY — DX: Personal history of urinary (tract) infections: Z87.440

## 2016-03-24 NOTE — Patient Instructions (Signed)
Calcium + Vitamin D ---1200 mg calcium per day plus the vitamin D that is included.  Take TUMS as needed for reflux/heartburn

## 2016-03-24 NOTE — Progress Notes (Signed)
Date:  03/24/2016   Name:  Rebecca Gutierrez   DOB:  October 02, 1940   MRN:  [id number]   Chief Complaint: Establish Care (Moved from [place] ) and Heartburn (Had pain from chest to throat and it burned and she burped and it lasted a week but [name] better today. )  Heartburn  She complains of heartburn. She reports no abdominal pain, no chest pain, no choking, no coughing or no wheezing. This is a new problem. The current episode started 1 to 4 weeks ago. The problem occurs rarely. The problem has been resolved. The heartburn is located in the substernum. Pertinent negatives include no fatigue.  Hypertension  This is a chronic problem. The current episode started more than 1 year ago. The problem is unchanged. The problem is controlled. Pertinent negatives include no chest pain, headaches, palpitations or shortness of breath. Past treatments include beta blockers, ACE inhibitors and diuretics. The current treatment provides significant improvement.  Hyperlipidemia  This is a chronic problem. The problem is controlled. Recent lipid tests were reviewed and are normal. Pertinent negatives include no chest pain or shortness of breath. Current antihyperlipidemic treatment includes statins. The current treatment provides significant improvement of lipids.  Gilbert's Syndrome - found on labs and monitored every 6 months.  She is due for labs now.  No recent problems or change in skin color.  HM - mammogram in April.  DEXA in 2015 - not taking calcium or vitamin D. Hx of very low normal vitamin D levels.  Review of Systems  Constitutional: Negative for chills, fatigue and fever.  HENT: Positive for hearing loss. Negative for ear discharge, ear pain, tinnitus, trouble swallowing and voice change.   Eyes: Negative for visual disturbance.  Respiratory: Negative for apnea, cough, choking, shortness of breath and wheezing.   Cardiovascular: Negative for chest pain, palpitations and leg swelling.    Gastrointestinal: Positive for heartburn. Negative for abdominal distention, abdominal pain, blood in stool, constipation and diarrhea.  Genitourinary: Negative for difficulty urinating, dysuria, hematuria, vaginal discharge and vaginal pain.  Musculoskeletal: Negative for arthralgias.  Neurological: Negative for weakness, numbness and headaches.  Hematological: Negative for adenopathy.  Psychiatric/Behavioral: Negative for dysphoric mood and sleep disturbance. The patient is not nervous/anxious.     Patient Active Problem List   Diagnosis Date Noted  . Annual physical exam 10/02/2015  . Head trauma 12/11/2014  . Cystocele 09/13/2013  . Palpitations 06/04/2012  . OSTEOPENIA 06/28/2009  . Hyperlipidemia LDL goal <100 06/02/2007  . GILBERT'S SYNDROME 06/02/2007  . HEARING LOSS 06/02/2007  . ESSENTIAL HYPERTENSION, BENIGN 06/02/2007    Prior to Admission medications   Medication Sig Start Date End Date Taking? Authorizing Provider  aspirin EC 81 MG tablet Take 81 mg by mouth at bedtime.    Yes Historical Provider, MD  Cranberry 500 MG CAPS Take 1 capsule by mouth 2 (two) times daily.   Yes Historical Provider, MD  hydrochlorothiazide (HYDRODIURIL) 25 MG tablet TAKE 1 TABLET DAILY 10/25/15  Yes Fayrene Helper, MD  metoprolol tartrate (LOPRESSOR) 25 MG tablet Take 25 mg by mouth 2 (two) times daily.     Yes Historical Provider, MD  ramipril (ALTACE) 10 MG capsule TAKE 1 CAPSULE DAILY 12/12/15  Yes Fayrene Helper, MD  simvastatin (ZOCOR) 10 MG tablet TAKE 1 TABLET DAILY 12/09/15  Yes Fayrene Helper, MD    Allergies  Allergen Reactions  . [name] Diarrhea  . [name]     Past Surgical History:  Procedure Laterality Date  . ANTERIOR AND POSTERIOR REPAIR N/A 05/02/2013   Procedure: ANTERIOR (CYSTOCELE) AND POSTERIOR REPAIR (RECTOCELE);  Surgeon: [name], MD;  Location: [facility];  Service: Gynecology;  Laterality: N/A;  . COLONOSCOPY   08/28/2011   Procedure: COLONOSCOPY;  Surgeon: [name], MD;  [facility];  Service: Endoscopy;  Laterality: N/A;  930  . EYE SURGERY Bilateral 02/13/2011   other 2013  . TUBAL LIGATION    . VAGINAL HYSTERECTOMY N/A 05/02/2013   Procedure: HYSTERECTOMY VAGINAL;  Surgeon: [name], MD;  Location: [facility];  Service: Gynecology;  Laterality: N/A;    Social History  Substance Use Topics  . Smoking status: Never Smoker  . Smokeless tobacco: Never Used  . Alcohol use No     Medication list has been reviewed and updated.   Physical Exam  Constitutional: She is oriented to person, place, and time. She appears well-developed. No distress.  HENT:  Head: Normocephalic and atraumatic.  Neck: Normal range of motion. Neck supple. No thyromegaly present.  Cardiovascular: Normal rate, regular rhythm and normal heart sounds.   Pulmonary/Chest: Effort normal and breath sounds normal. No respiratory distress. She has no wheezes.  Abdominal: Soft. Bowel sounds are normal. She exhibits no distension. There is no tenderness. There is no rebound.  Musculoskeletal: Normal range of motion. She exhibits no edema or tenderness.  Neurological: She is alert and oriented to person, place, and time.  Skin: Skin is warm and dry. No rash noted.  Psychiatric: She has a normal mood and affect. Her speech is normal and behavior is normal. Thought content normal.  Nursing note and vitals reviewed.   BP 138/76   Pulse 66   Resp 16   Ht 5\' 2"  (1.575 m)   Wt 134 lb 12.8 oz (61.1 kg)   SpO2 97%   BMI 24.66 kg/m   Assessment and Plan: 1. Essential hypertension, benign controlled - Comprehensive metabolic panel - CBC with Differential/Platelet  2. Palpitations Controlled with low dose beta blocker  3. Hyperlipidemia LDL goal <100 Continue statin therapy - Lipid panel  4. Disorder of bilirubin excretion stable - Comprehensive metabolic panel  5. History of recurrent urinary  tract infection No sx today - seen by Urology in [place] in the past 6 months.  6. Vitamin D deficiency - VITAMIN D 25 Hydroxy (Vit-D Deficiency, Fractures)  7. Gastroesophageal reflux disease, esophagitis presence not specified May take TUMS as needed  8. Need for influenza vaccination - Flu Vaccine QUAD 36+ mos IM  9. Bilateral hearing loss, unspecified hearing loss type    Halina Maidens, MD [facility]  03/24/2016

## 2016-04-02 LAB — LIPID PANEL
CHOL/HDL RATIO: 2.6 ratio (ref 0.0–4.4)
Cholesterol, Total: 187 mg/dL (ref 100–199)
HDL: 71 mg/dL (ref >39)
LDL Calculated: 95 mg/dL (ref 0–99)
Triglycerides: 104 mg/dL (ref 0–149)
VLDL CHOLESTEROL CAL: 21 mg/dL (ref 5–40)

## 2016-04-02 LAB — COMPREHENSIVE METABOLIC PANEL
ALT: 18 IU/L (ref 0–32)
AST: 19 IU/L (ref 0–40)
Albumin/Globulin Ratio: 1.9 (ref 1.2–2.2)
Albumin: 4.4 g/dL (ref 3.5–4.8)
Alkaline Phosphatase: 65 IU/L (ref 39–117)
BUN/Creatinine Ratio: 21 (ref 12–28)
BUN: 18 mg/dL (ref 8–27)
Bilirubin Total: 1.2 mg/dL (ref 0.0–1.2)
CHLORIDE: 99 mmol/L (ref 96–106)
CO2: 28 mmol/L (ref 18–29)
Calcium: 9.6 mg/dL (ref 8.7–10.3)
Creatinine, Ser: 0.84 mg/dL (ref 0.57–1.00)
GFR, EST AFRICAN AMERICAN: 80 mL/min/{1.73_m2} (ref >59)
GFR, EST NON AFRICAN AMERICAN: 69 mL/min/{1.73_m2} (ref >59)
GLUCOSE: 97 mg/dL (ref 65–99)
Globulin, Total: 2.3 g/dL (ref 1.5–4.5)
Potassium: 4.2 mmol/L (ref 3.5–5.2)
Sodium: 141 mmol/L (ref 134–144)
TOTAL PROTEIN: 6.7 g/dL (ref 6.0–8.5)

## 2016-04-02 LAB — CBC WITH DIFFERENTIAL/PLATELET
Basophils Absolute: 0 10*3/uL (ref 0.0–0.2)
Basos: 1 %
EOS (ABSOLUTE): 0.2 10*3/uL (ref 0.0–0.4)
EOS: 4 %
HEMATOCRIT: 40.3 % (ref 34.0–46.6)
Hemoglobin: 13.9 g/dL (ref 11.1–15.9)
Immature Grans (Abs): 0 10*3/uL (ref 0.0–0.1)
Immature Granulocytes: 0 %
LYMPHS ABS: 1.1 10*3/uL (ref 0.7–3.1)
Lymphs: 30 %
MCH: 30 pg (ref 26.6–33.0)
MCHC: 34.5 g/dL (ref 31.5–35.7)
MCV: 87 fL (ref 79–97)
MONOS ABS: 0.3 10*3/uL (ref 0.1–0.9)
Monocytes: 9 %
Neutrophils Absolute: 2.1 10*3/uL (ref 1.4–7.0)
Neutrophils: 56 %
Platelets: 223 10*3/uL (ref 150–379)
RBC: 4.64 x10E6/uL (ref 3.77–5.28)
RDW: 13.8 % (ref 12.3–15.4)
WBC: 3.7 10*3/uL (ref 3.4–10.8)

## 2016-04-02 LAB — VITAMIN D 25 HYDROXY (VIT D DEFICIENCY, FRACTURES): Vit D, 25-Hydroxy: 32.2 ng/mL (ref 30.0–100.0)

## 2016-05-01 ENCOUNTER — Ambulatory Visit (INDEPENDENT_AMBULATORY_CARE_PROVIDER_SITE_OTHER): Payer: Medicare Other | Admitting: Internal Medicine

## 2016-05-01 ENCOUNTER — Encounter: Payer: Self-pay | Admitting: Internal Medicine

## 2016-05-01 VITALS — BP 142/80 | HR 86 | Resp 16 | Ht 62.0 in | Wt 136.6 lb

## 2016-05-01 DIAGNOSIS — F410 Panic disorder [episodic paroxysmal anxiety] without agoraphobia: Secondary | ICD-10-CM

## 2016-05-01 DIAGNOSIS — R1032 Left lower quadrant pain: Secondary | ICD-10-CM

## 2016-05-01 DIAGNOSIS — N3001 Acute cystitis with hematuria: Secondary | ICD-10-CM

## 2016-05-01 LAB — POC URINALYSIS WITH MICROSCOPIC (NON AUTO)MANUAL RESULT
BILIRUBIN UA: NEGATIVE
CRYSTALS: 0
EPITHELIAL CELLS, URINE PER MICROSCOPY: 0
GLUCOSE UA: NEGATIVE
Ketones, UA: NEGATIVE
Mucus, UA: 0
Nitrite, UA: NEGATIVE
Protein, UA: NEGATIVE
RBC: 0 M/uL — AB (ref 4.04–5.48)
Spec Grav, UA: 1.02
UROBILINOGEN UA: 0.2
WBC Casts, UA: 5
pH, UA: 6

## 2016-05-01 MED ORDER — CIPROFLOXACIN HCL 250 MG PO TABS: 250.0000 mg | ORAL_TABLET | Freq: Two times a day (BID) | ORAL | 0 refills | Status: DC

## 2016-05-01 NOTE — Progress Notes (Signed)
Date:  05/01/2016   Name:  Rebecca Gutierrez   DOB:  Feb 08, 1941   MRN:  [id number]   Chief Complaint: Anxiety (Gets very nervous and panic attack all of sudden. ); Abdominal Pain (Doe snot think this is UTI: LLQ pain anmd pressure non severe. Has hx of ovarian cyst and Hx UTI last few months.); and Headache Anxiety  Presents for initial visit. Onset was at an unknown time. Symptoms include nervous/anxious behavior. Patient reports no dizziness, excessive worry, nausea, palpitations or shortness of breath. Symptoms occur occasionally. The severity of symptoms is mild. The symptoms are aggravated by family issues.   Treatments tried: talked to daughter and relaxed - felt better after 30 minutes.  Abdominal Pain  The problem occurs intermittently. The pain is located in the LLQ. The abdominal pain does not radiate. Pertinent negatives include no constipation, diarrhea, dysuria, fever, frequency, headaches, hematuria, nausea or vomiting. Nothing aggravates the pain.  History of ovary attached to abdominal wall at time of hysterectomy.  She does not think that she has a UTI.  Review of Systems  Constitutional: Negative for fever.  Respiratory: Negative for choking and shortness of breath.   Cardiovascular: Negative for palpitations.  Gastrointestinal: Positive for abdominal pain. Negative for constipation, diarrhea, nausea and vomiting.  Genitourinary: Positive for difficulty urinating (has cystocele and has to strain to empty completely). Negative for dysuria, frequency, hematuria and urgency.  Neurological: Positive for tremors (during panic attack). Negative for dizziness, syncope, numbness and headaches.  Psychiatric/Behavioral: The patient is nervous/anxious.     Patient Active Problem List   Diagnosis Date Noted  . History of recurrent urinary tract infection 03/24/2016  . Annual physical exam 10/02/2015  . Cystocele 09/13/2013  . Palpitations 06/04/2012  . OSTEOPENIA 06/28/2009    . Hyperlipidemia LDL goal <100 06/02/2007  . 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 HEARING LOSS 06/02/2007  . ESSENTIAL HYPERTENSION, BENIGN 06/02/2007    Prior to Admission medications   Medication Sig Start Date End Date Taking? Authorizing Provider  aspirin EC 81 MG tablet Take 81 mg by mouth at bedtime.    Yes Historical Provider, MD  Cholecalciferol (VITAMIN D HIGH POTENCY PO) Take by mouth.   Yes Historical Provider, MD  Cranberry 500 MG CAPS Take 1 capsule by mouth 2 (two) times daily.   Yes Historical Provider, MD  hydrochlorothiazide (HYDRODIURIL) 25 MG tablet TAKE 1 TABLET DAILY 10/25/15  Yes Fayrene Helper, MD  metoprolol tartrate (LOPRESSOR) 25 MG tablet Take 25 mg by mouth 2 (two) times daily.     Yes Historical Provider, MD  ramipril (ALTACE) 10 MG capsule TAKE 1 CAPSULE DAILY 12/12/15  Yes Fayrene Helper, MD  simvastatin (ZOCOR) 10 MG tablet TAKE 1 TABLET DAILY 12/09/15  Yes Fayrene Helper, MD    Allergies  Allergen Reactions  . [name] Diarrhea  . [name]     Past Surgical History:  Procedure Laterality Date  . ANTERIOR AND POSTERIOR REPAIR N/A 05/02/2013   Procedure: ANTERIOR (CYSTOCELE) AND POSTERIOR REPAIR (RECTOCELE);  Surgeon: [name], MD;  Location: [facility];  Service: Gynecology;  Laterality: N/A;  . COLONOSCOPY  08/28/2011   Procedure: COLONOSCOPY;  Surgeon: [name], MD;  Location: [facility];  Service: Endoscopy;  Laterality: N/A;  930  . EYE SURGERY Bilateral 02/13/2011   other 2013  . TUBAL LIGATION    . VAGINAL HYSTERECTOMY N/A 05/02/2013   Procedure: HYSTERECTOMY VAGINAL;  Surgeon: [name], MD;  Location: [facility];  Service: Gynecology;  Laterality: N/A;    Social History  Substance Use Topics  . Smoking status: Never Smoker  . Smokeless tobacco: Never Used  . Alcohol use No     Medication list has been reviewed and updated.   Physical Exam  Constitutional: She is  oriented to person, place, and time. She appears well-developed. No distress.  HENT:  Head: Normocephalic and atraumatic.  Neck: Normal range of motion. Neck supple. No thyromegaly present.  Cardiovascular: Normal rate, regular rhythm and normal heart sounds.   Pulmonary/Chest: Effort normal and breath sounds normal. No respiratory distress.  Genitourinary: There is no tenderness, lesion or injury on the right labia. There is no tenderness, lesion or injury on the left labia. Right adnexum displays no mass, no tenderness and no fullness. Left adnexum displays no mass, no tenderness and no fullness. No erythema (cystocele noted) in the vagina.  Musculoskeletal: Normal range of motion. She exhibits no edema or tenderness.  Neurological: She is alert and oriented to person, place, and time.  Skin: Skin is warm and dry. No rash noted.  Psychiatric: She has a normal mood and affect. Her behavior is normal. Thought content normal.  Nursing note and vitals reviewed.   BP (!) 142/80   Pulse 86   Resp 16   Ht 5\' 2"  (1.575 m)   Wt 136 lb 9.6 oz (62 kg)   SpO2 98%   BMI 24.98 kg/m   Assessment and Plan: 1. Abdominal discomfort in left lower quadrant Exam benign - doubt ovarian etiology If sx worsen after treatment of UTI, will consider [name] - POC urinalysis w microscopic (non auto)  2. Acute cystitis with hematuria - ciprofloxacin (CIPRO) 250 MG tablet; Take 1 tablet (250 mg total) by mouth 2 (two) times daily.  Dispense: 10 tablet; Refill: 0  3. Panic attack Reassurance - no medication for now Return if recurrent  Halina Maidens, MD [facility]  05/01/2016

## 2016-05-04 ENCOUNTER — Ambulatory Visit (INDEPENDENT_AMBULATORY_CARE_PROVIDER_SITE_OTHER): Payer: Medicare Other | Admitting: Internal Medicine

## 2016-05-04 ENCOUNTER — Encounter: Payer: Self-pay | Admitting: Internal Medicine

## 2016-05-04 VITALS — BP 142/84 | HR 72 | Temp 98.3°F | Resp 16 | Wt 136.0 lb

## 2016-05-04 DIAGNOSIS — R002 Palpitations: Secondary | ICD-10-CM | POA: Diagnosis not present

## 2016-05-04 DIAGNOSIS — I10 Essential (primary) hypertension: Secondary | ICD-10-CM | POA: Diagnosis not present

## 2016-05-04 NOTE — Patient Instructions (Addendum)
Increase Metoprolol to 37.5 mg every 12 hours (take 1 and 1/2 of 25 mg tablets)  Hold the Cipro for now (have had 3 full days of therapy) since it may be keeping you awake

## 2016-05-04 NOTE — Progress Notes (Signed)
Date:  05/04/2016   Name:  Rebecca Gutierrez   DOB:  [birth year]   MRN:  [id number]   Chief Complaint: Tachycardia (She was last seen on Friday 05/01/16 and was started on Cipro. The past 3 nights she has not been able to sleep due to heart flutter that comes and goes, she has noticed that her pulse has been running higher and b/p over the weekend. She wonders if Cipro contributing to it. Some of her b/p readigns have been 146/82, 158/66, 151/70, 164/78 . Pulse has been 67, 80, 70, 73.)  The above sx for the past three nights.  She feels that her heart is beating a bit fast and harder.  There is no pain or SOB, no dizziness.  She had some minor sx for several weeks but it was worse last night - it kept her awake with worry for 4 hours.  She has not had much sx so far today.  She is not sure if it from the UTI or the Cipro which she has taken several times in the past.  Review of Systems  Constitutional: Positive for fatigue. Negative for chills and diaphoresis.  Respiratory: Negative for cough, chest tightness, shortness of breath and wheezing.   Cardiovascular: Positive for palpitations. Negative for chest pain and leg swelling.  Neurological: Negative for dizziness, tremors, syncope, weakness and headaches.    Patient Active Problem List   Diagnosis Date Noted  . History of recurrent urinary tract infection 03/24/2016  . Annual physical exam 10/02/2015  . Cystocele 09/13/2013  . Palpitations 06/04/2012  . OSTEOPENIA 06/28/2009  . Hyperlipidemia LDL goal <100 06/02/2007  . 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 Hearing loss 06/02/2007  . Essential hypertension, benign 06/02/2007    Prior to Admission medications   Medication Sig Start Date End Date Taking? Authorizing Provider  aspirin EC 81 MG tablet Take 81 mg by mouth at bedtime.    Yes Historical Provider, MD  Cholecalciferol (VITAMIN D HIGH POTENCY PO) Take by mouth.   Yes Historical Provider, MD  ciprofloxacin  (CIPRO) 250 MG tablet Take 1 tablet (250 mg total) by mouth 2 (two) times daily. 05/01/16  Yes [name], MD  Cranberry 500 MG CAPS Take 1 capsule by mouth 2 (two) times daily.   Yes Historical Provider, MD  hydrochlorothiazide (HYDRODIURIL) 25 MG tablet TAKE 1 TABLET DAILY 10/25/15  Yes Fayrene Helper, MD  metoprolol tartrate (LOPRESSOR) 25 MG tablet Take 25 mg by mouth 2 (two) times daily.     Yes Historical Provider, MD  ramipril (ALTACE) 10 MG capsule TAKE 1 CAPSULE DAILY 12/12/15  Yes Fayrene Helper, MD  simvastatin (ZOCOR) 10 MG tablet TAKE 1 TABLET DAILY 12/09/15  Yes Fayrene Helper, MD    Allergies  Allergen Reactions  . [name] Diarrhea  . [name]     Past Surgical History:  Procedure Laterality Date  . ANTERIOR AND POSTERIOR REPAIR N/A 05/02/2013   Procedure: ANTERIOR (CYSTOCELE) AND POSTERIOR REPAIR (RECTOCELE);  Surgeon: [name], MD;  Location: [facility];  Service: Gynecology;  Laterality: N/A;  . COLONOSCOPY  08/28/2011   Procedure: COLONOSCOPY;  Surgeon: [name], MD;  [facility];  Service: Endoscopy;  Laterality: N/A;  930  . EYE SURGERY Bilateral 02/13/2011   other 2013  . TUBAL LIGATION    . VAGINAL HYSTERECTOMY N/A 05/02/2013   Procedure: HYSTERECTOMY VAGINAL;  Surgeon: [name], MD;  Location: [facility];  Service: Gynecology;  Laterality: N/A;    Social History  Substance Use Topics  . Smoking status: Never Smoker  . Smokeless tobacco: Never Used  . Alcohol use No     Medication list has been reviewed and updated.   Physical Exam  Constitutional: She is oriented to person, place, and time. She appears well-developed. No distress.  HENT:  Head: Normocephalic and atraumatic.  Cardiovascular: Normal rate, regular rhythm, S2 normal, normal heart sounds and intact distal pulses.   No extrasystoles are present. PMI is not displaced.   No murmur heard. Accentuated S2  Pulmonary/Chest:  Effort normal. No respiratory distress.  Musculoskeletal: Normal range of motion.  Neurological: She is alert and oriented to person, place, and time.  Skin: Skin is warm and dry. No rash noted.  Psychiatric: She has a normal mood and affect. Her behavior is normal. Thought content normal.  Nursing note and vitals reviewed.   BP (!) 142/84   Pulse 72   Temp 98.3 F (36.8 C)   Resp 16   Wt 136 lb (61.7 kg)   BMI 24.87 kg/m   Assessment and Plan: 1. Palpitations Possibly worsened by UTI Increase metoprolol to 37.5 mg every 12 hr - EKG 12-Lead  2. Essential hypertension, benign Should improve with increased dose of metoprolol Call if persistently elevated after the holiday   Halina Maidens, MD [facility]  05/04/2016

## 2016-05-12 ENCOUNTER — Telehealth: Payer: Self-pay

## 2016-05-12 NOTE — Telephone Encounter (Signed)
Would like to see if Urologist is in network. Which one do you recommend so she can see if he is in network?

## 2016-05-12 NOTE — Telephone Encounter (Signed)
[facility] or [facility] Dr. Jacqlyn Larsen.

## 2016-05-19 ENCOUNTER — Telehealth: Payer: Self-pay | Admitting: Internal Medicine

## 2016-05-19 ENCOUNTER — Other Ambulatory Visit: Payer: Self-pay | Admitting: Internal Medicine

## 2016-05-19 DIAGNOSIS — Z87440 Personal history of urinary (tract) infections: Secondary | ICD-10-CM

## 2016-05-19 NOTE — Telephone Encounter (Signed)
Patient would like a referral to an Urologist - Dr. Erlene Quan at [facility] and would like an appt in [place].

## 2016-05-22 ENCOUNTER — Other Ambulatory Visit: Payer: Self-pay | Admitting: Internal Medicine

## 2016-05-22 ENCOUNTER — Encounter: Payer: Self-pay | Admitting: Internal Medicine

## 2016-05-22 ENCOUNTER — Ambulatory Visit (INDEPENDENT_AMBULATORY_CARE_PROVIDER_SITE_OTHER): Payer: Medicare Other | Admitting: Internal Medicine

## 2016-05-22 VITALS — BP 160/80 | HR 75 | Temp 98.4°F | Resp 16 | Ht 62.0 in | Wt 136.0 lb

## 2016-05-22 DIAGNOSIS — R300 Dysuria: Secondary | ICD-10-CM

## 2016-05-22 LAB — POCT URINALYSIS DIPSTICK
BILIRUBIN UA: NEGATIVE
GLUCOSE UA: NEGATIVE
Ketones, UA: NEGATIVE
Leukocytes, UA: NEGATIVE
Nitrite, UA: NEGATIVE
Protein, UA: NEGATIVE
RBC UA: NEGATIVE
SPEC GRAV UA: 1.01
UROBILINOGEN UA: 0.2
pH, UA: 6

## 2016-05-22 NOTE — Progress Notes (Signed)
Date:  05/22/2016   Name:  Rebecca Gutierrez   DOB:  [birth year]   MRN:  [id number]   Chief Complaint: Urinary Tract Infection Urinary Tract Infection   This is a recurrent (recently took Cipro) problem. The pain is mild. There has been no fever. Associated symptoms include frequency. Pertinent negatives include no chills, discharge or flank pain. She has tried increased fluids for the symptoms. Her past medical history is significant for recurrent UTIs.      Review of Systems  Constitutional: Negative for chills, fatigue and fever.  Respiratory: Negative for cough, chest tightness, shortness of breath and wheezing.   Cardiovascular: Negative for chest pain and palpitations.  Genitourinary: Positive for frequency. Negative for flank pain.  Musculoskeletal: Positive for back pain.    Patient Active Problem List   Diagnosis Date Noted  . History of recurrent urinary tract infection 03/24/2016  . Annual physical exam 10/02/2015  . Cystocele 09/13/2013  . Palpitations 06/04/2012  . OSTEOPENIA 06/28/2009  . Hyperlipidemia LDL goal <100 06/02/2007  . 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 Hearing loss 06/02/2007  . Essential hypertension, benign 06/02/2007    Prior to Admission medications   Medication Sig Start Date End Date Taking? Authorizing Provider  aspirin EC 81 MG tablet Take 81 mg by mouth at bedtime.    Yes Historical Provider, MD  Cholecalciferol (VITAMIN D HIGH POTENCY PO) Take by mouth.   Yes Historical Provider, MD  Cranberry 500 MG CAPS Take 1 capsule by mouth 2 (two) times daily.   Yes Historical Provider, MD  hydrochlorothiazide (HYDRODIURIL) 25 MG tablet TAKE 1 TABLET DAILY 10/25/15  Yes Fayrene Helper, MD  metoprolol tartrate (LOPRESSOR) 25 MG tablet Take 25 mg by mouth 2 (two) times daily.     Yes Historical Provider, MD  ramipril (ALTACE) 10 MG capsule TAKE 1 CAPSULE DAILY 12/12/15  Yes Fayrene Helper, MD  simvastatin (ZOCOR) 10 MG tablet TAKE 1  TABLET DAILY 12/09/15  Yes Fayrene Helper, MD    Allergies  Allergen Reactions  . [name] Diarrhea  . [name]     Past Surgical History:  Procedure Laterality Date  . ANTERIOR AND POSTERIOR REPAIR N/A 05/02/2013   Procedure: ANTERIOR (CYSTOCELE) AND POSTERIOR REPAIR (RECTOCELE);  Surgeon: [name], MD;  Location: [facility];  Service: Gynecology;  Laterality: N/A;  . COLONOSCOPY  08/28/2011   Procedure: COLONOSCOPY;  Surgeon: [name], MD;  [facility];  Service: Endoscopy;  Laterality: N/A;  930  . EYE SURGERY Bilateral 02/13/2011   other 2013  . TUBAL LIGATION    . VAGINAL HYSTERECTOMY N/A 05/02/2013   Procedure: HYSTERECTOMY VAGINAL;  Surgeon: [name], MD;  Location: [facility];  Service: Gynecology;  Laterality: N/A;    Social History  Substance Use Topics  . Smoking status: Never Smoker  . Smokeless tobacco: Never Used  . Alcohol use No     Medication list has been reviewed and updated.   Physical Exam  Constitutional: She is oriented to person, place, and time. She appears well-developed. No distress.  HENT:  Head: Normocephalic and atraumatic.  Neck: Normal range of motion. Neck supple.  Cardiovascular: Normal rate, regular rhythm and normal heart sounds.   Pulmonary/Chest: Effort normal and breath sounds normal. No respiratory distress.  Musculoskeletal: Normal range of motion.  Neurological: She is alert and oriented to person, place, and time.  Skin: Skin is warm and dry. No rash noted.  Psychiatric:  She has a normal mood and affect. Her behavior is normal. Thought content normal.  Nursing note and vitals reviewed.   BP (!) 160/80   Pulse 75   Temp 98.4 F (36.9 C) (Oral)   Resp 16   Ht 5\' 2"  (1.575 m)   Wt 136 lb (61.7 kg)   SpO2 98%   BMI 24.87 kg/m   Assessment and Plan: 1. Dysuria Urine dipstick is negative - will get Cx and wait for Urology evaluation next week No antibiotics for  now - POCT urinalysis dipstick - Urine culture   Halina Maidens, MD [facility]  05/22/2016

## 2016-05-25 ENCOUNTER — Ambulatory Visit: Payer: Medicare Other | Admitting: Internal Medicine

## 2016-05-26 LAB — URINE CULTURE: ORGANISM ID, BACTERIA: NO GROWTH

## 2016-05-28 ENCOUNTER — Other Ambulatory Visit: Payer: Self-pay | Admitting: *Deleted

## 2016-05-28 DIAGNOSIS — N390 Urinary tract infection, site not specified: Secondary | ICD-10-CM

## 2016-05-29 ENCOUNTER — Ambulatory Visit (INDEPENDENT_AMBULATORY_CARE_PROVIDER_SITE_OTHER): Payer: Medicare Other | Admitting: Urology

## 2016-05-29 ENCOUNTER — Encounter: Payer: Self-pay | Admitting: Urology

## 2016-05-29 ENCOUNTER — Other Ambulatory Visit
Admission: RE | Admit: 2016-05-29 | Discharge: 2016-05-29 | Disposition: A | Discharge status: Home / Self Care | Payer: Medicare Other | Source: Ambulatory Visit | Attending: Urology | Admitting: Urology

## 2016-05-29 VITALS — BP 158/79 | HR 62 | Ht 62.0 in | Wt 138.0 lb

## 2016-05-29 DIAGNOSIS — N390 Urinary tract infection, site not specified: Secondary | ICD-10-CM

## 2016-05-29 DIAGNOSIS — N952 Postmenopausal atrophic vaginitis: Secondary | ICD-10-CM | POA: Diagnosis not present

## 2016-05-29 DIAGNOSIS — N8111 Cystocele, midline: Secondary | ICD-10-CM | POA: Diagnosis not present

## 2016-05-29 LAB — BLADDER SCAN AMB NON-IMAGING: Scan Result: 202

## 2016-05-29 LAB — URINALYSIS, COMPLETE (UACMP) WITH MICROSCOPIC
BACTERIA UA: NONE SEEN
BILIRUBIN URINE: NEGATIVE
GLUCOSE, UA: NEGATIVE mg/dL
Hgb urine dipstick: NEGATIVE
Ketones, ur: NEGATIVE mg/dL
Nitrite: NEGATIVE
PH: 6 (ref 5.0–8.0)
Protein, ur: NEGATIVE mg/dL
RBC / HPF: NONE SEEN RBC/hpf (ref 0–5)
SPECIFIC GRAVITY, URINE: 1.015 (ref 1.005–1.030)

## 2016-05-29 NOTE — Progress Notes (Signed)
05/29/2016 11:06 AM   Rebecca Gutierrez March 14, 1941 [id number]  Referring provider: [name], MD [address] [place], [place] [address]  Chief Complaint  Patient presents with  . New Patient (Initial Visit)    recurrent uti referred by Aida Puffer    HPI: Patient is a 75 -year-old Caucasian female who is referred to [name] by, Dr. Carolin Coy, for recurrent urinary tract infections.  Patient states that she has had 5 urinary tract infections since July 2017.    Her symptoms with a urinary tract infection consist of frequency, cloudy urine, urgency and an uncomfortableness during urination.  She denies dysuria, gross hematuria, suprapubic pain, back pain, abdominal pain or flank pain.  She has not had any recent fevers, chills, nausea or vomiting.   She does not have a history of nephrolithiasis, GU surgery or GU trauma.    Reviewing her records,  she has had a documented positive urine cultures from 2014 2016. She has not had any documented urinary cultures since April 2016.Marland Kitchen    She is not sexually active.  She is post menopausal.   She denies constipation and/or diarrhea.   She does  engage in good perineal hygiene. She does not take tub baths.   She does not have incontinence.  She is not having pain with bladder filling.    She is not drinking a lot of water daily.  She has one cup of coffee daily and occassional diet soda.  She admits to not drinking a lot of fluids during the day.   She has recently started to drink in cranberry juice and to try to increase water intake.    She has seen Dr. Diona Fanti in the past for recurrent UTI's.    Her PVR today is 202 mL.  Her UA is unremarkable.   Marland Kitchen   PMH: Past Medical History:  Diagnosis Date  . Anxiety   . Gilbert's syndrome   . Hearing loss 1975  . Hyperlipidemia 2000  . Hypertension 2000    Surgical History: Past Surgical History:  Procedure Laterality Date  . ANTERIOR AND POSTERIOR REPAIR N/A  05/02/2013   Procedure: ANTERIOR (CYSTOCELE) AND POSTERIOR REPAIR (RECTOCELE);  Surgeon: [name], MD;  Location: [facility];  Service: Gynecology;  Laterality: N/A;  . COLONOSCOPY  08/28/2011   Procedure: COLONOSCOPY;  Surgeon: [name], MD;  [facility]: [facility];  Service: Endoscopy;  Laterality: N/A;  930  . EYE SURGERY Bilateral 02/13/2011   other 2013  . TUBAL LIGATION    . VAGINAL HYSTERECTOMY N/A 05/02/2013   Procedure: HYSTERECTOMY VAGINAL;  Surgeon: [name], MD;  Location: [facility];  Service: Gynecology;  Laterality: N/A;    Home Medications:  Allergies as of 05/29/2016      Reactions   [name] Diarrhea   [name]       Medication List       Accurate as of 05/29/16 11:06 AM. Always use your most recent med list.          aspirin EC 81 MG tablet Take 81 mg by mouth at bedtime.   Cranberry 500 MG Caps Take 1 capsule by mouth 2 (two) times daily.   hydrochlorothiazide 25 MG tablet Commonly known as:  HYDRODIURIL TAKE 1 TABLET DAILY   metoprolol tartrate 25 MG tablet Commonly known as:  LOPRESSOR Take 25 mg by mouth 2 (two) times daily.   ramipril 10 MG capsule Commonly known as:  ALTACE TAKE  1 CAPSULE DAILY   simvastatin 10 MG tablet Commonly known as:  ZOCOR TAKE 1 TABLET DAILY   VITAMIN D HIGH POTENCY PO Take by mouth.       Allergies:  Allergies  Allergen Reactions  . [name] Diarrhea  . [name]     Family History: Family History  Problem Relation Age of Onset  . Hypertension Mother   . Colon cancer Neg Hx   . Prostate cancer Neg Hx   . Kidney cancer Neg Hx   . Bladder Cancer Neg Hx     Social History:  reports that she has never smoked. She has never used smokeless tobacco. She reports that she does not drink alcohol or use drugs.  ROS: UROLOGY Frequent Urination?: No Hard to postpone urination?: No Burning/pain with urination?: No Get up at night  to urinate?: Yes Leakage of urine?: No Urine stream starts and stops?: No Trouble starting stream?: No Do you have to strain to urinate?: No Blood in urine?: No Urinary tract infection?: Yes Sexually transmitted disease?: No Injury to kidneys or bladder?: No Painful intercourse?: No Weak stream?: No Currently pregnant?: No Vaginal bleeding?: No Last menstrual period?: n  Gastrointestinal Nausea?: No Vomiting?: No Indigestion/heartburn?: No Diarrhea?: Yes Constipation?: No  Constitutional Fever: No Night sweats?: No Weight loss?: No Fatigue?: No  Skin Skin rash/lesions?: No Itching?: No  Eyes Blurred vision?: No Double vision?: No  Ears/Nose/Throat Sore throat?: No Sinus problems?: No  Hematologic/Lymphatic Swollen glands?: No Easy bruising?: No  Cardiovascular Leg swelling?: No Chest pain?: No  Respiratory Cough?: No Shortness of breath?: No  Endocrine Excessive thirst?: No  Musculoskeletal Back pain?: No Joint pain?: No  Neurological Headaches?: No Dizziness?: No  Psychologic Depression?: No Anxiety?: Yes  Physical Exam: BP (!) 158/79   Pulse 62   Ht 5\' 2"  (1.575 m)   Wt 138 lb (62.6 kg)   BMI 25.24 kg/m   Constitutional: Well nourished. Alert and oriented, No acute distress. HEENT: [place] AT, moist mucus membranes. Trachea midline, no masses. Cardiovascular: No clubbing, cyanosis, or edema. Respiratory: Normal respiratory effort, no increased work of breathing. GI: Abdomen is soft, non tender, non distended, no abdominal masses. Liver and spleen not palpable.  No hernias appreciated.  Stool sample for occult testing is not indicated.   GU: No CVA tenderness.  No bladder fullness or masses.  Normal external genitalia, normal pubic hair distribution, no lesions.  Normal urethral meatus, no lesions, no prolapse, no discharge.   No urethral masses, tenderness and/or tenderness. No bladder fullness, tenderness or masses. Normal vagina mucosa,  good estrogen effect, no discharge, no lesions, good pelvic support, Grade III cystocele is noted.  No rectocele is noted.  Cervix and uterus are surgically absent.   No adnexal/parametria masses or tenderness noted.  Anus and perineum are without rashes or lesions.    Skin: No rashes, bruises or suspicious lesions. Lymph: No cervical or inguinal adenopathy. Neurologic: Grossly intact, no focal deficits, moving all 4 extremities. Psychiatric: Normal mood and affect.  Laboratory Data: Lab Results  Component Value Date   WBC 3.7 04/01/2016   HGB 14.0 03/29/2015   HCT 40.3 04/01/2016   MCV 87 04/01/2016   PLT 223 04/01/2016    Lab Results  Component Value Date   CREATININE 0.84 04/01/2016    Lab Results  Component Value Date   HGBA1C 5.6 07/23/2011    Lab Results  Component Value Date   TSH 2.389 03/29/2015  Component Value Date/Time   CHOL 187 04/01/2016 0834   HDL 71 04/01/2016 0834   CHOLHDL 2.6 04/01/2016 0834   CHOLHDL 2.1 09/25/2015 0822   VLDL 13 09/25/2015 0822   LDLCALC 95 04/01/2016 0834    Lab Results  Component Value Date   AST 19 04/01/2016   Lab Results  Component Value Date   ALT 18 04/01/2016     Urinalysis Unremarkable.  See EPIC.  Pertinent Imaging: Results for AVALEIGH, SHELLHAMMER (MRN [id number]) as of 05/29/2016 10:56  Ref. Range 05/29/2016 10:25  Scan Result Unknown [id number]    Assessment & Plan:    1. Recurrent UTI's  - Patient does not have any recent documented history of recurrent urinary tract infections  - Patient is instructed to increase her water intake until the urine is pale yellow or clear.  I have advised her to take probiotics (yogurt, oral pills or vaginal suppositories), take cranberry pills or drink the juice and use the estrogen cream.  She is to take Vitamin C 1,000 mg daily to acidify the urine.   She should also avoid soaking in tubs and wipe front to back after urinating.    - Because of her history of recurrent  UTIs, I have asked the patient to contact our office if she should experience symptoms of urinary tract infection so that we can CATH her for an urine specimen for urinalysis and culture. This is to prevent a skin contaminant from showing up in the urine culture.  If she should have her symptoms after hours or cannot get to our office, she should notify her other providers that she needs a catheterized specimen for UA and culture.   - I reviewed the symptoms of a urinary tract infection, such as a worsening of urinary urgency and frequency, dysuria, which is painful urination and not the pain of urine hitting sensitive perineal skin, hematuria, foul-smelling urine, suprapubic pain or mental status changes. Fevers, chills, nausea and or vomiting can also be signs of a possible UTI.  Positive urinalyses and positive urine cultures that are not associated with urinary symptoms should not be treated with antibiotics.    - I explained to the patient that being exposed to unnecessary antibiotics can put her at risk for increasing resistance of the bacteria to antibiotics, C. difficile and the side effects of the antibiotics.                              - BLADDER SCAN AMB NON-IMAGING  2. Cystocele  - Patient has had a pessary in the past and she did not find them effective and she does not want to be fitted for a another one  - Asked the patient to tilt forward after urinating and an effort to completely empty her bladder  3. Vaginal atrophy  - Patient has been prescribed vaginal estrogen cream by Dr. Zannie Cove and she has not been using it  - Patient encouraged to apply the vaginal estrogen cream 3 nights weekly  - Return to clinic in 1 month for exam   Return in about 1 month (around 06/29/2016) for exam.  These notes generated with voice recognition software. I apologize for typographical errors.  Zara Council, [facility] [address] [place],  [place] [address] [phone]

## 2016-05-29 NOTE — Patient Instructions (Signed)

## 2016-06-15 HISTORY — PX: MOHS SURGERY: SUR867

## 2016-06-23 DIAGNOSIS — E7800 Pure hypercholesterolemia, unspecified: Secondary | ICD-10-CM | POA: Diagnosis not present

## 2016-06-23 DIAGNOSIS — I499 Cardiac arrhythmia, unspecified: Secondary | ICD-10-CM | POA: Diagnosis not present

## 2016-06-23 DIAGNOSIS — Z792 Long term (current) use of antibiotics: Secondary | ICD-10-CM | POA: Diagnosis not present

## 2016-06-23 DIAGNOSIS — N390 Urinary tract infection, site not specified: Secondary | ICD-10-CM | POA: Diagnosis not present

## 2016-06-23 DIAGNOSIS — I10 Essential (primary) hypertension: Secondary | ICD-10-CM | POA: Diagnosis not present

## 2016-06-23 DIAGNOSIS — Z6825 Body mass index (BMI) 25.0-25.9, adult: Secondary | ICD-10-CM | POA: Diagnosis not present

## 2016-06-23 DIAGNOSIS — Z0000 Encounter for general adult medical examination without abnormal findings: Secondary | ICD-10-CM | POA: Diagnosis not present

## 2016-06-23 DIAGNOSIS — Z79899 Other long term (current) drug therapy: Secondary | ICD-10-CM | POA: Diagnosis not present

## 2016-06-23 DIAGNOSIS — M5127 Other intervertebral disc displacement, lumbosacral region: Secondary | ICD-10-CM | POA: Diagnosis not present

## 2016-06-23 DIAGNOSIS — Z7982 Long term (current) use of aspirin: Secondary | ICD-10-CM | POA: Diagnosis not present

## 2016-07-03 ENCOUNTER — Ambulatory Visit: Payer: Medicare Other | Admitting: Urology

## 2016-07-06 ENCOUNTER — Telehealth: Payer: Self-pay | Admitting: Internal Medicine

## 2016-07-06 ENCOUNTER — Other Ambulatory Visit: Payer: Self-pay | Admitting: Internal Medicine

## 2016-07-06 MED ORDER — RAMIPRIL 10 MG PO CAPS: 10.0000 mg | ORAL_CAPSULE | Freq: Every day | ORAL | 3 refills | Status: DC

## 2016-07-06 NOTE — Telephone Encounter (Signed)
Pt called need refill on Rx Ramipril 10 mg [facility].

## 2016-07-13 ENCOUNTER — Ambulatory Visit (INDEPENDENT_AMBULATORY_CARE_PROVIDER_SITE_OTHER): Payer: Medicare HMO

## 2016-07-13 VITALS — BP 158/81 | HR 82 | Ht 62.0 in | Wt 137.6 lb

## 2016-07-13 DIAGNOSIS — N390 Urinary tract infection, site not specified: Secondary | ICD-10-CM | POA: Diagnosis not present

## 2016-07-13 LAB — MICROSCOPIC EXAMINATION

## 2016-07-13 LAB — URINALYSIS, COMPLETE
Bilirubin, UA: NEGATIVE
Glucose, UA: NEGATIVE
Ketones, UA: NEGATIVE
Nitrite, UA: NEGATIVE
PH UA: 7 (ref 5.0–7.5)
PROTEIN UA: NEGATIVE
Specific Gravity, UA: 1.01 (ref 1.005–1.030)
UUROB: 0.2 mg/dL (ref 0.2–1.0)

## 2016-07-13 NOTE — Progress Notes (Signed)
Pt presented today with c/o urinary frequency and urgency and hard to postpone urination. A CATH specimen was obtained for u/a and cx.  Pt has a f/u appt on Friday with [name].  Blood pressure (!) 158/81, pulse 82, height 5\' 2"  (1.575 m), weight 137 lb 9.6 oz (62.4 kg).

## 2016-07-14 DIAGNOSIS — C44722 Squamous cell carcinoma of skin of right lower limb, including hip: Secondary | ICD-10-CM | POA: Diagnosis not present

## 2016-07-16 LAB — CULTURE, URINE COMPREHENSIVE

## 2016-07-17 ENCOUNTER — Ambulatory Visit (INDEPENDENT_AMBULATORY_CARE_PROVIDER_SITE_OTHER): Payer: Medicare HMO | Admitting: Urology

## 2016-07-17 ENCOUNTER — Encounter: Payer: Self-pay | Admitting: Urology

## 2016-07-17 VITALS — BP 167/79 | HR 83 | Ht 62.0 in | Wt 137.0 lb

## 2016-07-17 DIAGNOSIS — N8111 Cystocele, midline: Secondary | ICD-10-CM

## 2016-07-17 DIAGNOSIS — N390 Urinary tract infection, site not specified: Secondary | ICD-10-CM | POA: Diagnosis not present

## 2016-07-17 DIAGNOSIS — N952 Postmenopausal atrophic vaginitis: Secondary | ICD-10-CM

## 2016-07-17 MED ORDER — CIPROFLOXACIN HCL 250 MG PO TABS: 250.0000 mg | ORAL_TABLET | Freq: Two times a day (BID) | ORAL | 0 refills | Status: DC

## 2016-07-17 NOTE — Progress Notes (Signed)
07/17/2016 10:26 AM   Rebecca Gutierrez [place] [birth year] [id number]  Referring provider: [name], MD [address] [place], [place] [address]  Chief Complaint  Patient presents with  . Follow-up    1 month    HPI: Patient is a 76 year old Caucasian female who presents today for a one month follow up for recurrent UTI's , cystocele and vaginal atrophy who was restarted on her vaginal estrogen cream.   Recurrent UTI's Patient is a 67 -year-old Caucasian female who is referred to [name] by, Dr. Carolin Coy, for recurrent urinary tract infections.  Patient states that she has had 5 urinary tract infections since July 2017.  Her symptoms with a urinary tract infection consist of frequency, cloudy urine, urgency and an uncomfortableness during urination.  She denies dysuria, gross hematuria, suprapubic pain, back pain, abdominal pain or flank pain.  She has not had any recent fevers, chills, nausea or vomiting.  She does not have a history of nephrolithiasis, GU surgery or GU trauma.  Reviewing her records,  she has had a documented positive urine cultures from 2014 2016. She has not had any documented urinary cultures since April 2016.  She is not sexually active.  She is post menopausal.  She denies constipation and/or diarrhea.  She does  engage in good perineal hygiene. She does not take tub baths.  She does not have incontinence.  She is not having pain with bladder filling.  She is not drinking a lot of water daily.  She has one cup of coffee daily and occasional diet soda.  She admits to not drinking a lot of fluids during the day.   She has recently started to drink in cranberry juice and to try to increase water intake.   She has seen Dr. Diona Fanti in the past for recurrent UTI's.  Her PVR was 202 mL.  Her UA was unremarkable.     Patient presented to the office on 07/13/2016 complaining of urinary frequency and urgency. She stated that the symptoms started three days prior to her  presentation to the office.  She was also having suprapubic pressure.  She was catheterized for a UA and it was sent for culture. It has grown out pansensitive Enterococcus faecalis.  Cystocele Patient has been tilting back to assist in emptying her bladder.    Vaginal atrophy She is trying to use the cream three times weekly.  She does admit to being forgetful, so she is going to put the tube on her bed stand to see if it will help her remember to use the cream.   .   PMH: Past Medical History:  Diagnosis Date  . Anxiety   . Gilbert's syndrome   . Hearing loss 1975  . Hyperlipidemia 2000  . Hypertension 2000    Surgical History: Past Surgical History:  Procedure Laterality Date  . ANTERIOR AND POSTERIOR REPAIR N/A 05/02/2013   Procedure: ANTERIOR (CYSTOCELE) AND POSTERIOR REPAIR (RECTOCELE);  Surgeon: [name], MD;  Location: [facility];  Service: Gynecology;  Laterality: N/A;  . COLONOSCOPY  08/28/2011   Procedure: COLONOSCOPY;  Surgeon: [name], MD;  [facility];  Service: Endoscopy;  Laterality: N/A;  930  . EYE SURGERY Bilateral 02/13/2011   other 2013  . TUBAL LIGATION    . VAGINAL HYSTERECTOMY N/A 05/02/2013   Procedure: HYSTERECTOMY VAGINAL;  Surgeon: [name], MD;  Location: [facility];  Service: Gynecology;  Laterality: N/A;  Home Medications:  Allergies as of 07/17/2016      Reactions   [name] Diarrhea   [name]       Medication List       Accurate as of 07/17/16 10:26 AM. Always use your most recent med list.          aspirin EC 81 MG tablet Take 81 mg by mouth at bedtime.   ciprofloxacin 250 MG tablet Commonly known as:  CIPRO Take 1 tablet (250 mg total) by mouth 2 (two) times daily.   Cranberry 500 MG Caps Take 1 capsule by mouth 2 (two) times daily.   estradiol 0.1 MG/GM vaginal cream Commonly known as:  ESTRACE Place 1 Applicatorful vaginally at bedtime.   hydrochlorothiazide 25 MG  tablet Commonly known as:  HYDRODIURIL TAKE 1 TABLET DAILY   metoprolol tartrate 25 MG tablet Commonly known as:  LOPRESSOR Take 25 mg by mouth 2 (two) times daily.   ramipril 10 MG capsule Commonly known as:  ALTACE Take 1 capsule (10 mg total) by mouth daily.   simvastatin 10 MG tablet Commonly known as:  ZOCOR TAKE 1 TABLET DAILY   VITAMIN D HIGH POTENCY PO Take by mouth.       Allergies:  Allergies  Allergen Reactions  . [name] Diarrhea  . [name]     Family History: Family History  Problem Relation Age of Onset  . Hypertension Mother   . Colon cancer Neg Hx   . Prostate cancer Neg Hx   . Kidney cancer Neg Hx   . Bladder Cancer Neg Hx     Social History:  reports that she has never smoked. She has never used smokeless tobacco. She reports that she does not drink alcohol or use drugs.  ROS: UROLOGY Frequent Urination?: Yes Hard to postpone urination?: No Burning/pain with urination?: No Get up at night to urinate?: Yes Leakage of urine?: No Urine stream starts and stops?: No Trouble starting stream?: No Do you have to strain to urinate?: No Blood in urine?: No Urinary tract infection?: Yes Sexually transmitted disease?: No Injury to kidneys or bladder?: No Painful intercourse?: No Weak stream?: No Currently pregnant?: No Vaginal bleeding?: No Last menstrual period?: n  Gastrointestinal Nausea?: No Vomiting?: No Indigestion/heartburn?: No Diarrhea?: No Constipation?: No  Constitutional Fever: No Night sweats?: No Weight loss?: No Fatigue?: No  Skin Skin rash/lesions?: No Itching?: No  Eyes Blurred vision?: No Double vision?: No  Ears/Nose/Throat Sore throat?: No Sinus problems?: No  Hematologic/Lymphatic Swollen glands?: No Easy bruising?: No  Cardiovascular Leg swelling?: No Chest pain?: No  Respiratory Cough?: No Shortness of breath?: No  Endocrine Excessive thirst?:  No  Musculoskeletal Back pain?: No Joint pain?: No  Neurological Headaches?: No Dizziness?: No  Psychologic Depression?: No Anxiety?: No  Physical Exam: BP (!) 167/79   Pulse 83   Ht 5\' 2"  (1.575 m)   Wt 137 lb (62.1 kg)   BMI 25.06 kg/m   Constitutional: Well nourished. Alert and oriented, No acute distress. HEENT: [place] AT, moist mucus membranes. Trachea midline, no masses. Cardiovascular: No clubbing, cyanosis, or edema. Respiratory: Normal respiratory effort, no increased work of breathing. GI: Abdomen is soft, non tender, non distended, no abdominal masses. Liver and spleen not palpable.  No hernias appreciated.  Stool sample for occult testing is not indicated.   GU: No CVA tenderness.  No bladder fullness or masses.  Normal external genitalia, normal pubic hair distribution, no lesions.  Normal urethral meatus, no lesions,  no prolapse, no discharge.   No urethral masses, tenderness and/or tenderness. No bladder fullness, tenderness or masses. Normal vagina mucosa, good estrogen effect, no discharge, no lesions, good pelvic support, Grade III cystocele is noted.  No rectocele is noted.  Cervix and uterus are surgically absent.   No adnexal/parametria masses or tenderness noted.  Anus and perineum are without rashes or lesions.    Skin: No rashes, bruises or suspicious lesions. Lymph: No cervical or inguinal adenopathy. Neurologic: Grossly intact, no focal deficits, moving all 4 extremities. Psychiatric: Normal mood and affect.  Laboratory Data: Lab Results  Component Value Date   WBC 3.7 04/01/2016   HGB 14.0 03/29/2015   HCT 40.3 04/01/2016   MCV 87 04/01/2016   PLT 223 04/01/2016    Lab Results  Component Value Date   CREATININE 0.84 04/01/2016    Lab Results  Component Value Date   HGBA1C 5.6 07/23/2011    Lab Results  Component Value Date   TSH 2.389 03/29/2015       Component Value Date/Time   CHOL 187 04/01/2016 0834   HDL 71 04/01/2016 0834    CHOLHDL 2.6 04/01/2016 0834   CHOLHDL 2.1 09/25/2015 0822   VLDL 13 09/25/2015 0822   LDLCALC 95 04/01/2016 0834    Lab Results  Component Value Date   AST 19 04/01/2016   Lab Results  Component Value Date   ALT 18 04/01/2016    Assessment & Plan:    1. Recurrent UTI's  - Patient will a current UTI with Enterococcus faecalis; she is started on Cipro 250 mg bid for seven days  - Encouraged the patient to contact our office with symptoms of UTI  2. Cystocele  - Patient has had a pessary in the past and she did not find them effective and she does not want to be fitted for a another one  - Patient is tilting after urination and this is aiding in emptying her bladder  - Offered patient referral to PT, but she would like to give the vaginal cream more time  3. Vaginal atrophy  - Patient encouraged to apply the vaginal estrogen cream 3 nights weekly  - Return to clinic in 3 month for exam   Return in about 3 months (around 10/14/2016) for exam.  These notes generated with voice recognition software. I apologize for typographical errors.  Zara Council, [facility] [address] [place], [place] [address] [phone]

## 2016-08-04 DIAGNOSIS — D0471 Carcinoma in situ of skin of right lower limb, including hip: Secondary | ICD-10-CM | POA: Diagnosis not present

## 2016-08-04 DIAGNOSIS — C44722 Squamous cell carcinoma of skin of right lower limb, including hip: Secondary | ICD-10-CM | POA: Diagnosis not present

## 2016-08-07 ENCOUNTER — Other Ambulatory Visit: Payer: Self-pay

## 2016-08-07 MED ORDER — SIMVASTATIN 10 MG PO TABS: 10.0000 mg | ORAL_TABLET | Freq: Every day | ORAL | 3 refills | Status: DC

## 2016-08-07 MED ORDER — HYDROCHLOROTHIAZIDE 25 MG PO TABS: 25.0000 mg | ORAL_TABLET | Freq: Every day | ORAL | 3 refills | Status: DC

## 2016-08-11 DIAGNOSIS — C44722 Squamous cell carcinoma of skin of right lower limb, including hip: Secondary | ICD-10-CM | POA: Diagnosis not present

## 2016-09-22 DIAGNOSIS — D485 Neoplasm of uncertain behavior of skin: Secondary | ICD-10-CM | POA: Diagnosis not present

## 2016-09-22 DIAGNOSIS — Z859 Personal history of malignant neoplasm, unspecified: Secondary | ICD-10-CM | POA: Diagnosis not present

## 2016-09-22 DIAGNOSIS — L570 Actinic keratosis: Secondary | ICD-10-CM | POA: Diagnosis not present

## 2016-09-22 DIAGNOSIS — L821 Other seborrheic keratosis: Secondary | ICD-10-CM | POA: Diagnosis not present

## 2016-09-23 DIAGNOSIS — M5137 Other intervertebral disc degeneration, lumbosacral region: Secondary | ICD-10-CM | POA: Diagnosis not present

## 2016-09-23 DIAGNOSIS — M545 Low back pain: Secondary | ICD-10-CM | POA: Diagnosis not present

## 2016-09-23 DIAGNOSIS — G8929 Other chronic pain: Secondary | ICD-10-CM | POA: Diagnosis not present

## 2016-09-23 DIAGNOSIS — M5440 Lumbago with sciatica, unspecified side: Secondary | ICD-10-CM | POA: Diagnosis not present

## 2016-09-29 ENCOUNTER — Ambulatory Visit (INDEPENDENT_AMBULATORY_CARE_PROVIDER_SITE_OTHER): Payer: Medicare HMO | Admitting: Internal Medicine

## 2016-09-29 ENCOUNTER — Encounter: Payer: Self-pay | Admitting: Internal Medicine

## 2016-09-29 VITALS — BP 140/72 | HR 60 | Ht 62.0 in | Wt 139.4 lb

## 2016-09-29 DIAGNOSIS — E785 Hyperlipidemia, unspecified: Secondary | ICD-10-CM | POA: Diagnosis not present

## 2016-09-29 DIAGNOSIS — E559 Vitamin D deficiency, unspecified: Secondary | ICD-10-CM

## 2016-09-29 DIAGNOSIS — R002 Palpitations: Secondary | ICD-10-CM | POA: Diagnosis not present

## 2016-09-29 DIAGNOSIS — Z1231 Encounter for screening mammogram for malignant neoplasm of breast: Secondary | ICD-10-CM | POA: Diagnosis not present

## 2016-09-29 DIAGNOSIS — R7989 Other specified abnormal findings of blood chemistry: Secondary | ICD-10-CM | POA: Diagnosis not present

## 2016-09-29 DIAGNOSIS — M5441 Lumbago with sciatica, right side: Secondary | ICD-10-CM

## 2016-09-29 DIAGNOSIS — M5442 Lumbago with sciatica, left side: Secondary | ICD-10-CM

## 2016-09-29 DIAGNOSIS — Z0000 Encounter for general adult medical examination without abnormal findings: Secondary | ICD-10-CM

## 2016-09-29 DIAGNOSIS — I10 Essential (primary) hypertension: Secondary | ICD-10-CM | POA: Diagnosis not present

## 2016-09-29 DIAGNOSIS — R946 Abnormal results of thyroid function studies: Secondary | ICD-10-CM | POA: Diagnosis not present

## 2016-09-29 NOTE — Progress Notes (Signed)
Patient: Rebecca Gutierrez, Female    DOB: Apr 22, 1941, 76 y.o.   MRN: [id number] Visit Date: 09/29/2016  Today's Provider: Halina Maidens, MD   Chief Complaint  Patient presents with  . Medicare Wellness    Breast exam today.   Subjective:    Annual wellness visit Rebecca Gutierrez is a 76 y.o. female who presents today for her Subsequent Annual Wellness Visit. She feels fairly well. She reports exercising some. She reports she is sleeping fairly well. She is due for a mammogram and denies breast complaints.  ----------------------------------------------------------- Hypertension  Pertinent negatives include no chest pain, headaches, palpitations or shortness of breath. Past treatments include ACE inhibitors, diuretics and beta blockers. The current treatment provides significant improvement.  Back Pain  This is a recurrent (seen by Ortho and started on steroid taper) problem. The problem has been gradually improving since onset. The pain is present in the lumbar spine. Pertinent negatives include no abdominal pain, chest pain, dysuria, fever or headaches. Risk factors: known HNP   Hyperlipidemia  This is a chronic problem. The problem is controlled. Pertinent negatives include no chest pain or shortness of breath. Current antihyperlipidemic treatment includes statins. The current treatment provides significant improvement of lipids. There are no compliance problems.   Vitamin D def - on daily supplement.  Last value was borderline low normal.  Review of Systems  Constitutional: Negative for chills, fatigue and fever.  HENT: Negative for congestion, hearing loss, tinnitus, trouble swallowing and voice change.   Eyes: Negative for visual disturbance.  Respiratory: Negative for cough, chest tightness, shortness of breath and wheezing.   Cardiovascular: Negative for chest pain, palpitations and leg swelling.  Gastrointestinal: Negative for abdominal pain, constipation, diarrhea and  vomiting.  Endocrine: Negative for polydipsia and polyuria.  Genitourinary: Negative for dysuria, frequency, genital sores, vaginal bleeding and vaginal discharge.  Musculoskeletal: Positive for back pain. Negative for arthralgias, gait problem and joint swelling.  Skin: Negative for color change and rash.  Neurological: Negative for dizziness, tremors, light-headedness and headaches.  Hematological: Negative for adenopathy. Does not bruise/bleed easily.  Psychiatric/Behavioral: Negative for dysphoric mood and sleep disturbance. The patient is not nervous/anxious.     Social History   Social History  . Marital status: Unknown    Spouse name: N/A  . Number of children: N/A  . Years of education: N/A   Occupational History  . Not on file.   Social History Main Topics  . Smoking status: Never Smoker  . Smokeless tobacco: Never Used  . Alcohol use No  . Drug use: No  . Sexual activity: No   Other Topics Concern  . Not on file   Social History Narrative  . No narrative on file    Patient Active Problem List   Diagnosis Date Noted  . History of recurrent urinary tract infection 03/24/2016  . Cystocele 09/13/2013  . Palpitations 06/04/2012  . OSTEOPENIA 06/28/2009  . Hyperlipidemia LDL goal <100 06/02/2007  . 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 Hearing loss 06/02/2007  . Essential hypertension, benign 06/02/2007    Past Surgical History:  Procedure Laterality Date  . ANTERIOR AND POSTERIOR REPAIR N/A 05/02/2013   Procedure: ANTERIOR (CYSTOCELE) AND POSTERIOR REPAIR (RECTOCELE);  Surgeon: [name], MD;  Location: [facility];  Service: Gynecology;  Laterality: N/A;  . COLONOSCOPY  08/28/2011   Procedure: COLONOSCOPY;  Surgeon: [name], MD;  [facility]: [facility];  Service: Endoscopy;  Laterality: N/A;  930  . EYE  SURGERY Bilateral 02/13/2011   other 2013  . 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 TUBAL LIGATION    . VAGINAL  HYSTERECTOMY N/A 05/02/2013   Procedure: HYSTERECTOMY VAGINAL;  Surgeon: [name], MD;  Location: [facility];  Service: Gynecology;  Laterality: N/A;    Her family history includes Hypertension in her mother.     Previous Medications   ASPIRIN EC 81 MG TABLET    Take 81 mg by mouth at bedtime.    CHOLECALCIFEROL (VITAMIN D HIGH POTENCY PO)    Take by mouth.   CRANBERRY 500 MG CAPS    Take 1 capsule by mouth 2 (two) times daily.   ESTRADIOL (ESTRACE) 0.1 MG/GM VAGINAL CREAM    Place 1 Applicatorful vaginally at bedtime.   HYDROCHLOROTHIAZIDE (HYDRODIURIL) 25 MG TABLET    Take 1 tablet (25 mg total) by mouth daily.   METOPROLOL TARTRATE (LOPRESSOR) 25 MG TABLET    Take 25 mg by mouth 2 (two) times daily.     PREDNISONE (DELTASONE) 10 MG TABLET    Take 10 mg by mouth taper from 4 doses each day to 1 dose and stop.   RAMIPRIL (ALTACE) 10 MG CAPSULE    Take 1 capsule (10 mg total) by mouth daily.   SIMVASTATIN (ZOCOR) 10 MG TABLET    Take 1 tablet (10 mg total) by mouth daily.    Patient Care Team: [name], MD as PCP - General (Family Medicine) Franchot Gallo, MD as Consulting Physician (Urology)      Objective:   Vitals: BP 140/72 (BP Location: Left Arm, Patient Position: Sitting, Cuff Size: Normal)   Pulse 60   Ht 5\' 2"  (1.575 m)   Wt 139 lb 6.4 oz (63.2 kg)   SpO2 99%   BMI 25.50 kg/m   Physical Exam  Constitutional: She is oriented to person, place, and time. She appears well-developed and well-nourished. No distress.  HENT:  Head: Normocephalic and atraumatic.  Right Ear: Tympanic membrane and ear canal normal.  Left Ear: Tympanic membrane and ear canal normal.  Nose: Right sinus exhibits no maxillary sinus tenderness. Left sinus exhibits no maxillary sinus tenderness.  Mouth/Throat: Uvula is midline and oropharynx is clear and moist.  Eyes: Conjunctivae and EOM are normal. Right eye exhibits no discharge. Left eye exhibits no discharge. No scleral icterus.    Neck: Normal range of motion. Carotid bruit is not present. No erythema present. No thyromegaly present.  Cardiovascular: Normal rate, regular rhythm, normal heart sounds and normal pulses.   Pulmonary/Chest: Effort normal. No respiratory distress. She has no wheezes. Right breast exhibits no mass, no nipple discharge, no skin change and no tenderness. Left breast exhibits no mass, no nipple discharge, no skin change and no tenderness.  Abdominal: Soft. Bowel sounds are normal. There is no hepatosplenomegaly. There is no tenderness. There is no CVA tenderness.  Musculoskeletal: Normal range of motion.  Lymphadenopathy:    She has no cervical adenopathy.    She has no axillary adenopathy.  Neurological: She is alert and oriented to person, place, and time. She has normal reflexes. No cranial nerve deficit or sensory deficit.  Skin: Skin is warm, dry and intact. No rash noted.  Psychiatric: She has a normal mood and affect. Her speech is normal and behavior is normal. Thought content normal.  Nursing note and vitals reviewed.   Activities of Daily Living In your present state of health, do you have  any difficulty performing the following activities: 09/29/2016 03/24/2016  Hearing? N N  Vision? N N  Difficulty concentrating or making decisions? N N  Walking or climbing stairs? N N  Dressing or bathing? N N  Doing errands, shopping? N N  Preparing Food and eating ? N -  Using the Toilet? N -  In the past six months, have you accidently leaked urine? N -  Do you have problems with loss of bowel control? N -  Managing your Medications? N -  Managing your Finances? N -  Housekeeping or managing your Housekeeping? N -  Some recent data might be hidden    Fall Risk Assessment Fall Risk  09/29/2016 05/01/2016 04/02/2015 04/02/2015 09/17/2014  Falls in the past year? No No No No No      Depression Screen PHQ 2/9 Scores 09/29/2016 05/01/2016 04/02/2015 03/14/2014  PHQ - 2 Score 0 0 0 0  PHQ-  9 Score - - - 1    6CIT Screen 09/29/2016  What Year? 0 points  What month? 0 points  What time? 0 points  Count back from 20 0 points  Months in reverse 0 points  Repeat phrase 0 points  Total Score 0    Medicare Annual Wellness Visit Summary:  Reviewed patient's Family Medical History Reviewed and updated list of patient's medical providers Assessment of cognitive impairment was done Assessed patient's functional ability Established a written schedule for health screening [place] Completed and Reviewed  Exercise Activities and Dietary recommendations Goals    None      Immunization History  Administered Date(s) Administered  . Influenza Split 04/01/2015  . Influenza Whole 03/25/2006, 05/25/2011  . Influenza,inj,Quad PF,36+ Mos 03/01/2013, 03/14/2014, 03/24/2016  . Pneumococcal Conjugate-13 09/17/2014  . Pneumococcal Polysaccharide-23 09/23/2009  . Td 02/04/2004  . Zoster 07/17/2010    Health Maintenance  Topic Date Due  . INFLUENZA VACCINE  01/13/2017  . MAMMOGRAM  09/26/2017  . COLONOSCOPY  08/27/2021  . TETANUS/TDAP  03/14/2024  . DEXA SCAN  Completed  . PNA vac Low Risk Adult  Completed    Discussed health benefits of physical activity, and encouraged her to engage in regular exercise appropriate for her age and condition.    ------------------------------------------------------------------------------------------------------------  Assessment & Plan:  1. Medicare annual wellness visit, subsequent Measures satisfied - POCT urinalysis dipstick  2. Essential hypertension, benign controlled - CBC with Differential/Platelet - Comprehensive metabolic panel - TSH  3. Hyperlipidemia LDL goal <100 Continue statin therapy Lab Results  Component Value Date   CHOL 187 04/01/2016   HDL 71 04/01/2016   LDLCALC 95 04/01/2016   TRIG 104 04/01/2016   CHOLHDL 2.6 04/01/2016    4. Encounter for screening mammogram for breast  cancer Patient will schedule at [facility]; Future  5. Palpitations Controlled - will follow up with Cardiology  6. Acute bilateral low back pain with bilateral sciatica Improving on steroid taper  7. Mild vitamin D deficiency Continue daily supplement   No orders of the defined types were placed in this encounter.   Halina Maidens, MD [facility]  09/29/2016

## 2016-09-29 NOTE — Patient Instructions (Signed)
Health Maintenance  Topic Date Due  . INFLUENZA VACCINE  01/13/2017  . MAMMOGRAM  09/26/2017  . COLONOSCOPY  08/27/2021  . TETANUS/TDAP  03/14/2024  . DEXA SCAN  Completed  . PNA vac Low Risk Adult  Completed

## 2016-09-30 LAB — CBC WITH DIFFERENTIAL/PLATELET
Basophils Absolute: 0 10*3/uL (ref 0.0–0.2)
Basos: 0 %
EOS (ABSOLUTE): 0.1 10*3/uL (ref 0.0–0.4)
EOS: 1 %
HEMATOCRIT: 41.5 % (ref 34.0–46.6)
HEMOGLOBIN: 13.7 g/dL (ref 11.1–15.9)
Immature Grans (Abs): 0 10*3/uL (ref 0.0–0.1)
Immature Granulocytes: 0 %
LYMPHS ABS: 2.3 10*3/uL (ref 0.7–3.1)
Lymphs: 29 %
MCH: 29.7 pg (ref 26.6–33.0)
MCHC: 33 g/dL (ref 31.5–35.7)
MCV: 90 fL (ref 79–97)
MONOCYTES: 9 %
MONOS ABS: 0.7 10*3/uL (ref 0.1–0.9)
NEUTROS ABS: 4.6 10*3/uL (ref 1.4–7.0)
Neutrophils: 61 %
Platelets: 289 10*3/uL (ref 150–379)
RBC: 4.62 x10E6/uL (ref 3.77–5.28)
RDW: 14.2 % (ref 12.3–15.4)
WBC: 7.7 10*3/uL (ref 3.4–10.8)

## 2016-09-30 LAB — COMPREHENSIVE METABOLIC PANEL
A/G RATIO: 2.5 — AB (ref 1.2–2.2)
ALBUMIN: 4.7 g/dL (ref 3.5–4.8)
ALK PHOS: 62 IU/L (ref 39–117)
ALT: 21 IU/L (ref 0–32)
AST: 19 IU/L (ref 0–40)
BILIRUBIN TOTAL: 1.1 mg/dL (ref 0.0–1.2)
BUN/Creatinine Ratio: 21 (ref 12–28)
BUN: 18 mg/dL (ref 8–27)
CO2: 28 mmol/L (ref 18–29)
CREATININE: 0.87 mg/dL (ref 0.57–1.00)
Calcium: 9.9 mg/dL (ref 8.7–10.3)
Chloride: 96 mmol/L (ref 96–106)
GFR calc Af Amer: 76 mL/min/{1.73_m2} (ref >59)
GFR calc non Af Amer: 66 mL/min/{1.73_m2} (ref >59)
GLOBULIN, TOTAL: 1.9 g/dL (ref 1.5–4.5)
Glucose: 69 mg/dL (ref 65–99)
POTASSIUM: 3 mmol/L — AB (ref 3.5–5.2)
SODIUM: 141 mmol/L (ref 134–144)
Total Protein: 6.6 g/dL (ref 6.0–8.5)

## 2016-09-30 LAB — TSH: TSH: 6.18 u[IU]/mL — AB (ref 0.450–4.500)

## 2016-09-30 NOTE — Progress Notes (Signed)
Added T4 and T3 to labs. Awaiting result to call patient.

## 2016-10-01 ENCOUNTER — Ambulatory Visit
Admission: RE | Admit: 2016-10-01 | Discharge: 2016-10-01 | Disposition: A | Discharge status: Home / Self Care | Payer: Medicare HMO | Source: Ambulatory Visit | Attending: Internal Medicine | Admitting: Internal Medicine

## 2016-10-01 DIAGNOSIS — Z1231 Encounter for screening mammogram for malignant neoplasm of breast: Secondary | ICD-10-CM | POA: Diagnosis not present

## 2016-10-01 HISTORY — DX: Malignant (primary) neoplasm, unspecified: C80.1

## 2016-10-05 ENCOUNTER — Other Ambulatory Visit: Payer: Self-pay | Admitting: Internal Medicine

## 2016-10-05 ENCOUNTER — Telehealth: Payer: Self-pay

## 2016-10-05 MED ORDER — METOPROLOL TARTRATE 25 MG PO TABS: 25.0000 mg | ORAL_TABLET | Freq: Two times a day (BID) | ORAL | 3 refills | Status: DC

## 2016-10-05 NOTE — Telephone Encounter (Signed)
Pt called requesting refill on Metoprolol [facility]

## 2016-10-07 LAB — T3: T3, Total: 106 ng/dL (ref 71–180)

## 2016-10-07 LAB — T4: T4 TOTAL: 7.3 ug/dL (ref 4.5–12.0)

## 2016-10-07 LAB — SPECIMEN STATUS REPORT

## 2016-10-08 DIAGNOSIS — M545 Low back pain: Secondary | ICD-10-CM | POA: Diagnosis not present

## 2016-10-08 DIAGNOSIS — M5440 Lumbago with sciatica, unspecified side: Secondary | ICD-10-CM | POA: Diagnosis not present

## 2016-10-08 DIAGNOSIS — M5137 Other intervertebral disc degeneration, lumbosacral region: Secondary | ICD-10-CM | POA: Diagnosis not present

## 2016-10-08 DIAGNOSIS — G8929 Other chronic pain: Secondary | ICD-10-CM | POA: Diagnosis not present

## 2016-10-16 ENCOUNTER — Ambulatory Visit: Payer: Medicare HMO | Admitting: Urology

## 2016-10-19 DIAGNOSIS — M6281 Muscle weakness (generalized): Secondary | ICD-10-CM | POA: Diagnosis not present

## 2016-10-19 DIAGNOSIS — G8929 Other chronic pain: Secondary | ICD-10-CM | POA: Diagnosis not present

## 2016-10-19 DIAGNOSIS — M5442 Lumbago with sciatica, left side: Secondary | ICD-10-CM | POA: Diagnosis not present

## 2016-10-27 DIAGNOSIS — G8929 Other chronic pain: Secondary | ICD-10-CM | POA: Diagnosis not present

## 2016-10-27 DIAGNOSIS — M6281 Muscle weakness (generalized): Secondary | ICD-10-CM | POA: Diagnosis not present

## 2016-10-27 DIAGNOSIS — M5442 Lumbago with sciatica, left side: Secondary | ICD-10-CM | POA: Diagnosis not present

## 2016-10-29 DIAGNOSIS — M5440 Lumbago with sciatica, unspecified side: Secondary | ICD-10-CM | POA: Diagnosis not present

## 2016-10-29 DIAGNOSIS — M5137 Other intervertebral disc degeneration, lumbosacral region: Secondary | ICD-10-CM | POA: Diagnosis not present

## 2016-11-06 ENCOUNTER — Encounter: Payer: Self-pay | Admitting: Urology

## 2016-11-06 ENCOUNTER — Ambulatory Visit (INDEPENDENT_AMBULATORY_CARE_PROVIDER_SITE_OTHER): Payer: Medicare HMO | Admitting: Urology

## 2016-11-06 VITALS — BP 144/72 | HR 81 | Ht 62.0 in | Wt 139.0 lb

## 2016-11-06 DIAGNOSIS — N390 Urinary tract infection, site not specified: Secondary | ICD-10-CM | POA: Diagnosis not present

## 2016-11-06 DIAGNOSIS — N8111 Cystocele, midline: Secondary | ICD-10-CM

## 2016-11-06 DIAGNOSIS — N952 Postmenopausal atrophic vaginitis: Secondary | ICD-10-CM

## 2016-11-15 NOTE — Progress Notes (Signed)
11/06/2016 3:27 PM   Rebecca Gutierrez 1940/12/18 [id number]  Referring provider: [name], MD [address] [place], [place] [address]  Chief Complaint  Patient presents with  . Recurrent UTI    70month follow up    HPI: 75 year old female with a history of recurrent urinary tract infections, large cystocele, and vaginal hypertrophy returns today for follow-up pelvic exam per her Zara Council.  She reports that since her last visit, she had no further urinary tract infections. Overall she is doing quite well.  She is been using her topical vaginal estrogen cream without difficulty. She states that she has no vaginal dryness or irritation.  Terms of her large cystocele, she's been positioning herself while voiding and ordered today. She satisfied with this.   PMH: Past Medical History:  Diagnosis Date  . Anxiety   . Cancer ([place])    skin ca  . Gilbert's syndrome   . Hearing loss 1975  . Hyperlipidemia 2000  . Hypertension 2000    Surgical History: Past Surgical History:  Procedure Laterality Date  . ABDOMINAL HYSTERECTOMY    . ANTERIOR AND POSTERIOR REPAIR N/A 05/02/2013   Procedure: ANTERIOR (CYSTOCELE) AND POSTERIOR REPAIR (RECTOCELE);  Surgeon: [name], MD;  Location: [facility];  Service: Gynecology;  Laterality: N/A;  . COLONOSCOPY  08/28/2011   Procedure: COLONOSCOPY;  Surgeon: [name], MD;  [facility];  Service: Endoscopy;  Laterality: N/A;  930  . EYE SURGERY Bilateral 02/13/2011   other 2013  . 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 TUBAL LIGATION    . VAGINAL HYSTERECTOMY N/A 05/02/2013   Procedure: HYSTERECTOMY VAGINAL;  Surgeon: [name], MD;  Location: [facility];  Service: Gynecology;  Laterality: N/A;    Home Medications:  Allergies as of 11/06/2016      Reactions   [name] Diarrhea   [name]       Medication List       Accurate as of  11/06/16 11:59 PM. Always use your most recent med list.          aspirin EC 81 MG tablet Take 81 mg by mouth at bedtime.   Cranberry 500 MG Caps Take 1 capsule by mouth 2 (two) times daily.   estradiol 0.1 MG/GM vaginal cream Commonly known as:  ESTRACE Place 1 Applicatorful vaginally at bedtime.   hydrochlorothiazide 25 MG tablet Commonly known as:  HYDRODIURIL Take 1 tablet (25 mg total) by mouth daily.   metoprolol tartrate 25 MG tablet Commonly known as:  LOPRESSOR Take 1 tablet (25 mg total) by mouth 2 (two) times daily.   predniSONE 10 MG tablet Commonly known as:  DELTASONE Take 10 mg by mouth taper from 4 doses each day to 1 dose and stop.   ramipril 10 MG capsule Commonly known as:  ALTACE Take 1 capsule (10 mg total) by mouth daily.   simvastatin 10 MG tablet Commonly known as:  ZOCOR Take 1 tablet (10 mg total) by mouth daily.   VITAMIN D HIGH POTENCY PO Take by mouth.       Allergies:  Allergies  Allergen Reactions  . [name] Diarrhea  . [name]     Family History: Family History  Problem Relation Age of Onset  . Hypertension Mother   . Colon cancer Neg Hx   . Prostate cancer Neg Hx   . Kidney cancer Neg Hx   . Bladder  Cancer Neg Hx   . Breast cancer Neg Hx     Social History:  reports that she has never smoked. She has never used smokeless tobacco. She reports that she does not drink alcohol or use drugs.  ROS: UROLOGY Frequent Urination?: No Hard to postpone urination?: No Burning/pain with urination?: No Get up at night to urinate?: No Leakage of urine?: No Urine stream starts and stops?: No Trouble starting stream?: No Do you have to strain to urinate?: No Blood in urine?: No Urinary tract infection?: No Sexually transmitted disease?: No Injury to kidneys or bladder?: No Painful intercourse?: No Weak stream?: No Currently pregnant?: No Vaginal bleeding?: No Last menstrual period?:  n  Gastrointestinal Nausea?: No Vomiting?: No Indigestion/heartburn?: No Diarrhea?: No Constipation?: No  Constitutional Fever: No Night sweats?: No Weight loss?: No Fatigue?: No  Skin Skin rash/lesions?: No Itching?: No  Eyes Blurred vision?: No Double vision?: No  Ears/Nose/Throat Sore throat?: No Sinus problems?: No  Hematologic/Lymphatic Swollen glands?: No Easy bruising?: Yes  Cardiovascular Leg swelling?: No Chest pain?: No  Respiratory Cough?: No Shortness of breath?: No  Endocrine Excessive thirst?: No  Musculoskeletal Back pain?: No Joint pain?: No  Neurological Headaches?: No Dizziness?: No  Psychologic Depression?: No Anxiety?: No  Physical Exam: BP (!) 144/72   Pulse 81   Ht 5\' 2"  (1.575 m)   Wt 139 lb (63 kg)   BMI 25.42 kg/m   Constitutional:  Alert and oriented, No acute distress. HEENT: [place] AT, moist mucus membranes.  Trachea midline, no masses. Cardiovascular: No clubbing, cyanosis, or edema. Respiratory: Normal respiratory effort, no increased work of breathing. GI: Abdomen is soft, nontender, nondistended, no abdominal masses GU: Normal external genitalia. Persistently atrophic vaginal atrophy which is overall mild. Large stage II cystocele just past the introitus with Valsalva. Skin: No rashes, bruises or suspicious lesions. Lymph: No cervical or inguinal adenopathy. Neurologic: Grossly intact, no focal deficits, moving all 4 extremities. Psychiatric: Normal mood and affect.  Laboratory Data: Lab Results  Component Value Date   WBC 7.7 09/29/2016   HGB 14.0 03/29/2015   HCT 41.5 09/29/2016   MCV 90 09/29/2016   PLT 289 09/29/2016    Lab Results  Component Value Date   CREATININE 0.87 09/29/2016     Lab Results  Component Value Date   HGBA1C 5.6 07/23/2011    Urinalysis N/a  Pertinent Imaging: n/a  Assessment & Plan:    1. Recurrent UTI No symptomatic UTIs since last visit Discussed strategies for  UTI prevention including hygiene issues, cranberry tablets, and primarily use of topical as she drinks cream which she has been compliant with Advised to call if she has signs or symptoms of infection  2. Cystocele, midline Mildly elevated postvoid residual last visit, new burning with double voiding in order to optimize bladder drainage Not interested in pessary or surgical intervention at this time  3. Vaginal atrophy Improving, continue topical Ashing cream 3 times a week  Return in about 1 year (around 11/06/2017) for recheck recurrent UTI.  Hollice Espy, MD  [facility] [address] [place], [place] [address] [phone]

## 2016-11-24 ENCOUNTER — Ambulatory Visit (INDEPENDENT_AMBULATORY_CARE_PROVIDER_SITE_OTHER): Payer: Medicare HMO

## 2016-11-24 VITALS — BP 157/78 | HR 69 | Ht 62.0 in | Wt 137.9 lb

## 2016-11-24 DIAGNOSIS — N390 Urinary tract infection, site not specified: Secondary | ICD-10-CM

## 2016-11-24 LAB — URINALYSIS, COMPLETE
Bilirubin, UA: NEGATIVE
GLUCOSE, UA: NEGATIVE
Ketones, UA: NEGATIVE
Nitrite, UA: NEGATIVE
Protein, UA: NEGATIVE
Specific Gravity, UA: <1.005 — ABNORMAL LOW (ref 1.005–1.030)
UUROB: 0.2 mg/dL (ref 0.2–1.0)
pH, UA: 6 (ref 5.0–7.5)

## 2016-11-24 LAB — MICROSCOPIC EXAMINATION
RBC, UA: NONE SEEN /hpf (ref 0–?)
WBC, UA: >30 /hpf — ABNORMAL HIGH (ref 0–?)

## 2016-11-24 MED ORDER — CIPROFLOXACIN HCL 500 MG PO TABS: 500.0000 mg | ORAL_TABLET | Freq: Two times a day (BID) | ORAL | 0 refills | Status: DC

## 2016-11-24 NOTE — Progress Notes (Signed)
Patient present today with c/o: urinary frequency, hard to postpone urination, burning/painful urination, urinary urgency. Patient provided urine sample for urinalysis. Bladder scan performed.  Bladder Scan Patient can void: 166 ml  Urine sent for culture. Patient given Cipro 500mg  BID x7 days per Dr. Erlene Quan.  Verified pharmacy. Sent Rx.   Vitals:   11/24/16 1415  BP: (!) 157/78  Pulse: 69  Weight: 137 lb 14.4 oz (62.6 kg)  Height: 5\' 2"  (1.575 m)

## 2016-11-24 NOTE — Addendum Note (Signed)
Addended by: Leia Alf on: 11/24/2016 04:12 PM   Modules accepted: Orders

## 2016-11-25 ENCOUNTER — Other Ambulatory Visit: Payer: Self-pay | Admitting: Orthopedic Surgery

## 2016-11-25 DIAGNOSIS — M545 Low back pain, unspecified: Secondary | ICD-10-CM

## 2016-11-25 DIAGNOSIS — G8929 Other chronic pain: Secondary | ICD-10-CM | POA: Diagnosis not present

## 2016-11-25 DIAGNOSIS — M5137 Other intervertebral disc degeneration, lumbosacral region: Secondary | ICD-10-CM

## 2016-11-25 DIAGNOSIS — M5440 Lumbago with sciatica, unspecified side: Principal | ICD-10-CM

## 2016-11-26 ENCOUNTER — Telehealth: Payer: Self-pay

## 2016-11-26 LAB — URINE CULTURE

## 2016-11-26 NOTE — Telephone Encounter (Signed)
-----   Message from Hollice Espy, MD sent at 11/26/2016  9:59 AM EDT ----- If this patient is still having symptoms of UTI, we should change her abx to ampicillin 500 mg 4 times a day 7 days. If she is improving, continue antibiotic course. She grew group B strep which sometimes is a contaminant but occasionally causes symptoms.    Hollice Espy, MD

## 2016-11-26 NOTE — Telephone Encounter (Signed)
Spoke to patient. She says she is doing great. No symptoms. Urine clear. Explained reason for calling and encouraged pt to continue to take Cipro. Patient verbalized understanding.

## 2016-12-08 ENCOUNTER — Ambulatory Visit
Admission: RE | Admit: 2016-12-08 | Discharge: 2016-12-08 | Disposition: A | Discharge status: Home / Self Care | Payer: Medicare HMO | Source: Ambulatory Visit | Attending: Orthopedic Surgery | Admitting: Orthopedic Surgery

## 2016-12-08 DIAGNOSIS — M5440 Lumbago with sciatica, unspecified side: Secondary | ICD-10-CM | POA: Insufficient documentation

## 2016-12-08 DIAGNOSIS — M5137 Other intervertebral disc degeneration, lumbosacral region: Secondary | ICD-10-CM | POA: Insufficient documentation

## 2016-12-08 DIAGNOSIS — M545 Low back pain, unspecified: Secondary | ICD-10-CM

## 2016-12-08 DIAGNOSIS — M47896 Other spondylosis, lumbar region: Secondary | ICD-10-CM | POA: Diagnosis not present

## 2016-12-08 DIAGNOSIS — G8929 Other chronic pain: Secondary | ICD-10-CM

## 2016-12-28 DIAGNOSIS — L03032 Cellulitis of left toe: Secondary | ICD-10-CM | POA: Diagnosis not present

## 2017-01-06 DIAGNOSIS — D239 Other benign neoplasm of skin, unspecified: Secondary | ICD-10-CM | POA: Diagnosis not present

## 2017-01-18 DIAGNOSIS — L03032 Cellulitis of left toe: Secondary | ICD-10-CM | POA: Diagnosis not present

## 2017-02-16 DIAGNOSIS — M5136 Other intervertebral disc degeneration, lumbar region: Secondary | ICD-10-CM | POA: Diagnosis not present

## 2017-02-16 DIAGNOSIS — M5416 Radiculopathy, lumbar region: Secondary | ICD-10-CM | POA: Diagnosis not present

## 2017-02-22 ENCOUNTER — Ambulatory Visit (INDEPENDENT_AMBULATORY_CARE_PROVIDER_SITE_OTHER): Payer: Medicare HMO | Admitting: Internal Medicine

## 2017-02-22 ENCOUNTER — Encounter: Payer: Self-pay | Admitting: Internal Medicine

## 2017-02-22 VITALS — BP 140/80 | HR 52 | Ht 62.0 in | Wt 139.0 lb

## 2017-02-22 DIAGNOSIS — I10 Essential (primary) hypertension: Secondary | ICD-10-CM

## 2017-02-22 DIAGNOSIS — R002 Palpitations: Secondary | ICD-10-CM

## 2017-02-22 MED ORDER — METOPROLOL TARTRATE 25 MG PO TABS: 37.5000 mg | ORAL_TABLET | Freq: Two times a day (BID) | ORAL | 3 refills | Status: DC

## 2017-02-22 NOTE — Progress Notes (Signed)
Date:  02/22/2017   Name:  Rebecca Gutierrez   DOB:  [birth year]   MRN:  [id number]   Chief Complaint: Tachycardia (Pt states she feels a flutter in her heart- happening since 1 month ago. Laying down at night can feel it more. States this normally doesn't happen unless she has a anxiety attack. BP- running good at home. Highest heart rate has been 86 at home.  )  Palpitations   This is a recurrent problem. The current episode started more than 1 year ago (noted in 2013). The problem occurs 2 to 4 times per day. The problem has been gradually worsening. On average, each episode lasts 10 seconds. Nothing aggravates the symptoms. Associated symptoms include an irregular heartbeat. Pertinent negatives include no chest fullness, chest pain, diaphoresis, dizziness, fever, shortness of breath or syncope. She has tried beta blockers for the symptoms.  Also seen for similar in April.  Metoprolol dose increased to 37.5 mg bid but not continued.  She has a cardiologist in [place] who sees her once a year. Last visit her potassium was slightly low and she took supplements for a month.  Review of Systems  Constitutional: Negative for chills, diaphoresis and fever.  Respiratory: Negative for chest tightness, shortness of breath and wheezing.   Cardiovascular: Positive for palpitations. Negative for chest pain, leg swelling and syncope.  Gastrointestinal: Negative for abdominal pain.  Neurological: Negative for dizziness and headaches.    Patient Active Problem List   Diagnosis Date Noted  . History of recurrent urinary tract infection 03/24/2016  . Cystocele 09/13/2013  . Palpitations 06/04/2012  . OSTEOPENIA 06/28/2009  . Hyperlipidemia LDL goal <100 06/02/2007  . 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 Hearing loss 06/02/2007  . Essential hypertension, benign 06/02/2007  . Acute bilateral low back pain with bilateral sciatica 06/02/2007    Prior to Admission medications   Medication  Sig Start Date End Date Taking? Authorizing Provider  aspirin EC 81 MG tablet Take 81 mg by mouth at bedtime.    Yes [provider]  Cholecalciferol (VITAMIN D HIGH POTENCY PO) Take by mouth.   Yes [provider]  estradiol (ESTRACE) 0.1 MG/GM vaginal cream Place 1 Applicatorful vaginally at bedtime.   Yes [provider]  hydrochlorothiazide (HYDRODIURIL) 25 MG tablet Take 1 tablet (25 mg total) by mouth daily. 08/07/16  Yes [name], MD  metoprolol tartrate (LOPRESSOR) 25 MG tablet Take 1 tablet (25 mg total) by mouth 2 (two) times daily. 10/05/16  Yes [name], MD  ramipril (ALTACE) 10 MG capsule Take 1 capsule (10 mg total) by mouth daily. 07/06/16  Yes [name], MD  simvastatin (ZOCOR) 10 MG tablet Take 1 tablet (10 mg total) by mouth daily. 08/07/16  Yes [name], MD    Allergies  Allergen Reactions  . [name] Diarrhea  . [name]     Past Surgical History:  Procedure Laterality Date  . ABDOMINAL HYSTERECTOMY    . ANTERIOR AND POSTERIOR REPAIR N/A 05/02/2013   Procedure: ANTERIOR (CYSTOCELE) AND POSTERIOR REPAIR (RECTOCELE);  Surgeon: [name], MD;  Location: [facility];  Service: Gynecology;  Laterality: N/A;  . COLONOSCOPY  08/28/2011   Procedure: COLONOSCOPY;  Surgeon: [name], MD;  [facility];  Service: Endoscopy;  Laterality: N/A;  930  . EYE SURGERY Bilateral 02/13/2011   other 2013  . 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  TUBAL LIGATION    . VAGINAL HYSTERECTOMY N/A 05/02/2013   Procedure: HYSTERECTOMY VAGINAL;  Surgeon: [name], MD;  Location: [facility];  Service: Gynecology;  Laterality: N/A;    Social History  Substance Use Topics  . Smoking status: Never Smoker  . Smokeless tobacco: Never Used  . Alcohol use No     Medication list has been reviewed and updated.  PHQ 2/9 Scores 09/29/2016 05/01/2016 04/02/2015 03/14/2014    PHQ - 2 Score 0 0 0 0  PHQ- 9 Score - - - 1    Physical Exam  Constitutional: She is oriented to person, place, and time. She appears well-developed. No distress.  HENT:  Head: Normocephalic and atraumatic.  Cardiovascular: Normal rate, regular rhythm, normal heart sounds and intact distal pulses.  Frequent extrasystoles are present.  No murmur heard. Pulmonary/Chest: Effort normal. No respiratory distress.  Neurological: She is alert and oriented to person, place, and time.  Skin: Skin is warm and dry. No rash noted.  Psychiatric: She has a normal mood and affect. Her behavior is normal. Thought content normal.  Nursing note and vitals reviewed.   BP 140/80   Pulse (!) 52   Ht 5\' 2"  (1.575 m)   Wt 139 lb (63 kg)   SpO2 98%   BMI 25.42 kg/m   Assessment and Plan: 1. Palpitations Patient reassured Increase metoprolol dose - EKG 12-Lead - SR @ 68; freq unifocal PVCs - Basic metabolic panel - Magnesium - metoprolol tartrate (LOPRESSOR) 25 MG tablet; Take 1.5 tablets (37.5 mg total) by mouth 2 (two) times daily.  Dispense: 270 tablet; Refill: 3  2. Essential hypertension, benign controlled - metoprolol tartrate (LOPRESSOR) 25 MG tablet; Take 1.5 tablets (37.5 mg total) by mouth 2 (two) times daily.  Dispense: 270 tablet; Refill: 3   Meds ordered this encounter  Medications  . metoprolol tartrate (LOPRESSOR) 25 MG tablet    Sig: Take 1.5 tablets (37.5 mg total) by mouth 2 (two) times daily.    Dispense:  270 tablet    Refill:  3    Partially dictated using Editor, commissioning. Any errors are unintentional.  Halina Maidens, MD [facility]  02/22/2017

## 2017-02-22 NOTE — Patient Instructions (Signed)
Increase metoprolol to 1 and 1/2 tablets twice a day.  New prescription has been sent to [facility].

## 2017-02-23 LAB — BASIC METABOLIC PANEL
BUN/Creatinine Ratio: 21 (ref 12–28)
BUN: 17 mg/dL (ref 8–27)
CALCIUM: 9.5 mg/dL (ref 8.7–10.3)
CO2: 26 mmol/L (ref 20–29)
CREATININE: 0.8 mg/dL (ref 0.57–1.00)
Chloride: 97 mmol/L (ref 96–106)
GFR calc Af Amer: 84 mL/min/{1.73_m2} (ref >59)
GFR, EST NON AFRICAN AMERICAN: 73 mL/min/{1.73_m2} (ref >59)
Glucose: 96 mg/dL (ref 65–99)
Potassium: 3.6 mmol/L (ref 3.5–5.2)
SODIUM: 139 mmol/L (ref 134–144)

## 2017-02-23 LAB — MAGNESIUM: Magnesium: 2 mg/dL (ref 1.6–2.3)

## 2017-03-04 ENCOUNTER — Ambulatory Visit (INDEPENDENT_AMBULATORY_CARE_PROVIDER_SITE_OTHER): Payer: Medicare HMO

## 2017-03-04 VITALS — BP 155/75 | HR 87

## 2017-03-04 DIAGNOSIS — R300 Dysuria: Secondary | ICD-10-CM

## 2017-03-04 DIAGNOSIS — R350 Frequency of micturition: Secondary | ICD-10-CM

## 2017-03-04 LAB — MICROSCOPIC EXAMINATION
Epithelial Cells (non renal): NONE SEEN /hpf (ref 0–10)
RBC, UA: NONE SEEN /hpf (ref 0–?)

## 2017-03-04 LAB — URINALYSIS, COMPLETE
BILIRUBIN UA: NEGATIVE
Glucose, UA: NEGATIVE
Ketones, UA: NEGATIVE
NITRITE UA: NEGATIVE
PH UA: 6 (ref 5.0–7.5)
Specific Gravity, UA: 1.015 (ref 1.005–1.030)
Urobilinogen, Ur: 0.2 mg/dL (ref 0.2–1.0)

## 2017-03-04 MED ORDER — CEPHALEXIN 500 MG PO CAPS: 500.0000 mg | ORAL_CAPSULE | Freq: Four times a day (QID) | ORAL | 0 refills | Status: DC

## 2017-03-04 NOTE — Progress Notes (Signed)
Patient present today complaining of urinary frequency and urgency and dysuria.    Per Dr. Erlene Quan patient was notified that UA today was suspicious for infection we will send for culture.  Script was sent to her local pharm. Patient verbalized understanding

## 2017-03-05 ENCOUNTER — Other Ambulatory Visit: Payer: Self-pay

## 2017-03-05 ENCOUNTER — Telehealth: Payer: Self-pay

## 2017-03-05 DIAGNOSIS — R300 Dysuria: Secondary | ICD-10-CM

## 2017-03-05 DIAGNOSIS — R350 Frequency of micturition: Secondary | ICD-10-CM

## 2017-03-05 MED ORDER — CEPHALEXIN 500 MG PO CAPS: 500.0000 mg | ORAL_CAPSULE | Freq: Four times a day (QID) | ORAL | 0 refills | Status: DC

## 2017-03-05 NOTE — Telephone Encounter (Signed)
Pt called after hours triage line stating that her abx was not at the pharmacy. Pt stated that she told the nurse not to [facility] as she wouldn't get it for a week. Pt voiced frustration as she is very uncomfortable. Pt stated she was able to pick up abx right before [facility] closed last night. Reinforced with pt ucx results will be back on Monday and will call if need to change abx. Pt voiced understanding.

## 2017-03-08 LAB — CULTURE, URINE COMPREHENSIVE

## 2017-03-09 ENCOUNTER — Telehealth: Payer: Self-pay

## 2017-03-09 NOTE — Telephone Encounter (Signed)
-----   Message from Hollice Espy, MD sent at 03/08/2017  2:57 PM EDT ----- Urine culture grew beta hemolytic strep which sometimes can be a contaminant but also can be a pathogen. It should be sensitive to the Flex. If her symptoms fail to resolve, I would strongly recommend returning for a catheterized specimen.  Hollice Espy, MD

## 2017-03-10 NOTE — Telephone Encounter (Signed)
Pt called and I read message from Anon Raices.  She will finish meds, and if not better come in for cath specimen.

## 2017-03-30 DIAGNOSIS — D229 Melanocytic nevi, unspecified: Secondary | ICD-10-CM | POA: Diagnosis not present

## 2017-03-30 DIAGNOSIS — D485 Neoplasm of uncertain behavior of skin: Secondary | ICD-10-CM | POA: Diagnosis not present

## 2017-03-30 DIAGNOSIS — L570 Actinic keratosis: Secondary | ICD-10-CM | POA: Diagnosis not present

## 2017-03-30 DIAGNOSIS — L578 Other skin changes due to chronic exposure to nonionizing radiation: Secondary | ICD-10-CM | POA: Diagnosis not present

## 2017-03-30 DIAGNOSIS — Z859 Personal history of malignant neoplasm, unspecified: Secondary | ICD-10-CM | POA: Diagnosis not present

## 2017-03-31 ENCOUNTER — Encounter: Payer: Self-pay | Admitting: Internal Medicine

## 2017-03-31 ENCOUNTER — Ambulatory Visit (INDEPENDENT_AMBULATORY_CARE_PROVIDER_SITE_OTHER): Payer: Medicare HMO | Admitting: Internal Medicine

## 2017-03-31 VITALS — BP 124/80 | HR 51 | Ht 62.0 in | Wt 140.0 lb

## 2017-03-31 DIAGNOSIS — I10 Essential (primary) hypertension: Secondary | ICD-10-CM

## 2017-03-31 DIAGNOSIS — R002 Palpitations: Secondary | ICD-10-CM | POA: Diagnosis not present

## 2017-03-31 DIAGNOSIS — Z23 Encounter for immunization: Secondary | ICD-10-CM

## 2017-03-31 NOTE — Progress Notes (Signed)
Date:  03/31/2017   Name:  Rebecca Gutierrez   DOB:  Sep 04, 1940   MRN:  [id number]   Chief Complaint: Hypertension and Immunizations (High Dose Flu- ) Hypertension  This is a chronic problem. The problem has been gradually improving since onset. The problem is controlled. Associated symptoms include palpitations. Pertinent negatives include no chest pain, headaches, peripheral edema or shortness of breath. Past treatments include beta blockers, diuretics and ACE inhibitors. The current treatment provides significant improvement.   Palpitations - she is not frequently bothered by palpitations since increasing the dose of metoprolol. She denies dizziness headaches, lightheadedness, or chest pains. She has not been walking for exercise but plans to start in the near future.  Low back pain - she struggled earlier this year with a mild herniated disc in the lumbar region. She was treated conservatively with physical therapy. She previously had some radicular symptoms but these have resolved. For now she is  managing without surgical intervention.   Review of Systems  Constitutional: Negative for chills, fatigue and fever.  HENT: Positive for postnasal drip. Negative for ear discharge, sore throat and trouble swallowing.   Respiratory: Negative for chest tightness, shortness of breath and wheezing.   Cardiovascular: Positive for palpitations. Negative for chest pain and leg swelling.  Gastrointestinal: Negative for abdominal pain.  Genitourinary: Positive for frequency.  Musculoskeletal: Positive for back pain (stiffness).  Skin: Positive for color change.  Neurological: Negative for dizziness, speech difficulty and headaches.  Psychiatric/Behavioral: Negative for sleep disturbance.    Patient Active Problem List   Diagnosis Date Noted  . History of recurrent urinary tract infection 03/24/2016  . Cystocele 09/13/2013  . Palpitations 06/04/2012  . OSTEOPENIA 06/28/2009  .  Hyperlipidemia LDL goal <100 06/02/2007  . 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 Hearing loss 06/02/2007  . Essential hypertension, benign 06/02/2007  . Acute bilateral low back pain with bilateral sciatica 06/02/2007    Prior to Admission medications   Medication Sig Start Date End Date Taking? Authorizing Provider  aspirin EC 81 MG tablet Take 81 mg by mouth at bedtime.    Yes [provider]  Cholecalciferol (VITAMIN D HIGH POTENCY PO) Take by mouth.   Yes [provider]  estradiol (ESTRACE) 0.1 MG/GM vaginal cream Place 1 Applicatorful vaginally at bedtime.   Yes [provider]  hydrochlorothiazide (HYDRODIURIL) 25 MG tablet Take 1 tablet (25 mg total) by mouth daily. 08/07/16  Yes [name], MD  metoprolol tartrate (LOPRESSOR) 25 MG tablet Take 1.5 tablets (37.5 mg total) by mouth 2 (two) times daily. 02/22/17  Yes [name], MD  ramipril (ALTACE) 10 MG capsule Take 1 capsule (10 mg total) by mouth daily. 07/06/16  Yes [name], MD  simvastatin (ZOCOR) 10 MG tablet Take 1 tablet (10 mg total) by mouth daily. 08/07/16  Yes [name], MD    Allergies  Allergen Reactions  . [name] Diarrhea  . [name]     Past Surgical History:  Procedure Laterality Date  . ABDOMINAL HYSTERECTOMY    . ANTERIOR AND POSTERIOR REPAIR N/A 05/02/2013   Procedure: ANTERIOR (CYSTOCELE) AND POSTERIOR REPAIR (RECTOCELE);  Surgeon: [name], MD;  Location: [facility];  Service: Gynecology;  Laterality: N/A;  . COLONOSCOPY  08/28/2011   Procedure: COLONOSCOPY;  Surgeon: [name], MD;  Location: [facility];  Service: Endoscopy;  Laterality: N/A;  930  . EYE SURGERY Bilateral 02/13/2011   other 2013  .  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 TUBAL LIGATION    . VAGINAL HYSTERECTOMY N/A 05/02/2013   Procedure: HYSTERECTOMY VAGINAL;  Surgeon: [name], MD;  Location: [facility];   Service: Gynecology;  Laterality: N/A;    Social History  Substance Use Topics  . Smoking status: Never Smoker  . Smokeless tobacco: Never Used  . Alcohol use No     Medication list has been reviewed and updated.  PHQ 2/9 Scores 09/29/2016 05/01/2016 04/02/2015 03/14/2014  PHQ - 2 Score 0 0 0 0  PHQ- 9 Score - - - 1    Physical Exam  Constitutional: She is oriented to person, place, and time. She appears well-developed. No distress.  HENT:  Head: Normocephalic and atraumatic.  Neck: Normal range of motion. Neck supple. No thyromegaly present.  Cardiovascular: Normal rate, regular rhythm, normal heart sounds and intact distal pulses.  Frequent extrasystoles are present. Exam reveals no gallop.   No murmur heard. Pulmonary/Chest: Effort normal and breath sounds normal. No respiratory distress. She has no wheezes.  Musculoskeletal: She exhibits no edema.  Neurological: She is alert and oriented to person, place, and time.  Skin: Skin is warm and dry. No rash noted.  Psychiatric: She has a normal mood and affect. Her behavior is normal. Thought content normal.  Nursing note and vitals reviewed.   BP 124/80   Pulse (!) 51   Ht 5\' 2"  (1.575 m)   Wt 140 lb (63.5 kg)   SpO2 97%   BMI 25.61 kg/m   Assessment and Plan: 1. Essential hypertension, benign controlled  2. Palpitations Stable with minimal sx - continue metoprolol  3. Need for influenza vaccination - Flu vaccine HIGH DOSE PF   No orders of the defined types were placed in this encounter.   Partially dictated using Editor, commissioning. Any errors are unintentional.  Halina Maidens, MD [facility]  03/31/2017

## 2017-04-06 DIAGNOSIS — D485 Neoplasm of uncertain behavior of skin: Secondary | ICD-10-CM | POA: Diagnosis not present

## 2017-04-12 DIAGNOSIS — M79671 Pain in right foot: Secondary | ICD-10-CM | POA: Diagnosis not present

## 2017-04-12 DIAGNOSIS — B351 Tinea unguium: Secondary | ICD-10-CM | POA: Diagnosis not present

## 2017-04-19 ENCOUNTER — Other Ambulatory Visit: Payer: Self-pay

## 2017-04-19 MED ORDER — SIMVASTATIN 10 MG PO TABS: 10.0000 mg | ORAL_TABLET | Freq: Every day | ORAL | 1 refills | Status: DC

## 2017-05-11 ENCOUNTER — Encounter: Payer: Self-pay | Admitting: Internal Medicine

## 2017-05-11 ENCOUNTER — Ambulatory Visit: Payer: Medicare HMO | Admitting: Internal Medicine

## 2017-05-11 VITALS — BP 142/70 | HR 67 | Temp 98.2°F | Ht 62.0 in | Wt 140.0 lb

## 2017-05-11 DIAGNOSIS — J0100 Acute maxillary sinusitis, unspecified: Secondary | ICD-10-CM | POA: Diagnosis not present

## 2017-05-11 MED ORDER — CLARITHROMYCIN 500 MG PO TABS: 500.0000 mg | ORAL_TABLET | Freq: Two times a day (BID) | ORAL | 0 refills | Status: AC

## 2017-05-11 NOTE — Progress Notes (Signed)
Date:  05/11/2017   Name:  Rebecca Gutierrez   DOB:  [birth year]   MRN:  [id number]   Chief Complaint: Sinusitis (Thick green mucous- nasal drip with cold. No core throat. Can feel mucous in throat when wakes up in morning. Started yesterday. ) Sinusitis  This is a new problem. The current episode started yesterday. The problem has been gradually worsening since onset. There has been no fever. She is experiencing no pain. Associated symptoms include congestion, sinus pressure and a sore throat. Pertinent negatives include no coughing, ear pain, hoarse voice, shortness of breath or swollen glands.      Review of Systems  HENT: Positive for congestion, sinus pressure and sore throat. Negative for ear pain and hoarse voice.   Respiratory: Negative for cough and shortness of breath.     Patient Active Problem List   Diagnosis Date Noted  . History of recurrent urinary tract infection 03/24/2016  . Cystocele 09/13/2013  . Palpitations 06/04/2012  . OSTEOPENIA 06/28/2009  . Hyperlipidemia LDL goal <100 06/02/2007  . 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 Hearing loss 06/02/2007  . Essential hypertension, benign 06/02/2007  . Acute bilateral low back pain with bilateral sciatica 06/02/2007    Prior to Admission medications   Medication Sig Start Date End Date Taking? Authorizing Provider  aspirin EC 81 MG tablet Take 81 mg by mouth at bedtime.     [provider]  Cholecalciferol (VITAMIN D HIGH POTENCY PO) Take by mouth.    [provider]  estradiol (ESTRACE) 0.1 MG/GM vaginal cream Place 1 Applicatorful vaginally at bedtime.    [provider]  hydrochlorothiazide (HYDRODIURIL) 25 MG tablet Take 1 tablet (25 mg total) by mouth daily. 08/07/16   [name], MD  metoprolol tartrate (LOPRESSOR) 25 MG tablet Take 1.5 tablets (37.5 mg total) by mouth 2 (two) times daily. 02/22/17   [name], MD  ramipril (ALTACE) 10 MG capsule Take 1  capsule (10 mg total) by mouth daily. 07/06/16   [name], MD  simvastatin (ZOCOR) 10 MG tablet Take 1 tablet (10 mg total) daily by mouth. 04/19/17   [name], MD    Allergies  Allergen Reactions  . [name] Diarrhea  . [name]     Past Surgical History:  Procedure Laterality Date  . ABDOMINAL HYSTERECTOMY    . ANTERIOR AND POSTERIOR REPAIR N/A 05/02/2013   Procedure: ANTERIOR (CYSTOCELE) AND POSTERIOR REPAIR (RECTOCELE);  Surgeon: [name], MD;  Location: [facility];  Service: Gynecology;  Laterality: N/A;  . COLONOSCOPY  08/28/2011   Procedure: COLONOSCOPY;  Surgeon: [name], MD;  [facility];  Service: Endoscopy;  Laterality: N/A;  930  . EYE SURGERY Bilateral 02/13/2011   other 2013  . 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 TUBAL LIGATION    . VAGINAL HYSTERECTOMY N/A 05/02/2013   Procedure: HYSTERECTOMY VAGINAL;  Surgeon: [name], MD;  Location: [facility];  Service: Gynecology;  Laterality: N/A;    Social History   Tobacco Use  . Smoking status: Never Smoker  . Smokeless tobacco: Never Used  Substance Use Topics  . Alcohol use: No  . Drug use: No     Medication list has been reviewed and updated.  PHQ 2/9 Scores 09/29/2016 05/01/2016 04/02/2015 03/14/2014  PHQ - 2 Score 0 0 0 0  PHQ- 9 Score - - - 1  Physical Exam  Constitutional: She is oriented to person, place, and time. She appears well-developed and well-nourished.  HENT:  Right Ear: External ear and ear canal normal. Tympanic membrane is not erythematous and not retracted.  Left Ear: External ear and ear canal normal. Tympanic membrane is not erythematous and not retracted.  Nose: Right sinus exhibits maxillary sinus tenderness and frontal sinus tenderness. Left sinus exhibits maxillary sinus tenderness and frontal sinus tenderness.  Mouth/Throat: Uvula is midline and mucous membranes are normal. No oral  lesions. Posterior oropharyngeal erythema present. No oropharyngeal exudate.  Cardiovascular: Normal rate, regular rhythm and normal heart sounds.  Pulmonary/Chest: Breath sounds normal. She has no wheezes. She has no rales.  Lymphadenopathy:    She has no cervical adenopathy.  Neurological: She is alert and oriented to person, place, and time.    BP (!) 162/84   Pulse 67   Temp 98.2 F (36.8 C) (Oral)   Ht 5\' 2"  (1.575 m)   Wt 140 lb (63.5 kg)   SpO2 98%   BMI 25.61 kg/m   Assessment and Plan: 1. Acute maxillary sinusitis, recurrence not specified Continue Mucinex - clarithromycin (BIAXIN) 500 MG tablet; Take 1 tablet (500 mg total) by mouth 2 (two) times daily for 10 days.  Dispense: 20 tablet; Refill: 0   Meds ordered this encounter  Medications  . clarithromycin (BIAXIN) 500 MG tablet    Sig: Take 1 tablet (500 mg total) by mouth 2 (two) times daily for 10 days.    Dispense:  20 tablet    Refill:  0    Partially dictated using Editor, commissioning. Any errors are unintentional.  Halina Maidens, MD [facility]  05/11/2017

## 2017-05-11 NOTE — Patient Instructions (Signed)
Continue Mucinex twice a day Take plenty of fluids Hold Zocor while taking Clarithromycin

## 2017-06-14 ENCOUNTER — Other Ambulatory Visit: Payer: Self-pay | Admitting: Internal Medicine

## 2017-07-16 ENCOUNTER — Other Ambulatory Visit: Payer: Self-pay | Admitting: Internal Medicine

## 2017-08-09 DIAGNOSIS — E785 Hyperlipidemia, unspecified: Secondary | ICD-10-CM | POA: Diagnosis not present

## 2017-08-09 DIAGNOSIS — Z809 Family history of malignant neoplasm, unspecified: Secondary | ICD-10-CM | POA: Diagnosis not present

## 2017-08-09 DIAGNOSIS — K219 Gastro-esophageal reflux disease without esophagitis: Secondary | ICD-10-CM | POA: Diagnosis not present

## 2017-08-09 DIAGNOSIS — E559 Vitamin D deficiency, unspecified: Secondary | ICD-10-CM | POA: Diagnosis not present

## 2017-08-09 DIAGNOSIS — Z8249 Family history of ischemic heart disease and other diseases of the circulatory system: Secondary | ICD-10-CM | POA: Diagnosis not present

## 2017-08-09 DIAGNOSIS — Z79890 Hormone replacement therapy (postmenopausal): Secondary | ICD-10-CM | POA: Diagnosis not present

## 2017-08-09 DIAGNOSIS — I10 Essential (primary) hypertension: Secondary | ICD-10-CM | POA: Diagnosis not present

## 2017-08-09 DIAGNOSIS — I499 Cardiac arrhythmia, unspecified: Secondary | ICD-10-CM | POA: Diagnosis not present

## 2017-08-09 DIAGNOSIS — K08109 Complete loss of teeth, unspecified cause, unspecified class: Secondary | ICD-10-CM | POA: Diagnosis not present

## 2017-08-09 DIAGNOSIS — N952 Postmenopausal atrophic vaginitis: Secondary | ICD-10-CM | POA: Diagnosis not present

## 2017-08-30 DIAGNOSIS — L570 Actinic keratosis: Secondary | ICD-10-CM | POA: Diagnosis not present

## 2017-10-04 ENCOUNTER — Ambulatory Visit (INDEPENDENT_AMBULATORY_CARE_PROVIDER_SITE_OTHER): Payer: Medicare HMO

## 2017-10-04 VITALS — BP 118/60 | HR 66 | Temp 98.3°F | Resp 12 | Ht 62.0 in | Wt 140.8 lb

## 2017-10-04 DIAGNOSIS — Z1239 Encounter for other screening for malignant neoplasm of breast: Secondary | ICD-10-CM

## 2017-10-04 DIAGNOSIS — Z1231 Encounter for screening mammogram for malignant neoplasm of breast: Secondary | ICD-10-CM

## 2017-10-04 DIAGNOSIS — Z0000 Encounter for general adult medical examination without abnormal findings: Secondary | ICD-10-CM | POA: Diagnosis not present

## 2017-10-04 NOTE — Progress Notes (Signed)
Subjective:   Rebecca Gutierrez is a 77 y.o. female who presents for Medicare Annual (Subsequent) preventive examination.  Review of Systems:  N/A Cardiac Risk Factors include: advanced age (>94men, >42 women);dyslipidemia;hypertension;sedentary lifestyle     Objective:     Vitals: BP 118/60 (BP Location: Right Arm, Patient Position: Sitting, Cuff Size: Normal)   Pulse 66   Temp 98.3 F (36.8 C) (Oral)   Resp 12   Ht 5\' 2"  (1.575 m)   Wt 140 lb 12.8 oz (63.9 kg)   SpO2 94%   BMI 25.75 kg/m   Body mass index is 25.75 kg/m.  Advanced Directives 10/04/2017 09/29/2016 02/10/2016 05/02/2013 04/26/2013 08/28/2011  Does Patient Have a Medical Advance Directive? Yes Yes Yes Patient has advance directive, copy not in chart Patient has advance directive, copy not in chart Patient has advance directive, copy not in chart  Type of Advance Directive [place];Living will Healthcare Power of Attorney Living will;Healthcare Power of [place];Living will [place];Living will [place]  Does patient want to make changes to medical advance directive? - - - No No -  Copy of Healthcare Power of Attorney in Chart? No - copy requested No - copy requested - Copy requested from family Copy requested from family -  Pre-existing out of facility DNR order (yellow form or pink MOST form) - - - No No No    Tobacco Social History   Tobacco Use  Smoking Status Never Smoker  Smokeless Tobacco Never Used  Tobacco Comment   smoking cessation materials not required     Counseling given: No Comment: smoking cessation materials not required   Clinical Intake:  Pre-visit preparation completed: Yes  Pain : No/denies pain   BMI - recorded: 25.75 Nutritional Status: BMI 25 -29 Overweight Nutritional Risks: None Diabetes: No  How often do you need to have someone help you when you read instructions, pamphlets, or  other written materials from your doctor or pharmacy?: 1 - Never  Interpreter Needed?: No  Information entered by :: AEversole, LPN  Past Medical History:  Diagnosis Date  . Anxiety   . Cancer ([place])    skin ca  . Gilbert's syndrome   . Hearing loss 1975  . Hyperlipidemia 2000  . Hypertension 2000   Past Surgical History:  Procedure Laterality Date  . ABDOMINAL HYSTERECTOMY    . ANTERIOR AND POSTERIOR REPAIR N/A 05/02/2013   Procedure: ANTERIOR (CYSTOCELE) AND POSTERIOR REPAIR (RECTOCELE);  Surgeon: [name], MD;  Location: [facility];  Service: Gynecology;  Laterality: N/A;  . COLONOSCOPY  08/28/2011   Procedure: COLONOSCOPY;  Surgeon: [name], MD;  [facility];  Service: Endoscopy;  Laterality: N/A;  930  . EYE SURGERY Bilateral 02/13/2011   other 2013  . 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 TUBAL LIGATION    . VAGINAL HYSTERECTOMY N/A 05/02/2013   Procedure: HYSTERECTOMY VAGINAL;  Surgeon: [name], MD;  Location: [facility];  Service: Gynecology;  Laterality: N/A;   Family History  Problem Relation Age of Onset  . Hypertension Mother   . Cancer Mother        stomach  . Healthy Father   . Healthy Brother   . Colon cancer Neg Hx   . Prostate cancer Neg Hx   . Kidney cancer Neg Hx   . Bladder Cancer Neg Hx   . Breast  cancer Neg Hx    Social History   Socioeconomic History  . Marital status: Widowed    Spouse name: Not on file  . Number of children: 2  . Years of education: Not on file  . Highest education level: 12th grade  Occupational History  . Occupation: Retired  Scientific laboratory technician  . Financial resource strain: Not hard at all  . Food insecurity:    Worry: Never true    Inability: Never true  . Transportation needs:    Medical: No    Non-medical: No  Tobacco Use  . Smoking status: Never Smoker  . Smokeless tobacco: Never Used  . Tobacco comment: smoking cessation materials not required  Substance and  Sexual Activity  . Alcohol use: No  . Drug use: No  . Sexual activity: Never    Birth control/protection: Post-menopausal, Surgical  Lifestyle  . Physical activity:    Days per week: 3 days    Minutes per session: 30 min  . Stress: Not at all  Relationships  . Social connections:    Talks on phone: Patient refused    Gets together: Patient refused    Attends religious service: Patient refused    Active member of club or organization: Patient refused    Attends meetings of clubs or organizations: Patient refused    Relationship status: Widowed  Other Topics Concern  . Not on file  Social History Narrative  . Not on file    Outpatient Encounter Medications as of 10/04/2017  Medication Sig  . aspirin EC 81 MG tablet Take 81 mg by mouth at bedtime.   . Cholecalciferol (VITAMIN D HIGH POTENCY PO) Take by mouth.  . estradiol (ESTRACE) 0.1 MG/GM vaginal cream Place 1 Applicatorful vaginally at bedtime.  . hydrochlorothiazide (HYDRODIURIL) 25 MG tablet TAKE 1 TABLET DAILY  . metoprolol tartrate (LOPRESSOR) 25 MG tablet Take 1.5 tablets (37.5 mg total) by mouth 2 (two) times daily.  . ramipril (ALTACE) 10 MG capsule TAKE 1 CAPSULE DAILY  . simvastatin (ZOCOR) 10 MG tablet Take 1 tablet (10 mg total) daily by mouth.  Marland Kitchen tiZANidine (ZANAFLEX) 2 MG tablet Take by mouth.   No facility-administered encounter medications on file as of 10/04/2017.     Activities of Daily Living In your present state of health, do you have any difficulty performing the following activities: 10/04/2017  Hearing? N  Comment B hearing aids  Vision? N  Comment wears eyeglasses  Difficulty concentrating or making decisions? N  Walking or climbing stairs? N  Dressing or bathing? N  Doing errands, shopping? N  Preparing Food and eating ? N  Comment full set upper and lower dentures  Using the Toilet? N  In the past six months, have you accidently leaked urine? N  Do you have problems with loss of bowel  control? N  Managing your Medications? N  Managing your Finances? N  Housekeeping or managing your Housekeeping? N  Some recent data might be hidden    Patient Care Team: [name], MD as PCP - General (Family Medicine) Jannet Mantis, MD (Dermatology) Hollice Espy, MD as Consulting Physician (Urology)    Assessment:   This is a routine wellness examination for [place].  Exercise Activities and Dietary recommendations Current Exercise Habits: Home exercise routine, Type of exercise: walking, Time (Minutes): 30, Frequency (Times/Week): 3, Weekly Exercise (Minutes/Week): 90, Intensity: Mild, Exercise limited by: None identified  Goals    . DIET - INCREASE WATER INTAKE  Recommend to drink at least 6-8 8oz glasses of water per day.       Fall Risk Fall Risk  10/04/2017 09/29/2016 05/01/2016 04/02/2015 04/02/2015  Falls in the past year? Yes No No No No  Comment playing with grandson, kicked ball and lost balance - - - -  Number falls in past yr: 1 - - - -  Injury with Fall? No - - - -  Risk for fall due to : Impaired vision - - - -  Risk for fall due to: Comment wears eyeglasses - - - -  Follow up Falls evaluation completed;Education provided;Falls prevention discussed - - - -   Is the home free of loose throw rugs in walkways, pet beds, electrical cords, etc? Yes Adequate lighting to reduce risk of falls?  Yes In addition, does the patient have any of the following: Stairs in or around the home WITH handrails? No Grab bars in the bathroom? No  Shower chair or a place to sit while bathing? Yes Use of an elevated toilet seat or a handicapped toilet? No Use of a cane, walker or w/c? No  Timed Get Up and Go Performed: Yes. Pt ambulated 10 feet within 8 sec. Gait stead-fast and without the use of an assistive device. No intervention required at this time. Fall risk prevention has been discussed.  Pt declined my offer to send Community Resource Referral to Care  Guide for installation of grab bars in the shower or an elevated toilet seat.  Depression Screen PHQ 2/9 Scores 10/04/2017 09/29/2016 05/01/2016 04/02/2015  PHQ - 2 Score 0 0 0 0  PHQ- 9 Score 0 - - -     Cognitive Function     6CIT Screen 10/04/2017 09/29/2016  What Year? 0 points 0 points  What month? 0 points 0 points  What time? 0 points 0 points  Count back from 20 0 points 0 points  Months in reverse 0 points 0 points  Repeat phrase 0 points 0 points  Total Score 0 0    Immunization History  Administered Date(s) Administered  . Influenza Split 04/01/2015  . Influenza Whole 03/25/2006, 05/25/2011  . Influenza, High Dose Seasonal PF 03/31/2017  . Influenza,inj,Quad PF,6+ Mos 03/01/2013, 03/14/2014, 03/24/2016  . Pneumococcal Conjugate-13 09/17/2014  . Pneumococcal Polysaccharide-23 09/23/2009  . Td 02/04/2004, 03/14/2014  . Zoster 07/17/2010    Qualifies for Shingles Vaccine? Yes. Zostavax completed 07/17/10. Due for Shingrix vaccine. Education has been provided regarding the importance of this vaccine. Pt has been advised to call her insurance company to determine her out of pocket expense. Advised she may also receive this vaccine at her local pharmacy or Health Dept. Verbalized acceptance and understanding.  Screening Tests Health Maintenance  Topic Date Due  . MAMMOGRAM  10/01/2017  . INFLUENZA VACCINE  01/13/2018  . COLONOSCOPY  08/27/2021  . TETANUS/TDAP  03/14/2024  . DEXA SCAN  Completed  . PNA vac Low Risk Adult  Completed    Cancer Screenings: Lung: Low Dose CT Chest recommended if Age 37-80 years, 30 pack-year currently smoking OR have quit w/in 15years. Patient does not qualify. Breast:  Up to date on Mammogram? Yes. Completed 10/01/16. Repeat every year. Ordered today. Message sent to referral coordinator for scheduling purposes. Pt aware that she will receive a call from our office re: her appt. Verbalized acceptance and understanding. Up to date of Bone  Density/Dexa? Yes. Completed 09/22/13. Osteoporotic screening no longer required Colorectal: Completed colonoscopy 08/28/11. Repeat every 10 years  Additional Screenings: Hepatitis C Screening: Does not qualify    Plan:  I have personally reviewed and addressed the Medicare Annual Wellness questionnaire and have noted the following in the patient's chart:  A. Medical and social history B. Use of alcohol, tobacco or illicit drugs  C. Current medications and supplements D. Functional ability and status E.  Nutritional status F.  Physical activity G. Advance directives H. List of other physicians I.  Hospitalizations, surgeries, and ER visits in previous 12 months J.  [place] such as hearing and vision if needed, cognitive and depression L. Referrals and appointments  In addition, I have reviewed and discussed with patient certain preventive protocols, quality metrics, and best practice recommendations. A written personalized care plan for preventive services as well as general preventive health recommendations were provided to patient.  Signed,  Aleatha Borer, LPN Nurse Health Advisor  MD Recommendations: Zostavax completed 07/17/10. Due for Shingrix vaccine. Education has been provided regarding the importance of this vaccine. Pt has been advised to call her insurance company to determine her out of pocket expense. Advised she may also receive this vaccine at her local pharmacy or Health Dept. Verbalized acceptance and understanding.  Due for mammogram. Completed 10/01/16. Repeat every year. Ordered today. Message sent to referral coordinator for scheduling purposes. Pt aware that she will receive a call from our office re: her appt. Verbalized acceptance and understanding.

## 2017-10-04 NOTE — Patient Instructions (Addendum)
Rebecca Gutierrez , Thank you for taking time to come for your Medicare Wellness Visit. I appreciate your ongoing commitment to your health goals. Please review the following plan we discussed and let me know if I can assist you in the future.   Screening recommendations/referrals: Colorectal Screening: Completed colonoscopy 08/28/11. Repeat every 10 years Mammogram: Completed 10/01/16. Repeat every year. Ordered today.  Bone Density: Completed 09/22/13. Osteoporotic screening no longer required  Vision and Dental Exams: Recommended annual ophthalmology exams for early detection of glaucoma and other disorders of the eye Recommended annual dental exams for proper oral hygiene  Vaccinations: Influenza vaccine: Up to date Pneumococcal vaccine: Completed series Tdap vaccine: Up to date Shingles vaccine: Up to date. Please call your insurance company to determine your out of pocket expense for the Shingrix vaccine. You may also receive this vaccine at your local pharmacy or Health Dept.   Advanced directives: Please bring a copy of your POA (Power of Attorney) and/or Living Will to your next appointment.  Conditions/risks identified: Recommend to drink at least 6-8 8oz glasses of water per day.  Next appointment: Please schedule your Annual Wellness Visit with your Nurse Health Advisor in one year.  Preventive Care 29 Years and Older, Female Preventive care refers to lifestyle choices and visits with your health care provider that can promote health and wellness. What does preventive care include?  A yearly physical exam. This is also called an annual well check.  Dental exams once or twice a year.  Routine eye exams. Ask your health care provider how often you should have your eyes checked.  Personal lifestyle choices, including:  Daily care of your teeth and gums.  Regular physical activity.  Eating a healthy diet.  Avoiding tobacco and drug use.  Limiting alcohol use.  Practicing  safe sex.  Taking low-dose aspirin every day.  Taking vitamin and mineral supplements as recommended by your health care provider. What happens during an annual well check? The services and screenings done by your health care provider during your annual well check will depend on your age, overall health, lifestyle risk factors, and family history of disease. Counseling  Your health care provider may ask you questions about your:  Alcohol use.  Tobacco use.  Drug use.  Emotional well-being.  Home and relationship well-being.  Sexual activity.  Eating habits.  History of falls.  Memory and ability to understand (cognition).  Work and work Statistician.  Reproductive health. Screening  You may have the following tests or measurements:  Height, weight, and BMI.  Blood pressure.  Lipid and cholesterol levels. These may be checked every 5 years, or more frequently if you are over 64 years old.  Skin check.  Lung cancer screening. You may have this screening every year starting at age 73 if you have a 30-pack-year history of smoking and currently smoke or have quit within the past 15 years.  Fecal occult blood test (FOBT) of the stool. You may have this test every year starting at age 29.  Flexible sigmoidoscopy or colonoscopy. You may have a sigmoidoscopy every 5 years or a colonoscopy every 10 years starting at age 36.  Hepatitis C blood test.  Hepatitis B blood test.  Sexually transmitted disease (STD) testing.  Diabetes screening. This is done by checking your blood sugar (glucose) after you have not eaten for a while (fasting). You may have this done every 1-3 years.  Bone density scan. This is done to screen for osteoporosis. You may  have this done starting at age 13.  Mammogram. This may be done every 1-2 years. Talk to your health care provider about how often you should have regular mammograms. Talk with your health care provider about your test results,  treatment options, and if necessary, the need for more tests. Vaccines  Your health care provider may recommend certain vaccines, such as:  Influenza vaccine. This is recommended every year.  Tetanus, diphtheria, and acellular pertussis (Tdap, Td) vaccine. You may need a Td booster every 10 years.  Zoster vaccine. You may need this after age 80.  Pneumococcal 13-valent conjugate (PCV13) vaccine. One dose is recommended after age 63.  Pneumococcal polysaccharide (PPSV23) vaccine. One dose is recommended after age 70. Talk to your health care provider about which screenings and vaccines you need and how often you need them. This information is not intended to replace advice given to you by your health care provider. Make sure you discuss any questions you have with your health care provider. Document Released: 06/28/2015 Document Revised: 02/19/2016 Document Reviewed: 04/02/2015 Elsevier Interactive Patient Education  2017 [place] Prevention in the Home Falls can cause injuries. They can happen to people of all ages. There are many things you can do to make your home safe and to help prevent falls. What can I do on the outside of my home?  Regularly fix the edges of walkways and driveways and fix any cracks.  Remove anything that might make you trip as you walk through a door, such as a raised step or threshold.  Trim any bushes or trees on the path to your home.  Use bright outdoor lighting.  Clear any walking paths of anything that might make someone trip, such as rocks or tools.  Regularly check to see if handrails are loose or broken. Make sure that both sides of any steps have handrails.  Any raised decks and porches should have guardrails on the edges.  Have any leaves, snow, or ice cleared regularly.  Use sand or salt on walking paths during winter.  Clean up any spills in your garage right away. This includes oil or grease spills. What can I do in the  bathroom?  Use night lights.  Install grab bars by the toilet and in the tub and shower. Do not use towel bars as grab bars.  Use non-skid mats or decals in the tub or shower.  If you need to sit down in the shower, use a plastic, non-slip stool.  Keep the floor dry. Clean up any water that spills on the floor as soon as it happens.  Remove soap buildup in the tub or shower regularly.  Attach bath mats securely with double-sided non-slip rug tape.  Do not have throw rugs and other things on the floor that can make you trip. What can I do in the bedroom?  Use night lights.  Make sure that you have a light by your bed that is easy to reach.  Do not use any sheets or blankets that are too big for your bed. They should not hang down onto the floor.  Have a firm chair that has side arms. You can use this for support while you get dressed.  Do not have throw rugs and other things on the floor that can make you trip. What can I do in the kitchen?  Clean up any spills right away.  Avoid walking on wet floors.  Keep items that you use a lot in  easy-to-reach places.  If you need to reach something above you, use a strong step stool that has a grab bar.  Keep electrical cords out of the way.  Do not use floor polish or wax that makes floors slippery. If you must use wax, use non-skid floor wax.  Do not have throw rugs and other things on the floor that can make you trip. What can I do with my stairs?  Do not leave any items on the stairs.  Make sure that there are handrails on both sides of the stairs and use them. Fix handrails that are broken or loose. Make sure that handrails are as long as the stairways.  Check any carpeting to make sure that it is firmly attached to the stairs. Fix any carpet that is loose or worn.  Avoid having throw rugs at the top or bottom of the stairs. If you do have throw rugs, attach them to the floor with carpet tape.  Make sure that you have a  light switch at the top of the stairs and the bottom of the stairs. If you do not have them, ask someone to add them for you. What else can I do to help prevent falls?  Wear shoes that:  Do not have high heels.  Have rubber bottoms.  Are comfortable and fit you well.  Are closed at the toe. Do not wear sandals.  If you use a stepladder:  Make sure that it is fully opened. Do not climb a closed stepladder.  Make sure that both sides of the stepladder are locked into place.  Ask someone to hold it for you, if possible.  Clearly mark and make sure that you can see:  Any grab bars or handrails.  First and last steps.  Where the edge of each step is.  Use tools that help you move around (mobility aids) if they are needed. These include:  Canes.  Walkers.  Scooters.  Crutches.  Turn on the lights when you go into a dark area. Replace any light bulbs as soon as they burn out.  Set up your furniture so you have a clear path. Avoid moving your furniture around.  If any of your floors are uneven, fix them.  If there are any pets around you, be aware of where they are.  Review your medicines with your doctor. Some medicines can make you feel dizzy. This can increase your chance of falling. Ask your doctor what other things that you can do to help prevent falls. This information is not intended to replace advice given to you by your health care provider. Make sure you discuss any questions you have with your health care provider. Document Released: 03/28/2009 Document Revised: 11/07/2015 Document Reviewed: 07/06/2014 Elsevier Interactive Patient Education  2017 [employer].

## 2017-10-07 DIAGNOSIS — L570 Actinic keratosis: Secondary | ICD-10-CM | POA: Diagnosis not present

## 2017-10-07 DIAGNOSIS — L578 Other skin changes due to chronic exposure to nonionizing radiation: Secondary | ICD-10-CM | POA: Diagnosis not present

## 2017-10-07 DIAGNOSIS — Z86018 Personal history of other benign neoplasm: Secondary | ICD-10-CM | POA: Diagnosis not present

## 2017-10-07 DIAGNOSIS — D485 Neoplasm of uncertain behavior of skin: Secondary | ICD-10-CM | POA: Diagnosis not present

## 2017-10-07 DIAGNOSIS — Z872 Personal history of diseases of the skin and subcutaneous tissue: Secondary | ICD-10-CM | POA: Diagnosis not present

## 2017-10-12 ENCOUNTER — Ambulatory Visit
Admission: RE | Admit: 2017-10-12 | Discharge: 2017-10-12 | Disposition: A | Discharge status: Home / Self Care | Payer: Medicare HMO | Source: Ambulatory Visit | Attending: Internal Medicine | Admitting: Internal Medicine

## 2017-10-12 DIAGNOSIS — Z1231 Encounter for screening mammogram for malignant neoplasm of breast: Secondary | ICD-10-CM | POA: Insufficient documentation

## 2017-10-12 DIAGNOSIS — Z1239 Encounter for other screening for malignant neoplasm of breast: Secondary | ICD-10-CM

## 2017-10-19 ENCOUNTER — Encounter: Payer: Self-pay | Admitting: Internal Medicine

## 2017-10-19 ENCOUNTER — Ambulatory Visit (INDEPENDENT_AMBULATORY_CARE_PROVIDER_SITE_OTHER): Payer: Medicare HMO | Admitting: Internal Medicine

## 2017-10-19 ENCOUNTER — Other Ambulatory Visit: Payer: Self-pay | Admitting: Internal Medicine

## 2017-10-19 VITALS — BP 122/78 | HR 72 | Resp 16 | Ht 62.0 in | Wt 135.7 lb

## 2017-10-19 DIAGNOSIS — I10 Essential (primary) hypertension: Secondary | ICD-10-CM

## 2017-10-19 DIAGNOSIS — E785 Hyperlipidemia, unspecified: Secondary | ICD-10-CM | POA: Diagnosis not present

## 2017-10-19 DIAGNOSIS — Z0001 Encounter for general adult medical examination with abnormal findings: Secondary | ICD-10-CM

## 2017-10-19 DIAGNOSIS — Z0000 Encounter for general adult medical examination without abnormal findings: Secondary | ICD-10-CM

## 2017-10-19 DIAGNOSIS — R300 Dysuria: Secondary | ICD-10-CM | POA: Diagnosis not present

## 2017-10-19 DIAGNOSIS — R011 Cardiac murmur, unspecified: Secondary | ICD-10-CM

## 2017-10-19 LAB — POC URINALYSIS WITH MICROSCOPIC (NON AUTO)MANUAL RESULT
BILIRUBIN UA: NEGATIVE
Crystals: 0
Epithelial cells, urine per micros: 0
GLUCOSE UA: NEGATIVE
KETONES UA: NEGATIVE
Mucus, UA: 0
NITRITE UA: NEGATIVE
PH UA: 6.5 (ref 5.0–8.0)
Protein, UA: NEGATIVE
RBC: 5 M/uL (ref 4.04–5.48)
SPEC GRAV UA: 1.01 (ref 1.010–1.025)
UROBILINOGEN UA: 0.2 U/dL

## 2017-10-19 MED ORDER — NITROFURANTOIN MONOHYD MACRO 100 MG PO CAPS: 100.0000 mg | ORAL_CAPSULE | Freq: Two times a day (BID) | ORAL | 0 refills | Status: DC

## 2017-10-19 NOTE — Progress Notes (Signed)
Date:  10/19/2017   Name:  Rebecca Gutierrez   DOB:  December 13, 1940   MRN:  [id number]   Chief Complaint: Annual Exam Rebecca Gutierrez is a 77 y.o. female who presents today for her Complete Annual Exam. She feels fairly well. She reports exercising walking. She reports she is sleeping well.   Mammogram done earlier this year.  Recently had MAW.  Immunizations are up to date.  Hypertension  This is a chronic problem. The problem is controlled. Pertinent negatives include no chest pain, headaches, palpitations or shortness of breath. There are no associated agents to hypertension. Past treatments include beta blockers, diuretics and ACE inhibitors. The current treatment provides significant improvement.  Hyperlipidemia  This is a chronic problem. The problem is controlled. Pertinent negatives include no chest pain or shortness of breath. Current antihyperlipidemic treatment includes statins. The current treatment provides significant improvement of lipids.  Dysuria   This is a new problem. The current episode started yesterday. The problem has been waxing and waning. The pain is mild. There has been no fever. Associated symptoms include frequency. Pertinent negatives include no chills or vomiting. She has tried nothing for the symptoms.     Review of Systems  Constitutional: Negative for chills, fatigue and fever.  HENT: Negative for congestion, hearing loss, tinnitus, trouble swallowing and voice change.   Eyes: Negative for visual disturbance.  Respiratory: Negative for cough, chest tightness, shortness of breath and wheezing.   Cardiovascular: Negative for chest pain, palpitations and leg swelling.  Gastrointestinal: Negative for abdominal pain, constipation, diarrhea and vomiting.  Endocrine: Negative for polydipsia and polyuria.  Genitourinary: Positive for frequency. Negative for dysuria, genital sores, vaginal bleeding and vaginal discharge.  Musculoskeletal: Negative for  arthralgias, gait problem and joint swelling.  Skin: Negative for color change and rash.  Allergic/Immunologic: Negative for environmental allergies.  Neurological: Negative for dizziness, tremors, light-headedness and headaches.  Hematological: Negative for adenopathy. Does not bruise/bleed easily.  Psychiatric/Behavioral: Negative for dysphoric mood and sleep disturbance. The patient is not nervous/anxious.     Patient Active Problem List   Diagnosis Date Noted  . History of recurrent urinary tract infection 03/24/2016  . Cystocele 09/13/2013  . Palpitations 06/04/2012  . OSTEOPENIA 06/28/2009  . Hyperlipidemia LDL goal <100 06/02/2007  . 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 Hearing loss 06/02/2007  . Essential hypertension, benign 06/02/2007  . Acute bilateral low back pain with bilateral sciatica 06/02/2007    Prior to Admission medications   Medication Sig Start Date End Date Taking? Authorizing Provider  aspirin EC 81 MG tablet Take 81 mg by mouth at bedtime.     [provider]  Cholecalciferol (VITAMIN D HIGH POTENCY PO) Take by mouth.    [provider]  estradiol (ESTRACE) 0.1 MG/GM vaginal cream Place 1 Applicatorful vaginally at bedtime.    [provider]  hydrochlorothiazide (HYDRODIURIL) 25 MG tablet TAKE 1 TABLET DAILY 07/16/17   [name], MD  metoprolol tartrate (LOPRESSOR) 25 MG tablet Take 1.5 tablets (37.5 mg total) by mouth 2 (two) times daily. 02/22/17   [name], MD  ramipril (ALTACE) 10 MG capsule TAKE 1 CAPSULE DAILY 06/14/17   [name], MD  simvastatin (ZOCOR) 10 MG tablet Take 1 tablet (10 mg total) daily by mouth. 04/19/17   [name], MD  tiZANidine (ZANAFLEX) 2 MG tablet Take by mouth. 09/23/16   [provider]    Allergies  Allergen Reactions  .  Levaquin [Levofloxacin] Other (See Comments)    insomnia  . [name] Diarrhea  . [name]      Past Surgical History:  Procedure Laterality Date  . ABDOMINAL HYSTERECTOMY    . ANTERIOR AND POSTERIOR REPAIR N/A 05/02/2013   Procedure: ANTERIOR (CYSTOCELE) AND POSTERIOR REPAIR (RECTOCELE);  Surgeon: [name], MD;  Location: [facility];  Service: Gynecology;  Laterality: N/A;  . COLONOSCOPY  08/28/2011   Procedure: COLONOSCOPY;  Surgeon: [name], MD;  Location: [facility];  Service: Endoscopy;  Laterality: N/A;  930  . EYE SURGERY Bilateral 02/13/2011   other 2013  . 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 TUBAL LIGATION    . VAGINAL HYSTERECTOMY N/A 05/02/2013   Procedure: HYSTERECTOMY VAGINAL;  Surgeon: [name], MD;  Location: [facility];  Service: Gynecology;  Laterality: N/A;    Social History   Tobacco Use  . Smoking status: Never Smoker  . Smokeless tobacco: Never Used  . Tobacco comment: smoking cessation materials not required  Substance Use Topics  . Alcohol use: No  . Drug use: No     Medication list has been reviewed and updated.  PHQ 2/9 Scores 10/04/2017 09/29/2016 05/01/2016 04/02/2015  PHQ - 2 Score 0 0 0 0  PHQ- 9 Score 0 - - -    Physical Exam  Constitutional: She is oriented to person, place, and time. She appears well-developed and well-nourished. No distress.  HENT:  Head: Normocephalic and atraumatic.  Right Ear: Tympanic membrane and ear canal normal.  Left Ear: Tympanic membrane and ear canal normal.  Nose: Right sinus exhibits no maxillary sinus tenderness. Left sinus exhibits no maxillary sinus tenderness.  Mouth/Throat: Uvula is midline and oropharynx is clear and moist.  Eyes: Conjunctivae and EOM are normal. Right eye exhibits no discharge. Left eye exhibits no discharge. No scleral icterus.  Neck: Normal range of motion. No JVD present. Carotid bruit is not present. No erythema present. No thyromegaly present.  Cardiovascular: Normal rate, regular rhythm and normal pulses.  No extrasystoles are  present.  Murmur heard.  Systolic murmur is present with a grade of 2/6. Click- murmur heard best at apex  Pulmonary/Chest: Effort normal. No respiratory distress. She has no wheezes. Right breast exhibits no mass, no nipple discharge, no skin change and no tenderness. Left breast exhibits no mass, no nipple discharge, no skin change and no tenderness.  Abdominal: Soft. Bowel sounds are normal. There is no hepatosplenomegaly. There is no tenderness. There is no CVA tenderness.  Musculoskeletal: Normal range of motion. She exhibits no edema or tenderness.  Lymphadenopathy:    She has no cervical adenopathy.    She has no axillary adenopathy.  Neurological: She is alert and oriented to person, place, and time. She has normal reflexes. No cranial nerve deficit or sensory deficit.  Skin: Skin is warm, dry and intact. No rash noted.     Psychiatric: She has a normal mood and affect. Her speech is normal and behavior is normal. Thought content normal.  Nursing note and vitals reviewed.   BP 122/78   Pulse 72   Resp 16   Ht 5\' 2"  (1.575 m)   Wt 135 lb 11.2 oz (61.6 kg)   SpO2 97%   BMI 24.82 kg/m   Assessment and Plan: 1. Annual physical exam Up to date HM MAW done recently - POCT urinalysis dipstick  2. Newly recognized heart murmur Refer to Cardiology -  Ambulatory referral to Cardiology  3. Essential hypertension, benign controlled - CBC with Differential/Platelet - Comprehensive metabolic panel - TSH  4. Hyperlipidemia LDL goal <100 On statin therapy - Lipid panel  5. Dysuria Pt notified of results and Rx - POC urinalysis w microscopic (non auto) - nitrofurantoin, macrocrystal-monohydrate, (MACROBID) 100 MG capsule; Take 1 capsule (100 mg total) by mouth 2 (two) times daily for 7 days.  Dispense: 14 capsule; Refill: 0  No orders of the defined types were placed in this encounter.   Partially dictated using Editor, commissioning. Any errors are unintentional.  Halina Maidens, MD [facility]  10/19/2017

## 2017-10-19 NOTE — Patient Instructions (Signed)

## 2017-10-20 LAB — COMPREHENSIVE METABOLIC PANEL
ALT: 18 IU/L (ref 0–32)
AST: 19 IU/L (ref 0–40)
Albumin/Globulin Ratio: 2 (ref 1.2–2.2)
Albumin: 4.7 g/dL (ref 3.5–4.8)
Alkaline Phosphatase: 61 IU/L (ref 39–117)
BUN/Creatinine Ratio: 20 (ref 12–28)
BUN: 16 mg/dL (ref 8–27)
Bilirubin Total: 1.5 mg/dL — ABNORMAL HIGH (ref 0.0–1.2)
CO2: 26 mmol/L (ref 20–29)
Calcium: 9.8 mg/dL (ref 8.7–10.3)
Chloride: 100 mmol/L (ref 96–106)
Creatinine, Ser: 0.79 mg/dL (ref 0.57–1.00)
GFR calc Af Amer: 85 mL/min/{1.73_m2} (ref >59)
GFR calc non Af Amer: 73 mL/min/{1.73_m2} (ref >59)
Globulin, Total: 2.4 g/dL (ref 1.5–4.5)
Glucose: 100 mg/dL — ABNORMAL HIGH (ref 65–99)
Potassium: 3.9 mmol/L (ref 3.5–5.2)
Sodium: 142 mmol/L (ref 134–144)
Total Protein: 7.1 g/dL (ref 6.0–8.5)

## 2017-10-20 LAB — LIPID PANEL
Chol/HDL Ratio: 2.7 ratio (ref 0.0–4.4)
Cholesterol, Total: 194 mg/dL (ref 100–199)
HDL: 72 mg/dL (ref >39)
LDL Calculated: 99 mg/dL (ref 0–99)
Triglycerides: 114 mg/dL (ref 0–149)
VLDL Cholesterol Cal: 23 mg/dL (ref 5–40)

## 2017-10-20 LAB — CBC WITH DIFFERENTIAL/PLATELET
BASOS ABS: 0 10*3/uL (ref 0.0–0.2)
BASOS: 0 %
EOS (ABSOLUTE): 0.2 10*3/uL (ref 0.0–0.4)
Eos: 2 %
Hematocrit: 42.6 % (ref 34.0–46.6)
Hemoglobin: 14.5 g/dL (ref 11.1–15.9)
IMMATURE GRANS (ABS): 0 10*3/uL (ref 0.0–0.1)
IMMATURE GRANULOCYTES: 0 %
LYMPHS: 17 %
Lymphocytes Absolute: 1.3 10*3/uL (ref 0.7–3.1)
MCH: 30.8 pg (ref 26.6–33.0)
MCHC: 34 g/dL (ref 31.5–35.7)
MCV: 90 fL (ref 79–97)
Monocytes Absolute: 0.4 10*3/uL (ref 0.1–0.9)
Monocytes: 6 %
NEUTROS ABS: 5.5 10*3/uL (ref 1.4–7.0)
NEUTROS PCT: 75 %
PLATELETS: 246 10*3/uL (ref 150–379)
RBC: 4.71 x10E6/uL (ref 3.77–5.28)
RDW: 14.1 % (ref 12.3–15.4)
WBC: 7.4 10*3/uL (ref 3.4–10.8)

## 2017-10-20 LAB — TSH: TSH: 3.25 u[IU]/mL (ref 0.450–4.500)

## 2017-10-25 ENCOUNTER — Other Ambulatory Visit: Payer: Self-pay | Admitting: Internal Medicine

## 2017-10-25 ENCOUNTER — Telehealth: Payer: Self-pay

## 2017-10-25 DIAGNOSIS — N3000 Acute cystitis without hematuria: Secondary | ICD-10-CM

## 2017-10-25 MED ORDER — CIPROFLOXACIN HCL 250 MG PO TABS: 250.0000 mg | ORAL_TABLET | Freq: Two times a day (BID) | ORAL | 0 refills | Status: AC

## 2017-10-25 NOTE — Telephone Encounter (Signed)
Complains that the Cipro is the med she usually has to take to get rid of UTI. Nitro is not helping and she has one pill left. She has taken Cipro in past 6 months but not recent. Would like Cipro 500 sent to [facility]?

## 2017-10-25 NOTE — Telephone Encounter (Signed)
I sent in Cipro 250 mg twice a day for 5 days (the correct dose for age and indication).  If she still has sx after that, she will need to be seen in the office and to get urine recheck and possible culture.

## 2017-10-28 DIAGNOSIS — R9431 Abnormal electrocardiogram [ECG] [EKG]: Secondary | ICD-10-CM | POA: Diagnosis not present

## 2017-10-28 DIAGNOSIS — I351 Nonrheumatic aortic (valve) insufficiency: Secondary | ICD-10-CM | POA: Diagnosis not present

## 2017-10-28 DIAGNOSIS — I493 Ventricular premature depolarization: Secondary | ICD-10-CM | POA: Insufficient documentation

## 2017-10-28 DIAGNOSIS — I10 Essential (primary) hypertension: Secondary | ICD-10-CM | POA: Diagnosis not present

## 2017-10-28 DIAGNOSIS — E782 Mixed hyperlipidemia: Secondary | ICD-10-CM | POA: Diagnosis not present

## 2017-10-28 HISTORY — DX: Abnormal electrocardiogram (ECG) (EKG): R94.31

## 2017-11-03 DIAGNOSIS — D2272 Melanocytic nevi of left lower limb, including hip: Secondary | ICD-10-CM | POA: Diagnosis not present

## 2017-11-03 DIAGNOSIS — D485 Neoplasm of uncertain behavior of skin: Secondary | ICD-10-CM | POA: Diagnosis not present

## 2017-11-10 DIAGNOSIS — I340 Nonrheumatic mitral (valve) insufficiency: Secondary | ICD-10-CM | POA: Insufficient documentation

## 2017-11-10 DIAGNOSIS — R001 Bradycardia, unspecified: Secondary | ICD-10-CM | POA: Insufficient documentation

## 2017-11-10 DIAGNOSIS — E782 Mixed hyperlipidemia: Secondary | ICD-10-CM | POA: Diagnosis not present

## 2017-11-10 DIAGNOSIS — I10 Essential (primary) hypertension: Secondary | ICD-10-CM | POA: Diagnosis not present

## 2017-11-10 HISTORY — DX: Bradycardia, unspecified: R00.1

## 2018-01-13 ENCOUNTER — Other Ambulatory Visit: Payer: Self-pay | Admitting: Internal Medicine

## 2018-01-13 ENCOUNTER — Ambulatory Visit: Payer: Medicare HMO | Admitting: Internal Medicine

## 2018-01-13 ENCOUNTER — Encounter: Payer: Self-pay | Admitting: Internal Medicine

## 2018-01-13 VITALS — BP 128/80 | HR 60 | Ht 62.0 in | Wt 140.0 lb

## 2018-01-13 DIAGNOSIS — N3000 Acute cystitis without hematuria: Secondary | ICD-10-CM

## 2018-01-13 LAB — POC URINALYSIS WITH MICROSCOPIC (NON AUTO)MANUAL RESULT
BILIRUBIN UA: NEGATIVE
Crystals: 0
Epithelial cells, urine per micros: 0
GLUCOSE UA: NEGATIVE
Ketones, UA: NEGATIVE
Mucus, UA: 0
Nitrite, UA: POSITIVE
Protein, UA: NEGATIVE
RBC: 5 M/uL (ref 4.04–5.48)
Spec Grav, UA: <=1.005 — AB (ref 1.010–1.025)
UROBILINOGEN UA: 0.2 U/dL
WBC CASTS UA: 30
pH, UA: 6 (ref 5.0–8.0)

## 2018-01-13 MED ORDER — CEPHALEXIN 500 MG PO CAPS: 500.0000 mg | ORAL_CAPSULE | Freq: Four times a day (QID) | ORAL | 0 refills | Status: AC

## 2018-01-13 NOTE — Progress Notes (Signed)
Date:  01/13/2018   Name:  Rebecca Gutierrez   DOB:  Dec 06, 1940   MRN:  [id number]   Chief Complaint: Urinary Tract Infection (having frequent urination, cloudy urine and pressure while urinating) Urinary Tract Infection   This is a new problem. The current episode started yesterday. The problem occurs every urination. The quality of the pain is described as aching. The pain is mild. There has been no fever. Associated symptoms include frequency and urgency. Pertinent negatives include no chills, flank pain, sweats or vomiting. She has tried increased fluids for the symptoms.     Review of Systems  Constitutional: Negative for chills.  Respiratory: Negative for chest tightness and shortness of breath.   Cardiovascular: Negative for chest pain and palpitations.  Gastrointestinal: Negative for vomiting.  Genitourinary: Positive for frequency and urgency. Negative for flank pain.    Patient Active Problem List   Diagnosis Date Noted  . Newly recognized heart murmur 10/19/2017  . History of recurrent urinary tract infection 03/24/2016  . Cystocele 09/13/2013  . Palpitations 06/04/2012  . OSTEOPENIA 06/28/2009  . Hyperlipidemia LDL goal <100 06/02/2007  . 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 Hearing loss 06/02/2007  . Essential hypertension, benign 06/02/2007  . Acute bilateral low back pain with bilateral sciatica 06/02/2007    Allergies  Allergen Reactions  . Levaquin [Levofloxacin] Other (See Comments)    insomnia  . [name] Diarrhea  . [name]     Past Surgical History:  Procedure Laterality Date  . ABDOMINAL HYSTERECTOMY    . ANTERIOR AND POSTERIOR REPAIR N/A 05/02/2013   Procedure: ANTERIOR (CYSTOCELE) AND POSTERIOR REPAIR (RECTOCELE);  Surgeon: [name], MD;  Location: [facility];  Service: Gynecology;  Laterality: N/A;  . COLONOSCOPY  08/28/2011   Procedure: COLONOSCOPY;  Surgeon: [name], MD;  Location: [facility];  Service: Endoscopy;  Laterality: N/A;  930  . EYE SURGERY Bilateral 02/13/2011   other 2013  . 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 TUBAL LIGATION    . VAGINAL HYSTERECTOMY N/A 05/02/2013   Procedure: HYSTERECTOMY VAGINAL;  Surgeon: [name], MD;  Location: [facility];  Service: Gynecology;  Laterality: N/A;    Social History   Tobacco Use  . Smoking status: Never Smoker  . Smokeless tobacco: Never Used  . Tobacco comment: smoking cessation materials not required  Substance Use Topics  . Alcohol use: No  . Drug use: No     Medication list has been reviewed and updated.  Current Meds  Medication Sig  . aspirin EC 81 MG tablet Take 81 mg by mouth at bedtime.   . Cholecalciferol (VITAMIN D HIGH POTENCY PO) Take by mouth.  . estradiol (ESTRACE) 0.1 MG/GM vaginal cream Place 1 Applicatorful vaginally at bedtime.  . hydrochlorothiazide (HYDRODIURIL) 25 MG tablet TAKE 1 TABLET DAILY  . metoprolol tartrate (LOPRESSOR) 25 MG tablet Take 1.5 tablets (37.5 mg total) by mouth 2 (two) times daily.  . ramipril (ALTACE) 10 MG capsule TAKE 1 CAPSULE DAILY  . simvastatin (ZOCOR) 10 MG tablet TAKE 1 TABLET DAILY    PHQ 2/9 Scores 10/04/2017 09/29/2016 05/01/2016 04/02/2015  PHQ - 2 Score 0 0 0 0  PHQ- 9 Score 0 - - -    Physical Exam  Constitutional: She appears well-developed and well-nourished.  Cardiovascular: Normal rate, regular rhythm and normal heart sounds.  Pulmonary/Chest: Effort normal and breath sounds normal. No respiratory distress.  Abdominal: Soft. Bowel sounds are normal. There is tenderness in the suprapubic area. There is no rebound, no guarding and no CVA tenderness.  Psychiatric: She has a normal mood and affect.  Nursing note and vitals reviewed.   BP 128/80   Pulse 60   Ht 5\' 2"  (1.575 m)   Wt 140 lb (63.5 kg)   BMI 25.61 kg/m   Assessment and Plan: 1. Acute cystitis without hematuria Increase fluids Will check on Culture  next week - POC urinalysis w microscopic (non auto) - Urine Culture - cephALEXin (KEFLEX) 500 MG capsule; Take 1 capsule (500 mg total) by mouth 4 (four) times daily for 7 days.  Dispense: 28 capsule; Refill: 0   Meds ordered this encounter  Medications  . cephALEXin (KEFLEX) 500 MG capsule    Sig: Take 1 capsule (500 mg total) by mouth 4 (four) times daily for 7 days.    Dispense:  28 capsule    Refill:  0    Partially dictated using Editor, commissioning. Any errors are unintentional.  Halina Maidens, MD [facility]  01/13/2018

## 2018-01-17 LAB — URINE CULTURE

## 2018-01-26 NOTE — Progress Notes (Signed)
01/27/2018 10:42 AM   Rebecca Gutierrez [birth year] [id number]  Referring provider: [name], MD [address] [place], [place] [address]  Chief Complaint  Patient presents with  . Urinary Tract Infection    HPI: Patient is a 77 year old Caucasian female with a history of recurrent UTI's who presents today for the symptoms of frequency and pain with urination.  She was recently seen by Dr. [name] for an staphylococcus epidermis that was resistant to ciprofloxacin and oxacillin.  She states that her symptoms consist of dysuria, urgency and cloudy urine.  Patient denies any gross hematuria, dysuria or suprapubic/flank pain.  Patient denies any fevers, chills, nausea or vomiting.   She was given Keflex 500 mg qid, but she found this excessive and did not take the antibiotic as prescribed.    She does not have a history of nephrolithiasis, GU surgery or GU trauma.    She is not sexually active.  She is post menopausal.   She denies constipation and/or diarrhea.   She does engage in good perineal hygiene. She does not take tub baths.   She does not have incontinence.  She is not having pain with bladder filling.    She is not drinking a lot of water daily.  She has one cup of coffee daily and occasional diet soda.  She admits to not drinking a lot of fluids during the day.    She states last night she started to experience cloudy urine and discomfort when urinating.  She took a Keflex this am.    . Her CATH UA >30 WBC's and many bacteria.  Her PVR was 50 mL.     PMH: Past Medical History:  Diagnosis Date  . Anxiety   . Cancer ([place])    skin ca  . Gilbert's syndrome   . Hearing loss 1975  . Hyperlipidemia 2000  . Hypertension 2000    Surgical History: Past Surgical History:  Procedure Laterality Date  . ABDOMINAL HYSTERECTOMY    . ANTERIOR AND POSTERIOR REPAIR N/A 05/02/2013   Procedure: ANTERIOR (CYSTOCELE) AND POSTERIOR REPAIR (RECTOCELE);   Surgeon: [name], MD;  Location: [facility];  Service: Gynecology;  Laterality: N/A;  . COLONOSCOPY  08/28/2011   Procedure: COLONOSCOPY;  Surgeon: [name], MD;  [facility];  Service: Endoscopy;  Laterality: N/A;  930  . EYE SURGERY Bilateral 02/13/2011   other 2013  . 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 TUBAL LIGATION    . VAGINAL HYSTERECTOMY N/A 05/02/2013   Procedure: HYSTERECTOMY VAGINAL;  Surgeon: [name], MD;  Location: [facility];  Service: Gynecology;  Laterality: N/A;    Home Medications:  Allergies as of 01/27/2018      Reactions   Levaquin [levofloxacin] Other (See Comments)   insomnia   [name] Diarrhea   [name]       Medication List        Accurate as of 01/27/18 10:42 AM. Always use your most recent med list.          aspirin EC 81 MG tablet Take 81 mg by mouth at bedtime.   estradiol 0.1 MG/GM vaginal cream Commonly known as:  ESTRACE Place 1 Applicatorful vaginally at bedtime.   hydrochlorothiazide 25 MG tablet Commonly known as:  HYDRODIURIL TAKE 1 TABLET DAILY   metoprolol tartrate 25 MG tablet Commonly known as:  LOPRESSOR Take 1.5 tablets (37.5 mg  total) by mouth 2 (two) times daily.   ramipril 10 MG capsule Commonly known as:  ALTACE TAKE 1 CAPSULE DAILY   simvastatin 10 MG tablet Commonly known as:  ZOCOR TAKE 1 TABLET DAILY   VITAMIN D HIGH POTENCY PO Take by mouth.       Allergies:  Allergies  Allergen Reactions  . Levaquin [Levofloxacin] Other (See Comments)    insomnia  . [name] Diarrhea  . [name]     Family History: Family History  Problem Relation Age of Onset  . Hypertension Mother   . Cancer Mother        stomach  . Healthy Father   . Healthy Brother   . Colon cancer Neg Hx   . Prostate cancer Neg Hx   . Kidney cancer Neg Hx   . Bladder Cancer Neg Hx   . Breast cancer Neg Hx     Social  History:  reports that she has never smoked. She has never used smokeless tobacco. She reports that she does not drink alcohol or use drugs.  ROS: UROLOGY Frequent Urination?: Yes Hard to postpone urination?: Yes Burning/pain with urination?: Yes Get up at night to urinate?: Yes Leakage of urine?: No Urine stream starts and stops?: No Trouble starting stream?: No Do you have to strain to urinate?: No Blood in urine?: No Urinary tract infection?: Yes Sexually transmitted disease?: No Injury to kidneys or bladder?: No Painful intercourse?: No Weak stream?: No Currently pregnant?: No Vaginal bleeding?: No Last menstrual period?: n  Gastrointestinal Nausea?: No Vomiting?: No Indigestion/heartburn?: No Diarrhea?: No Constipation?: No  Constitutional Fever: No Night sweats?: No Weight loss?: No Fatigue?: No  Skin Skin rash/lesions?: No Itching?: No  Eyes Blurred vision?: No Double vision?: No  Ears/Nose/Throat Sore throat?: No Sinus problems?: No  Hematologic/Lymphatic Swollen glands?: No Easy bruising?: No  Cardiovascular Leg swelling?: No Chest pain?: No  Respiratory Cough?: No Shortness of breath?: No  Endocrine Excessive thirst?: No  Musculoskeletal Back pain?: No Joint pain?: No  Neurological Headaches?: No Dizziness?: No  Psychologic Depression?: No Anxiety?: No  Physical Exam: BP (!) 146/74 (BP Location: Left Arm, Patient Position: Sitting, Cuff Size: Normal)   Pulse 63   Ht 5\' 2"  (1.575 m)   Wt 141 lb (64 kg)   BMI 25.79 kg/m   Constitutional: Well nourished. Alert and oriented, No acute distress. HEENT: [place] AT, moist mucus membranes. Trachea midline, no masses. Cardiovascular: No clubbing, cyanosis, or edema. Respiratory: Normal respiratory effort, no increased work of breathing. GI: Abdomen is soft, non tender, non distended, no abdominal masses. Liver and spleen not palpable.  No hernias appreciated.  Stool sample for occult  testing is not indicated.   GU: No CVA tenderness.  No bladder fullness or masses.  Atrophic external genitalia, normal pubic hair distribution, no lesions.  Normal urethral meatus, no lesions, no prolapse, no discharge.   Urethral caruncle.  No bladder fullness, tenderness or masses. Pale vagina mucosa, good estrogen effect, no discharge, no lesions, poor pelvic support, grade III cystocele.  Rectocele noted.  Cervix and uterus are surgically absent.  No adnexal/parametria masses or tenderness noted.  Anus and perineum are without rashes or lesions.    Skin: No rashes, bruises or suspicious lesions. Lymph: No cervical or inguinal adenopathy. Neurologic: Grossly intact, no focal deficits, moving all 4 extremities. Psychiatric: Normal mood and affect.  Laboratory Data: Lab Results  Component Value Date   WBC 7.4 10/19/2017   HGB 14.5 10/19/2017  HCT 42.6 10/19/2017   MCV 90 10/19/2017   PLT 246 10/19/2017    Lab Results  Component Value Date   CREATININE 0.79 10/19/2017    Lab Results  Component Value Date   HGBA1C 5.6 07/23/2011    Lab Results  Component Value Date   TSH 3.250 10/19/2017       Component Value Date/Time   CHOL 194 10/19/2017 1011   HDL 72 10/19/2017 1011   CHOLHDL 2.7 10/19/2017 1011   CHOLHDL 2.1 09/25/2015 0822   VLDL 13 09/25/2015 0822   LDLCALC 99 10/19/2017 1011    Lab Results  Component Value Date   AST 19 10/19/2017   Lab Results  Component Value Date   ALT 18 10/19/2017     Urinalysis CATH UA > 30 WBC's and many bacteria.   I have reviewed the labs.  Assessment & Plan:    1. Recurrent UTI's Reviewed UTI prevention techniques Strongly encouraged patient to drink more water CATH UA is suspicious for infection - will send in a prescription for Keflex and adjust when culture results are available I did discuss that it would be more prudent to wait on the results of the culture before prescribing an antibiotic, but the weekend is  coming up and the results will not be available for several days I did discuss antibiotic stewardship with the patient  2. Cystocele Patient has had a pessary in the past and she did not find them effective and she does not want to be fitted for a another one Asked the patient to tilt forward after urinating and an effort to completely empty her bladder  3. Vaginal atrophy Patient encouraged to apply the vaginal estrogen cream 3 nights weekly    Return in about 1 year (around 01/28/2019) for exam and PVR .  These notes generated with voice recognition software. I apologize for typographical errors.  Zara Council, PA-C  [facility] [address] [place], [place] [address] [phone]

## 2018-01-27 ENCOUNTER — Encounter: Payer: Self-pay | Admitting: Urology

## 2018-01-27 ENCOUNTER — Ambulatory Visit (INDEPENDENT_AMBULATORY_CARE_PROVIDER_SITE_OTHER): Payer: Medicare HMO | Admitting: Urology

## 2018-01-27 VITALS — BP 146/74 | HR 63 | Ht 62.0 in | Wt 141.0 lb

## 2018-01-27 DIAGNOSIS — Z87440 Personal history of urinary (tract) infections: Secondary | ICD-10-CM | POA: Diagnosis not present

## 2018-01-27 DIAGNOSIS — N8111 Cystocele, midline: Secondary | ICD-10-CM | POA: Diagnosis not present

## 2018-01-27 DIAGNOSIS — N952 Postmenopausal atrophic vaginitis: Secondary | ICD-10-CM

## 2018-01-27 DIAGNOSIS — A498 Other bacterial infections of unspecified site: Secondary | ICD-10-CM | POA: Diagnosis not present

## 2018-01-27 LAB — URINALYSIS, COMPLETE
Bilirubin, UA: NEGATIVE
GLUCOSE, UA: NEGATIVE
Ketones, UA: NEGATIVE
Nitrite, UA: NEGATIVE
PH UA: 5.5 (ref 5.0–7.5)
PROTEIN UA: NEGATIVE
Specific Gravity, UA: 1.01 (ref 1.005–1.030)
Urobilinogen, Ur: 0.2 mg/dL (ref 0.2–1.0)

## 2018-01-27 LAB — MICROSCOPIC EXAMINATION: WBC, UA: >30 /hpf — ABNORMAL HIGH (ref 0–5)

## 2018-01-27 MED ORDER — CEPHALEXIN 500 MG PO CAPS: 500.0000 mg | ORAL_CAPSULE | Freq: Two times a day (BID) | ORAL | 0 refills | Status: DC

## 2018-01-27 NOTE — Progress Notes (Signed)
In and Out Catheterization  Patient is present today for a I & O catheterization due to recurrent UTI. Patient was cleaned and prepped in a sterile fashion with betadine and Lidocaine 2% jelly was instilled into the urethra.  A 14FR cath was inserted no complications were noted , 11ml of urine return was noted, urine was pale yellow in color and cloudy. A clean urine sample was collected for ua and culture. Bladder was drained  And catheter was removed with out difficulty.    Preformed by: Sherril Cong, CMA (AAMA)

## 2018-01-30 LAB — CULTURE, URINE COMPREHENSIVE

## 2018-01-31 ENCOUNTER — Telehealth: Payer: Self-pay

## 2018-01-31 NOTE — Telephone Encounter (Signed)
-----   Message from Nori Riis, PA-C sent at 01/30/2018  7:37 PM EDT ----- Please let Rebecca Gutierrez know that her urine culture is negative.  She needs to discontinue the Keflex at this time.  If she is still having symptoms, she needs a return appointment.  If her symptoms have abated, I would like to see her in three months to check she is applying the vaginal estrogen cream three nights weekly.  Also, she should contact us with symptoms of an UTI.

## 2018-01-31 NOTE — Telephone Encounter (Signed)
Called pt informed her of negative cx and the need to d/c antibiotics. Pt voiced understanding, states that she is feeling somewhat better but would still like a f/u appt, scheduled pt for 02/16/18 @9 :45am. Also reiterated the need to use estrogen cream 3 times weekly. Pt gave verbal understanding.

## 2018-02-02 ENCOUNTER — Other Ambulatory Visit: Payer: Self-pay | Admitting: Internal Medicine

## 2018-02-02 DIAGNOSIS — I10 Essential (primary) hypertension: Secondary | ICD-10-CM

## 2018-02-02 DIAGNOSIS — R002 Palpitations: Secondary | ICD-10-CM

## 2018-02-07 ENCOUNTER — Telehealth: Payer: Self-pay | Admitting: Urology

## 2018-02-07 NOTE — Telephone Encounter (Signed)
Pt added to nurse schedule for UTI symptoms.

## 2018-02-08 ENCOUNTER — Ambulatory Visit: Payer: Medicare HMO

## 2018-02-08 DIAGNOSIS — Z87440 Personal history of urinary (tract) infections: Secondary | ICD-10-CM

## 2018-02-08 DIAGNOSIS — N8111 Cystocele, midline: Secondary | ICD-10-CM | POA: Diagnosis not present

## 2018-02-08 DIAGNOSIS — R351 Nocturia: Secondary | ICD-10-CM | POA: Diagnosis not present

## 2018-02-08 DIAGNOSIS — N390 Urinary tract infection, site not specified: Secondary | ICD-10-CM | POA: Diagnosis not present

## 2018-02-08 MED ORDER — NITROFURANTOIN MONOHYD MACRO 100 MG PO CAPS: 100.0000 mg | ORAL_CAPSULE | Freq: Two times a day (BID) | ORAL | 0 refills | Status: AC

## 2018-02-08 NOTE — Progress Notes (Signed)
Rebecca Gutierrez is present in office today for a nurse visit. She is complaining of urinary frequency, dysuria, and urinary urgency. Pt was seen by Zara Council on 01/27/18 for hx of recurrent uti. At that time pt provided a urine specimen for analysis and culture via clinic catheterization. Analysis was suspicious at office visit for infection, however culture resulted 01/31/18, indicating no infection present.   Pt provided a urine specimen today via clean catch,  analysis was performed, indicating positive for infection. Pt was started on Macrboid, bid, 7 days. Pt will follow up with [name] on 02/16/18.

## 2018-02-09 LAB — MICROSCOPIC EXAMINATION

## 2018-02-09 LAB — URINALYSIS, COMPLETE
BILIRUBIN UA: NEGATIVE
GLUCOSE, UA: NEGATIVE
KETONES UA: NEGATIVE
Nitrite, UA: POSITIVE — AB
PROTEIN UA: NEGATIVE
Urobilinogen, Ur: 0.2 mg/dL (ref 0.2–1.0)
pH, UA: 6 (ref 5.0–7.5)

## 2018-02-12 LAB — CULTURE, URINE COMPREHENSIVE

## 2018-02-16 ENCOUNTER — Ambulatory Visit: Payer: Medicare HMO | Admitting: Urology

## 2018-02-16 ENCOUNTER — Encounter: Payer: Self-pay | Admitting: Urology

## 2018-02-16 VITALS — BP 163/78 | HR 57 | Ht 67.0 in | Wt 140.0 lb

## 2018-02-16 DIAGNOSIS — N952 Postmenopausal atrophic vaginitis: Secondary | ICD-10-CM | POA: Diagnosis not present

## 2018-02-16 DIAGNOSIS — N8111 Cystocele, midline: Secondary | ICD-10-CM

## 2018-02-16 DIAGNOSIS — N390 Urinary tract infection, site not specified: Secondary | ICD-10-CM | POA: Diagnosis not present

## 2018-02-16 DIAGNOSIS — R351 Nocturia: Secondary | ICD-10-CM

## 2018-02-16 NOTE — Progress Notes (Addendum)
02/16/2018 10:23 AM   Rebecca Gutierrez [birth year] [id number]  Referring provider: [name], MD [address] [place], [place] [address]  Chief Complaint  Patient presents with  . Recurrent UTI    HPI: Patient is a 77 year old Caucasian female with a history of rUTI's who presents today for follow up.  Background history   Patient is a 77 year old Caucasian female with a history of recurrent UTI's who presents today for the symptoms of frequency and pain with urination.  She was recently seen by Dr. [name] for an staphylococcus epidermis that was resistant to ciprofloxacin and oxacillin.  She states that her symptoms consist of dysuria, urgency and cloudy urine.  Patient denies any gross hematuria, dysuria or suprapubic/flank pain.  Patient denies any fevers, chills, nausea or vomiting.  She was given Keflex 500 mg qid, but she found this excessive and did not take the antibiotic as prescribed.  She does not have a history of nephrolithiasis, GU surgery or GU trauma.  She is not sexually active.  She is post menopausal.  She denies constipation and/or diarrhea.  She does engage in good perineal hygiene. She does not take tub baths.  She does not have incontinence.  She is not having pain with bladder filling.  She is not drinking a lot of water daily.  She has one cup of coffee daily and occasional diet soda.  She admits to not drinking a lot of fluids during the day.   She states last night she started to experience cloudy urine and discomfort when urinating.  She took a Keflex this am.    . Her CATH on 01/27/2018 was positive for UA >30 WBC's and many bacteria.  Her PVR was 50 mL.  Urine culture was negative.  She returned to the office on 02/08/2018 with the complaints of frequency, urgency and dysuria.  A clean catch UA was positive for nitrite positive, 3-10 RBC's and 11-30 WBC's.  Urine culture was positive for Citrobacter and Klebsiella.  She was placed on  Macrobid.  Today, she is complaining of nocturia.  She is also experiencing some burning in the vaginal area.  She attributes to her cystocele being irritated.  Patient denies any gross hematuria, dysuria or suprapubic/flank pain.  Patient denies any fevers, chills, nausea or vomiting.   She does not feel as though she has an infection today.   PMH: Past Medical History:  Diagnosis Date  . Anxiety   . Cancer ([place])    skin ca  . Gilbert's syndrome   . Hearing loss 1975  . Hyperlipidemia 2000  . Hypertension 2000    Surgical History: Past Surgical History:  Procedure Laterality Date  . ABDOMINAL HYSTERECTOMY    . ANTERIOR AND POSTERIOR REPAIR N/A 05/02/2013   Procedure: ANTERIOR (CYSTOCELE) AND POSTERIOR REPAIR (RECTOCELE);  Surgeon: [name], MD;  Location: [facility];  Service: Gynecology;  Laterality: N/A;  . COLONOSCOPY  08/28/2011   Procedure: COLONOSCOPY;  Surgeon: [name], MD;  [facility];  Service: Endoscopy;  Laterality: N/A;  930  . EYE SURGERY Bilateral 02/13/2011   other 2013  . 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 TUBAL LIGATION    . VAGINAL HYSTERECTOMY N/A 05/02/2013   Procedure: HYSTERECTOMY VAGINAL;  Surgeon: [name], MD;  Location: [facility];  Service: Gynecology;  Laterality: N/A;    Home Medications:  Allergies as of  02/16/2018      Reactions   Levaquin [levofloxacin] Other (See Comments)   insomnia   [name] Diarrhea   [name]       Medication List        Accurate as of 02/16/18 10:23 AM. Always use your most recent med list.          aspirin EC 81 MG tablet Take 81 mg by mouth at bedtime.   estradiol 0.1 MG/GM vaginal cream Commonly known as:  ESTRACE Place 1 Applicatorful vaginally at bedtime.   hydrochlorothiazide 25 MG tablet Commonly known as:  HYDRODIURIL TAKE 1 TABLET DAILY   metoprolol tartrate 25 MG tablet Commonly known as:  LOPRESSOR TAKE ONE  AND ONE-HALF TABLETS TWICE A DAY   ramipril 10 MG capsule Commonly known as:  ALTACE TAKE 1 CAPSULE DAILY   simvastatin 10 MG tablet Commonly known as:  ZOCOR TAKE 1 TABLET DAILY   VITAMIN D HIGH POTENCY PO Take by mouth.       Allergies:  Allergies  Allergen Reactions  . Levaquin [Levofloxacin] Other (See Comments)    insomnia  . [name] Diarrhea  . [name]     Family History: Family History  Problem Relation Age of Onset  . Hypertension Mother   . Cancer Mother        stomach  . Healthy Father   . Healthy Brother   . Colon cancer Neg Hx   . Prostate cancer Neg Hx   . Kidney cancer Neg Hx   . Bladder Cancer Neg Hx   . Breast cancer Neg Hx     Social History:  reports that she has never smoked. She has never used smokeless tobacco. She reports that she does not drink alcohol or use drugs.  ROS: UROLOGY Frequent Urination?: No Hard to postpone urination?: No Burning/pain with urination?: No Get up at night to urinate?: Yes Leakage of urine?: No Urine stream starts and stops?: No Trouble starting stream?: No Do you have to strain to urinate?: No Blood in urine?: No Urinary tract infection?: Yes Sexually transmitted disease?: No Injury to kidneys or bladder?: No Painful intercourse?: No Weak stream?: No Currently pregnant?: No Vaginal bleeding?: No Last menstrual period?: n  Gastrointestinal Nausea?: No Vomiting?: No Indigestion/heartburn?: No Diarrhea?: No Constipation?: No  Constitutional Fever: No Night sweats?: No Weight loss?: No Fatigue?: No  Skin Skin rash/lesions?: No Itching?: No  Eyes Blurred vision?: No Double vision?: No  Ears/Nose/Throat Sore throat?: No Sinus problems?: No  Hematologic/Lymphatic Swollen glands?: No Easy bruising?: No  Cardiovascular Leg swelling?: No Chest pain?: No  Respiratory Cough?: No Shortness of breath?: No  Endocrine Excessive thirst?:  No  Musculoskeletal Back pain?: No Joint pain?: No  Neurological Headaches?: No Dizziness?: No  Psychologic Depression?: No Anxiety?: No  Physical Exam: BP (!) 163/78 (BP Location: Left Arm, Patient Position: Sitting)   Pulse (!) 57   Ht 5\' 7"  (1.702 m)   Wt 140 lb (63.5 kg)   BMI 21.93 kg/m   Constitutional: Well nourished. Alert and oriented, No acute distress. HEENT: [place] AT, moist mucus membranes. Trachea midline, no masses. Cardiovascular: No clubbing, cyanosis, or edema. Respiratory: Normal respiratory effort, no increased work of breathing. GI: Abdomen is soft, non tender, non distended, no abdominal masses. Liver and spleen not palpable.  No hernias appreciated.  Stool sample for occult testing is not indicated.   GU: No CVA tenderness.  No bladder fullness or masses.  Atrophic  external genitalia, normal pubic  hair distribution, no lesions.  Urethral caruncle noted.  No urethral masses, tenderness and/or tenderness. No bladder fullness, tenderness or masses.  Pale vagina mucosa, poor estrogen effect, no discharge, no lesions, poor pelvic support, Grade II cystocele, no rectocele noted.  Cervix, uterus and adnexa are surgically absent.  No adnexal/parametria masses or tenderness noted.  Anus and perineum are without rashes or lesions.    Skin: No rashes, bruises or suspicious lesions. Lymph: No cervical or inguinal adenopathy. Neurologic: Grossly intact, no focal deficits, moving all 4 extremities. Psychiatric: Normal mood and affect.  Laboratory Data: Lab Results  Component Value Date   WBC 7.4 10/19/2017   HGB 14.5 10/19/2017   HCT 42.6 10/19/2017   MCV 90 10/19/2017   PLT 246 10/19/2017    Lab Results  Component Value Date   CREATININE 0.79 10/19/2017    Lab Results  Component Value Date   HGBA1C 5.6 07/23/2011    Lab Results  Component Value Date   TSH 3.250 10/19/2017       Component Value Date/Time   CHOL 194 10/19/2017 1011   HDL 72  10/19/2017 1011   CHOLHDL 2.7 10/19/2017 1011   CHOLHDL 2.1 09/25/2015 0822   VLDL 13 09/25/2015 0822   LDLCALC 99 10/19/2017 1011    Lab Results  Component Value Date   AST 19 10/19/2017   Lab Results  Component Value Date   ALT 18 10/19/2017    I have reviewed the labs.  Assessment & Plan:    1. Recurrent UTI's Reviewed UTI prevention techniques Strongly encouraged patient to drink more water RUS to evaluate for possible nidus for recurrent UTIs Schedule cystoscopy for further evaluation of recurrent UTIs I have explained to the patient that they will  be scheduled for a cystoscopy in our office to evaluate their bladder.  The cystoscopy consists of passing a tube with a lens up through their urethra and into their urinary bladder.   We will inject the urethra with a lidocaine gel prior to introducing the cystoscope to help with any discomfort during the procedure.   After the procedure, they might experience blood in the urine and discomfort with urination.  This will abate after the first few voids.  I have  encouraged the patient to increase water intake  during this time.  Patient denies any allergies to lidocaine.    2. Cystocele Patient will have an appointment with Dr. Matilde Sprang for her cystoscopic he to discuss her cystocele further  3. Vaginal atrophy Patient to continue the vaginal estrogen cream 3 nights weekly  4. Nocturia  - I explained to the patient that nocturia is often multi-factorial and difficult to treat.  Sleeping disorders, heart conditions, peripheral vascular disease, diabetes, an enlarged prostate for men, an urethral stricture causing bladder outlet obstruction and/or certain medications can contribute to nocturia.  - I have suggested that the patient avoid caffeine after noon and alcohol in the evening.  He or she may also benefit from fluid restrictions after 6:00 in the evening and voiding just prior to bedtime.  - I have explained that research  studies have showed that over 84% of patients with sleep apnea reported frequent nighttime urination.   With sleep apnea, oxygen decreases, carbon dioxide increases, the blood become more acidic, the heart rate drops and blood vessels in the lung constrict.  The body is then alerted that something is very wrong. The sleeper must wake enough to reopen the airway. By this time, the heart is  racing and experiences a false signal of fluid overload. The heart excretes a hormone-like protein that tells the body to get rid of sodium and water, resulting in nocturia.  -  I also informed the patient that a recent study noted that decreasing sodium intake to 2.3 grams daily, if they don't have issues with hyponatremia, can also reduce the number of nightly voids  - There is also an increased incidence in sleep apnea with menopause, symptoms include night sweats, daytime sleepiness, depressed mood, and cognitive complaints like poor concentration or problems with short-term memory   - The patient may benefit from a discussion with his or her primary care physician to see if he or she has risk factors for sleep apnea or other sleep disturbances and obtaining a sleep study.    Return for Cystoscopy Dr. [place] Bali for recurrent UTIs.  These notes generated with voice recognition software. I apologize for typographical errors.  Zara Council, PA-C  [facility] [address] [place], [place] [address] [phone]

## 2018-03-02 ENCOUNTER — Ambulatory Visit
Admission: RE | Admit: 2018-03-02 | Discharge: 2018-03-02 | Disposition: A | Discharge status: Home / Self Care | Payer: Medicare HMO | Source: Ambulatory Visit | Attending: Urology | Admitting: Urology

## 2018-03-02 DIAGNOSIS — Z87440 Personal history of urinary (tract) infections: Secondary | ICD-10-CM | POA: Insufficient documentation

## 2018-03-02 DIAGNOSIS — N390 Urinary tract infection, site not specified: Secondary | ICD-10-CM | POA: Diagnosis not present

## 2018-03-07 ENCOUNTER — Ambulatory Visit (INDEPENDENT_AMBULATORY_CARE_PROVIDER_SITE_OTHER): Payer: Medicare HMO | Admitting: Urology

## 2018-03-07 ENCOUNTER — Encounter: Payer: Self-pay | Admitting: Urology

## 2018-03-07 VITALS — BP 156/93 | HR 80 | Ht 62.0 in | Wt 140.0 lb

## 2018-03-07 DIAGNOSIS — N390 Urinary tract infection, site not specified: Secondary | ICD-10-CM | POA: Diagnosis not present

## 2018-03-07 DIAGNOSIS — N1330 Unspecified hydronephrosis: Secondary | ICD-10-CM

## 2018-03-07 MED ORDER — TRIMETHOPRIM 100 MG PO TABS: 100.0000 mg | ORAL_TABLET | Freq: Every day | ORAL | 11 refills | Status: DC

## 2018-03-07 NOTE — Addendum Note (Signed)
Addended by: Tommy Rainwater on: 03/07/2018 03:32 PM   Modules accepted: Orders

## 2018-03-07 NOTE — Progress Notes (Signed)
03/07/2018 3:10 PM   Rebecca Gutierrez 11/13/1940 [id number]  Referring provider: [name], Gutierrez [address] [place],  [address]  Chief Complaint  Patient presents with  . Cysto    HPI: Rebecca Gutierrez: Patient is a 77 year old Caucasian female with a history of recurrent UTI's who presents today for the symptoms of frequency and pain with urination.  She was recently seen by Dr. [name] for an staphylococcus epidermis that was resistant to ciprofloxacin and oxacillin.  She states that her symptoms consist of dysuria, urgency and cloudy urine.  Postvoid residual was 50 mL.  Subsequent urine cultures positive.  She is noted to have a cystocele.  She was on vaginal estrogen cream.  She was educated about nocturia  Today Clinically not infected today.  Continues to have frequency both day and night.  She can feel some vaginal bulging but she was actually quite nonspecific about it and has been chronic.  Ultrasound demonstrated fullness of the right renal pelvis.  There was a question with her or not it could be an extrarenal pelvis.  On pelvic examination she had a small grade 3 cystocele with moderate central defect that just reached the introitus.  She did not bear down well but her vaginal cuff descended from 8 or 9 cm to approximately 6 cm.  She had no rectocele.  She had mild hypermobility the bladder neck and no stress incontinence with a cystocele reduced.  She had mild vaginal shortening  Stage III: Patient underwent flexible cystoscopy utilizing sterile technique after consent.  Bladder mucosa and trigone were normal.  There is no foreign body.  Excellent reflux bilaterally.  Moderate cystocele noted   PMH: Past Medical History:  Diagnosis Date  . Anxiety   . Cancer ([place])    skin ca  . Gilbert's syndrome   . Hearing loss 1975  . Hyperlipidemia 2000  . Hypertension 2000    Surgical History: Past Surgical History:  Procedure Laterality Date  .  ABDOMINAL HYSTERECTOMY    . ANTERIOR AND POSTERIOR REPAIR N/A 05/02/2013   Procedure: ANTERIOR (CYSTOCELE) AND POSTERIOR REPAIR (RECTOCELE);  Surgeon: [name], Gutierrez;  Location: [facility];  Service: Gynecology;  Laterality: N/A;  . COLONOSCOPY  08/28/2011   Procedure: COLONOSCOPY;  Surgeon: [name], Gutierrez;  Location: [facility];  Service: Endoscopy;  Laterality: N/A;  930  . EYE SURGERY Bilateral 02/13/2011   other 2013  . 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 TUBAL LIGATION    . VAGINAL HYSTERECTOMY N/A 05/02/2013   Procedure: HYSTERECTOMY VAGINAL;  Surgeon: [name], Gutierrez;  Location: [facility];  Service: Gynecology;  Laterality: N/A;    Home Medications:  Allergies as of 03/07/2018      Reactions   Levaquin [levofloxacin] Other (See Comments)   insomnia   [name] Diarrhea   [name]       Medication List        Accurate as of 03/07/18  3:10 PM. Always use your most recent med list.          aspirin EC 81 MG tablet Take 81 mg by mouth at bedtime.   estradiol 0.1 MG/GM vaginal cream Commonly known as:  ESTRACE Place 1 Applicatorful vaginally at bedtime.   hydrochlorothiazide 25 MG tablet Commonly known as:  HYDRODIURIL TAKE 1 TABLET DAILY   metoprolol tartrate 25 MG tablet Commonly known as:  LOPRESSOR TAKE ONE AND ONE-HALF  TABLETS TWICE A DAY   ramipril 10 MG capsule Commonly known as:  ALTACE TAKE 1 CAPSULE DAILY   simvastatin 10 MG tablet Commonly known as:  ZOCOR TAKE 1 TABLET DAILY   VITAMIN D HIGH POTENCY PO Take by mouth.       Allergies:  Allergies  Allergen Reactions  . Levaquin [Levofloxacin] Other (See Comments)    insomnia  . [name] Diarrhea  . [name]     Family History: Family History  Problem Relation Age of Onset  . Hypertension Mother   . Cancer Mother        stomach  . Healthy Father   . Healthy Brother   . Colon cancer Neg Hx     . Prostate cancer Neg Hx   . Kidney cancer Neg Hx   . Bladder Cancer Neg Hx   . Breast cancer Neg Hx     Social History:  reports that she has never smoked. She has never used smokeless tobacco. She reports that she does not drink alcohol or use drugs.  ROS:                                        Physical Exam: BP (!) 156/93   Pulse 80   Ht 5\' 2"  (1.575 m)   Wt 63.5 kg   BMI 25.61 kg/m    Laboratory Data: Lab Results  Component Value Date   WBC 7.4 10/19/2017   HGB 14.5 10/19/2017   HCT 42.6 10/19/2017   MCV 90 10/19/2017   PLT 246 10/19/2017    Lab Results  Component Value Date   CREATININE 0.79 10/19/2017    No results found for: PSA  No results found for: TESTOSTERONE  Lab Results  Component Value Date   HGBA1C 5.6 07/23/2011    Urinalysis    Component Value Date/Time   COLORURINE YELLOW 05/29/2016 0954   APPEARANCEUR Cloudy (A) 02/08/2018 1517   LABSPEC 1.015 05/29/2016 0954   PHURINE 6.0 05/29/2016 0954   GLUCOSEU Negative 02/08/2018 1517   HGBUR NEGATIVE 05/29/2016 0954   BILIRUBINUR Negative 02/08/2018 1517   KETONESUR NEGATIVE 05/29/2016 0954   PROTEINUR Negative 02/08/2018 1517   PROTEINUR NEGATIVE 05/29/2016 0954   UROBILINOGEN 0.2 01/13/2018 1147   UROBILINOGEN 0.2 05/02/2013 0930   NITRITE Positive (A) 02/08/2018 1517   NITRITE NEGATIVE 05/29/2016 0954   LEUKOCYTESUR 2+ (A) 02/08/2018 1517    Pertinent Imaging:   Assessment & Plan: The patient has recurrent bladder infections.  I think it is best to get a CT scan for the potential of right hydronephrosis.  I will call if the report differs.  I recommended daily prophylaxis and reassess in 2 months on daily trimethoprim.  Watchful waiting I believe at least in the short-term a very good option for her chronic mild cystocele  Patient will have CT scan in 2 weeks to recheck right kidney and I will call if normal.  She was started on trimethoprim.  I will reassess  in 8 weeks and see if it down regulates bothersome nighttime frequency.  I found that she jumped around some from one topic to another  1. Recurrent UTI  - Urinalysis, Complete - CULTURE, URINE COMPREHENSIVE   No follow-ups on file.  Reece Packer, Gutierrez  [facility] [address], [place], [place] [address] [phone]

## 2018-03-08 LAB — MICROSCOPIC EXAMINATION: RBC, UA: NONE SEEN /hpf (ref 0–2)

## 2018-03-08 LAB — URINALYSIS, COMPLETE
Bilirubin, UA: NEGATIVE
Glucose, UA: NEGATIVE
Ketones, UA: NEGATIVE
Nitrite, UA: NEGATIVE
PH UA: 7 (ref 5.0–7.5)
Protein, UA: NEGATIVE
RBC UA: NEGATIVE
Specific Gravity, UA: 1.015 (ref 1.005–1.030)
Urobilinogen, Ur: 0.2 mg/dL (ref 0.2–1.0)

## 2018-03-09 LAB — CULTURE, URINE COMPREHENSIVE

## 2018-03-14 ENCOUNTER — Telehealth: Payer: Self-pay

## 2018-03-14 MED ORDER — NITROFURANTOIN MONOHYD MACRO 100 MG PO CAPS: 100.0000 mg | ORAL_CAPSULE | Freq: Two times a day (BID) | ORAL | 0 refills | Status: DC

## 2018-03-14 NOTE — Telephone Encounter (Signed)
I have notified pt and I have sent in rx to pharmacy. Per Dr. Matilde Sprang okay to send rx as Macrobid.

## 2018-03-14 NOTE — Telephone Encounter (Signed)
-----   Message from Bjorn Loser, MD sent at 03/14/2018 12:59 PM EDT ----- Macrodantin 100 mg twice a day for 1 week; then restart daily trimethoprim    ----- Message ----- From: Garnette Gunner, CMA Sent: 03/09/2018   4:46 PM EDT To: Bjorn Loser, MD    ----- Message ----- From: Interface, [facility] Lab Results In Sent: 03/08/2018   5:39 AM EDT To: Rowe Robert Clinical

## 2018-03-16 ENCOUNTER — Other Ambulatory Visit
Admission: RE | Admit: 2018-03-16 | Discharge: 2018-03-16 | Disposition: A | Discharge status: Home / Self Care | Payer: Medicare HMO | Source: Ambulatory Visit | Attending: Urology | Admitting: Urology

## 2018-03-16 ENCOUNTER — Other Ambulatory Visit: Payer: Self-pay

## 2018-03-16 ENCOUNTER — Ambulatory Visit
Admission: RE | Admit: 2018-03-16 | Discharge: 2018-03-16 | Disposition: A | Discharge status: Home / Self Care | Payer: Medicare HMO | Source: Ambulatory Visit | Attending: Urology | Admitting: Urology

## 2018-03-16 DIAGNOSIS — N390 Urinary tract infection, site not specified: Secondary | ICD-10-CM

## 2018-03-16 DIAGNOSIS — N8189 Other female genital prolapse: Secondary | ICD-10-CM | POA: Diagnosis not present

## 2018-03-16 DIAGNOSIS — Q625 Duplication of ureter: Secondary | ICD-10-CM | POA: Diagnosis not present

## 2018-03-16 DIAGNOSIS — Z87440 Personal history of urinary (tract) infections: Secondary | ICD-10-CM | POA: Insufficient documentation

## 2018-03-16 DIAGNOSIS — N1330 Unspecified hydronephrosis: Secondary | ICD-10-CM

## 2018-03-16 DIAGNOSIS — N281 Cyst of kidney, acquired: Secondary | ICD-10-CM | POA: Insufficient documentation

## 2018-03-16 DIAGNOSIS — K449 Diaphragmatic hernia without obstruction or gangrene: Secondary | ICD-10-CM | POA: Insufficient documentation

## 2018-03-16 DIAGNOSIS — N289 Disorder of kidney and ureter, unspecified: Secondary | ICD-10-CM | POA: Insufficient documentation

## 2018-03-16 DIAGNOSIS — I700 Atherosclerosis of aorta: Secondary | ICD-10-CM | POA: Diagnosis not present

## 2018-03-16 LAB — CREATININE, SERUM
Creatinine, Ser: 0.75 mg/dL (ref 0.44–1.00)
GFR calc Af Amer: >60 mL/min (ref >60)
GFR calc non Af Amer: >60 mL/min (ref >60)

## 2018-03-16 LAB — BUN: BUN: 19 mg/dL (ref 8–23)

## 2018-03-16 MED ORDER — IOHEXOL 300 MG/ML  SOLN
150.0000 mL | Freq: Once | INTRAMUSCULAR | Status: AC | PRN
  Administered 2018-03-16: 150 mL via INTRAVENOUS

## 2018-03-30 ENCOUNTER — Encounter: Payer: Self-pay | Admitting: Urology

## 2018-03-30 ENCOUNTER — Ambulatory Visit: Payer: Medicare HMO | Admitting: Urology

## 2018-03-30 VITALS — BP 156/81 | HR 64 | Ht 62.0 in | Wt 140.0 lb

## 2018-03-30 DIAGNOSIS — Z87440 Personal history of urinary (tract) infections: Secondary | ICD-10-CM

## 2018-03-30 DIAGNOSIS — N8111 Cystocele, midline: Secondary | ICD-10-CM

## 2018-03-30 DIAGNOSIS — N952 Postmenopausal atrophic vaginitis: Secondary | ICD-10-CM

## 2018-03-30 DIAGNOSIS — R351 Nocturia: Secondary | ICD-10-CM | POA: Diagnosis not present

## 2018-03-30 DIAGNOSIS — R350 Frequency of micturition: Secondary | ICD-10-CM | POA: Diagnosis not present

## 2018-03-30 DIAGNOSIS — N289 Disorder of kidney and ureter, unspecified: Secondary | ICD-10-CM | POA: Diagnosis not present

## 2018-03-30 LAB — URINALYSIS, COMPLETE
Bilirubin, UA: NEGATIVE
GLUCOSE, UA: NEGATIVE
Ketones, UA: NEGATIVE
Nitrite, UA: POSITIVE — AB
PH UA: 6 (ref 5.0–7.5)
PROTEIN UA: NEGATIVE
Specific Gravity, UA: 1.01 (ref 1.005–1.030)
UUROB: 0.2 mg/dL (ref 0.2–1.0)

## 2018-03-30 LAB — MICROSCOPIC EXAMINATION
Epithelial Cells (non renal): NONE SEEN /hpf (ref 0–10)
RBC, UA: NONE SEEN /hpf (ref 0–2)

## 2018-03-30 NOTE — Progress Notes (Signed)
In and Out Catheterization  Patient is present today for a I & O catheterization due to urinary frequency. Patient was cleaned and prepped in a sterile fashion with betadine and Lidocaine 2% jelly was instilled into the urethra.  A 14FR cath was inserted no complications were noted , 134ml of urine return was noted, urine was yellow in color. A clean urine sample was collected for UA. Bladder was drained  And catheter was removed with out difficulty.    Preformed by: KDM cma

## 2018-03-30 NOTE — Progress Notes (Signed)
03/30/2018 9:21 PM   Analys Ryden Zinni 02-01-1941 [id number]  Referring provider: [name], MD [address] [place], [place] [address]  Chief Complaint  Patient presents with  . Recurrent UTI    follow up    HPI: Patient is a 77 year old Caucasian female with a history of rUTI's who presents today to review her CT scan results and she thinks she has another UTI.  rUTI's Risk factors: age, vaginal atrophy and limited fluid intake.  RUS on 03/02/2018 revealed mild right-sided hydronephrosis versus extrarenal pelvis. There is no dilation of the calices.  Extrarenal pelvis of the left kidney.  No other focal abnormality. Cysto on March 07, 2018 was negative.  CT w/wo on 03/16/2018 revealed no findings to explain the patient's history of recurrent UTI.  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  No right-sided hydronephrosis. Appearance on recent ultrasound was due to prominent central sinus cysts in both kidneys. 3. 8 mm low-density lesion in the interpolar left kidney cannot be definitively characterized but has attenuation higher than would be expected for a simple cyst. This may be a cyst complicated by proteinaceous debris or hemorrhage. Follow-up could be used to ensure stability. MRI may be able to definitively characterize this lesion and preclude the need for additional follow-up imaging.  Small hiatal hernia.  Pelvic floor laxity.  Aortic Atherosclerois   She is complaining of frequency and nocturia.  Patient denies any gross hematuria, dysuria or suprapubic/flank pain.  Patient denies any fevers, chills, nausea or vomiting.  CATH UA is positive for nitrates, 11-30 WBCs and many bacteria.  PVR is 150 mL.     Vaginal atrophy She is using the vaginal estrogen cream 3 nights weekly.      PMH: Past Medical History:  Diagnosis Date  . Anxiety   . Cancer ([place])    skin ca  . Gilbert's  syndrome   . Hearing loss 1975  . Hyperlipidemia 2000  . Hypertension 2000    Surgical History: Past Surgical History:  Procedure Laterality Date  . ABDOMINAL HYSTERECTOMY    . ANTERIOR AND POSTERIOR REPAIR N/A 05/02/2013   Procedure: ANTERIOR (CYSTOCELE) AND POSTERIOR REPAIR (RECTOCELE);  Surgeon: [name], MD;  Location: [facility];  Service: Gynecology;  Laterality: N/A;  . COLONOSCOPY  08/28/2011   Procedure: COLONOSCOPY;  Surgeon: [name], MD;  [facility];  Service: Endoscopy;  Laterality: N/A;  930  . EYE SURGERY Bilateral 02/13/2011   other 2013  . 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 TUBAL LIGATION    . VAGINAL HYSTERECTOMY N/A 05/02/2013   Procedure: HYSTERECTOMY VAGINAL;  Surgeon: [name], MD;  Location: [facility];  Service: Gynecology;  Laterality: N/A;    Home Medications:  Allergies as of 03/30/2018      Reactions   Levaquin [levofloxacin] Other (See Comments)   insomnia   [name] Diarrhea   [name]       Medication List        Accurate as of 03/30/18 11:59 PM. Always use your most recent med list.          aspirin EC 81 MG tablet Take 81 mg by mouth at bedtime.   estradiol 0.1 MG/GM vaginal cream Commonly known as:  ESTRACE Place 1 Applicatorful vaginally at bedtime.   hydrochlorothiazide 25 MG tablet Commonly known as:  HYDRODIURIL TAKE 1 TABLET DAILY   metoprolol tartrate 25 MG tablet Commonly known as:  LOPRESSOR TAKE ONE AND ONE-HALF TABLETS TWICE A DAY   ramipril 10 MG capsule Commonly known as:  ALTACE TAKE 1 CAPSULE DAILY   simvastatin 10 MG tablet Commonly known as:  ZOCOR TAKE 1 TABLET DAILY   VITAMIN D HIGH POTENCY PO Take by mouth.       Allergies:  Allergies  Allergen Reactions  . Levaquin [Levofloxacin] Other (See Comments)    insomnia  . [name] Diarrhea  . [name]     Family History: Family  History  Problem Relation Age of Onset  . Hypertension Mother   . Cancer Mother        stomach  . Healthy Father   . Healthy Brother   . Colon cancer Neg Hx   . Prostate cancer Neg Hx   . Kidney cancer Neg Hx   . Bladder Cancer Neg Hx   . Breast cancer Neg Hx     Social History:  reports that she has never smoked. She has never used smokeless tobacco. She reports that she does not drink alcohol or use drugs.  ROS: UROLOGY Frequent Urination?: Yes Hard to postpone urination?: No Burning/pain with urination?: No Get up at night to urinate?: Yes Leakage of urine?: No Urine stream starts and stops?: No Trouble starting stream?: No Do you have to strain to urinate?: No Blood in urine?: No Urinary tract infection?: Yes Sexually transmitted disease?: No Injury to kidneys or bladder?: No Painful intercourse?: No Weak stream?: No Currently pregnant?: No Vaginal bleeding?: No Last menstrual period?: n  Gastrointestinal Nausea?: No Vomiting?: No Indigestion/heartburn?: No Diarrhea?: No Constipation?: No  Constitutional Fever: No Night sweats?: No Weight loss?: No  Skin Skin rash/lesions?: No Itching?: No  Eyes Double vision?: No  Ears/Nose/Throat Sore throat?: No Sinus problems?: No  Hematologic/Lymphatic Swollen glands?: No Easy bruising?: No  Cardiovascular Leg swelling?: No Chest pain?: No  Respiratory Shortness of breath?: No  Endocrine Excessive thirst?: No  Musculoskeletal Back pain?: No Joint pain?: No  Neurological Headaches?: No  Psychologic Depression?: No Anxiety?: No  Physical Exam: BP (!) 156/81 (BP Location: Left Arm, Patient Position: Sitting, Cuff Size: Normal)   Pulse 64   Ht 5\' 2"  (1.575 m)   Wt 140 lb (63.5 kg)   BMI 25.61 kg/m   Constitutional: Well nourished. Alert and oriented, No acute distress. HEENT: [place] AT, moist mucus membranes. Trachea midline, no masses. Cardiovascular: No clubbing, cyanosis, or  edema. Respiratory: Normal respiratory effort, no increased work of breathing. Skin: No rashes, bruises or suspicious lesions. Lymph: No cervical or inguinal adenopathy. Neurologic: Grossly intact, no focal deficits, moving all 4 extremities. Psychiatric: Normal mood and affect.  Laboratory Data: Lab Results  Component Value Date   WBC 7.4 10/19/2017   HGB 14.5 10/19/2017   HCT 42.6 10/19/2017   MCV 90 10/19/2017   PLT 246 10/19/2017    Lab Results  Component Value Date   CREATININE 0.75 03/16/2018    Lab Results  Component Value Date   HGBA1C 5.6 07/23/2011    Lab Results  Component Value Date   TSH 3.250 10/19/2017       Component Value Date/Time   CHOL 194 10/19/2017 1011   HDL 72 10/19/2017 1011   CHOLHDL 2.7 10/19/2017 1011   CHOLHDL 2.1 09/25/2015 0822   VLDL 13 09/25/2015 0822   LDLCALC 99 10/19/2017 1011    Lab Results  Component Value  Date   AST 19 10/19/2017   Lab Results  Component Value Date   ALT 18 10/19/2017   Urinalysis CATH UA nitrite positive, 11-30 WBC's and many bacteria.  See Epic.  I have reviewed the labs.  Assessment & Plan:    1. Recurrent UTI's Reviewed UTI preventative strategies Encouraged patient to increase her water intake CATH UA is suspicious for infection, will send for culture, will hold on prescribing antibiotic until culture results are available She will discontinue her trimethoprim daily at this time  2. Cystocele Continue to monitor conservatively  3. Vaginal atrophy Patient to continue the vaginal estrogen cream 3 nights weekly  4. Nocturia Discussed reducing fluid intake and caffeine intake prior to bedtime  5. Left renal mass Discussed with the patient that we could further evaluate this lesion with an MRI or she return in 3 months and we can obatin a renal ultrasound at that time RTC in 3 months for RUS   Return in about 3 months (around 06/30/2018) for follow up RUS .  These notes generated with  voice recognition software. I apologize for typographical errors.  Zara Council, PA-C  [facility] [address] [place], [place] [address] [phone]

## 2018-04-03 LAB — CULTURE, URINE COMPREHENSIVE

## 2018-04-04 ENCOUNTER — Telehealth: Payer: Self-pay | Admitting: Family Medicine

## 2018-04-04 MED ORDER — AMOXICILLIN-POT CLAVULANATE 875-125 MG PO TABS: 1.0000 | ORAL_TABLET | Freq: Two times a day (BID) | ORAL | 0 refills | Status: DC

## 2018-04-04 NOTE — Telephone Encounter (Signed)
-----   Message from Nori Riis, PA-C sent at 04/04/2018  7:45 AM EDT ----- Please let Rebecca Gutierrez know that her urine culture was positive for infection.  She needs to start Augmentin 875/125, twice daily for 7 days.

## 2018-04-04 NOTE — Telephone Encounter (Signed)
Patient notified RX sent to pharmacy.

## 2018-04-04 NOTE — Telephone Encounter (Signed)
-----   Message from Nori Riis, PA-C sent at 04/04/2018  7:45 AM EDT ----- Please let Mrs. Sonn know that her urine culture was positive for infection.  She needs to start Augmentin 875/125, twice daily for 7 days.

## 2018-04-06 ENCOUNTER — Telehealth: Payer: Self-pay | Admitting: Urology

## 2018-04-06 NOTE — Telephone Encounter (Signed)
Cephalexin is not on Culture list. Please advise

## 2018-04-06 NOTE — Telephone Encounter (Signed)
Pt states her RX (Amoxcillin) that she started on Monday is not helping.  She wants to know if we can change her RX to Cephalexin 500 mg.  [facility] uses [facility] in [place].  Please give pt a call [phone]

## 2018-04-07 MED ORDER — CEPHALEXIN 500 MG PO CAPS: 500.0000 mg | ORAL_CAPSULE | Freq: Two times a day (BID) | ORAL | 0 refills | Status: DC

## 2018-04-07 NOTE — Telephone Encounter (Signed)
That is fine to discontinue the Augmentin and change her prescription to cephalexin 500 mg twice daily for 7 days.

## 2018-04-07 NOTE — Telephone Encounter (Signed)
Patient notified and will get new RX

## 2018-04-11 ENCOUNTER — Ambulatory Visit (INDEPENDENT_AMBULATORY_CARE_PROVIDER_SITE_OTHER): Payer: Medicare HMO | Admitting: Obstetrics & Gynecology

## 2018-04-11 ENCOUNTER — Encounter: Payer: Self-pay | Admitting: Obstetrics & Gynecology

## 2018-04-11 VITALS — BP 130/80 | Ht 62.0 in | Wt 143.0 lb

## 2018-04-11 DIAGNOSIS — N390 Urinary tract infection, site not specified: Secondary | ICD-10-CM

## 2018-04-11 DIAGNOSIS — N8111 Cystocele, midline: Secondary | ICD-10-CM | POA: Diagnosis not present

## 2018-04-11 NOTE — Progress Notes (Signed)
Consultant: [facility], Zara Council, PA-C Consultation Reason: Cystocele and Recurrent UTI  Cystocele/Rectocele Patient is a 77 yo WF who complains of a cystocele. Problem started several months ago. Symptoms include: recurrent UTI that has significantly worsened over the last few mos.  She sees a bulge but has min pain, discomfort, and rare LOU.  She has nocturia, but no so much urgency or frequency during the day.  Not sexually active.. Symptoms have been bothersome w the UTI's despite multiple medical therapies, [name] and CT, and urology consults.  Prior pessary use 13 years ago but did not like then (was sexually active, and was tried on cup type she was to take out nightly).   PMHx: She  has a past medical history of Anxiety, Cancer ([place]), Gilbert's syndrome, Hearing loss (1975), Hyperlipidemia (2000), and Hypertension (2000). Also,  has a past surgical history that includes Colonoscopy (08/28/2011); Vaginal hysterectomy (N/A, 05/02/2013); Anterior and posterior repair (N/A, 05/02/2013); Eye surgery (Bilateral, 02/13/2011); Tubal ligation; Mohs surgery (06/2016); and Abdominal hysterectomy., family history includes Cancer in her mother; Healthy in her brother and father; Hypertension in her mother.,  reports that she has never smoked. She has never used smokeless tobacco. She reports that she does not drink alcohol or use drugs.  She has a current medication list which includes the following prescription(s): amoxicillin-clavulanate, aspirin ec, cephalexin, cholecalciferol, estradiol, hydrochlorothiazide, metoprolol tartrate, ramipril, and simvastatin. Also, is allergic to levaquin [levofloxacin]; omnicef [cefdinir]; and sulfamethoxazole-trimethoprim.  Review of Systems  Constitutional: Negative for chills, fever and malaise/fatigue.  HENT: Negative for congestion, sinus pain and sore throat.   Eyes: Negative for blurred vision and pain.  Respiratory: Negative for cough and wheezing.     Cardiovascular: Negative for chest pain and leg swelling.  Gastrointestinal: Negative for abdominal pain, constipation, diarrhea, heartburn, nausea and vomiting.  Genitourinary: Positive for dysuria. Negative for frequency, hematuria and urgency.  Musculoskeletal: Negative for back pain, joint pain, myalgias and neck pain.  Skin: Negative for itching and rash.  Neurological: Negative for dizziness, tremors and weakness.  Endo/Heme/Allergies: Does not bruise/bleed easily.  Psychiatric/Behavioral: Negative for depression. The patient is not nervous/anxious and does not have insomnia.    Objective: BP 130/80   Ht 5\' 2"  (1.575 m)   Wt 143 lb (64.9 kg)   BMI 26.16 kg/m  Physical Exam  Constitutional: She is oriented to person, place, and time. She appears well-developed and well-nourished. No distress.  Genitourinary: Vagina normal. Pelvic exam was performed with patient supine. There is no rash, tenderness or lesion on the right labia. There is no rash, tenderness or lesion on the left labia. No erythema or bleeding in the vagina.  Genitourinary Comments: Cuff intact/ no lesions  Absent uterus and cervix  HENT:  Head: Normocephalic and atraumatic.  Nose: Nose normal.  Mouth/Throat: Oropharynx is clear and moist.  Abdominal: Soft. She exhibits no distension. There is no tenderness.  Musculoskeletal: Normal range of motion.  Neurological: She is alert and oriented to person, place, and time. No cranial nerve deficit.  Skin: Skin is warm and dry.  Psychiatric: She has a normal mood and affect.   ASSESSMENT/PLAN:   Problem List Items Addressed This Visit      Genitourinary   Recurrent UTI   Cystocele, midline - Primary    Pessary Fitting Patient presents for a pessary fitting. She desires a pessary as her means of controlling her symptoms of prolapse and/or urinary incontinence. She understands the care needed for a pessary and desires to  proceed. Alternative treatment options have  been discussed at length and the patient voices an understanding of each option.   PROCEDURE: The patient was placed in dorsal lithotomy position. Examination confirmed prolapse. A 1 ring pessary was fitted without difficulty. The patient subsequently ambulated, voided and performed valsalva maneuvers without dislodging the pessary and without discomfort. Care instructions were provided. Patient was discharged to home in stable condition. Will order a #1 Ring w Support and place once arrives.  Surgery alternative option also d/w pt.    Consideration for Anterior colporrhaphy counseled to pt.  Barnett Applebaum, MD, [facility], [facility] 04/11/2018  12:11 PM

## 2018-04-12 ENCOUNTER — Telehealth: Payer: Self-pay

## 2018-04-12 NOTE — Telephone Encounter (Signed)
Can you call pt and schedule pessary with [facility] this week, let pt know her pessary is here

## 2018-04-12 NOTE — Telephone Encounter (Signed)
Patient is schedule 04/25/18 at 10;20 with [facility]

## 2018-04-15 ENCOUNTER — Ambulatory Visit: Payer: Medicare HMO | Admitting: Obstetrics & Gynecology

## 2018-04-15 ENCOUNTER — Encounter: Payer: Self-pay | Admitting: Obstetrics & Gynecology

## 2018-04-15 VITALS — BP 140/80 | Ht 62.0 in | Wt 142.0 lb

## 2018-04-15 DIAGNOSIS — N8111 Cystocele, midline: Secondary | ICD-10-CM | POA: Diagnosis not present

## 2018-04-15 DIAGNOSIS — N390 Urinary tract infection, site not specified: Secondary | ICD-10-CM

## 2018-04-15 NOTE — Progress Notes (Signed)
  HPI:      Rebecca Gutierrez is a 77 y.o. [id number] who presents today for her pessary placement, and examination related to her pelvic floor weakening.  Pt has recurrent UTI and cystocele and desires attempts to minimize infections.  She sees a bulge but has min pain, discomfort, and rare LOU.  PMHx: She  has a past medical history of Anxiety, Cancer ([place]), Gilbert's syndrome, Hearing loss (1975), Hyperlipidemia (2000), and Hypertension (2000). Also,  has a past surgical history that includes Colonoscopy (08/28/2011); Vaginal hysterectomy (N/A, 05/02/2013); Anterior and posterior repair (N/A, 05/02/2013); Eye surgery (Bilateral, 02/13/2011); Tubal ligation; Mohs surgery (06/2016); and Abdominal hysterectomy., family history includes Cancer in her mother; Healthy in her brother and father; Hypertension in her mother.,  reports that she has never smoked. She has never used smokeless tobacco. She reports that she does not drink alcohol or use drugs.  She has a current medication list which includes the following prescription(s): amoxicillin-clavulanate, aspirin ec, cephalexin, cholecalciferol, estradiol, hydrochlorothiazide, metoprolol tartrate, ramipril, and simvastatin. Also, is allergic to levaquin [levofloxacin]; omnicef [cefdinir]; and sulfamethoxazole-trimethoprim.  Review of Systems  All other systems reviewed and are negative.  Objective: BP 140/80   Ht 5\' 2"  (6.644 m)   Wt 142 lb (64.4 kg)   BMI 25.97 kg/m  Physical Exam  Constitutional: She is oriented to person, place, and time. She appears well-developed and well-nourished. No distress.  Genitourinary: Vagina normal. Pelvic exam was performed with patient supine. There is no rash, tenderness or lesion on the right labia. There is no rash, tenderness or lesion on the left labia. No erythema or bleeding in the vagina.  Genitourinary Comments: Cuff intact/ no lesions Gr 2 cystocele Absent uterus and cervix  HENT:  Head: Normocephalic  and atraumatic.  Nose: Nose normal.  Mouth/Throat: Oropharynx is clear and moist.  Abdominal: Soft. She exhibits no distension. There is no tenderness.  Musculoskeletal: Normal range of motion.  Neurological: She is alert and oriented to person, place, and time. No cranial nerve deficit.  Skin: Skin is warm and dry.  Psychiatric: She has a normal mood and affect.   A/P:1. Cystocele, midline 2. Recurrent UTI Pessary was placed today.  Ring #1 w support. Instructions given for care. Concerning symptoms to observe for are counseled to patient. Follow up scheduled for 1 months.  A total of 15 minutes were spent face-to-face with the patient during this encounter and over half of that time dealt with counseling and coordination of care.  Barnett Applebaum, MD, [facility], [facility] 04/15/2018  10:40 AM

## 2018-04-16 ENCOUNTER — Other Ambulatory Visit: Payer: Self-pay | Admitting: Internal Medicine

## 2018-04-19 ENCOUNTER — Encounter: Payer: Self-pay | Admitting: Obstetrics & Gynecology

## 2018-04-19 ENCOUNTER — Ambulatory Visit: Payer: Medicare HMO | Admitting: Obstetrics & Gynecology

## 2018-04-19 ENCOUNTER — Other Ambulatory Visit (HOSPITAL_COMMUNITY)
Admission: RE | Admit: 2018-04-19 | Discharge: 2018-04-19 | Disposition: A | Discharge status: Home / Self Care | Payer: Medicare HMO | Source: Ambulatory Visit | Attending: Obstetrics & Gynecology | Admitting: Obstetrics & Gynecology

## 2018-04-19 VITALS — BP 120/80 | Ht 60.0 in | Wt 142.0 lb

## 2018-04-19 DIAGNOSIS — N898 Other specified noninflammatory disorders of vagina: Secondary | ICD-10-CM | POA: Insufficient documentation

## 2018-04-19 NOTE — Progress Notes (Signed)
  HPI:      [name] is a 77 y.o. [id number] who LMP was No LMP recorded. Patient has had a hysterectomy., presents today for a problem visit.  She complains of:  Vaginitis: Patient complains of an abnormal vaginal discharge for 4 days. Vaginal symptoms include discharge described as white and odorless.Vulvar symptoms include none.STI Risk: Very low risk of STD exposureDischarge described as: white and odorless.Other associated symptoms: none.Menstrual pattern: She had been bleeding none.  Pessary placed 4 days ago, adapting to its position and feel.  PMHx: She  has a past medical history of Anxiety, Cancer ([place]), Gilbert's syndrome, Hearing loss (1975), Hyperlipidemia (2000), and Hypertension (2000). Also,  has a past surgical history that includes Colonoscopy (08/28/2011); Vaginal hysterectomy (N/A, 05/02/2013); Anterior and posterior repair (N/A, 05/02/2013); Eye surgery (Bilateral, 02/13/2011); Tubal ligation; Mohs surgery (06/2016); and Abdominal hysterectomy., family history includes Cancer in her mother; Healthy in her brother and father; Hypertension in her mother.,  reports that she has never smoked. She has never used smokeless tobacco. She reports that she does not drink alcohol or use drugs.  She has a current medication list which includes the following prescription(s): amoxicillin-clavulanate, aspirin ec, cephalexin, cholecalciferol, estradiol, hydrochlorothiazide, metoprolol tartrate, ramipril, and simvastatin. Also, is allergic to levaquin [levofloxacin]; omnicef [cefdinir]; and sulfamethoxazole-trimethoprim.  Review of Systems  All other systems reviewed and are negative.   Objective: BP 120/80   Ht 5' (1.524 m)   Wt 142 lb (64.4 kg)   BMI 27.73 kg/m  Physical Exam  Constitutional: She is oriented to person, place, and time. She appears well-developed and well-nourished. No distress.  Genitourinary: Vagina normal. Pelvic exam was performed with patient supine. There is  no rash, tenderness or lesion on the right labia. There is no rash, tenderness or lesion on the left labia. No erythema or bleeding in the vagina.  Genitourinary Comments: Pessary in place Cystocele Scant vag d/c  HENT:  Head: Normocephalic and atraumatic.  Nose: Nose normal.  Mouth/Throat: Oropharynx is clear and moist.  Abdominal: Soft. She exhibits no distension. There is no tenderness.  Musculoskeletal: Normal range of motion.  Neurological: She is alert and oriented to person, place, and time. No cranial nerve deficit.  Skin: Skin is warm and dry.  Psychiatric: She has a normal mood and affect.   Microscopic wet-mount exam shows negative for pathogens, normal epithelial cells.  ASSESSMENT/PLAN:    Vaginal discharge    -  Primary, Assess for BV, Yeast    May be physiologic in response to new pessary   Relevant Orders   Cervicovaginal ancillary only  Cont pessary and monitor her adjustment to it Consider Gel[place] if continues to bother (#1)   A total of 15 minutes were spent face-to-face with the patient during this encounter and over half of that time dealt with counseling and coordination of care.  Barnett Applebaum, MD, [facility], [facility] 04/19/2018  11:34 AM

## 2018-04-20 LAB — CERVICOVAGINAL ANCILLARY ONLY
BACTERIAL VAGINITIS: NEGATIVE
CANDIDA VAGINITIS: NEGATIVE

## 2018-04-21 NOTE — Progress Notes (Signed)
Let her know cultures NEG for infection

## 2018-04-22 ENCOUNTER — Ambulatory Visit: Payer: Medicare HMO | Admitting: Internal Medicine

## 2018-04-22 ENCOUNTER — Encounter: Payer: Self-pay | Admitting: Internal Medicine

## 2018-04-22 VITALS — BP 120/70 | HR 60 | Resp 16 | Ht 60.0 in | Wt 139.0 lb

## 2018-04-22 DIAGNOSIS — N390 Urinary tract infection, site not specified: Secondary | ICD-10-CM

## 2018-04-22 DIAGNOSIS — I10 Essential (primary) hypertension: Secondary | ICD-10-CM

## 2018-04-22 DIAGNOSIS — Z23 Encounter for immunization: Secondary | ICD-10-CM

## 2018-04-22 DIAGNOSIS — I493 Ventricular premature depolarization: Secondary | ICD-10-CM | POA: Diagnosis not present

## 2018-04-22 MED ORDER — RAMIPRIL 10 MG PO CAPS: 10.0000 mg | ORAL_CAPSULE | Freq: Every day | ORAL | 3 refills | Status: DC

## 2018-04-22 NOTE — Progress Notes (Signed)
Date:  04/22/2018   Name:  Rebecca Gutierrez   DOB:  1941/02/19   MRN:  [id number]   Chief Complaint: Hypertension  Hypertension  This is a chronic problem. The problem is controlled. Associated symptoms include palpitations (occasional). Pertinent negatives include no chest pain, headaches or shortness of breath. Past treatments include diuretics, beta blockers and ACE inhibitors. The current treatment provides significant improvement. There are no compliance problems.  There is no history of kidney disease, CAD/MI or heart failure. There is no history of chronic renal disease or a thyroid problem.  Recurrent UTI - recently treated for Klebsiella UTI.  Seen by Urology.  Renal US and CT done with no abnormality.  She now has a pessary and finds that she is having less frequency.  Lab Results  Component Value Date   CREATININE 0.75 03/16/2018   BUN 19 03/16/2018   NA 142 10/19/2017   K 3.9 10/19/2017   CL 100 10/19/2017   CO2 26 10/19/2017     Review of Systems  Constitutional: Negative for chills, fatigue, fever and unexpected weight change.  Respiratory: Negative for cough, chest tightness, shortness of breath and wheezing.   Cardiovascular: Positive for palpitations (occasional). Negative for chest pain and leg swelling.  Gastrointestinal: Negative for abdominal pain, blood in stool, constipation and diarrhea.  Genitourinary: Positive for frequency. Negative for dyspareunia and urgency.  Neurological: Negative for dizziness, light-headedness and headaches.  Psychiatric/Behavioral: Negative for dysphoric mood and sleep disturbance. The patient is not nervous/anxious.     Patient Active Problem List   Diagnosis Date Noted  . Cystocele, midline 04/11/2018  . Moderate mitral insufficiency 11/10/2017  . 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 Recurrent UTI 03/10/2015  . OSTEOPENIA 06/28/2009  . Hyperlipidemia LDL goal <100 06/02/2007  . 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  Hearing loss 06/02/2007  . Essential hypertension, benign 06/02/2007  . Acute bilateral low back pain with bilateral sciatica 06/02/2007    Allergies  Allergen Reactions  . Levaquin [Levofloxacin] Other (See Comments)    insomnia  . [name] Diarrhea  . [name]     Past Surgical History:  Procedure Laterality Date  . ABDOMINAL HYSTERECTOMY    . ANTERIOR AND POSTERIOR REPAIR N/A 05/02/2013   Procedure: ANTERIOR (CYSTOCELE) AND POSTERIOR REPAIR (RECTOCELE);  Surgeon: [name], MD;  Location: [facility];  Service: Gynecology;  Laterality: N/A;  . COLONOSCOPY  08/28/2011   Procedure: COLONOSCOPY;  Surgeon: [name], MD;  Location: [facility];  Service: Endoscopy;  Laterality: N/A;  930  . EYE SURGERY Bilateral 02/13/2011   other 2013  . 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 TUBAL LIGATION    . VAGINAL HYSTERECTOMY N/A 05/02/2013   Procedure: HYSTERECTOMY VAGINAL;  Surgeon: [name], MD;  Location: [facility];  Service: Gynecology;  Laterality: N/A;    Social History   Tobacco Use  . Smoking status: Never Smoker  . Smokeless tobacco: Never Used  . Tobacco comment: smoking cessation materials not required  Substance Use Topics  . Alcohol use: No  . Drug use: No     Medication list has been reviewed and updated.  Current Meds  Medication Sig  . aspirin EC 81 MG tablet Take 81 mg by mouth at bedtime.   . Cholecalciferol (VITAMIN D HIGH POTENCY PO) Take by mouth.  . estradiol (ESTRACE) 0.1 MG/GM vaginal cream Place 1 Applicatorful vaginally at bedtime.  . hydrochlorothiazide (  HYDRODIURIL) 25 MG tablet TAKE 1 TABLET DAILY  . metoprolol tartrate (LOPRESSOR) 25 MG tablet TAKE ONE AND ONE-HALF TABLETS TWICE A DAY  . ramipril (ALTACE) 10 MG capsule TAKE 1 CAPSULE DAILY  . simvastatin (ZOCOR) 10 MG tablet TAKE 1 TABLET DAILY    PHQ 2/9 Scores 10/04/2017 09/29/2016 05/01/2016 04/02/2015  PHQ - 2 Score 0 0 0 0    PHQ- 9 Score 0 - - -    Physical Exam  Constitutional: She is oriented to person, place, and time. She appears well-developed. No distress.  HENT:  Head: Normocephalic and atraumatic.  Neck: Normal range of motion. Neck supple. Carotid bruit is not present.  Cardiovascular: Normal rate, regular rhythm and normal heart sounds.  Pulmonary/Chest: Effort normal and breath sounds normal. No respiratory distress.  Musculoskeletal: Normal range of motion. She exhibits no edema or tenderness.  Neurological: She is alert and oriented to person, place, and time.  Skin: Skin is warm and dry. No rash noted.  Psychiatric: She has a normal mood and affect. Her behavior is normal. Thought content normal.  Nursing note and vitals reviewed.   BP 120/70   Pulse 60   Resp 16   Ht 5' (1.524 m)   Wt 139 lb (63 kg)   SpO2 97%   BMI 27.15 kg/m   Assessment and Plan: 1. Essential hypertension, benign Controlled, continue current therapy  2. Frequent PVCs Minimally sx  3. Recurrent UTI Recently had pessary placed and she is hopeful for improvement  4. Encounter for immunization - Flu vaccine HIGH DOSE PF   Partially dictated using Editor, commissioning. Any errors are unintentional.  Halina Maidens, MD [facility]  04/22/2018

## 2018-04-26 DIAGNOSIS — I493 Ventricular premature depolarization: Secondary | ICD-10-CM | POA: Diagnosis not present

## 2018-04-26 DIAGNOSIS — I340 Nonrheumatic mitral (valve) insufficiency: Secondary | ICD-10-CM | POA: Diagnosis not present

## 2018-04-26 DIAGNOSIS — E782 Mixed hyperlipidemia: Secondary | ICD-10-CM | POA: Diagnosis not present

## 2018-04-26 DIAGNOSIS — I10 Essential (primary) hypertension: Secondary | ICD-10-CM | POA: Diagnosis not present

## 2018-05-03 ENCOUNTER — Ambulatory Visit (INDEPENDENT_AMBULATORY_CARE_PROVIDER_SITE_OTHER): Payer: Medicare HMO

## 2018-05-03 VITALS — BP 127/74 | HR 83

## 2018-05-03 DIAGNOSIS — R300 Dysuria: Secondary | ICD-10-CM

## 2018-05-03 LAB — URINALYSIS, COMPLETE
Bilirubin, UA: NEGATIVE
GLUCOSE, UA: NEGATIVE
Ketones, UA: NEGATIVE
Nitrite, UA: POSITIVE — AB
PROTEIN UA: NEGATIVE
Specific Gravity, UA: 1.015 (ref 1.005–1.030)
UUROB: 0.2 mg/dL (ref 0.2–1.0)
pH, UA: 6.5 (ref 5.0–7.5)

## 2018-05-03 LAB — MICROSCOPIC EXAMINATION

## 2018-05-03 MED ORDER — CEPHALEXIN 500 MG PO CAPS: 500.0000 mg | ORAL_CAPSULE | Freq: Two times a day (BID) | ORAL | 0 refills | Status: DC

## 2018-05-03 NOTE — Progress Notes (Signed)
Patient present today complaining of urinary frequency and dysuria starting yesterday. Patient's urine today does look positive for infection per [name] Keflex was sent to patient's pharm to start today and urine culture was sent. Will call with culture results, patient was notified

## 2018-05-07 LAB — CULTURE, URINE COMPREHENSIVE

## 2018-05-09 ENCOUNTER — Ambulatory Visit: Payer: Medicare HMO | Admitting: Urology

## 2018-05-09 ENCOUNTER — Encounter: Payer: Self-pay | Admitting: Urology

## 2018-05-09 VITALS — BP 143/80 | HR 82 | Temp 98.1°F | Resp 14 | Ht 60.0 in | Wt 139.0 lb

## 2018-05-09 DIAGNOSIS — N952 Postmenopausal atrophic vaginitis: Secondary | ICD-10-CM | POA: Diagnosis not present

## 2018-05-09 DIAGNOSIS — Z87440 Personal history of urinary (tract) infections: Secondary | ICD-10-CM

## 2018-05-09 DIAGNOSIS — N8111 Cystocele, midline: Secondary | ICD-10-CM

## 2018-05-09 NOTE — Progress Notes (Signed)
05/09/2018 3:37 PM   Rebecca Gutierrez 1941/01/12 [id number]  Referring provider: [name], MD [address] [place], [place] [address]  Chief Complaint  Patient presents with  . Follow-up    HPI: Patient is a 77 year old Caucasian female with a history of rUTI's, vaginal atrophy and a complex left renal cyst who presents today after having another UTI.    rUTI's Risk factors: age, vaginal atrophy and limited fluid intake.  RUS on 03/02/2018 revealed mild right-sided hydronephrosis versus extrarenal pelvis. There is no dilation of the calices.  Extrarenal pelvis of the left kidney.  No other focal abnormality. Cysto on March 07, 2018 was negative.  CT w/wo on 03/16/2018 revealed no findings to explain the patient's history of recurrent UTI.  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  No right-sided hydronephrosis. Appearance on recent ultrasound was due to prominent central sinus cysts in both kidneys. 3. 8 mm low-density lesion in the interpolar left kidney cannot be definitively characterized but has attenuation higher than would be expected for a simple cyst. This may be a cyst complicated by proteinaceous debris or hemorrhage. Follow-up could be used to ensure stability. MRI may be able to definitively characterize this lesion and preclude the need for additional follow-up imaging.  Small hiatal hernia.  Pelvic floor laxity.  Aortic Atherosclerois   She continues to have UTI's despite prophylactic antibiotic.    Today, she states she is still not a very good water drinker.  She drinks mostly Crystal Light flavored drinks.  She drinks coffee and milk and some cranberry juice.  She is complaining of nocturia and hesitancy.  She is still on her antibiotic from her recent infection, but she is fearful her infection will return as soon as she stops her antibiotic.    Vaginal atrophy She is using the  vaginal estrogen cream 3 nights weekly.    Complex left renal cyst Seen on triphasic CT in 03/2018.  She is scheduled for a follow up RUS in 2 months.      PMH: Past Medical History:  Diagnosis Date  . Abnormal ECG 10/28/2017  . Anxiety   . Bradycardia 11/10/2017  . Cancer ([place])    skin ca  . Cystocele 09/13/2013   small recurrent, less than before 09/13/13 JVF   . Gilbert's syndrome   . Hearing loss 1975  . History of recurrent urinary tract infection 03/24/2016  . Hyperlipidemia 2000  . Hypertension 2000    Surgical History: Past Surgical History:  Procedure Laterality Date  . ABDOMINAL HYSTERECTOMY    . ANTERIOR AND POSTERIOR REPAIR N/A 05/02/2013   Procedure: ANTERIOR (CYSTOCELE) AND POSTERIOR REPAIR (RECTOCELE);  Surgeon: [name], MD;  Location: [facility];  Service: Gynecology;  Laterality: N/A;  . COLONOSCOPY  08/28/2011   Procedure: COLONOSCOPY;  Surgeon: [name], MD;  [facility];  Service: Endoscopy;  Laterality: N/A;  930  . EYE SURGERY Bilateral 02/13/2011   other 2013  . 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 TUBAL LIGATION    . VAGINAL HYSTERECTOMY N/A 05/02/2013   Procedure: HYSTERECTOMY VAGINAL;  Surgeon: [name], MD;  Location: [facility];  Service: Gynecology;  Laterality: N/A;    Home Medications:  Allergies as of 05/09/2018      Reactions   Levaquin [levofloxacin] Other (See Comments)   insomnia  [name] Diarrhea   [name]       Medication List        Accurate as of 05/09/18 11:59 PM. Always use your most recent med list.          aspirin EC 81 MG tablet Take 81 mg by mouth at bedtime.   cephALEXin 500 MG capsule Commonly known as:  KEFLEX Take 1 capsule (500 mg total) by mouth 2 (two) times daily.   estradiol 0.1 MG/GM vaginal cream Commonly known as:  ESTRACE Place 1 Applicatorful vaginally at bedtime.   hydrochlorothiazide 25 MG tablet Commonly known  as:  HYDRODIURIL TAKE 1 TABLET DAILY   metoprolol tartrate 25 MG tablet Commonly known as:  LOPRESSOR TAKE ONE AND ONE-HALF TABLETS TWICE A DAY   NON FORMULARY pessary   ramipril 10 MG capsule Commonly known as:  ALTACE Take 1 capsule (10 mg total) by mouth daily.   simvastatin 10 MG tablet Commonly known as:  ZOCOR TAKE 1 TABLET DAILY   VITAMIN D HIGH POTENCY PO Take by mouth.       Allergies:  Allergies  Allergen Reactions  . Levaquin [Levofloxacin] Other (See Comments)    insomnia  . [name] Diarrhea  . [name]     Family History: Family History  Problem Relation Age of Onset  . Hypertension Mother   . Cancer Mother        stomach  . Healthy Father   . Healthy Brother   . Colon cancer Neg Hx   . Prostate cancer Neg Hx   . Kidney cancer Neg Hx   . Bladder Cancer Neg Hx   . Breast cancer Neg Hx     Social History:  reports that she has never smoked. She has never used smokeless tobacco. She reports that she does not drink alcohol or use drugs.  ROS: UROLOGY Frequent Urination?: No Hard to postpone urination?: No Burning/pain with urination?: No Get up at night to urinate?: Yes Leakage of urine?: No Urine stream starts and stops?: No Trouble starting stream?: Yes Do you have to strain to urinate?: No Blood in urine?: No Urinary tract infection?: Yes Sexually transmitted disease?: No Injury to kidneys or bladder?: No Painful intercourse?: No Weak stream?: No Currently pregnant?: No Vaginal bleeding?: No Last menstrual period?: n  Gastrointestinal Nausea?: No Vomiting?: No Indigestion/heartburn?: No Diarrhea?: No Constipation?: No  Constitutional Fever: No Night sweats?: No Weight loss?: No Fatigue?: No  Skin Skin rash/lesions?: No Itching?: No  Eyes Blurred vision?: No Double vision?: No  Ears/Nose/Throat Sore throat?: No Sinus problems?: No  Hematologic/Lymphatic Swollen glands?: No Easy  bruising?: No  Cardiovascular Leg swelling?: No Chest pain?: No  Respiratory Cough?: No Shortness of breath?: No  Endocrine Excessive thirst?: No  Musculoskeletal Back pain?: No Joint pain?: No  Neurological Headaches?: No Dizziness?: No  Psychologic Depression?: No Anxiety?: No  Physical Exam: BP (!) 143/80   Pulse 82   Temp 98.1 F (36.7 C)   Resp 14   Ht 5' (1.524 m)   Wt 139 lb (63 kg)   BMI 27.15 kg/m   Constitutional:  Well nourished. Alert and oriented, No acute distress. HEENT: [place] AT, moist mucus membranes.  Trachea midline, no masses. Cardiovascular: No clubbing, cyanosis, or edema. Respiratory: Normal respiratory effort, no increased work of breathing. Skin: No rashes, bruises or suspicious lesions. Neurologic: Grossly intact, no focal deficits, moving all 4 extremities. Psychiatric: Normal mood and affect.   Laboratory Data: Lab Results  Component  Value Date   WBC 7.4 10/19/2017   HGB 14.5 10/19/2017   HCT 42.6 10/19/2017   MCV 90 10/19/2017   PLT 246 10/19/2017    Lab Results  Component Value Date   CREATININE 0.75 03/16/2018    Lab Results  Component Value Date   HGBA1C 5.6 07/23/2011    Lab Results  Component Value Date   TSH 3.250 10/19/2017       Component Value Date/Time   CHOL 194 10/19/2017 1011   HDL 72 10/19/2017 1011   CHOLHDL 2.7 10/19/2017 1011   CHOLHDL 2.1 09/25/2015 0822   VLDL 13 09/25/2015 0822   LDLCALC 99 10/19/2017 1011    Lab Results  Component Value Date   AST 19 10/19/2017   Lab Results  Component Value Date   ALT 18 10/19/2017    I have reviewed the labs.  Assessment & Plan:    1. Recurrent UTI's Reviewed UTI preventative strategies Encouraged patient to increase her water intake CATH UA is suspicious for infection, will send for culture, will hold on prescribing antibiotic until culture results are available She will discontinue her trimethoprim daily at this time  2. Cystocele Has a  pessary in place   3. Vaginal atrophy Patient to continue the vaginal estrogen cream 3 nights weekly  4. Nocturia Discussed reducing fluid intake and caffeine intake prior to bedtime  5. Left renal mass Discussed with the patient that we could further evaluate this lesion with an MRI or she return in 3 months and we can obatin a renal ultrasound at that time RTC in 3 months for RUS   Return for keep follow up appointment .  These notes generated with voice recognition software. I apologize for typographical errors.  Zara Council, PA-C  [facility] [address] [place], [place] [address] [phone]

## 2018-05-09 NOTE — Patient Instructions (Signed)
                                             Urinary Tract Infection Prevention Patient Education Stay Hydrated: Urinary tract infections (UTIs) are less likely to occur in someone who is drinking enough water to promote regular urination, so it is very important to stay hydrated in order to help flush out bacteria from the urinary tract. Respond to "Nature's Call": It is always a good idea to urinate as soon as you feel the need. While "holding it in" does not directly cause an infection, it can cause overdistension that can damage the lining of the bladder, making it more vulnerable to bacteria. Remove Tampons Before Going: Remember to always take out tampons before urinating, and change tampons often.  Practice Proper Bathroom Hygiene: To keep bacteria near the urethral opening to a minimum, it is important to practice proper wiping techniques (i.e. front to back wiping) to help prevent rectal bacteria from entering the uretro-genital area. It can also be helpful to take showers and avoid soaking in the bathtub.  Take a Vitamin C Supplement: About 1,000 milligrams of vitamin C taken daily can help inhibit the growth of some bacteria by acidifying the urine. Maintain Control with Cranberries: Cranberries contain hippuronic acid, which is a natural antiseptic that may help prevent the adherence of bacteria to the bladder lining. Drinking 100% pure cranberry juice or taking over the counter cranberry supplements twice daily may help to prevent an infection. However, it is important to note that cranberry juices/supplements are not helpful once a urinary tract infection (UTI) is present. Strengthen Your Core: Often, a lazy bladder (unable to empty urine properly) occurs due to lower back problem, so consider doing exercises to help strengthen your back, pelvic floor, and stomach muscles.  Pay Attention to Your Urine: Your urine can change color for a variety of reasons, including from the medications you  take, so pay close attention to it to monitor your overall health. One key thing to note is that if your urine is typically a darker yellow, your body is dehydrated, so you need to step up your water intake.   It is important that you drink 6 to 8 glasses of water daily.  Half of the fluid intake should be water. Take most of your fluids before dinner. Don't drink too much liquid at once, sip instead of gulp.  There are certain beverages and foods that have known to the bladder irritants and they should be avoided or consumed infrequently. These include but not limited to beverages and foods containing caffeine, carbonated beverages, highly acidic or spicy foods, such as fruits, tomato products, artificial sweeteners and alcohol.  If you find it difficult to avoid these foods it may be helpful to alternate water in between consuming these foods.  It is important to stay well hydrated and eat 24 to 30 grams of fiber daily.    It has shown that even weight loss can help with urinary incontinence. In this study it demonstrated that and a percent weight loss and obese women (20 pound loss for a 250 pound woman) cut the number of incontinence episodes nearly in half.

## 2018-05-16 ENCOUNTER — Ambulatory Visit: Payer: Medicare HMO | Admitting: Obstetrics & Gynecology

## 2018-05-16 ENCOUNTER — Encounter: Payer: Self-pay | Admitting: Obstetrics & Gynecology

## 2018-05-16 VITALS — BP 120/80 | Ht 62.0 in | Wt 141.0 lb

## 2018-05-16 DIAGNOSIS — N8111 Cystocele, midline: Secondary | ICD-10-CM

## 2018-05-16 DIAGNOSIS — N390 Urinary tract infection, site not specified: Secondary | ICD-10-CM

## 2018-05-16 NOTE — Progress Notes (Signed)
  HPI:      [name] is a 77 y.o. [phone] who presents today for her pessary follow up and examination related to her pelvic floor weakening - mostly for recurrent UTI and attempt to see if this will help minimize occurences of UTI.  Pt reports tolerating the pessary well with  no vaginal bleeding and scant vaginal discharge.  Symptoms of pelvic floor weakening have somewhat improved. She is voiding and defecating without difficulty. She currently has a RING #1 pessary.  PMHx: She  has a past medical history of Abnormal ECG (10/28/2017), Anxiety, Bradycardia (11/10/2017), Cancer ([place]), Cystocele (09/13/2013), Gilbert's syndrome, Hearing loss (1975), History of recurrent urinary tract infection (03/24/2016), Hyperlipidemia (2000), and Hypertension (2000). Also,  has a past surgical history that includes Colonoscopy (08/28/2011); Vaginal hysterectomy (N/A, 05/02/2013); Anterior and posterior repair (N/A, 05/02/2013); Eye surgery (Bilateral, 02/13/2011); Tubal ligation; Mohs surgery (06/2016); and Abdominal hysterectomy., family history includes Cancer in her mother; Healthy in her brother and father; Hypertension in her mother.,  reports that she has never smoked. She has never used smokeless tobacco. She reports that she does not drink alcohol or use drugs.  She has a current medication list which includes the following prescription(s): aspirin ec, cephalexin, cholecalciferol, estradiol, hydrochlorothiazide, metoprolol tartrate, NON FORMULARY, ramipril, and simvastatin. Also, is allergic to levaquin [levofloxacin]; omnicef [cefdinir]; and sulfamethoxazole-trimethoprim.  Review of Systems  All other systems reviewed and are negative.   Objective: BP 120/80   Ht 5\' 2"  (1.575 m)   Wt 141 lb (64 kg)   BMI 25.79 kg/m  Physical Exam  Constitutional: She is oriented to person, place, and time. She appears well-developed and well-nourished. No distress.  Genitourinary: Vagina normal. Pelvic exam  was performed with patient supine. There is no rash, tenderness or lesion on the right labia. There is no rash, tenderness or lesion on the left labia. No erythema or bleeding in the vagina.  Genitourinary Comments: Cuff intact/ no lesions Gr 2 cystocele w vaginal prolapse Absent uterus and cervix  HENT:  Head: Normocephalic and atraumatic.  Nose: Nose normal.  Mouth/Throat: Oropharynx is clear and moist.  Abdominal: Soft. She exhibits no distension. There is no tenderness.  Musculoskeletal: Normal range of motion.  Neurological: She is alert and oriented to person, place, and time. No cranial nerve deficit.  Skin: Skin is warm and dry.  Psychiatric: She has a normal mood and affect.   Pessary Care Pessary removed and cleaned.  Vagina checked - without erosions - pessary replaced.  A/P:  1. Cystocele, midline 2. Recurrent UTI Pessary was cleaned and replaced today. Instructions given for care. Concerning symptoms to observe for are counseled to patient. Follow up scheduled for 3 months. If continues to not like pessary, and if no change in UTI frequency, then would discontinue pessary therapy.  Surgery not recommended, as she has min prolapse and incontinence sx's.  A total of 15 minutes were spent face-to-face with the patient during this encounter and over half of that time dealt with counseling and coordination of care.  Barnett Applebaum, MD, [facility], [facility] 05/16/2018  11:01 AM

## 2018-05-23 NOTE — Progress Notes (Signed)
05/24/2018  12:03 PM   Rebecca Gutierrez [birth year] [id number]  Referring provider: [name], MD [address] [place], [place] [address]  Chief Complaint  Patient presents with  . Other  . Cystitis    HPI: Rebecca Gutierrez is a 77 y.o. Caucasian female presenting today reporting frequency and cloudy urine. She has a history of rUTI's, vaginal atrophy and a complex left renal cyst.   rUTI's Resistances as of 05/03/18  amoxcillin/clavulanic acid  ampicillin   nitrofurantoin   piperacillin/tazobactam   tetracvcliine   trimetphrim/sulfa   Risk factors: age, vaginal atrophy and limited fluid intake RUS on 03/02/2018 revealed mild right-sided hydronephrosis versus extrarenal pelvis. There is no dilation of the calices.  Extrarenal pelvis of the left kidney.  No other focal abnormality. Cysto on 03/07/2018 was negative. CT w/wo on 03/16/2018 revealed no findings to explain the patient's history of recurrent UTI.  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  No right-sided hydronephrosis. Appearance on recent ultrasound was due to prominent central sinus cysts in both kidneys. 3.8 mm low-density lesion in the interpolar left kidney cannot be definitively characterized but has attenuation higher than would be expected for a simple cyst. This may be a cyst complicated by proteinaceous debris or hemorrhage. Follow-up could be used to ensure stability. MRI may be able to definitively characterize this lesion and preclude the need for additional follow-up imaging.  Small hiatal hernia.  Pelvic floor laxity.  Aortic Atherosclerois   She continues to have UTI's despite prophylactic antibiotic.    She reports that last Wednesday (05/18/2018) she began experiencing dysuria and cloudy urine. She took one of her previously prescribed pills which she claims alleviated her symptoms. She took the pills from 12/04 to  12/08. She did not call the office when she began experiencing pain because she wanted to "try and treat it herself". Patient admits attempting to increase water intake and is currently drinking about 2-16oz bottles of water daily. She also drinks coffee in the mornings.  Patient denies allergies to betadine, iodine and Lidocaine. Cath UA today positive for 11-30 WBC's, many bacteria and nitrite positive.    Patient denies holding bladder, practices good perineal hygiene and refrains from tub baths. Denies constipation.  She has a midline cystocele and is managing it with pessary therapy through her OBGYN.  Vaginal atrophy She is using the vaginal estrogen cream 3 nights weekly. She reports continued usage every other day.  Complex left renal cyst Seen on triphasic CT in 03/2018.  She is scheduled for a follow up RUS in 2 months.  (06/2018)  PMH: Past Medical History:  Diagnosis Date  . Abnormal ECG 10/28/2017  . Anxiety   . Bradycardia 11/10/2017  . Cancer ([place])    skin ca  . Cystocele 09/13/2013   small recurrent, less than before 09/13/13 JVF   . Gilbert's syndrome   . Hearing loss 1975  . History of recurrent urinary tract infection 03/24/2016  . Hyperlipidemia 2000  . Hypertension 2000   Surgical History: Past Surgical History:  Procedure Laterality Date  . ABDOMINAL HYSTERECTOMY    . ANTERIOR AND POSTERIOR REPAIR N/A 05/02/2013   Procedure: ANTERIOR (CYSTOCELE) AND POSTERIOR REPAIR (RECTOCELE);  Surgeon: [name], MD;  Location: [facility];  Service: Gynecology;  Laterality: N/A;  . COLONOSCOPY  08/28/2011   Procedure: COLONOSCOPY;  Surgeon: [name], MD;  Location: [facility];  Service:  Endoscopy;  Laterality: N/A;  930  . EYE SURGERY Bilateral 02/13/2011   other 2013  . 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 TUBAL LIGATION    . VAGINAL HYSTERECTOMY N/A 05/02/2013   Procedure: HYSTERECTOMY VAGINAL;  Surgeon: [name], MD;  Location:  [facility];  Service: Gynecology;  Laterality: N/A;   Home Medications:  Allergies as of 05/24/2018      Reactions   Levaquin [levofloxacin] Other (See Comments)   insomnia   [name] Diarrhea   [name]       Medication List        Accurate as of 05/24/18 12:03 PM. Always use your most recent med list.          aspirin EC 81 MG tablet Take 81 mg by mouth at bedtime.   cephALEXin 500 MG capsule Commonly known as:  KEFLEX Take 1 capsule (500 mg total) by mouth 2 (two) times daily.   estradiol 0.1 MG/GM vaginal cream Commonly known as:  ESTRACE Place 1 Applicatorful vaginally at bedtime.   hydrochlorothiazide 25 MG tablet Commonly known as:  HYDRODIURIL TAKE 1 TABLET DAILY   metoprolol tartrate 25 MG tablet Commonly known as:  LOPRESSOR TAKE ONE AND ONE-HALF TABLETS TWICE A DAY   NON FORMULARY pessary   ramipril 10 MG capsule Commonly known as:  ALTACE Take 1 capsule (10 mg total) by mouth daily.   simvastatin 10 MG tablet Commonly known as:  ZOCOR TAKE 1 TABLET DAILY   VITAMIN D HIGH POTENCY PO Take by mouth.      Allergies:  Allergies  Allergen Reactions  . Levaquin [Levofloxacin] Other (See Comments)    insomnia  . [name] Diarrhea  . [name]    Family History: Family History  Problem Relation Age of Onset  . Hypertension Mother   . Cancer Mother        stomach  . Healthy Father   . Healthy Brother   . Colon cancer Neg Hx   . Prostate cancer Neg Hx   . Kidney cancer Neg Hx   . Bladder Cancer Neg Hx   . Breast cancer Neg Hx    Social History:  reports that she has never smoked. She has never used smokeless tobacco. She reports that she does not drink alcohol or use drugs.  ROS: UROLOGY Frequent Urination?: Yes Hard to postpone urination?: No Burning/pain with urination?: Yes Get up at night to urinate?: Yes Leakage of urine?: No Urine stream starts and stops?: No Trouble  starting stream?: No Do you have to strain to urinate?: No Blood in urine?: No Urinary tract infection?: Yes Sexually transmitted disease?: No Injury to kidneys or bladder?: No Painful intercourse?: No Weak stream?: No Currently pregnant?: No Vaginal bleeding?: No Last menstrual period?: n  Gastrointestinal Nausea?: No Vomiting?: No Indigestion/heartburn?: No Diarrhea?: No Constipation?: No  Constitutional Fever: No Night sweats?: No Weight loss?: No Fatigue?: No  Skin Skin rash/lesions?: No Itching?: No  Eyes Blurred vision?: No Double vision?: No  Ears/Nose/Throat Sore throat?: No Sinus problems?: No  Hematologic/Lymphatic Swollen glands?: No Easy bruising?: No  Cardiovascular Leg swelling?: No Chest pain?: No  Respiratory Cough?: No Shortness of breath?: No  Endocrine Excessive thirst?: No  Musculoskeletal Back pain?: No Joint pain?: No  Neurological Headaches?: No Dizziness?: No  Psychologic Depression?: No Anxiety?: No  Physical Exam: BP 121/73   Pulse 70   Ht 5\' 2"  (1.575 m)   Wt 139  lb (63 kg)   BMI 25.42 kg/m   Constitutional:  Well nourished. Alert and oriented, No acute distress. Head: Normocephalic and Atraumatic. Respiratory: Normal respiratory effort, no increased work of breathing. GU: Atrophic external, sparse pubic hair. Pessary in place.  Skin: No rashes, bruises or suspicious lesions. Neurologic: Grossly intact, no focal deficits, moving all 4 extremities. Psychiatric: Normal mood and affect.   In and Out Catheterization  Patient is present today for a I & O catheterization due to rUTI. Patient was cleaned and prepped in a sterile fashion with betadine and Lidocaine 2% jelly was instilled into the urethra.  A 14FR/Ch (4.7 mm) cath was inserted no complications were noted , 181ml of urine return was noted, urine was yellow in color and cloudy. A clean urine sample was collected for UA. Bladder was drained  And catheter  was removed with out difficulty.    Preformed by: Nori Riis, PA-C   Laboratory Data: Results for orders placed or performed in visit on 05/03/18 (from the past 1008 hour(s))  Microscopic Examination   Collection Time: 05/03/18  1:19 PM  Result Value Ref Range   WBC, UA 11-30 (A) 0 - 5 /hpf   RBC, UA 0-2 0 - 2 /hpf   Epithelial Cells (non renal) 0-10 0 - 10 /hpf   Renal Epithel, UA 0-10 (A) None seen /hpf   Mucus, UA Present (A) Not Estab.   Bacteria, UA Many (A) None seen/Few  Urinalysis, Complete   Collection Time: 05/03/18  1:19 PM  Result Value Ref Range   Specific Gravity, UA 1.015 1.005 - 1.030   pH, UA 6.5 5.0 - 7.5   Color, UA Yellow Yellow   Appearance Ur Cloudy (A) Clear   Leukocytes, UA 2+ (A) Negative   Protein, UA Negative Negative/Trace   Glucose, UA Negative Negative   Ketones, UA Negative Negative   RBC, UA Trace (A) Negative   Bilirubin, UA Negative Negative   Urobilinogen, Ur 0.2 0.2 - 1.0 mg/dL   Nitrite, UA Positive (A) Negative   Microscopic Examination See below:   CULTURE, URINE COMPREHENSIVE   Collection Time: 05/03/18  1:34 PM  Result Value Ref Range   Urine Culture, Comprehensive Final report (A)    Organism ID, Bacteria Klebsiella pneumoniae (A)    ANTIMICROBIAL SUSCEPTIBILITY Comment   Results for orders placed or performed in visit on 04/19/18 (from the past 1008 hour(s))  Cervicovaginal ancillary only   Collection Time: 04/19/18 12:00 AM  Result Value Ref Range   Bacterial vaginitis Negative for Bacterial Vaginitis Microorganisms    Candida vaginitis Negative for Candida species    I have reviewed the labs.  Assessment & Plan:    1. Recurrent UTI's  - Went over UTI preventative strategies with patient again.  - Emphasizing their importance and clarifying patient misconceptions.  - Encouraged patient to increase her water intake and incorporate cranberry juice into diet. If patient cannot tolerate cranberry juice, advised to take  cranberry tablets. Explained importance of probiotics and the mechanism through which they work to prevent UTIs  - Explained to the patient that she has developed resistances to the medications previously prescribed and that it is ill advised to take them without discussing with a medical provider before hand.  - Recommended bladder evacuation attempts without urgency prior.  - Recommended she call the office when she is experiencing symptoms of an infection.  - Cath UA today to r/o infection. UA came back positive. Will refrain from antibiotic  intervention until culture results return. If needed to be sent to [facility] not [facility]. Will update patient with results.  - Following culture, will send patient to Infectious Disease for follow-up  2. Cystocele  - Managing with pessary therapy through OB/GYN  - Pt admits that she is still using the pessary   3. Vaginal atrophy  - Patient to continue the vaginal estrogen cream 3 nights weekly  4. Left renal mass  - Discussed with the patient that we could further evaluate this lesion with an MRI or she return in 3 months and we can obatin a renal ultrasound at that time  - RTC in 3 months for RUS (06/2018)   Return for pending urine culture.  These notes generated with voice recognition software. I apologize for typographical errors.  Laneta Simmers   [place] [address] [place], [place] [address] [phone]  I, Zara Council , am acting as a scribe for Nori Riis, PA-C  I have reviewed the above documentation for accuracy and completeness, and I agree with the above.    Zara Council, PA-C  30-minute discussion with patient regarding abnormal lab results, diagnosis possibilities, treatment options, risks and benefits. The patient had few questions and concerns regarding these options and the long-term effects.

## 2018-05-24 ENCOUNTER — Encounter: Payer: Self-pay | Admitting: Urology

## 2018-05-24 ENCOUNTER — Ambulatory Visit: Payer: Medicare HMO | Admitting: Urology

## 2018-05-24 VITALS — BP 121/73 | HR 70 | Ht 62.0 in | Wt 139.0 lb

## 2018-05-24 DIAGNOSIS — Z87440 Personal history of urinary (tract) infections: Secondary | ICD-10-CM | POA: Diagnosis not present

## 2018-05-24 DIAGNOSIS — N952 Postmenopausal atrophic vaginitis: Secondary | ICD-10-CM

## 2018-05-24 DIAGNOSIS — N289 Disorder of kidney and ureter, unspecified: Secondary | ICD-10-CM | POA: Diagnosis not present

## 2018-05-24 DIAGNOSIS — N342 Other urethritis: Secondary | ICD-10-CM | POA: Diagnosis not present

## 2018-05-24 DIAGNOSIS — N8111 Cystocele, midline: Secondary | ICD-10-CM | POA: Diagnosis not present

## 2018-05-24 LAB — URINALYSIS, COMPLETE
BILIRUBIN UA: NEGATIVE
GLUCOSE, UA: NEGATIVE
KETONES UA: NEGATIVE
Nitrite, UA: POSITIVE — AB
PROTEIN UA: NEGATIVE
SPEC GRAV UA: 1.02 (ref 1.005–1.030)
Urobilinogen, Ur: 0.2 mg/dL (ref 0.2–1.0)
pH, UA: 6 (ref 5.0–7.5)

## 2018-05-24 LAB — MICROSCOPIC EXAMINATION: Epithelial Cells (non renal): NONE SEEN /hpf (ref 0–10)

## 2018-05-27 ENCOUNTER — Telehealth: Payer: Self-pay | Admitting: Family Medicine

## 2018-05-27 DIAGNOSIS — A498 Other bacterial infections of unspecified site: Secondary | ICD-10-CM

## 2018-05-27 DIAGNOSIS — B961 Klebsiella pneumoniae [K. pneumoniae] as the cause of diseases classified elsewhere: Principal | ICD-10-CM

## 2018-05-27 LAB — CULTURE, URINE COMPREHENSIVE

## 2018-05-27 MED ORDER — CEPHALEXIN 500 MG PO CAPS: 500.0000 mg | ORAL_CAPSULE | Freq: Two times a day (BID) | ORAL | 0 refills | Status: DC

## 2018-05-27 NOTE — Telephone Encounter (Signed)
Called pt informed her of the information below. Pt states that she has no adverse reactions to Keflex. RX sent. Pt gave verbal understanding.

## 2018-05-27 NOTE — Telephone Encounter (Signed)
We can prescribe Keflex 500 mg, twice daily for seven days.  She needs to take it as prescribed and take all the medication and not cut it in half.  If she cannot tolerate this antibiotic, we will need to wait for ID as the bacteria is resistant to many antibiotics.

## 2018-05-27 NOTE — Telephone Encounter (Signed)
-----   Message from Nori Riis, PA-C sent at 05/27/2018  7:44 AM EST ----- Please let Mrs. Metivier know that her urine culture was positive for infection.  She needs to start Cipro 250 mg, one tablet twice daily for seven days.  She needs to finish all the antibiotic tablets even if she is feeling better.

## 2018-05-27 NOTE — Telephone Encounter (Signed)
Patient states she cannot take Cipro. She states it bothers her tendons in the back of her legs

## 2018-06-06 ENCOUNTER — Ambulatory Visit: Payer: Medicare HMO | Attending: Infectious Diseases | Admitting: Infectious Diseases

## 2018-06-06 ENCOUNTER — Encounter: Payer: Self-pay | Admitting: Infectious Diseases

## 2018-06-06 VITALS — BP 152/89 | HR 61 | Temp 97.7°F | Wt 140.1 lb

## 2018-06-06 DIAGNOSIS — Z87440 Personal history of urinary (tract) infections: Secondary | ICD-10-CM | POA: Diagnosis not present

## 2018-06-06 DIAGNOSIS — Z881 Allergy status to other antibiotic agents status: Secondary | ICD-10-CM | POA: Diagnosis not present

## 2018-06-06 DIAGNOSIS — Z888 Allergy status to other drugs, medicaments and biological substances status: Secondary | ICD-10-CM | POA: Diagnosis not present

## 2018-06-06 DIAGNOSIS — N342 Other urethritis: Secondary | ICD-10-CM | POA: Diagnosis not present

## 2018-06-06 DIAGNOSIS — Z90710 Acquired absence of both cervix and uterus: Secondary | ICD-10-CM

## 2018-06-06 DIAGNOSIS — Z87448 Personal history of other diseases of urinary system: Secondary | ICD-10-CM

## 2018-06-06 DIAGNOSIS — N814 Uterovaginal prolapse, unspecified: Secondary | ICD-10-CM | POA: Diagnosis not present

## 2018-06-06 NOTE — Progress Notes (Signed)
NAME: Rebecca Gutierrez  DOB: [birth year]  MRN: [id number]  Date/Time: 06/06/2018 10:13 AM Subjective:  REASON FOR CONSULT: UTI ? Rebecca Gutierrez is a 77 y.o. female with a history of hysterectomy, cystocele, rectocele, with anterior and posterior repair , now with pessary is referred to me for recurrent UTI. Pt says since Aug 2019 she has been having cloudy urine and burning at the urethra when she passes urine. She says she fell in Aug and thinks her bladder repair has fallen apart making her bladder prolapse more. Also the pessary is not sitting correctly and has fallen in the toilet once. Saw Gyn in nov and had a new pessary. She has removed the pessary as it is making her raw. She also has noted discharge from her vagina and inflammation in the vagina when she looks with a mirror. She also feels her bladder wall /vainal wall protruding. Since Aug 2019 she has taken keflex many courses. She has been given multiple courses of antibiotics-since 2015 has had positive urine culture. Has been followed by urologist Past Medical History:  Diagnosis Date  . Abnormal ECG 10/28/2017  . Anxiety   . Bradycardia 11/10/2017  . Cancer ([place])    skin ca  . Cystocele 09/13/2013   small recurrent, less than before 09/13/13 JVF   . Gilbert's syndrome   . Hearing loss 1975  . History of recurrent urinary tract infection 03/24/2016  . Hyperlipidemia 2000  . Hypertension 2000    Past Surgical History:  Procedure Laterality Date  . ABDOMINAL HYSTERECTOMY    . ANTERIOR AND POSTERIOR REPAIR N/A 05/02/2013   Procedure: ANTERIOR (CYSTOCELE) AND POSTERIOR REPAIR (RECTOCELE);  Surgeon: [name], MD;  Location: [facility];  Service: Gynecology;  Laterality: N/A;  . COLONOSCOPY  08/28/2011   Procedure: COLONOSCOPY;  Surgeon: [name], MD;  [facility];  Service: Endoscopy;  Laterality: N/A;  930  . EYE SURGERY Bilateral 02/13/2011   other 2013  . MOHS SURGERY  06/2016   Removed skin cancer from  Rt shin  . TUBAL LIGATION    . VAGINAL HYSTERECTOMY N/A 05/02/2013   Procedure: HYSTERECTOMY VAGINAL;  Surgeon: [name], MD;  Location: [facility];  Service: Gynecology;  Laterality: N/A;    SH Non smoker Occasional  alcohol   Family History  Problem Relation Age of Onset  . Hypertension Mother   . Cancer Mother        stomach  . Healthy Father   . Healthy Brother   . Colon cancer Neg Hx   . Prostate cancer Neg Hx   . Kidney cancer Neg Hx   . Bladder Cancer Neg Hx   . Breast cancer Neg Hx    Allergies  Allergen Reactions  . Ciprofloxacin     Tendinitis   . Levaquin [Levofloxacin] Other (See Comments)    insomnia  . [name] Diarrhea  . [name]   ? Current Outpatient Medications  Medication Sig Dispense Refill  . aspirin EC 81 MG tablet Take 81 mg by mouth at bedtime.     . cephALEXin (KEFLEX) 500 MG capsule Take 1 capsule (500 mg total) by mouth 2 (two) times daily. 14 capsule 0  . Cholecalciferol (VITAMIN D HIGH POTENCY PO) Take by mouth.    . estradiol (ESTRACE) 0.1 MG/GM vaginal cream Place 1 Applicatorful vaginally at bedtime.    . hydrochlorothiazide (HYDRODIURIL) 25 MG tablet TAKE 1 TABLET DAILY 90 tablet 3  . metoprolol tartrate (LOPRESSOR) 25  MG tablet TAKE ONE AND ONE-HALF TABLETS TWICE A DAY 270 tablet 4  . NON FORMULARY pessary    . ramipril (ALTACE) 10 MG capsule Take 1 capsule (10 mg total) by mouth daily. 90 capsule 3  . simvastatin (ZOCOR) 10 MG tablet TAKE 1 TABLET DAILY 90 tablet 4   No current facility-administered medications for this visit.      Abtx:  Anti-infectives (From admission, onward)   None      REVIEW OF SYSTEMS:  Const: negative fever, negative chills, negative weight loss Eyes: negative diplopia or visual changes, negative eye pain ENT: negative coryza, negative sore throat Resp: negative cough, hemoptysis, dyspnea Cards: negative for chest pain, palpitations, lower extremity edema GU:   frequency, dysuria No hematuria GI: Negative for abdominal pain, diarrhea, bleeding, constipation Skin: negative for rash and pruritus Heme: negative for easy bruising and gum/nose bleeding MS: negative for myalgias, arthralgias, back pain and muscle weakness Neurolo:negative for headaches, dizziness, vertigo, memory problems  Psych: negative for feelings of anxiety, depression  Endocrine: negative for thyroid, diabetes Allergy/Immunology- negative for any medication or food allergies ? Objective:  VITALS:  BP (!) 152/89 (BP Location: Right Arm, Patient Position: Sitting, Cuff Size: Normal)   Pulse 61   Temp 97.7 F (36.5 C) (Oral)   Wt 140 lb 2 oz (63.6 kg)   BMI 25.63 kg/m  PHYSICAL EXAM:  General: Alert, cooperative, no distress, appears stated age.  Head: Normocephalic, without obvious abnormality, atraumatic. Eyes: Conjunctivae clear, anicteric sclerae. Pupils are equal ENT Nares normal. No drainage or sinus tenderness. Lips, mucosa, and tongue normal. No Thrush Neck: Supple, symmetrical, no adenopathy, thyroid: non tender no carotid bruit and no JVD. Back: No CVA tenderness. Lungs: Clear to auscultation bilaterally. No Wheezing or Rhonchi. No rales. Heart: Regular rate and rhythm, no murmur, rub or gallop. Abdomen: Soft, non-tender,not distended. Bowel sounds normal. No masses Extremities: atraumatic, no cyanosis. No edema. No clubbing Skin: No rashes or lesions. Or bruising Lymph: Cervical, supraclavicular normal. Neurologic: Grossly non-focal Genital- prominent cystocele Pertinent Labs Lab Results CBC    Component Value Date/Time   WBC 7.4 10/19/2017 1011   WBC 3.4 (L) 03/29/2015 0912   RBC 5 01/13/2018 1147   RBC 4.49 03/29/2015 0912   HGB 14.5 10/19/2017 1011   HCT 42.6 10/19/2017 1011   PLT 246 10/19/2017 1011   MCV 90 10/19/2017 1011   MCV 91 10/13/2011 1515   MCH 30.8 10/19/2017 1011   MCH 31.2 03/29/2015 0912   MCHC 34.0 10/19/2017 1011   MCHC 35.6  03/29/2015 0912   RDW 14.1 10/19/2017 1011   RDW 13.3 10/13/2011 1515   LYMPHSABS 1.3 10/19/2017 1011   MONOABS 0.3 03/29/2015 0912   EOSABS 0.2 10/19/2017 1011   BASOSABS 0.0 10/19/2017 1011    CMP Latest Ref Rng & Units 03/16/2018 10/19/2017 02/22/2017  Glucose 65 - 99 mg/dL - 100(H) 96  BUN 8 - 23 mg/dL 19 16 17   Creatinine 0.44 - 1.00 mg/dL 0.75 0.79 0.80  Sodium 134 - 144 mmol/L - 142 139  Potassium 3.5 - 5.2 mmol/L - 3.9 3.6  Chloride 96 - 106 mmol/L - 100 97  CO2 20 - 29 mmol/L - 26 26  Calcium 8.7 - 10.3 mg/dL - 9.8 9.5  Total Protein 6.0 - 8.5 g/dL - 7.1 -  Total Bilirubin 0.0 - 1.2 mg/dL - 1.5(H) -  Alkaline Phos 39 - 117 IU/L - 61 -  AST 0 - 40 IU/L - 19 -  ALT 0 - 32 IU/L - 18 -      Microbiology: 05/24/18- Urine culture klebsiella  2015 Uribe culture klebsiella  IMAGING RESULTS: ? Impression/Recommendation ?77 y.o. female with a history of hysterectomy, cystocele, rectocele, with anterior and posterior repair , now with pessary is referred to me for recurrent UTI. Pt says since Aug 2019 she has been having cloudy urine and burning at the urethra when she passes urine. She says she fell in Aug and thinks her bladder repair has fallen apart making her bladder prolapse more. Also the pessary is not sitting correctly and has fallen in the toilet once. Saw Gyn in nov and had a new pessary. She has removed the pessary as it is making her raw. She also has noted discharge from her vagina and inflammation in the vagina when she looks with a mirror. She also feels her bladder wall /vainal wall protruding. Since Aug 2019 she has taken keflex many courses. She has been given multiple courses of antibiotics-since 2015 has had positive urine culture. 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 She has urethritis  Which is likely due to the following She likely has multifactorial  etiology for her symptoms. 1) significant cystocele causing incomplete bladder emptying 2) Post menopausal genitourinary symptoms 3) Vaginal pessary causing local irritation and inflammation. She does not have systemic symptoms from a UTI. Will avoid antibiotics as it is causing her MDR organism and also not giving prolonged relief from symptoms. Need to correct the underlying problems Recommend 1) Post void bladder scan 2) Gyn examination to look for any local inflammation /irritation from pessary 3) Cranberry supplement ( told her to try [place] cranberry) 4) estrace cream topically 5) Cetaphil to clean the genital area ( not soap)  6) wash with water after defecation 7)Probiotic for vaginal health( pearls) 8) increase water consumption 9) Avoid antibiotics unless systemic infection/ or before cystoscopy 10) consider fixing the cystocele as that will help with her symptoms 11) Kegel Exercise  ? ___________________________________________________ Discussed with patient in great detail  Note:  This document was prepared using Dragon voice recognition software and may include unintentional dictation errors.

## 2018-06-06 NOTE — Patient Instructions (Addendum)
  Intermittent symptoms of urethral burning and cloudy urine with chronic colonization with klebseilla which is now MDR bacteria. You have urethritis  urethritis  Which is likely due to the following  1) significant cystocele causing incomplete bladder emptying 2) Post menopausal genitourinary symptoms 3) Vaginal pessary causing local irritation and inflammation. You do not  have systemic symptoms from a UTI. Will avoid antibiotics as it is causing resistant bacteria and also not giving prolonged relief from symptoms. Need to correct the underlying problems Recommend 1) Post void bladder scan 2) Gyn examination to look for any local inflammation /irritation from pessary 3) Cranberry supplement ( told her to try [place] cranberry) 4) estrace cream topically 5) Cetaphil to clean the genital area ( not soap)  6) wash with water after defecation 7)Probiotic for vaginal health( pearls) 8) increase water consumption 9) Avoid antibiotics unless systemic infection/ or before cystoscopy 10) consider fixing the cystocele 11) Kegel exercise

## 2018-06-27 ENCOUNTER — Ambulatory Visit
Admission: RE | Admit: 2018-06-27 | Discharge: 2018-06-27 | Disposition: A | Discharge status: Home / Self Care | Payer: Medicare HMO | Source: Ambulatory Visit | Attending: Urology | Admitting: Urology

## 2018-06-27 DIAGNOSIS — N289 Disorder of kidney and ureter, unspecified: Secondary | ICD-10-CM | POA: Insufficient documentation

## 2018-06-27 DIAGNOSIS — N281 Cyst of kidney, acquired: Secondary | ICD-10-CM | POA: Insufficient documentation

## 2018-06-30 ENCOUNTER — Other Ambulatory Visit: Payer: Self-pay

## 2018-06-30 ENCOUNTER — Encounter: Payer: Self-pay | Admitting: Urology

## 2018-06-30 ENCOUNTER — Ambulatory Visit (INDEPENDENT_AMBULATORY_CARE_PROVIDER_SITE_OTHER): Payer: Medicare HMO | Admitting: Urology

## 2018-06-30 VITALS — BP 119/74 | HR 66 | Ht 62.0 in | Wt 140.0 lb

## 2018-06-30 DIAGNOSIS — N289 Disorder of kidney and ureter, unspecified: Secondary | ICD-10-CM | POA: Diagnosis not present

## 2018-06-30 DIAGNOSIS — N952 Postmenopausal atrophic vaginitis: Secondary | ICD-10-CM

## 2018-06-30 DIAGNOSIS — N8111 Cystocele, midline: Secondary | ICD-10-CM

## 2018-06-30 DIAGNOSIS — Z87440 Personal history of urinary (tract) infections: Secondary | ICD-10-CM | POA: Diagnosis not present

## 2018-06-30 LAB — BLADDER SCAN AMB NON-IMAGING

## 2018-06-30 NOTE — Progress Notes (Signed)
06/30/2018  11:43 AM   Rebecca Gutierrez 1940-11-22 [id number]  Referring provider: [name], MD [address] [place], [place] [address]  Chief Complaint  Patient presents with  . Results    HPI: Rebecca Gutierrez is a 78 y.o. White or Caucasian female that has a history of recurrent UTI's, vaginal atrophy and complex left renal cyst. She is reporting today to discuss the results of her recent RUS.  Complex left renal cyst: CT w/wo on 03/16/2018 revealed no findings to explain the patient's history of recurrent UTI.  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  No right-sided hydronephrosis. Appearance on recent ultrasound was due to prominent central sinus cysts in both kidneys. 3. 8 mm low-density lesion in the interpolar left kidney cannot be definitively characterized but has attenuation higher than would be expected for a simple cyst. This may be a cyst complicated by proteinaceous debris or hemorrhage. Follow-up could be used to ensure stability. MRI may be able to definitively characterize this lesion and preclude the need for additional follow-up imaging.  Small hiatal hernia.  Pelvic floor laxity.  Aortic Atherosclerois   RUS on 06/27/2018 noted previously seen renal sinus cysts bilaterally, indeterminate lesion within left kidney that does not fulfill simple cyst criteria.   Recurrent UTIs: Risk Factors include age, vaginal atrophy and limited fluid intake  RUS on 03/02/2018 revealed mild right sided hydronephrosis v. Extrarenal pelvis, no dilation of calices. Extrarenal pelvis of the left kidney, no other focal abnormality.  Cystoscopy on 03/07/2018 was negative.  CT w/wo on 03/16/2018 as above. revealed no findings to explain patients history of rUTIs.   Her last documented UTI was on 05/24/2018. Culture grew klebsiella pneumoniae. She reports that she has not had a UTI symptoms  since.  Since our last visit she has seen an infectious disease specialist who confirmed my recommendation of concentrated cranberry juice>sweetened cranberry juice, crede maneuver to ensure complete bladder emptying and implementation of probiotics.   She had her pessary in for 6 weeks and took it out on 06/01/2018. She removed it due to yellowish discharge that caused the pessary to move and dislodge (possibly due to wrong sizing). She believes that she is emptying well without it. She reports daytime frequency. Patient is interested in bladder tack surgery.  PVR today is 184 mL.   Vaginal Atrophy: She is using the vaginal estrogen cream MWF nights weekly   PMH: Past Medical History:  Diagnosis Date  . Abnormal ECG 10/28/2017  . Anxiety   . Bradycardia 11/10/2017  . Cancer ([place])    skin ca  . Cystocele 09/13/2013   small recurrent, less than before 09/13/13 JVF   . Gilbert's syndrome   . Hearing loss 1975  . History of recurrent urinary tract infection 03/24/2016  . Hyperlipidemia 2000  . Hypertension 2000   Surgical History: Past Surgical History:  Procedure Laterality Date  . ABDOMINAL HYSTERECTOMY    . ANTERIOR AND POSTERIOR REPAIR N/A 05/02/2013   Procedure: ANTERIOR (CYSTOCELE) AND POSTERIOR REPAIR (RECTOCELE);  Surgeon: [name], MD;  Location: [facility];  Service: Gynecology;  Laterality: N/A;  . COLONOSCOPY  08/28/2011   Procedure: COLONOSCOPY;  Surgeon: [name], MD;  [facility]: [facility];  Service: Endoscopy;  Laterality: N/A;  930  . EYE SURGERY Bilateral 02/13/2011   other 2013  . 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  TUBAL LIGATION    . VAGINAL HYSTERECTOMY N/A 05/02/2013   Procedure: HYSTERECTOMY VAGINAL;  Surgeon: [name], MD;  Location: [facility];  Service: Gynecology;  Laterality: N/A;   Home Medications:  Allergies as of 06/30/2018      Reactions   Ciprofloxacin    Tendinitis    Levaquin [levofloxacin] Other (See  Comments)   insomnia   [name] Diarrhea   [name]       Medication List       Accurate as of June 30, 2018 11:43 AM. Always use your most recent med list.        aspirin EC 81 MG tablet Take 81 mg by mouth at bedtime.   estradiol 0.1 MG/GM vaginal cream Commonly known as:  ESTRACE Place 1 Applicatorful vaginally at bedtime.   hydrochlorothiazide 25 MG tablet Commonly known as:  HYDRODIURIL TAKE 1 TABLET DAILY   metoprolol tartrate 25 MG tablet Commonly known as:  LOPRESSOR TAKE ONE AND ONE-HALF TABLETS TWICE A DAY   NON FORMULARY pessary   ramipril 10 MG capsule Commonly known as:  ALTACE Take 1 capsule (10 mg total) by mouth daily.   simvastatin 10 MG tablet Commonly known as:  ZOCOR TAKE 1 TABLET DAILY   VITAMIN D HIGH POTENCY PO Take by mouth.       Allergies:  Allergies  Allergen Reactions  . Ciprofloxacin     Tendinitis   . Levaquin [Levofloxacin] Other (See Comments)    insomnia  . [name] Diarrhea  . [name]     Family History: Family History  Problem Relation Age of Onset  . Hypertension Mother   . Cancer Mother        stomach  . Healthy Father   . Healthy Brother   . Colon cancer Neg Hx   . Prostate cancer Neg Hx   . Kidney cancer Neg Hx   . Bladder Cancer Neg Hx   . Breast cancer Neg Hx     Social History:  reports that she has never smoked. She has never used smokeless tobacco. She reports that she does not drink alcohol or use drugs.  ROS: UROLOGY Frequent Urination?: No Hard to postpone urination?: No Burning/pain with urination?: No Get up at night to urinate?: No Leakage of urine?: No Urine stream starts and stops?: No Trouble starting stream?: No Do you have to strain to urinate?: No Blood in urine?: No Urinary tract infection?: No Sexually transmitted disease?: No Injury to kidneys or bladder?: No Painful intercourse?: No Weak stream?:  No Currently pregnant?: No Vaginal bleeding?: No Last menstrual period?: n  Gastrointestinal Nausea?: No Vomiting?: No Indigestion/heartburn?: No Diarrhea?: No Constipation?: No  Constitutional Fever: No Night sweats?: No Weight loss?: No Fatigue?: No  Skin Skin rash/lesions?: No Itching?: No  Eyes Blurred vision?: No Double vision?: No  Ears/Nose/Throat Sore throat?: No Sinus problems?: No  Hematologic/Lymphatic Swollen glands?: No Easy bruising?: No  Cardiovascular Leg swelling?: No Chest pain?: No  Respiratory Cough?: No Shortness of breath?: No  Endocrine Excessive thirst?: No  Musculoskeletal Back pain?: No Joint pain?: No  Neurological Headaches?: No Dizziness?: No  Psychologic Depression?: No Anxiety?: No  Physical Exam: BP 119/74   Pulse 66   Ht 5\' 2"  (1.575 m)   Wt 140 lb (63.5 kg)   BMI 25.61 kg/m   Constitutional:  Alert and oriented, No acute distress. Respiratory: Normal respiratory effort, no increased work of breathing. GU: No CVA tenderness Skin: No rashes, bruises or  suspicious lesions. Neurologic: Grossly intact, no focal deficits, moving all 4 extremities. Psychiatric: Normal mood and affect.  Pertinent Imaging: Results for orders placed during the hospital encounter of 06/27/18  US RENAL   Narrative CLINICAL DATA:  Follow-up left renal lesion  EXAM: RENAL / URINARY TRACT ULTRASOUND COMPLETE  COMPARISON:  03/16/2018, MRI of the lumbar spine from 12/08/2016  FINDINGS: Right Kidney:  Renal measurements: 8.6 x 4.4 x 4.8 cm. = volume: 95 mL. Renal sinus cysts are again identified similar to that seen on recent CT examination.  Left Kidney:  Renal measurements: 10.6 x 4.5 x 4.4 cm. = volume: 110 mL. Renal sinus cysts are noted similar to that seen on the prior CT examination. The 8 mm lesion seen on recent CT examination is again identified with mild increased echogenicity consistent with a mildly complex  cyst.  Bladder:  Appears normal for degree of bladder distention.  IMPRESSION: Renal sinus cysts are again identified bilaterally.  The previously seen indeterminate lesion within the left kidney is again demonstrated although does not fulfill simple cyst criteria. This again may represent a proteinaceous or hemorrhagic cysts. This was shown to represent a simple cyst in June of 2018 on prior lumbar spine MRI and is stable in appearance.   Electronically Signed   By: Inez Catalina M.D.   On: 06/27/2018 10:50   I have reviewed the images.  Assessment & Plan:   1. Recurrent UTIs Removed her pessary 6 weeks ago, but believes she is emptying well without it. PVR today is 184 mL. Patient is interested in bladder tack surgery. But would like to defer a referral to Urogynecology at this time She is not interested in continuing pessary therapy. Re-explained crede maneuver and discussed its benefits in emptying bladder. Re-discussed benefits of cranberry concentrate implementation. Patient remains skeptical.  2. Complex Left Renal Cyst Noted on Renal US on 06/27/2018 (see above) Patient would like to monitor her cyst every 3 months  Return in about 3 months (around 09/29/2018) for RUS report .  Zara Council, PA-C [facility] [address], [place], [place] [address] [phone]  I, Temidayo Atanda-Ogunleye , am acting as a scribe for [facility], PA-C  I have reviewed the above documentation for accuracy and completeness, and I agree with the above.    Zara Council, PA-C

## 2018-07-10 ENCOUNTER — Other Ambulatory Visit: Payer: Self-pay | Admitting: Internal Medicine

## 2018-07-19 ENCOUNTER — Ambulatory Visit: Payer: Medicare HMO | Admitting: Urology

## 2018-08-09 NOTE — Progress Notes (Signed)
08/10/2018  3:05 PM   Rebecca Gutierrez [birth year] [id number]  Referring provider: [name], MD [address] [place], [place] [address]  Chief Complaint  Patient presents with  . History of recurrent UTI    HPI: Rebecca Gutierrez is a 78 y.o. White or Caucasian female that has a history of recurrent UTI's, vaginal atrophy and complex left renal cyst, who presents today complaining of a possible UTI.  Complex left renal cyst: CT w/wo on 03/16/2018 revealed no findings to explain the patient's history of recurrent UTI.  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  No right-sided hydronephrosis. Appearance on recent ultrasound was due to prominent central sinus cysts in both kidneys. 3. 8 mm low-density lesion in the interpolar left kidney cannot be definitively characterized but has attenuation higher than would be expected for a simple cyst. This may be a cyst complicated by proteinaceous debris or hemorrhage. Follow-up could be used to ensure stability. MRI may be able to definitively characterize this lesion and preclude the need for additional follow-up imaging.  Small hiatal hernia.  Pelvic floor laxity.  Aortic Atherosclerois   RUS on 06/27/2018 noted previously seen renal sinus cysts bilaterally, indeterminate lesion within left kidney that does not fulfill simple cyst criteria.   Recurrent UTIs: Risk Factors include age, vaginal atrophy and limited fluid intake  RUS on 03/02/2018 revealed mild right sided hydronephrosis v. Extrarenal pelvis, no dilation of calices. Extrarenal pelvis of the left kidney, no other focal abnormality.  Cystoscopy on 03/07/2018 was negative.  CT w/wo on 03/16/2018 as above. revealed no findings to explain patients history of rUTIs.   Her last documented UTI was on 05/24/2018. Culture grew klebsiella pneumoniae. She reported on 06/30/2018 that she had not had a UTI  symptoms since.  Between her 06/30/2018 and 05/24/2018 visits, she had seen an infectious disease specialist who confirmed my recommendation of concentrated cranberry juice>sweetened cranberry juice, crede maneuver to ensure complete bladder emptying and implementation of probiotics.   She had her pessary in for 6 weeks and took it out on 06/01/2018. She removed it due to yellowish discharge that caused the pessary to move and dislodge (possibly due to wrong sizing). She believes that she is emptying well without it. She reports daytime frequency. Patient was interested in bladder tack surgery on 06/30/2018.  Her PVR on that visit was 184 mL.   On 08/10/2018, patient her symptoms (frequency, dysuria) started last Tuesday, she started drinking 'a lot' of cranberry juice (diet, and concentrated 2 x a day, 2 oz diluted with water), and symptoms went away about two days ago.  She reported the pain and chills on voiding only lasted a single day with high frequency (every 1-1.5 hr).  She reports that her urine was intermittently cloudy, and symptoms have been intermittant.  Patient reports left back ache Friday night that faded.  Patient reports no symptoms today.  Patient admits nocturia, 2 x last night, and frequency x 2 hrs.  Patient admits she was drinking concentrate cranberry juice during the 9 weeks she was without symptoms.  Vaginal Atrophy: She is using the vaginal estrogen cream MWF nights weekly   PMH: Past Medical History:  Diagnosis Date  . Abnormal ECG 10/28/2017  . Anxiety   . Bradycardia 11/10/2017  . Cancer ([place])    skin ca  . Cystocele 09/13/2013   small recurrent, less than before 09/13/13 JVF   .  Gilbert's syndrome   . Hearing loss 1975  . History of recurrent urinary tract infection 03/24/2016  . Hyperlipidemia 2000  . Hypertension 2000   Surgical History: Past Surgical History:  Procedure Laterality Date  . ABDOMINAL HYSTERECTOMY    . ANTERIOR AND POSTERIOR REPAIR N/A  05/02/2013   Procedure: ANTERIOR (CYSTOCELE) AND POSTERIOR REPAIR (RECTOCELE);  Surgeon: [name], MD;  Location: [facility];  Service: Gynecology;  Laterality: N/A;  . COLONOSCOPY  08/28/2011   Procedure: COLONOSCOPY;  Surgeon: [name], MD;  [facility];  Service: Endoscopy;  Laterality: N/A;  930  . EYE SURGERY Bilateral 02/13/2011   other 2013  . 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 TUBAL LIGATION    . VAGINAL HYSTERECTOMY N/A 05/02/2013   Procedure: HYSTERECTOMY VAGINAL;  Surgeon: [name], MD;  Location: [facility];  Service: Gynecology;  Laterality: N/A;   Home Medications:  Allergies as of 08/10/2018      Reactions   Ciprofloxacin    Tendinitis    Levaquin [levofloxacin] Other (See Comments)   insomnia   [name] Diarrhea   [name]       Medication List       Accurate as of August 10, 2018  3:05 PM. Always use your most recent med list.        aspirin EC 81 MG tablet Take 81 mg by mouth at bedtime.   estradiol 0.1 MG/GM vaginal cream Commonly known as:  ESTRACE Place 1 Applicatorful vaginally at bedtime.   hydrochlorothiazide 25 MG tablet Commonly known as:  HYDRODIURIL TAKE 1 TABLET DAILY   metoprolol tartrate 25 MG tablet Commonly known as:  LOPRESSOR TAKE ONE AND ONE-HALF TABLETS TWICE A DAY   NON FORMULARY pessary   ramipril 10 MG capsule Commonly known as:  ALTACE Take 1 capsule (10 mg total) by mouth daily.   simvastatin 10 MG tablet Commonly known as:  ZOCOR TAKE 1 TABLET DAILY   VITAMIN D HIGH POTENCY PO Take by mouth.       Allergies:  Allergies  Allergen Reactions  . Ciprofloxacin     Tendinitis   . Levaquin [Levofloxacin] Other (See Comments)    insomnia  . [name] Diarrhea  . [name]     Family History: Family History  Problem Relation Age of Onset  . Hypertension Mother   . Cancer Mother        stomach  .  Healthy Father   . Healthy Brother   . Colon cancer Neg Hx   . Prostate cancer Neg Hx   . Kidney cancer Neg Hx   . Bladder Cancer Neg Hx   . Breast cancer Neg Hx     Social History:  reports that she has never smoked. She has never used smokeless tobacco. She reports that she does not drink alcohol or use drugs.  ROS: UROLOGY Frequent Urination?: Yes Hard to postpone urination?: No Burning/pain with urination?: Yes Get up at night to urinate?: Yes Leakage of urine?: No Urine stream starts and stops?: No Trouble starting stream?: No Do you have to strain to urinate?: No Blood in urine?: No Urinary tract infection?: Yes Sexually transmitted disease?: No Injury to kidneys or bladder?: No Painful intercourse?: No Weak stream?: No Currently pregnant?: No Vaginal bleeding?: No Last menstrual period?: n  Gastrointestinal Nausea?: No Vomiting?: No Indigestion/heartburn?: No Diarrhea?: No Constipation?: No  Constitutional Fever: No Night sweats?: No Weight loss?: No  Fatigue?: No  Skin Skin rash/lesions?: No Itching?: No  Eyes Blurred vision?: No Double vision?: No  Ears/Nose/Throat Sore throat?: No Sinus problems?: No  Hematologic/Lymphatic Swollen glands?: No Easy bruising?: No  Cardiovascular Leg swelling?: No Chest pain?: No  Respiratory Cough?: No Shortness of breath?: No  Endocrine Excessive thirst?: No  Musculoskeletal Back pain?: No Joint pain?: No  Neurological Headaches?: No Dizziness?: No  Psychologic Depression?: No Anxiety?: No  Physical Exam: BP (!) 162/80 (BP Location: Left Arm, Patient Position: Sitting)   Pulse 82   Ht 5\' 2"  (1.575 m)   Wt 142 lb (64.4 kg)   BMI 25.97 kg/m   Constitutional: Well nourished. Alert and oriented, No acute distress. Cardiovascular: No clubbing, cyanosis, or edema. Respiratory: Normal respiratory effort, no increased work of breathing. Skin: No rashes, bruises or suspicious  lesions. Neurologic: Grossly intact, no focal deficits, moving all 4 extremities. Psychiatric: Normal mood and affect.   Laboratory Data: Lab Results  Component Value Date   WBC 7.4 10/19/2017   HGB 14.5 10/19/2017   HCT 42.6 10/19/2017   MCV 90 10/19/2017   PLT 246 10/19/2017   Lab Results  Component Value Date   CREATININE 0.75 03/16/2018   Lab Results  Component Value Date   TSH 3.250 10/19/2017      Component Value Date/Time   CHOL 194 10/19/2017 1011   HDL 72 10/19/2017 1011   CHOLHDL 2.7 10/19/2017 1011   CHOLHDL 2.1 09/25/2015 0822   VLDL 13 09/25/2015 0822   LDLCALC 99 10/19/2017 1011   Lab Results  Component Value Date   AST 19 10/19/2017   Lab Results  Component Value Date   ALT 18 10/19/2017   Urinalysis WBC 11-30, few bacteria, BLO trace-intact, LEU 1+, cloudy, otherwise negative.  See Epic.  I have reviewed the labs.  Assessment & Plan:    1. Recurrent UTIs - Removed her pessary on 06/01/2018, but believes she is emptying well without it.  PVR on 06/30/2018 was 184 mL. - She is not interested in continuing pessary therapy. - Re-discussed benefits of cranberry concentrate implementation. Patient seems to be no longer skeptical and very pleased with effectiveness. - Current UTI seems to be resolving - Will however send for urine culture in case symptoms worsen again  2. Complex Left Renal Cyst - Noted on Renal US on 06/27/2018 (see above) - Patient would like to monitor her cyst every 3 months  Return for Icard .  Zara Council, PA-C  [facility] [address] [place], [place] [address] [phone]  I, Adele Schilder, am acting as a Education administrator for [employer], PA-C.   I have reviewed the above documentation for accuracy and completeness, and I agree with the above.    Zara Council, PA-C

## 2018-08-10 ENCOUNTER — Ambulatory Visit (INDEPENDENT_AMBULATORY_CARE_PROVIDER_SITE_OTHER): Payer: Medicare HMO | Admitting: Urology

## 2018-08-10 ENCOUNTER — Encounter: Payer: Self-pay | Admitting: Urology

## 2018-08-10 VITALS — BP 162/80 | HR 82 | Ht 62.0 in | Wt 142.0 lb

## 2018-08-10 DIAGNOSIS — Z87440 Personal history of urinary (tract) infections: Secondary | ICD-10-CM

## 2018-08-10 DIAGNOSIS — R300 Dysuria: Secondary | ICD-10-CM | POA: Diagnosis not present

## 2018-08-10 LAB — URINALYSIS, COMPLETE
Bilirubin, UA: NEGATIVE
Glucose, UA: NEGATIVE
KETONES UA: NEGATIVE
Nitrite, UA: NEGATIVE
Protein, UA: NEGATIVE
Specific Gravity, UA: 1.01 (ref 1.005–1.030)
Urobilinogen, Ur: 0.2 mg/dL (ref 0.2–1.0)
pH, UA: 6 (ref 5.0–7.5)

## 2018-08-10 LAB — MICROSCOPIC EXAMINATION
Epithelial Cells (non renal): NONE SEEN /hpf (ref 0–10)
RBC, UA: NONE SEEN /hpf (ref 0–2)

## 2018-08-10 NOTE — Addendum Note (Signed)
Addended by: Zara Council A on: 08/10/2018 03:06 PM   Modules accepted: Orders

## 2018-08-13 LAB — CULTURE, URINE COMPREHENSIVE

## 2018-08-15 ENCOUNTER — Telehealth: Payer: Self-pay

## 2018-08-15 NOTE — Telephone Encounter (Signed)
Okay, we will hold on prescribing an antibiotic at this time.  She has a follow up appointment in April for a repeat RUS and office visit.  If she should develop symptoms prior to this, please have her call us.

## 2018-08-15 NOTE — Telephone Encounter (Signed)
-----   Message from Nori Riis, PA-C sent at 08/15/2018  8:01 AM EST ----- Please let Rebecca Gutierrez know that her urine culture was positive, but as we discussed, she was feeling better when she was in the office.  Is she feeling well? Or is she starting to have dysuria again?

## 2018-08-15 NOTE — Telephone Encounter (Signed)
Called pt informed her of results. Pt states that she is asymptomatic at this time. Pt notes that she is drinking lots of cranberry juice, urine has cleared up and is no longer cloudy.

## 2018-08-17 ENCOUNTER — Ambulatory Visit: Payer: Medicare HMO | Admitting: Obstetrics & Gynecology

## 2018-08-29 DIAGNOSIS — M79674 Pain in right toe(s): Secondary | ICD-10-CM | POA: Diagnosis not present

## 2018-08-29 DIAGNOSIS — L03031 Cellulitis of right toe: Secondary | ICD-10-CM | POA: Diagnosis not present

## 2018-08-29 DIAGNOSIS — B351 Tinea unguium: Secondary | ICD-10-CM | POA: Diagnosis not present

## 2018-09-06 ENCOUNTER — Telehealth: Payer: Self-pay | Admitting: Urology

## 2018-09-06 NOTE — Telephone Encounter (Signed)
Spoke to patient and scheduled Virtual visit.

## 2018-09-06 NOTE — Telephone Encounter (Signed)
Pt is having frequent urination w/ pain, She would like an antibiotic called in. Cephalexin . Please advise.

## 2018-09-06 NOTE — Telephone Encounter (Signed)
Please advise 

## 2018-09-06 NOTE — Telephone Encounter (Signed)
We will need to schedule a virtual visit.

## 2018-09-07 ENCOUNTER — Telehealth (INDEPENDENT_AMBULATORY_CARE_PROVIDER_SITE_OTHER): Payer: Medicare HMO | Admitting: Urology

## 2018-09-07 ENCOUNTER — Other Ambulatory Visit: Payer: Self-pay

## 2018-09-07 DIAGNOSIS — Z87440 Personal history of urinary (tract) infections: Secondary | ICD-10-CM

## 2018-09-07 DIAGNOSIS — N289 Disorder of kidney and ureter, unspecified: Secondary | ICD-10-CM

## 2018-09-07 DIAGNOSIS — N952 Postmenopausal atrophic vaginitis: Secondary | ICD-10-CM | POA: Diagnosis not present

## 2018-09-07 MED ORDER — CEPHALEXIN 500 MG PO CAPS: 500.0000 mg | ORAL_CAPSULE | Freq: Two times a day (BID) | ORAL | 0 refills | Status: DC

## 2018-09-07 MED ORDER — ESTRADIOL 0.1 MG/GM VA CREA: TOPICAL_CREAM | VAGINAL | 12 refills | Status: DC

## 2018-09-07 NOTE — Progress Notes (Signed)
08/10/2018  9:26 AM   Rebecca Gutierrez May 08, 1941 [id number]  Referring provider: [name], [address] [place], [place] [address]  No chief complaint on file.   HPI: Rebecca Gutierrez is a 78 y.o. White or Caucasian female that has a history of recurrent UTI's, vaginal atrophy and complex left renal cyst, who presents today complaining of a possible UTI.  [name] was at home and consented to the service today provider by telemedicine.  I am located at the [facility] office for [place].    Virtual Visit via Telephone Note I connected with Rebecca Gutierrez on 09/07/18 at  9:00 AM EDT by telephone and verified that I am speaking with the correct person using two identifiers.   I discussed the limitations, risks, security and privacy concerns of performing an evaluation and management service by telephone and the availability of in person appointments. I also discussed with the patient that there may be a patient responsible charge related to this service. The patient expressed understanding and agreed to proceed.   History of Present Illness: Recurrent UTIs: Risk Factors include age, vaginal atrophy and limited fluid intake  RUS on 03/02/2018 revealed mild right sided hydronephrosis v. Extrarenal pelvis, no dilation of calices. Extrarenal pelvis of the left kidney, no other focal abnormality.  Cystoscopy on 03/07/2018 was negative.  CT w/wo on 03/16/2018 as above. revealed no findings to explain patients history of rUTIs.   Her last documented UTI was on 05/24/2018. Culture grew klebsiella pneumoniae. She reported on 06/30/2018 that she had not had a UTI symptoms since.  Between her 06/30/2018 and 05/24/2018 visits, she had seen an infectious disease specialist who confirmed my recommendation of concentrated cranberry juice>sweetened cranberry juice, crede maneuver to ensure complete bladder emptying and implementation of probiotics.    She had her pessary in for 6 weeks and took it out on 06/01/2018. She removed it due to yellowish discharge that caused the pessary to move and dislodge (possibly due to wrong sizing). She believes that she is emptying well without it. She reports daytime frequency. Patient was interested in bladder tack surgery on 06/30/2018.  Her PVR on that visit was 184 mL.   On 08/10/2018, patient her symptoms (frequency, dysuria) started last Tuesday, she started drinking 'a lot' of cranberry juice (diet, and concentrated 2 x a day, 2 oz diluted with water), and symptoms went away about two days ago.  She reported the pain and chills on voiding only lasted a single day with high frequency (every 1-1.5 hr).  She reports that her urine was intermittently cloudy, and symptoms have been intermittant.  Patient reports left back ache Friday night that faded.  Patient reports no symptoms today.  Patient admits nocturia, 2 x last night, and frequency x 2 hrs.  Patient admits she was drinking concentrate cranberry juice during the 9 weeks she was without symptoms.  Today, she states that her symptoms began 6 days ago.  She is having frequency q 30 minutes, dysuria and cloudy urine.  She is drinking cranberry juice and that is helping with the symptoms slightly.  Patient denies any gross hematuria or suprapubic/flank pain.  Patient denies any fevers, chills, nausea or vomiting.   She has not been on an antibiotic for the last 3 months.    Complex left renal cyst: CT w/wo on 03/16/2018 revealed no findings to explain the patient's history of recurrent UTI.  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  No right-sided hydronephrosis. Appearance on recent ultrasound was due to prominent central sinus cysts in both kidneys. 3. 8 mm low-density lesion in the interpolar left kidney cannot be definitively characterized but has attenuation higher than would be expected  for a simple cyst. This may be a cyst complicated by proteinaceous debris or hemorrhage. Follow-up could be used to ensure stability. MRI may be able to definitively characterize this lesion and preclude the need for additional follow-up imaging.  Small hiatal hernia.  Pelvic floor laxity.  Aortic Atherosclerois   RUS on 06/27/2018 noted previously seen renal sinus cysts bilaterally, indeterminate lesion within left kidney that does not fulfill simple cyst criteria.   She has a repeat RUS on 09/29/2018  Vaginal Atrophy: She is using the vaginal estrogen cream MWF nights weekly.  Refill is needed.     Observations/Objective: PMH: Past Medical History:  Diagnosis Date  . Abnormal ECG 10/28/2017  . Anxiety   . Bradycardia 11/10/2017  . Cancer ([place])    skin ca  . Cystocele 09/13/2013   small recurrent, less than before 09/13/13 JVF   . Gilbert's syndrome   . Hearing loss 1975  . History of recurrent urinary tract infection 03/24/2016  . Hyperlipidemia 2000  . Hypertension 2000   Surgical History: Past Surgical History:  Procedure Laterality Date  . ABDOMINAL HYSTERECTOMY    . ANTERIOR AND POSTERIOR REPAIR N/A 05/02/2013   Procedure: ANTERIOR (CYSTOCELE) AND POSTERIOR REPAIR (RECTOCELE);  Surgeon: [name], MD;  Location: [facility];  Service: Gynecology;  Laterality: N/A;  . COLONOSCOPY  08/28/2011   Procedure: COLONOSCOPY;  Surgeon: [name], MD;  Location: [facility];  Service: Endoscopy;  Laterality: N/A;  930  . EYE SURGERY Bilateral 02/13/2011   other 2013  . 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 TUBAL LIGATION    . VAGINAL HYSTERECTOMY N/A 05/02/2013   Procedure: HYSTERECTOMY VAGINAL;  Surgeon: [name], MD;  Location: [facility];  Service: Gynecology;  Laterality: N/A;   Home Medications:  Allergies as of 09/07/2018      Reactions   Ciprofloxacin    Tendinitis    Levaquin [levofloxacin] Other (See Comments)   insomnia   Omnicef  [cefdinir] Diarrhea   [name]       Medication List       Accurate as of September 07, 2018  9:26 AM. Always use your most recent med list.        aspirin EC 81 MG tablet Take 81 mg by mouth at bedtime.   cephALEXin 500 MG capsule Commonly known as:  Keflex Take 1 capsule (500 mg total) by mouth 2 (two) times daily.   estradiol 0.1 MG/GM vaginal cream Commonly known as:  ESTRACE Place 1 Applicatorful vaginally at bedtime.   estradiol 0.1 MG/GM vaginal cream Commonly known as:  ESTRACE VAGINAL Apply 0.5mg  (pea-sized amount)  just inside the vaginal introitus with a finger-tip on Monday, Wednesday and Friday nights.   hydrochlorothiazide 25 MG tablet Commonly known as:  HYDRODIURIL TAKE 1 TABLET DAILY   metoprolol tartrate 25 MG tablet Commonly known as:  LOPRESSOR TAKE ONE AND ONE-HALF TABLETS TWICE A DAY   NON FORMULARY pessary   ramipril 10 MG capsule Commonly known as:  ALTACE Take 1 capsule (10 mg total) by mouth daily.   simvastatin 10 MG tablet Commonly known as:  ZOCOR TAKE 1 TABLET DAILY  VITAMIN D HIGH POTENCY PO Take by mouth.       Allergies:  Allergies  Allergen Reactions  . Ciprofloxacin     Tendinitis   . Levaquin [Levofloxacin] Other (See Comments)    insomnia  . [name] Diarrhea  . [name]     Family History: Family History  Problem Relation Age of Onset  . Hypertension Mother   . Cancer Mother        stomach  . Healthy Father   . Healthy Brother   . Colon cancer Neg Hx   . Prostate cancer Neg Hx   . Kidney cancer Neg Hx   . Bladder Cancer Neg Hx   . Breast cancer Neg Hx     Social History:  reports that she has never smoked. She has never used smokeless tobacco. She reports that she does not drink alcohol or use drugs.  ROS: UROLOGY Frequent Urination?: Yes Hard to postpone urination?: No Burning/pain with urination?: Yes Get up at night to urinate?: Yes Leakage of  urine?: No Urine stream starts and stops?: No Trouble starting stream?: No Do you have to strain to urinate?: No Blood in urine?: No Urinary tract infection?: Yes Sexually transmitted disease?: No Injury to kidneys or bladder?: No Painful intercourse?: No Weak stream?: Yes Currently pregnant?: No Vaginal bleeding?: No Last menstrual period?: n  Gastrointestinal Nausea?: No Vomiting?: No Indigestion/heartburn?: Yes Diarrhea?: No Constipation?: No  Constitutional Fever: No Night sweats?: No Weight loss?: No Fatigue?: No  Skin Skin rash/lesions?: No Itching?: No  Eyes Blurred vision?: No Double vision?: No  Ears/Nose/Throat Sore throat?: No Sinus problems?: No  Hematologic/Lymphatic Swollen glands?: No Easy bruising?: Yes  Cardiovascular Leg swelling?: No Chest pain?: No  Respiratory Cough?: No Shortness of breath?: No  Endocrine Excessive thirst?: No  Musculoskeletal Back pain?: No Joint pain?: No  Neurological Headaches?: No Dizziness?: No  Psychologic Depression?: No Anxiety?: No  Physical Exam: Telephone encounter - no exam performed   Laboratory Data: Lab Results  Component Value Date   WBC 7.4 10/19/2017   HGB 14.5 10/19/2017   HCT 42.6 10/19/2017   MCV 90 10/19/2017   PLT 246 10/19/2017   Lab Results  Component Value Date   CREATININE 0.75 03/16/2018   Lab Results  Component Value Date   TSH 3.250 10/19/2017      Component Value Date/Time   CHOL 194 10/19/2017 1011   HDL 72 10/19/2017 1011   CHOLHDL 2.7 10/19/2017 1011   CHOLHDL 2.1 09/25/2015 0822   VLDL 13 09/25/2015 0822   LDLCALC 99 10/19/2017 1011   Lab Results  Component Value Date   AST 19 10/19/2017   Lab Results  Component Value Date   ALT 18 10/19/2017   I have reviewed the labs.  Assessment & Plan:    1. Recurrent UTIs - Removed her pessary on 06/01/2018, but believes she is emptying well without it.  PVR on 06/30/2018 was 184 mL. - She is not  interested in continuing pessary therapy. - Continue cranberry juice/tablets - Prescription sent in for Keflex 500 mg BID x 7 days - Patient advised to contact office if symptoms worsen or she still has symptoms after completing the antibiotic.    2. Complex Left Renal Cyst - Noted on Renal US on 06/27/2018 (see above) - RUS scheduled for 09/29/2018 - patient advised to keep that appointment and that she will likely be contacted by phone by me for those results   3. Vaginal atrophy - refill  for cream sent to her pharmacy - patient to continue applying the cream MWF  Follow Up Instructions: I discussed the assessment and treatment plan with the patient. The patient was provided an opportunity to ask questions and all were answered. The patient agreed with the plan and demonstrated an understanding of the instructions.   The patient was advised to call back or seek an in-person evaluation if the symptoms worsen or if the condition fails to improve as anticipated.  I provided 25 minutes of non-face-to-face time during this encounter.   Dinesh Ulysse, PA-C

## 2018-09-12 ENCOUNTER — Telehealth: Payer: Self-pay | Admitting: Urology

## 2018-09-12 NOTE — Telephone Encounter (Signed)
Spoke to patient and she is going to complete the ABX and use the Estogen for a longer period. She will call if her symptoms do not improve.

## 2018-09-12 NOTE — Telephone Encounter (Signed)
Pt called to let us know she doesn't feel like meds are working.  [name] told her to call back and let us know if it wasn't working.  She said she might need to come in for a cath specimen.

## 2018-09-12 NOTE — Telephone Encounter (Signed)
Spoke to patient and she states it is not a feeling of a UTI. She feels the urinary symptoms are much better. She states she gets a funny feeling in her head when she has a UTI and the head feeling has not resolved. I questioned if it was a dizziness or foggy feeling she said no just a funny feeling. She states the tip of her urethra is bothering her . I did tell her she should come in for a Cath UA but she does not feel it's is a UTI.

## 2018-09-12 NOTE — Telephone Encounter (Signed)
Please ask Rebecca Gutierrez if she is using her vaginal estrogen cream three night weekly.  Is the funny feeling in her head affecting her vision, causing difficulty in speech or facial drooping?

## 2018-09-12 NOTE — Telephone Encounter (Signed)
Rebecca Gutierrez will need to come in for a CATH UA.

## 2018-09-13 ENCOUNTER — Telehealth: Payer: Self-pay | Admitting: Urology

## 2018-09-13 NOTE — Telephone Encounter (Signed)
Rebecca Gutierrez will need to come in for a cath UA and culture.

## 2018-09-13 NOTE — Telephone Encounter (Signed)
App has been made for 09-14-18 and the patient is aware

## 2018-09-13 NOTE — Telephone Encounter (Signed)
Patient called again today and stated that she knows the ABX is not working and that she wants something to be done? Please call her to discuss her options She was told yesterday that she needed to complete the ABX and use her cream but she said it is not working      [employer]

## 2018-09-13 NOTE — Telephone Encounter (Signed)
Please have pt scheduled for nurse visit for a day that allows for nurse schedule and lab. Thanks

## 2018-09-14 ENCOUNTER — Ambulatory Visit: Payer: Medicare HMO

## 2018-09-14 ENCOUNTER — Other Ambulatory Visit: Payer: Self-pay

## 2018-09-14 ENCOUNTER — Telehealth: Payer: Self-pay

## 2018-09-14 DIAGNOSIS — N390 Urinary tract infection, site not specified: Secondary | ICD-10-CM

## 2018-09-14 LAB — URINALYSIS, COMPLETE
Bilirubin, UA: NEGATIVE
Glucose, UA: NEGATIVE
Ketones, UA: NEGATIVE
Nitrite, UA: POSITIVE — AB
Protein,UA: NEGATIVE
Specific Gravity, UA: 1.02 (ref 1.005–1.030)
Urobilinogen, Ur: 0.2 mg/dL (ref 0.2–1.0)
pH, UA: 6 (ref 5.0–7.5)

## 2018-09-14 LAB — MICROSCOPIC EXAMINATION
RBC, Urine: NONE SEEN /hpf (ref 0–2)
WBC, UA: >30 /hpf — ABNORMAL HIGH (ref 0–5)

## 2018-09-14 NOTE — Telephone Encounter (Signed)
-----   Message from Nori Riis, PA-C sent at 09/14/2018 11:43 AM EDT ----- Please let Mrs. Steffy that we need to wait on her urine culture results prior to prescribing an antibiotic.

## 2018-09-14 NOTE — Telephone Encounter (Signed)
.  mychart

## 2018-09-14 NOTE — Progress Notes (Signed)
In and Out Catheterization  Patient is present today for a I & O catheterization due to UTI. Patient was cleaned and prepped in a sterile fashion with betadine and Lidocaine 2% jelly was instilled into the urethra.  A 14FR cath was inserted, 167ml of urine return was noted, urine was yellow in color. A clean urine sample was collected for UA/CX. Bladder was drained  And catheter was removed with out difficulty.    Preformed by: Blondell Reveal  Follow up/ Additional notes: Waiting on culture results per Zara Council, PA-C

## 2018-09-17 LAB — CULTURE, URINE COMPREHENSIVE

## 2018-09-19 ENCOUNTER — Encounter: Payer: Self-pay | Admitting: Radiology

## 2018-09-19 MED ORDER — CIPROFLOXACIN HCL 500 MG PO TABS: 250.0000 mg | ORAL_TABLET | Freq: Two times a day (BID) | ORAL | 0 refills | Status: AC

## 2018-09-19 NOTE — Telephone Encounter (Signed)
Patient states she can take Cipro without a reaction. Notified her of script sent to pharmacy & to keep RUS appointment. Questions answered. Patient expresses understanding of conversation.

## 2018-09-19 NOTE — Telephone Encounter (Signed)
-----   Message from Nori Riis, PA-C sent at 09/19/2018  9:37 AM EDT ----- If Mrs. Bryden states she can take Cipro without issue, let's start Cipro 250 mg BID x 5 days.  She also needs to keep her RUS appointment on the 16th.

## 2018-09-19 NOTE — Telephone Encounter (Signed)
She says Septra caused a rash from head to toe & made her heart race but the last time she had it was 86. She said she has taken trimethoprim on it's own & Cipro without problems.

## 2018-09-19 NOTE — Telephone Encounter (Signed)
-----   Message from Nori Riis, PA-C sent at 09/19/2018  8:36 AM EDT ----- Please let Rebecca Gutierrez know that her urine culture was positive for infection.   It is resistant to some antibiotics and she is allergic or has ADR to the other oral antibiotics.  Septra is listed as an allergy and I need to know what the reaction to the antibiotic.

## 2018-09-29 ENCOUNTER — Ambulatory Visit
Admission: RE | Admit: 2018-09-29 | Discharge: 2018-09-29 | Disposition: A | Discharge status: Home / Self Care | Payer: Medicare HMO | Source: Ambulatory Visit | Attending: Urology | Admitting: Urology

## 2018-09-29 ENCOUNTER — Other Ambulatory Visit: Payer: Self-pay

## 2018-09-29 DIAGNOSIS — N281 Cyst of kidney, acquired: Secondary | ICD-10-CM | POA: Diagnosis not present

## 2018-09-29 DIAGNOSIS — N289 Disorder of kidney and ureter, unspecified: Secondary | ICD-10-CM | POA: Insufficient documentation

## 2018-09-30 ENCOUNTER — Ambulatory Visit: Payer: Medicare HMO | Admitting: Urology

## 2018-10-04 ENCOUNTER — Telehealth (INDEPENDENT_AMBULATORY_CARE_PROVIDER_SITE_OTHER): Payer: Medicare HMO | Admitting: Urology

## 2018-10-04 ENCOUNTER — Other Ambulatory Visit: Payer: Self-pay

## 2018-10-04 DIAGNOSIS — N281 Cyst of kidney, acquired: Secondary | ICD-10-CM | POA: Diagnosis not present

## 2018-10-04 DIAGNOSIS — N390 Urinary tract infection, site not specified: Secondary | ICD-10-CM

## 2018-10-04 NOTE — Progress Notes (Deleted)
08/10/2018  11:32 AM   Rebecca Gutierrez 04/21/1941 [id number]  Referring provider: [name], [address] [place], [place] [address]  No chief complaint on file.   HPI: Rebecca Gutierrez is a 78 y.o. White or Caucasian female that has a history of recurrent UTI's, vaginal atrophy and complex left renal cyst, who presents today complaining of a possible UTI.  Complex left renal cyst: CT w/wo on 03/16/2018 revealed no findings to explain the patient's history of recurrent UTI.  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  No right-sided hydronephrosis. Appearance on recent ultrasound was due to prominent central sinus cysts in both kidneys. 3. 8 mm low-density lesion in the interpolar left kidney cannot be definitively characterized but has attenuation higher than would be expected for a simple cyst. This may be a cyst complicated by proteinaceous debris or hemorrhage. Follow-up could be used to ensure stability. MRI may be able to definitively characterize this lesion and preclude the need for additional follow-up imaging.  Small hiatal hernia.  Pelvic floor laxity.  Aortic Atherosclerois   RUS on 06/27/2018 noted previously seen renal sinus cysts bilaterally, indeterminate lesion within left kidney that does not fulfill simple cyst criteria.   Recurrent UTIs: Risk Factors include age, vaginal atrophy and limited fluid intake  RUS on 03/02/2018 revealed mild right sided hydronephrosis v. Extrarenal pelvis, no dilation of calices. Extrarenal pelvis of the left kidney, no other focal abnormality.  Cystoscopy on 03/07/2018 was negative.  CT w/wo on 03/16/2018 as above. revealed no findings to explain patients history of rUTIs.   Her last documented UTI was on 05/24/2018. Culture grew klebsiella pneumoniae. She reported on 06/30/2018 that she had not had a UTI symptoms since.  Between her 06/30/2018  and 05/24/2018 visits, she had seen an infectious disease specialist who confirmed my recommendation of concentrated cranberry juice>sweetened cranberry juice, crede maneuver to ensure complete bladder emptying and implementation of probiotics.   She had her pessary in for 6 weeks and took it out on 06/01/2018. She removed it due to yellowish discharge that caused the pessary to move and dislodge (possibly due to wrong sizing). She believes that she is emptying well without it. She reports daytime frequency. Patient was interested in bladder tack surgery on 06/30/2018.  Her PVR on that visit was 184 mL.   On 08/10/2018, patient her symptoms (frequency, dysuria) started last Tuesday, she started drinking 'a lot' of cranberry juice (diet, and concentrated 2 x a day, 2 oz diluted with water), and symptoms went away about two days ago.  She reported the pain and chills on voiding only lasted a single day with high frequency (every 1-1.5 hr).  She reports that her urine was intermittently cloudy, and symptoms have been intermittant.  Patient reports left back ache Friday night that faded.  Patient reports no symptoms today.  Patient admits nocturia, 2 x last night, and frequency x 2 hrs.  Patient admits she was drinking concentrate cranberry juice during the 9 weeks she was without symptoms.  Vaginal Atrophy: She is using the vaginal estrogen cream MWF nights weekly   PMH: Past Medical History:  Diagnosis Date  . Abnormal ECG 10/28/2017  . Anxiety   . Bradycardia 11/10/2017  . Cancer ([place])    skin ca  . Cystocele 09/13/2013   small recurrent, less than before 09/13/13 JVF   . Gilbert's syndrome   . Hearing loss 1975  .  History of recurrent urinary tract infection 03/24/2016  . Hyperlipidemia 2000  . Hypertension 2000   Surgical History: Past Surgical History:  Procedure Laterality Date  . ABDOMINAL HYSTERECTOMY    . ANTERIOR AND POSTERIOR REPAIR N/A 05/02/2013   Procedure: ANTERIOR (CYSTOCELE)  AND POSTERIOR REPAIR (RECTOCELE);  Surgeon: [name], MD;  Location: [facility];  Service: Gynecology;  Laterality: N/A;  . COLONOSCOPY  08/28/2011   Procedure: COLONOSCOPY;  Surgeon: [name], MD;  Location: [facility];  Service: Endoscopy;  Laterality: N/A;  930  . EYE SURGERY Bilateral 02/13/2011   other 2013  . 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 TUBAL LIGATION    . VAGINAL HYSTERECTOMY N/A 05/02/2013   Procedure: HYSTERECTOMY VAGINAL;  Surgeon: [name], MD;  Location: [facility];  Service: Gynecology;  Laterality: N/A;   Home Medications:  Allergies as of 10/04/2018      Reactions   Ciprofloxacin    Tendinitis    Levaquin [levofloxacin] Other (See Comments)   insomnia   [name] Diarrhea   [name] Palpitations, Rash   Last reaction was in 1969      Medication List       Accurate as of October 04, 2018 11:32 AM. Always use your most recent med list.        aspirin EC 81 MG tablet Take 81 mg by mouth at bedtime.   cephALEXin 500 MG capsule Commonly known as:  Keflex Take 1 capsule (500 mg total) by mouth 2 (two) times daily.   estradiol 0.1 MG/GM vaginal cream Commonly known as:  ESTRACE Place 1 Applicatorful vaginally at bedtime.   estradiol 0.1 MG/GM vaginal cream Commonly known as:  ESTRACE VAGINAL Apply 0.5mg  (pea-sized amount)  just inside the vaginal introitus with a finger-tip on Monday, Wednesday and Friday nights.   hydrochlorothiazide 25 MG tablet Commonly known as:  HYDRODIURIL TAKE 1 TABLET DAILY   metoprolol tartrate 25 MG tablet Commonly known as:  LOPRESSOR TAKE ONE AND ONE-HALF TABLETS TWICE A DAY   NON FORMULARY pessary   ramipril 10 MG capsule Commonly known as:  ALTACE Take 1 capsule (10 mg total) by mouth daily.   simvastatin 10 MG tablet Commonly known as:  ZOCOR TAKE 1 TABLET DAILY   VITAMIN D HIGH POTENCY PO Take by mouth.       Allergies:  Allergies   Allergen Reactions  . Ciprofloxacin     Tendinitis   . Levaquin [Levofloxacin] Other (See Comments)    insomnia  . [name] Diarrhea  . [name] Palpitations and Rash    Last reaction was in 1969    Family History: Family History  Problem Relation Age of Onset  . Hypertension Mother   . Cancer Mother        stomach  . Healthy Father   . Healthy Brother   . Colon cancer Neg Hx   . Prostate cancer Neg Hx   . Kidney cancer Neg Hx   . Bladder Cancer Neg Hx   . Breast cancer Neg Hx     Social History:  reports that she has never smoked. She has never used smokeless tobacco. She reports that she does not drink alcohol or use drugs.  ROS:  Physical Exam: There were no vitals taken for this visit.  Constitutional: Well nourished. Alert and oriented, No acute distress. Cardiovascular: No clubbing, cyanosis, or edema. Respiratory: Normal respiratory effort, no increased work of breathing. Skin: No rashes, bruises or suspicious lesions. Neurologic: Grossly intact, no focal deficits, moving all 4 extremities. Psychiatric: Normal mood and affect.   Laboratory Data: Lab Results  Component Value Date   WBC 7.4 10/19/2017   HGB 14.5 10/19/2017   HCT 42.6 10/19/2017   MCV 90 10/19/2017   PLT 246 10/19/2017   Lab Results  Component Value Date   CREATININE 0.75 03/16/2018   Lab Results  Component Value Date   TSH 3.250 10/19/2017      Component Value Date/Time   CHOL 194 10/19/2017 1011   HDL 72 10/19/2017 1011   CHOLHDL 2.7 10/19/2017 1011   CHOLHDL 2.1 09/25/2015 0822   VLDL 13 09/25/2015 0822   LDLCALC 99 10/19/2017 1011   Lab Results  Component Value Date   AST 19 10/19/2017   Lab Results  Component Value Date   ALT 18 10/19/2017   Urinalysis WBC 11-30, few bacteria, BLO trace-intact, LEU 1+, cloudy, otherwise negative.  See Epic.  I have reviewed the labs.   Assessment & Plan:    1. Recurrent UTIs - Removed her pessary on 06/01/2018, but believes she is emptying well without it.  PVR on 06/30/2018 was 184 mL. - She is not interested in continuing pessary therapy. - Re-discussed benefits of cranberry concentrate implementation. Patient seems to be no longer skeptical and very pleased with effectiveness. - Current UTI seems to be resolving - Will however send for urine culture in case symptoms worsen again  2. Complex Left Renal Cyst - Noted on Renal US on 06/27/2018 (see above) - Patient would like to monitor her cyst every 3 months  No follow-ups on file.  Zara Council, PA-C  [facility] [address] [place], [place] [address] [phone]  I, Adele Schilder, am acting as a Education administrator for [employer], PA-C.   I have reviewed the above documentation for accuracy and completeness, and I agree with the above.    Zara Council, PA-C

## 2018-10-04 NOTE — Progress Notes (Signed)
Virtual Visit via Telephone Note  I connected with Rebecca Gutierrez on 10/04/2018 at [phone] by audio/visual and verified that I am speaking with the correct person using two identifiers.  They are located at home.  I am located at my home.    This visit type was conducted due to national recommendations for restrictions regarding the COVID-19 Pandemic (e.g. social distancing).  This format is felt to be most appropriate for this patient at this time.  All issues noted in this document were discussed and addressed.  No physical exam was performed.   I discussed the limitations, risks, security and privacy concerns of performing an evaluation and management service by telephone and the availability of in person appointments. I also discussed with the patient that there may be a patient responsible charge related to this service. The patient expressed understanding and agreed to proceed.   History of Present Illness: Rebecca Gutierrez is a 78 year old Caucasian female with rUTI's, vaginal atrophy and a complex left renal cyst who is contacted to review her RUS results.    RUS on 09/29/2018 noted no evidence of hydronephrosis.  Redemonstration of 1 cm cyst of the lateral cortex of the left kidney.  She recently completed a course of Cipro for a citrobacter freundii UTI.  She has no symptoms of an UTI at this time, but she is fearful that her symptoms will return in the next few days as they frequently do.  She states that she usually starts to have a pain in her urethra.  She feels like she is passing a kidney stone.   No stone was seen on CTU in 03/2018 or on recent RUS.    She is wondering if the fall she sustained in August caused the UTI recurrence.  She landed on her side and did not have a pelvic injury, so that is doubtful.    She continues her cranberry juice and cranberry tablets as recommended by ID.     Observations/Objective: She is dressed appropriately and well groomed.  She does not  appear to be distressed.  Tends to ruminate on her rUTI's issues.    CLINICAL DATA:  78 year old female with complex renal cyst  EXAM: RENAL / URINARY TRACT ULTRASOUND COMPLETE  COMPARISON:  None.  FINDINGS: Right Kidney:  Length: 8.7 cm x 4.7 cm x 5.3 cm, 113 cc. Echogenicity of the renal parenchyma similar to that of the adjacent liver. No hydronephrosis with parapelvic cysts. Flow confirmed in the hilum of the right kidney.  Left Kidney:  Length: 9.8 cm x 5.3 cm x 5.3 cm, 144 cc. Echogenicity of the renal parenchyma symmetric to the contralateral kidney. No hydronephrosis with parapelvic cysts. Anechoic cystic lesion with through transmission in the left renal cortex measures 10 mm with no internal flow or complexity.  Bladder:  Appears normal for degree of bladder distention.  IMPRESSION: No evidence of hydronephrosis.  Redemonstration of 1 cm cyst of the lateral cortex of the left kidney.   Electronically Signed   By: Corrie Mckusick D.O.   On: 09/29/2018 16:11    Assessment and Plan:  1. Left renal cyst Will repeat RUS in one year to note stability of cyst  2. rUTI's Will have Rebecca Gutierrez start her suppressive trimethoprim at this time She will report any breakthrough infections Keep appointment in August 2020 for recheck   Follow Up Instructions:  Rebecca Gutierrez will keep her follow up in August 2020 with me and report any breakthrough infections  if they occur.    I discussed the assessment and treatment plan with the patient. The patient was provided an opportunity to ask questions and all were answered. The patient agreed with the plan and demonstrated an understanding of the instructions.   The patient was advised to call back or seek an in-person evaluation if the symptoms worsen or if the condition fails to improve as anticipated.  I provided 45 minutes of face-to-face time during this encounter.   Demisha Nokes, PA-C

## 2018-10-10 ENCOUNTER — Other Ambulatory Visit: Payer: Self-pay

## 2018-10-10 ENCOUNTER — Ambulatory Visit (INDEPENDENT_AMBULATORY_CARE_PROVIDER_SITE_OTHER): Payer: Medicare HMO

## 2018-10-10 VITALS — BP 126/62 | HR 59 | Ht 62.0 in | Wt 138.0 lb

## 2018-10-10 DIAGNOSIS — Z780 Asymptomatic menopausal state: Secondary | ICD-10-CM | POA: Diagnosis not present

## 2018-10-10 DIAGNOSIS — M810 Age-related osteoporosis without current pathological fracture: Secondary | ICD-10-CM

## 2018-10-10 DIAGNOSIS — Z0000 Encounter for general adult medical examination without abnormal findings: Secondary | ICD-10-CM | POA: Diagnosis not present

## 2018-10-10 DIAGNOSIS — Z1231 Encounter for screening mammogram for malignant neoplasm of breast: Secondary | ICD-10-CM | POA: Diagnosis not present

## 2018-10-10 DIAGNOSIS — M8580 Other specified disorders of bone density and structure, unspecified site: Secondary | ICD-10-CM

## 2018-10-10 NOTE — Patient Instructions (Signed)
[name] , Thank you for taking time to come for your Medicare Wellness Visit. I appreciate your ongoing commitment to your health goals. Please review the following plan we discussed and let me know if I can assist you in the future.   Screening recommendations/referrals: Colonoscopy: done 08/28/11. No longer required.  Mammogram: done 10/12/17. Please call [phone] to schedule your mammogram and bone density screening. Bone Density: done 09/22/13. Recommended yearly ophthalmology/optometry visit for glaucoma screening and checkup Recommended yearly dental visit for hygiene and checkup  Vaccinations: Influenza vaccine: done 04/22/18 Pneumococcal vaccine: done 09/17/14 Tdap vaccine: done 03/14/14 Shingles vaccine: Shingrix discussed. Please contact your pharmacy for coverage information.   Advanced directives: Please bring a copy of your health care power of attorney and living will to the office at your convenience.  Conditions/risks identified: Recommend drinking 6-8 glasses of water per day  Next appointment: Please follow up in one year for your Medicare Annual Wellness visit.     Preventive Care 76 Years and Older, Female Preventive care refers to lifestyle choices and visits with your health care provider that can promote health and wellness. What does preventive care include?  A yearly physical exam. This is also called an annual well check.  Dental exams once or twice a year.  Routine eye exams. Ask your health care provider how often you should have your eyes checked.  Personal lifestyle choices, including:  Daily care of your teeth and gums.  Regular physical activity.  Eating a healthy diet.  Avoiding tobacco and drug use.  Limiting alcohol use.  Practicing safe sex.  Taking low-dose aspirin every day.  Taking vitamin and mineral supplements as recommended by your health care provider. What happens during an annual well check? The services and  screenings done by your health care provider during your annual well check will depend on your age, overall health, lifestyle risk factors, and family history of disease. Counseling  Your health care provider may ask you questions about your:  Alcohol use.  Tobacco use.  Drug use.  Emotional well-being.  Home and relationship well-being.  Sexual activity.  Eating habits.  History of falls.  Memory and ability to understand (cognition).  Work and work Statistician.  Reproductive health. Screening  You may have the following tests or measurements:  Height, weight, and BMI.  Blood pressure.  Lipid and cholesterol levels. These may be checked every 5 years, or more frequently if you are over 76 years old.  Skin check.  Lung cancer screening. You may have this screening every year starting at age 76 if you have a 30-pack-year history of smoking and currently smoke or have quit within the past 15 years.  Fecal occult blood test (FOBT) of the stool. You may have this test every year starting at age 76.  Flexible sigmoidoscopy or colonoscopy. You may have a sigmoidoscopy every 5 years or a colonoscopy every 10 years starting at age 76.  Hepatitis C blood test.  Hepatitis B blood test.  Sexually transmitted disease (STD) testing.  Diabetes screening. This is done by checking your blood sugar (glucose) after you have not eaten for a while (fasting). You may have this done every 1-3 years.  Bone density scan. This is done to screen for osteoporosis. You may have this done starting at age 76.  Mammogram. This may be done every 1-2 years. Talk to your health care provider about how often you should have regular mammograms. Talk with your health care provider about your  test results, treatment options, and if necessary, the need for more tests. Vaccines  Your health care provider may recommend certain vaccines, such as:  Influenza vaccine. This is recommended every year.   Tetanus, diphtheria, and acellular pertussis (Tdap, Td) vaccine. You may need a Td booster every 10 years.  Zoster vaccine. You may need this after age 76.  Pneumococcal 13-valent conjugate (PCV13) vaccine. One dose is recommended after age 76.  Pneumococcal polysaccharide (PPSV23) vaccine. One dose is recommended after age 76. Talk to your health care provider about which screenings and vaccines you need and how often you need them. This information is not intended to replace advice given to you by your health care provider. Make sure you discuss any questions you have with your health care provider. Document Released: 06/28/2015 Document Revised: 02/19/2016 Document Reviewed: 04/02/2015 Elsevier Interactive Patient Education  2017 [place] Prevention in the Home Falls can cause injuries. They can happen to people of all ages. There are many things you can do to make your home safe and to help prevent falls. What can I do on the outside of my home?  Regularly fix the edges of walkways and driveways and fix any cracks.  Remove anything that might make you trip as you walk through a door, such as a raised step or threshold.  Trim any bushes or trees on the path to your home.  Use bright outdoor lighting.  Clear any walking paths of anything that might make someone trip, such as rocks or tools.  Regularly check to see if handrails are loose or broken. Make sure that both sides of any steps have handrails.  Any raised decks and porches should have guardrails on the edges.  Have any leaves, snow, or ice cleared regularly.  Use sand or salt on walking paths during winter.  Clean up any spills in your garage right away. This includes oil or grease spills. What can I do in the bathroom?  Use night lights.  Install grab bars by the toilet and in the tub and shower. Do not use towel bars as grab bars.  Use non-skid mats or decals in the tub or shower.  If you need to sit  down in the shower, use a plastic, non-slip stool.  Keep the floor dry. Clean up any water that spills on the floor as soon as it happens.  Remove soap buildup in the tub or shower regularly.  Attach bath mats securely with double-sided non-slip rug tape.  Do not have throw rugs and other things on the floor that can make you trip. What can I do in the bedroom?  Use night lights.  Make sure that you have a light by your bed that is easy to reach.  Do not use any sheets or blankets that are too big for your bed. They should not hang down onto the floor.  Have a firm chair that has side arms. You can use this for support while you get dressed.  Do not have throw rugs and other things on the floor that can make you trip. What can I do in the kitchen?  Clean up any spills right away.  Avoid walking on wet floors.  Keep items that you use a lot in easy-to-reach places.  If you need to reach something above you, use a strong step stool that has a grab bar.  Keep electrical cords out of the way.  Do not use floor polish or wax that  makes floors slippery. If you must use wax, use non-skid floor wax.  Do not have throw rugs and other things on the floor that can make you trip. What can I do with my stairs?  Do not leave any items on the stairs.  Make sure that there are handrails on both sides of the stairs and use them. Fix handrails that are broken or loose. Make sure that handrails are as long as the stairways.  Check any carpeting to make sure that it is firmly attached to the stairs. Fix any carpet that is loose or worn.  Avoid having throw rugs at the top or bottom of the stairs. If you do have throw rugs, attach them to the floor with carpet tape.  Make sure that you have a light switch at the top of the stairs and the bottom of the stairs. If you do not have them, ask someone to add them for you. What else can I do to help prevent falls?  Wear shoes that:  Do not have  high heels.  Have rubber bottoms.  Are comfortable and fit you well.  Are closed at the toe. Do not wear sandals.  If you use a stepladder:  Make sure that it is fully opened. Do not climb a closed stepladder.  Make sure that both sides of the stepladder are locked into place.  Ask someone to hold it for you, if possible.  Clearly mark and make sure that you can see:  Any grab bars or handrails.  First and last steps.  Where the edge of each step is.  Use tools that help you move around (mobility aids) if they are needed. These include:  Canes.  Walkers.  Scooters.  Crutches.  Turn on the lights when you go into a dark area. Replace any light bulbs as soon as they burn out.  Set up your furniture so you have a clear path. Avoid moving your furniture around.  If any of your floors are uneven, fix them.  If there are any pets around you, be aware of where they are.  Review your medicines with your doctor. Some medicines can make you feel dizzy. This can increase your chance of falling. Ask your doctor what other things that you can do to help prevent falls. This information is not intended to replace advice given to you by your health care provider. Make sure you discuss any questions you have with your health care provider. Document Released: 03/28/2009 Document Revised: 11/07/2015 Document Reviewed: 07/06/2014 Elsevier Interactive Patient Education  2017 [employer].

## 2018-10-10 NOTE — Progress Notes (Signed)
Subjective:   Rebecca Gutierrez is a 78 y.o. female who presents for Medicare Annual (Subsequent) preventive examination.  I connected with  Raileigh Sabater Newhouse on 10/10/18 by a video enabled telemedicine application and verified that I am speaking with the correct person using two identifiers.   The patient's location was her home and the patient expressed understanding and provided consent to proceed with the visit.     Review of Systems:  Cardiac Risk Factors include: advanced age (>31men, >32 women);dyslipidemia;hypertension     Objective:     Vitals: BP 126/62 (BP Location: Left Arm, Patient Position: Sitting, Cuff Size: Normal)   Pulse (!) 59   Ht 5\' 2"  (1.575 m)   Wt 138 lb (62.6 kg)   BMI 25.24 kg/m   Body mass index is 25.24 kg/m.  Advanced Directives 10/10/2018 10/04/2017 09/29/2016 02/10/2016 05/02/2013 04/26/2013 08/28/2011  Does Patient Have a Medical Advance Directive? Yes Yes Yes Yes Patient has advance directive, copy not in chart Patient has advance directive, copy not in chart Patient has advance directive, copy not in chart  Type of Advance Directive Living will;Healthcare Power of [place];Living will Healthcare Power of Attorney Living will;Healthcare Power of [place];Living will [place];Living will [place]  Does patient want to make changes to medical advance directive? - - - - No No -  Copy of [place] in Chart? No - copy requested No - copy requested No - copy requested - Copy requested from family Copy requested from family -  Pre-existing out of facility DNR order (yellow form or pink MOST form) - - - - No No No    Tobacco Social History   Tobacco Use  Smoking Status Never Smoker  Smokeless Tobacco Never Used  Tobacco Comment   smoking cessation materials not required     Counseling given: Not Answered Comment: smoking cessation  materials not required   Clinical Intake:  Pre-visit preparation completed: Yes  Pain : No/denies pain     BMI - recorded: 25.24 Nutritional Status: BMI 25 -29 Overweight Nutritional Risks: None Diabetes: No  How often do you need to have someone help you when you read instructions, pamphlets, or other written materials from your doctor or pharmacy?: 1 - Never  Interpreter Needed?: No  Information entered by :: [name] LPN  Past Medical History:  Diagnosis Date  . Abnormal ECG 10/28/2017  . Anxiety   . Bradycardia 11/10/2017  . Cancer ([place])    skin ca  . Cystocele 09/13/2013   small recurrent, less than before 09/13/13 JVF   . Gilbert's syndrome   . Hearing loss 1975  . History of recurrent urinary tract infection 03/24/2016  . Hyperlipidemia 2000  . Hypertension 2000   Past Surgical History:  Procedure Laterality Date  . ABDOMINAL HYSTERECTOMY  2014   partial  . ANTERIOR AND POSTERIOR REPAIR N/A 05/02/2013   Procedure: ANTERIOR (CYSTOCELE) AND POSTERIOR REPAIR (RECTOCELE);  Surgeon: [name], MD;  Location: [facility];  Service: Gynecology;  Laterality: N/A;  . CATARACT EXTRACTION, BILATERAL    . COLONOSCOPY  08/28/2011   Procedure: COLONOSCOPY;  Surgeon: [name], MD;  Location: [facility];  Service: Endoscopy;  Laterality: N/A;  930  . EYE SURGERY Bilateral 02/13/2011   other 2013  . 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 TUBAL LIGATION    .  VAGINAL HYSTERECTOMY N/A 05/02/2013   Procedure: HYSTERECTOMY VAGINAL;  Surgeon: [name], MD;  Location: [facility];  Service: Gynecology;  Laterality: N/A;   Family History  Problem Relation Age of Onset  . Hypertension Mother   . Cancer Mother        stomach  . Healthy Father   . Healthy Brother   . Colon cancer Neg Hx   . Prostate cancer Neg Hx   . Kidney cancer Neg Hx   . Bladder Cancer Neg Hx   . Breast cancer Neg Hx    Social History   Socioeconomic History  . Marital  status: Widowed    Spouse name: Not on file  . Number of children: 2  . Years of education: Not on file  . Highest education level: 12th grade  Occupational History  . Occupation: Retired  Scientific laboratory technician  . Financial resource strain: Not hard at all  . Food insecurity:    Worry: Never true    Inability: Never true  . Transportation needs:    Medical: No    Non-medical: No  Tobacco Use  . Smoking status: Never Smoker  . Smokeless tobacco: Never Used  . Tobacco comment: smoking cessation materials not required  Substance and Sexual Activity  . Alcohol use: Yes    Comment: one drink per week  . Drug use: No  . Sexual activity: Never    Birth control/protection: Post-menopausal, Surgical  Lifestyle  . Physical activity:    Days per week: 3 days    Minutes per session: 30 min  . Stress: Not at all  Relationships  . Social connections:    Talks on phone: More than three times a week    Gets together: Three times a week    Attends religious service: Never    Active member of club or organization: No    Attends meetings of clubs or organizations: Never    Relationship status: Widowed  Other Topics Concern  . Not on file  Social History Narrative  . Not on file    Outpatient Encounter Medications as of 10/10/2018  Medication Sig  . aspirin EC 81 MG tablet Take 81 mg by mouth at bedtime.   . Cholecalciferol (VITAMIN D HIGH POTENCY PO) Take by mouth.  . estradiol (ESTRACE VAGINAL) 0.1 MG/GM vaginal cream Apply 0.5mg  (pea-sized amount)  just inside the vaginal introitus with a finger-tip on Monday, Wednesday and Friday nights.  . hydrochlorothiazide (HYDRODIURIL) 25 MG tablet TAKE 1 TABLET DAILY  . metoprolol tartrate (LOPRESSOR) 25 MG tablet TAKE ONE AND ONE-HALF TABLETS TWICE A DAY  . Probiotic Product (PROBIOTIC-10 PO) Take by mouth.  . ramipril (ALTACE) 10 MG capsule Take 1 capsule (10 mg total) by mouth daily.  . simvastatin (ZOCOR) 10 MG tablet TAKE 1 TABLET DAILY  .  trimethoprim (TRIMPEX) 100 MG tablet Take 1 tablet by mouth daily.  . [DISCONTINUED] cephALEXin (KEFLEX) 500 MG capsule Take 1 capsule (500 mg total) by mouth 2 (two) times daily.  . [DISCONTINUED] estradiol (ESTRACE) 0.1 MG/GM vaginal cream Place 1 Applicatorful vaginally at bedtime.  . [DISCONTINUED] NON FORMULARY pessary   No facility-administered encounter medications on file as of 10/10/2018.     Activities of Daily Living In your present state of health, do you have any difficulty performing the following activities: 10/10/2018 10/19/2017  Hearing? Y N  Comment wears hearing aids -  Vision? N N  Comment wears glasses -  Difficulty concentrating or making decisions? N  N  Walking or climbing stairs? N N  Dressing or bathing? N N  Doing errands, shopping? N N  Preparing Food and eating ? N -  Using the Toilet? N -  In the past six months, have you accidently leaked urine? Y -  Comment being seen by urology for cystocele -  Do you have problems with loss of bowel control? N -  Managing your Medications? N -  Managing your Finances? N -  Housekeeping or managing your Housekeeping? N -  Some recent data might be hidden    Patient Care Team: [name], MD as PCP - General (Internal Medicine) Jannet Mantis, MD (Dermatology) Hollice Espy, MD as Consulting Physician (Urology) Gae Dry, MD as Referring Physician (Obstetrics and Gynecology)    Assessment:   This is a routine wellness examination for Shirin.  Exercise Activities and Dietary recommendations Current Exercise Habits: Home exercise routine, Type of exercise: walking, Time (Minutes): 30, Frequency (Times/Week): 3, Weekly Exercise (Minutes/Week): 90, Intensity: Mild, Exercise limited by: None identified  Goals    . DIET - INCREASE WATER INTAKE     Recommend to drink at least 6-8 8oz glasses of water per day.       Fall Risk Fall Risk  10/10/2018 10/04/2017 09/29/2016 05/01/2016 04/02/2015   Falls in the past year? 1 Yes No No No  Comment - playing with grandson, kicked ball and lost balance - - -  Number falls in past yr: 1 1 - - -  Injury with Fall? 0 No - - -  Risk for fall due to : - Impaired vision - - -  Risk for fall due to: Comment - wears eyeglasses - - -  Follow up Falls prevention discussed Falls evaluation completed;Education provided;Falls prevention discussed - - -   FALL RISK PREVENTION PERTAINING TO THE HOME:  Any stairs in or around the home? No  If so, do they handrails? No   Home free of loose throw rugs in walkways, pet beds, electrical cords, etc? Yes  Adequate lighting in your home to reduce risk of falls? Yes   ASSISTIVE DEVICES UTILIZED TO PREVENT FALLS:  Life alert? No  Use of a cane, walker or w/c? No  Grab bars in the bathroom? Yes  Shower chair or bench in shower? Yes  Elevated toilet seat or a handicapped toilet? No   DME ORDERS:  DME order needed?  No   TIMED UP AND GO:  Was the test performed? Yes .  Length of time to ambulate 10 feet: 6 sec.   GAIT:  Appearance of gait: Gait stead-fast and without the use of an assistive device.  Education: Fall risk prevention has been discussed.  Intervention(s) required? No   Depression Screen PHQ 2/9 Scores 10/10/2018 10/04/2017 09/29/2016 05/01/2016  PHQ - 2 Score 0 0 0 0  PHQ- 9 Score - 0 - -     Cognitive Function     6CIT Screen 10/10/2018 10/04/2017 09/29/2016  What Year? 0 points 0 points 0 points  What month? 0 points 0 points 0 points  What time? 0 points 0 points 0 points  Count back from 20 0 points 0 points 0 points  Months in reverse 0 points 0 points 0 points  Repeat phrase 0 points 0 points 0 points  Total Score 0 0 0    Immunization History  Administered Date(s) Administered  . Influenza Split 04/01/2015  . Influenza Whole 03/25/2006, 05/25/2011  . Influenza, High  Dose Seasonal PF 03/31/2017, 04/22/2018  . Influenza,inj,Quad PF,6+ Mos 03/01/2013, 03/14/2014,  03/24/2016  . Pneumococcal Conjugate-13 09/17/2014  . Pneumococcal Polysaccharide-23 09/23/2009  . Td 02/04/2004, 03/14/2014  . Zoster 07/17/2010    Qualifies for Shingles Vaccine? Yes  Zostavax completed 2012. Due for Shingrix. Education has been provided regarding the importance of this vaccine. Pt has been advised to call insurance company to determine out of pocket expense. Advised may also receive vaccine at local pharmacy or Health Dept. Verbalized acceptance and understanding.  Tdap: Up to date  Flu Vaccine: Up to date  Pneumococcal Vaccine: Up to date   Screening Tests Health Maintenance  Topic Date Due  . MAMMOGRAM  10/13/2018  . INFLUENZA VACCINE  01/14/2019  . TETANUS/TDAP  03/14/2024  . DEXA SCAN  Completed  . PNA vac Low Risk Adult  Completed    Cancer Screenings:  Colorectal Screening: Completed 08/28/11. No longer required.   Mammogram: Completed 10/12/17. Repeat every year; Ordered today. Pt provided with contact information and advised to call to schedule appt.   Bone Density: Completed 09/22/13. Results reflect  OSTEOPENIA. Repeat every 2 years. Ordered today. Pt provided with contact information and advised to call to schedule appt.   Lung Cancer Screening: (Low Dose CT Chest recommended if Age 56-80 years, 30 pack-year currently smoking OR have quit w/in 15years.) does not qualify.   Additional Screening:  Hepatitis C Screening: no longer required  Vision Screening: Recommended annual ophthalmology exams for early detection of glaucoma and other disorders of the eye. Is the patient up to date with their annual eye exam?  Yes  Who is the provider or what is the name of the office in which the pt attends annual eye exams? [facility]  Dental Screening: Recommended annual dental exams for proper oral hygiene  Community Resource Referral:  CRR required this visit?  No      Plan:     I have personally reviewed and addressed the Medicare Annual Wellness  questionnaire and have noted the following in the patient's chart:  A. Medical and social history B. Use of alcohol, tobacco or illicit drugs  C. Current medications and supplements D. Functional ability and status E.  Nutritional status F.  Physical activity G. Advance directives H. List of other physicians I.  Hospitalizations, surgeries, and ER visits in previous 12 months J.  [place] such as hearing and vision if needed, cognitive and depression L. Referrals and appointments   In addition, I have reviewed and discussed with patient certain preventive protocols, quality metrics, and best practice recommendations. A written personalized care plan for preventive services as well as general preventive health recommendations were provided to patient.   Signed,  [name], LPN Nurse Health Advisor   Nurse Notes: pt doing well and appreciative of virtual visit today

## 2018-10-16 IMAGING — MG MM DIGITAL SCREENING BILAT W/ TOMO W/ CAD
8 series · 8 of 24 positions shown · non-contrast
Comparison: Previous exam(s).

CLINICAL DATA: Screening.

EXAM:
DIGITAL SCREENING BILATERAL MAMMOGRAM WITH TOMO AND CAD

[R CC synth-2D]
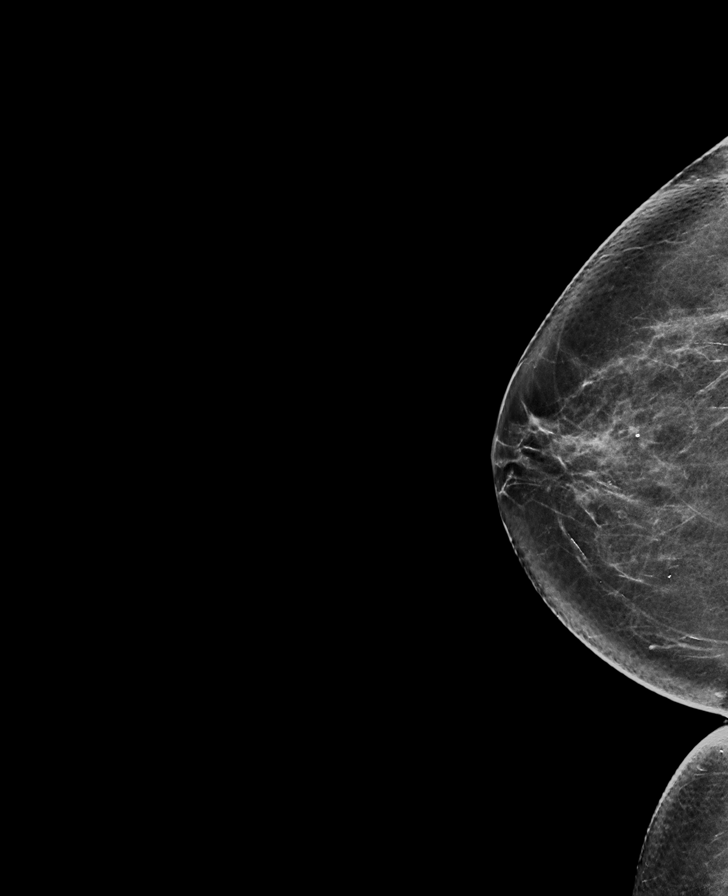

[L MLO synth-2D]
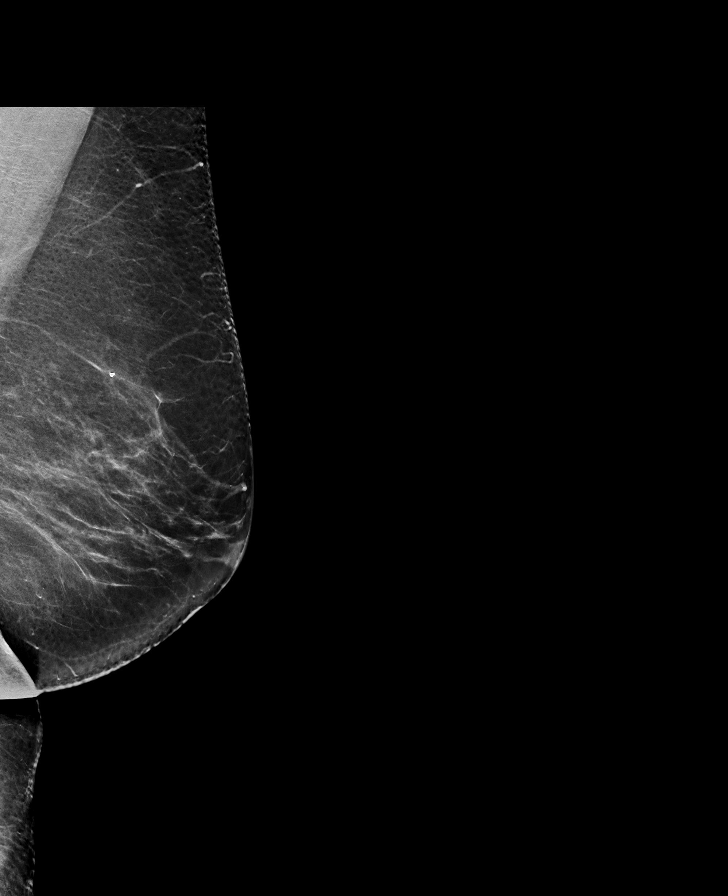

[R MLO synth-2D]
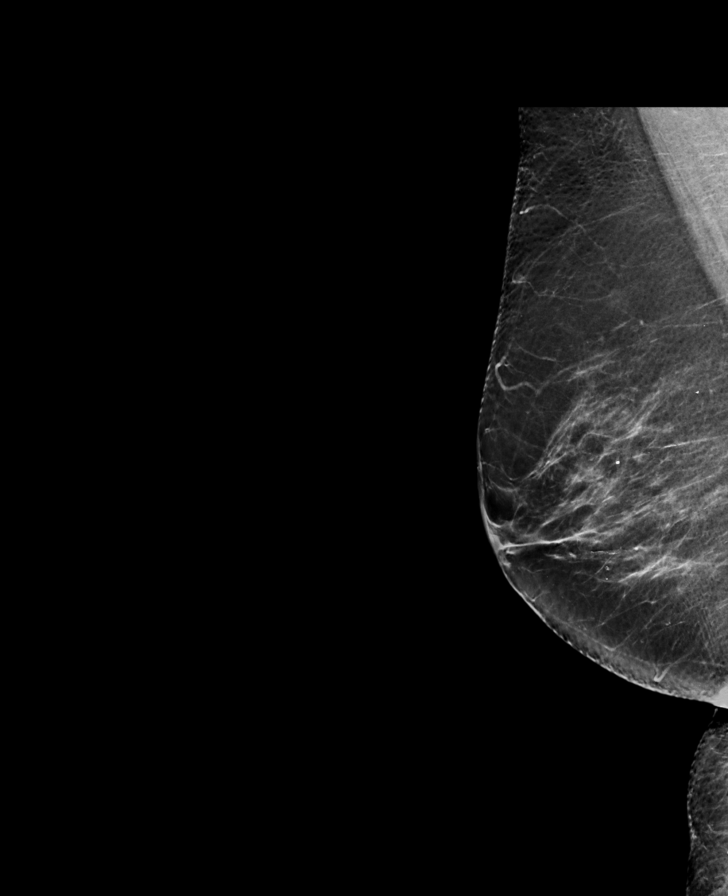

[L CC synth-2D]
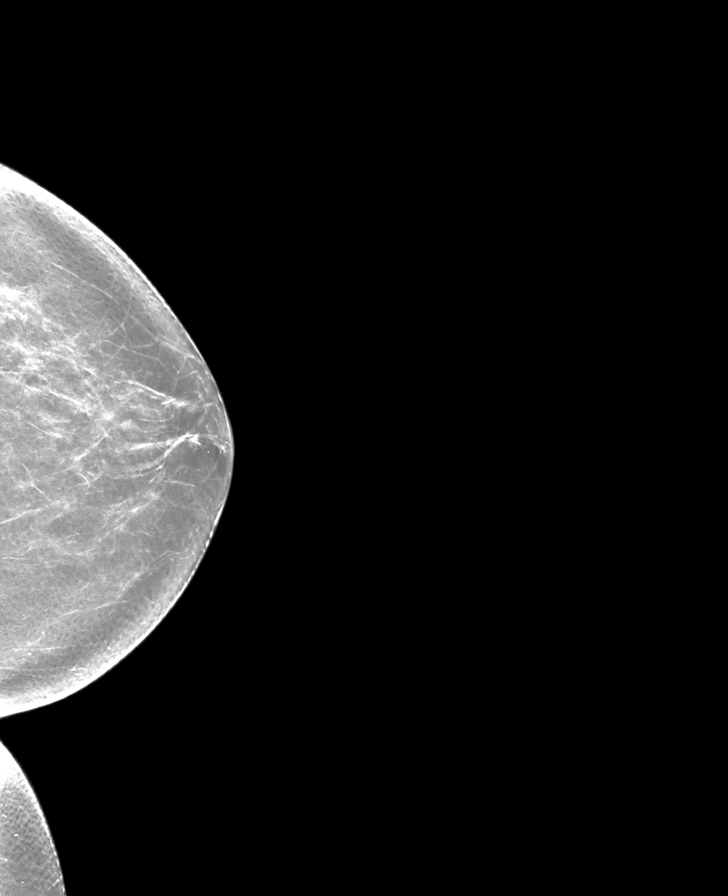

[R MLO tomo · tomo slice 37/72.0]
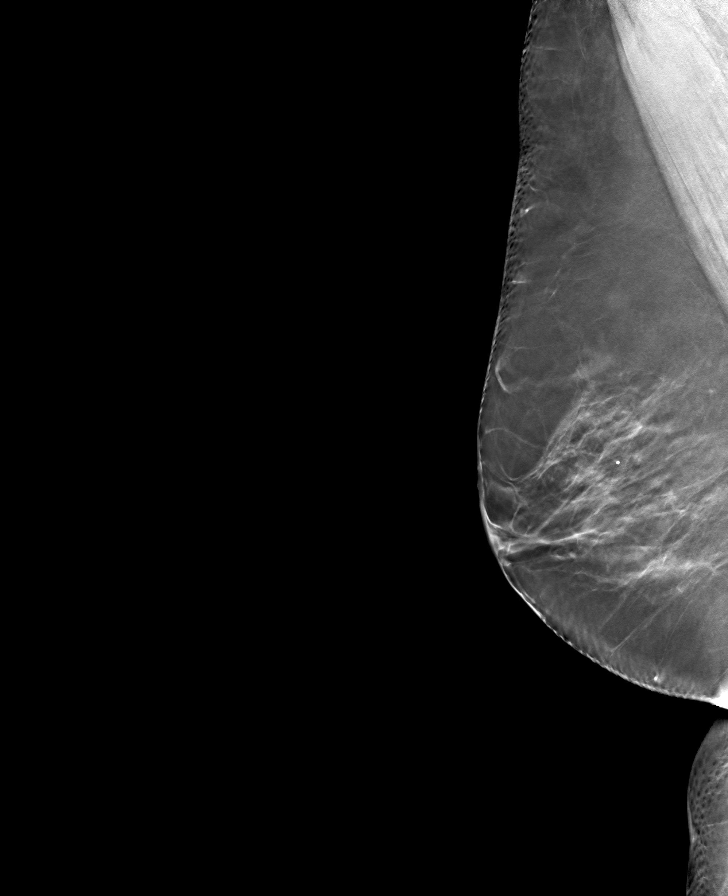

[R CC tomo · tomo slice 35/70.0]
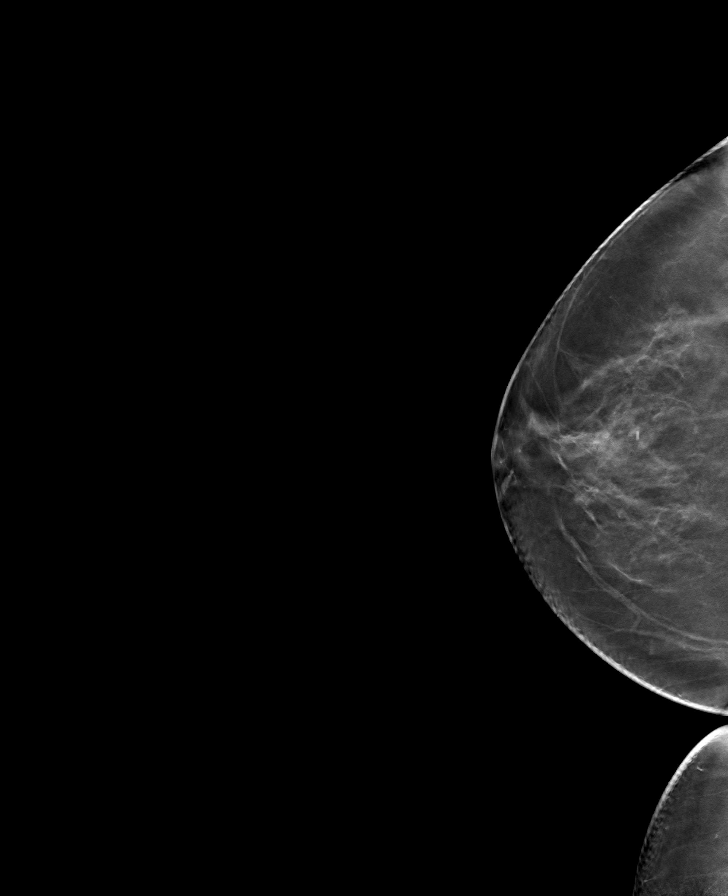

[L CC tomo · tomo slice 35/69.0]
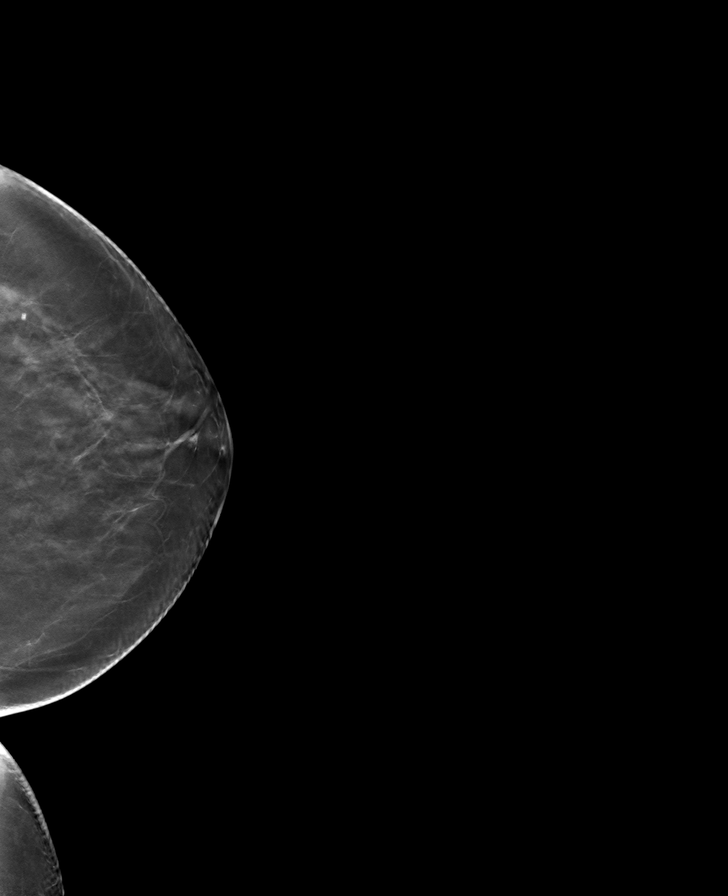

[L MLO tomo · tomo slice 38/75.0]
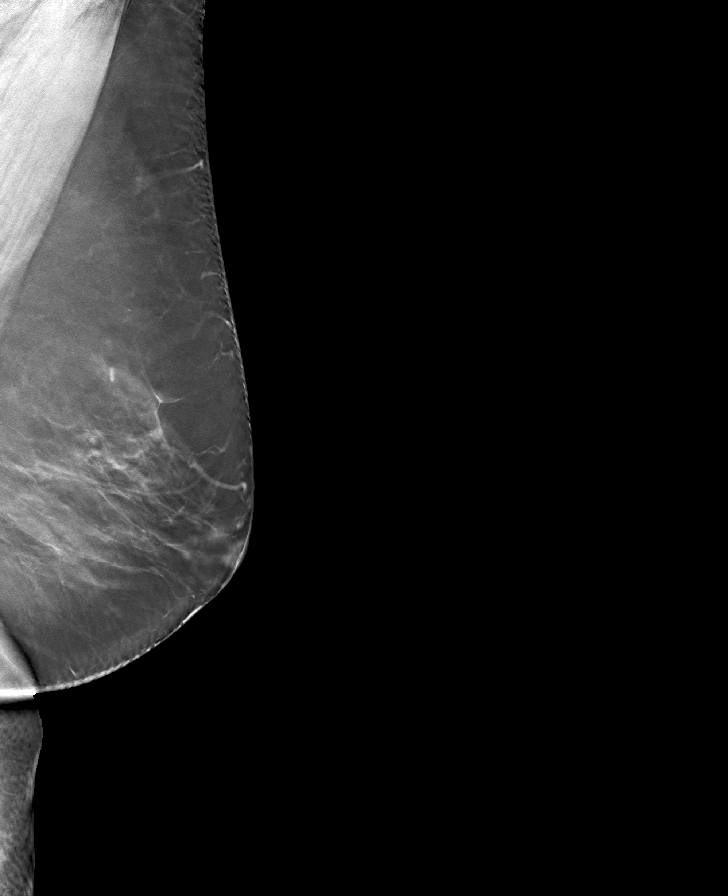

[8 of 24 positions shown; findings below may reference images not displayed]

ACR Breast Density Category b: There are scattered areas of
fibroglandular density.
FINDINGS: There are no findings suspicious for malignancy. Images were
processed with CAD.
IMPRESSION: No mammographic evidence of malignancy. A result letter of this
screening mammogram will be mailed directly to the patient.

RECOMMENDATION:
Screening mammogram in one year. (Code:[id number])

BI-RADS CATEGORY  1: Negative.

## 2018-10-24 ENCOUNTER — Encounter: Payer: Self-pay | Admitting: Internal Medicine

## 2018-10-24 ENCOUNTER — Ambulatory Visit (INDEPENDENT_AMBULATORY_CARE_PROVIDER_SITE_OTHER): Payer: Medicare HMO | Admitting: Internal Medicine

## 2018-10-24 ENCOUNTER — Other Ambulatory Visit: Payer: Self-pay

## 2018-10-24 VITALS — BP 132/70 | HR 87 | Ht 62.0 in | Wt 138.0 lb

## 2018-10-24 DIAGNOSIS — E785 Hyperlipidemia, unspecified: Secondary | ICD-10-CM | POA: Diagnosis not present

## 2018-10-24 DIAGNOSIS — Z0000 Encounter for general adult medical examination without abnormal findings: Secondary | ICD-10-CM

## 2018-10-24 DIAGNOSIS — N390 Urinary tract infection, site not specified: Secondary | ICD-10-CM

## 2018-10-24 DIAGNOSIS — Z23 Encounter for immunization: Secondary | ICD-10-CM

## 2018-10-24 DIAGNOSIS — I340 Nonrheumatic mitral (valve) insufficiency: Secondary | ICD-10-CM | POA: Diagnosis not present

## 2018-10-24 DIAGNOSIS — Z13820 Encounter for screening for osteoporosis: Secondary | ICD-10-CM | POA: Diagnosis not present

## 2018-10-24 DIAGNOSIS — Z1231 Encounter for screening mammogram for malignant neoplasm of breast: Secondary | ICD-10-CM

## 2018-10-24 DIAGNOSIS — I10 Essential (primary) hypertension: Secondary | ICD-10-CM | POA: Diagnosis not present

## 2018-10-24 MED ORDER — ZOSTER VAC RECOMB ADJUVANTED 50 MCG/0.5ML IM SUSR: 0.5000 mL | Freq: Once | INTRAMUSCULAR | 1 refills | Status: AC

## 2018-10-24 NOTE — Progress Notes (Signed)
Date:  10/24/2018   Name:  Rebecca Gutierrez   DOB:  1941-01-25   MRN:  [id number]   Chief Complaint: Annual Exam (Breast Exam. No pap- aged out. ) Rebecca Gutierrez is a 78 y.o. female who presents today for her Complete Annual Exam. She feels well. She reports exercising walking. She reports she is sleeping well.  Mammogram due 09/2018 DEXA ordered Immunizations are up to date.  Pt is interested in Shingrix.  Hypertension  This is a chronic problem. The problem is controlled. Pertinent negatives include no chest pain, headaches, palpitations or shortness of breath. Past treatments include diuretics, ACE inhibitors and beta blockers.  Hyperlipidemia  The problem is controlled. Pertinent negatives include no chest pain or shortness of breath. Current antihyperlipidemic treatment includes statins. The current treatment provides significant improvement of lipids.  Recurrent UTI - She is using estrogen cream three nights per week and taking trimethoprim prophylactically. She is also using cranberry powder. She does not currently have any uti concerns. Mitral insufficiency - followed by cardiology. No chest pain, palpitations, shortness of breath.   Lab Results  Component Value Date   CREATININE 0.75 03/16/2018   BUN 19 03/16/2018   NA 142 10/19/2017   K 3.9 10/19/2017   CL 100 10/19/2017   CO2 26 10/19/2017   Lab Results  Component Value Date   CHOL 194 10/19/2017   HDL 72 10/19/2017   LDLCALC 99 10/19/2017   TRIG 114 10/19/2017   CHOLHDL 2.7 10/19/2017     Review of Systems  Constitutional: Negative for chills, fatigue and fever.  HENT: Negative for congestion, hearing loss, tinnitus, trouble swallowing and voice change.   Eyes: Negative for visual disturbance.  Respiratory: Negative for cough, chest tightness, shortness of breath and wheezing.   Cardiovascular: Negative for chest pain, palpitations and leg swelling.  Gastrointestinal: Negative for abdominal pain,  constipation, diarrhea and vomiting.  Endocrine: Negative for polydipsia and polyuria.  Genitourinary: Negative for dysuria, frequency, genital sores, vaginal bleeding and vaginal discharge.  Musculoskeletal: Negative for arthralgias, gait problem and joint swelling.  Skin: Negative for color change and rash.  Allergic/Immunologic: Positive for environmental allergies.  Neurological: Negative for dizziness, tremors, light-headedness and headaches.  Hematological: Negative for adenopathy. Does not bruise/bleed easily.  Psychiatric/Behavioral: Negative for dysphoric mood and sleep disturbance. The patient is not nervous/anxious.     Patient Active Problem List   Diagnosis Date Noted  . Cystocele, midline 04/11/2018  . Moderate mitral insufficiency 11/10/2017  . 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 Recurrent UTI 03/10/2015  . OSTEOPENIA 06/28/2009  . Hyperlipidemia LDL goal <100 06/02/2007  . 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 Hearing loss 06/02/2007  . Essential hypertension, benign 06/02/2007  . Acute bilateral low back pain with bilateral sciatica 06/02/2007    Allergies  Allergen Reactions  . Ciprofloxacin     Tendinitis   . Levaquin [Levofloxacin] Other (See Comments)    insomnia  . [name] Diarrhea  . [name] Palpitations and Rash    Last reaction was in 1969    Past Surgical History:  Procedure Laterality Date  . ABDOMINAL HYSTERECTOMY  2014   partial  . ANTERIOR AND POSTERIOR REPAIR N/A 05/02/2013   Procedure: ANTERIOR (CYSTOCELE) AND POSTERIOR REPAIR (RECTOCELE);  Surgeon: [name], MD;  Location: [facility];  Service: Gynecology;  Laterality: N/A;  . CATARACT EXTRACTION, BILATERAL    . COLONOSCOPY  08/28/2011   Procedure: COLONOSCOPY;  Surgeon: [name], MD;  [facility];  Service: Endoscopy;  Laterality: N/A;  930  . EYE SURGERY Bilateral 02/13/2011   other 2013  . MOHS SURGERY  06/2016   Removed skin  cancer from Rt shin  . TUBAL LIGATION    . VAGINAL HYSTERECTOMY N/A 05/02/2013   Procedure: HYSTERECTOMY VAGINAL;  Surgeon: [name], MD;  Location: [facility];  Service: Gynecology;  Laterality: N/A;    Social History   Tobacco Use  . Smoking status: Never Smoker  . Smokeless tobacco: Never Used  . Tobacco comment: smoking cessation materials not required  Substance Use Topics  . Alcohol use: Yes    Comment: one drink per week  . Drug use: No     Medication list has been reviewed and updated.  Current Meds  Medication Sig  . aspirin EC 81 MG tablet Take 81 mg by mouth at bedtime.   . Cholecalciferol (VITAMIN D HIGH POTENCY PO) Take by mouth.  . estradiol (ESTRACE VAGINAL) 0.1 MG/GM vaginal cream Apply 0.5mg  (pea-sized amount)  just inside the vaginal introitus with a finger-tip on Monday, Wednesday and Friday nights.  . hydrochlorothiazide (HYDRODIURIL) 25 MG tablet TAKE 1 TABLET DAILY  . metoprolol tartrate (LOPRESSOR) 25 MG tablet TAKE ONE AND ONE-HALF TABLETS TWICE A DAY  . Probiotic Product (PROBIOTIC-10 PO) Take by mouth.  . ramipril (ALTACE) 10 MG capsule Take 1 capsule (10 mg total) by mouth daily.  . simvastatin (ZOCOR) 10 MG tablet TAKE 1 TABLET DAILY  . trimethoprim (TRIMPEX) 100 MG tablet Take 1 tablet by mouth daily.    PHQ 2/9 Scores 10/24/2018 10/10/2018 10/04/2017 09/29/2016  PHQ - 2 Score 0 0 0 0  PHQ- 9 Score - - 0 -    BP Readings from Last 3 Encounters:  10/24/18 132/70  10/10/18 126/62  08/10/18 (!) 162/80    Physical Exam Vitals signs and nursing note reviewed.  Constitutional:      General: She is not in acute distress.    Appearance: She is well-developed.  HENT:     Head: Normocephalic and atraumatic.     Right Ear: Tympanic membrane and ear canal normal.     Left Ear: Tympanic membrane and ear canal normal.     Nose:     Right Sinus: No maxillary sinus tenderness.     Left Sinus: No maxillary sinus tenderness.     Mouth/Throat:      Mouth: Mucous membranes are moist.     Pharynx: Uvula midline.  Eyes:     General: No scleral icterus.       Right eye: No discharge.        Left eye: No discharge.     Conjunctiva/sclera: Conjunctivae normal.  Neck:     Musculoskeletal: Normal range of motion. No erythema.     Thyroid: No thyromegaly.     Vascular: No carotid bruit.  Cardiovascular:     Rate and Rhythm: Normal rate and regular rhythm.     Pulses: Normal pulses.     Heart sounds: Murmur present. Systolic murmur present with a grade of 2/6. No gallop.   Pulmonary:     Effort: Pulmonary effort is normal. No respiratory distress.     Breath sounds: No wheezing.  Chest:     Breasts:        Right: No mass, nipple discharge, skin change or tenderness.        Left: No mass, nipple discharge, skin change or tenderness.  Abdominal:  General: Bowel sounds are normal.     Palpations: Abdomen is soft.     Tenderness: There is no abdominal tenderness.  Musculoskeletal: Normal range of motion.     Right lower leg: No edema.     Left lower leg: No edema.  Lymphadenopathy:     Cervical: No cervical adenopathy.  Skin:    General: Skin is warm and dry.     Findings: No rash.  Neurological:     Mental Status: She is alert and oriented to person, place, and time.     Cranial Nerves: No cranial nerve deficit.     Sensory: No sensory deficit.     Deep Tendon Reflexes: Reflexes are normal and symmetric.  Psychiatric:        Speech: Speech normal.        Behavior: Behavior normal.        Thought Content: Thought content normal.      Wt Readings from Last 3 Encounters:  10/24/18 138 lb (62.6 kg)  10/10/18 138 lb (62.6 kg)  08/10/18 142 lb (64.4 kg)    BP 132/70   Pulse 87   Ht 5\' 2"  (1.575 m)   Wt 138 lb (62.6 kg)   SpO2 97%   BMI 25.24 kg/m   Assessment and Plan: 1. Annual physical exam Normal exam Continue healthy diet and exercise  2. Encounter for screening mammogram for breast cancer Will have  scheduling call her  3. Encounter for screening for osteoporosis Will have scheduling call her since DEXA is ordered  4. Essential hypertension, benign controlled - CBC with Differential/Platelet - Comprehensive metabolic panel - TSH  5. Moderate mitral insufficiency Stable without symptoms- follow up with cardiology  6. Hyperlipidemia LDL goal <100 - Comprehensive metabolic panel - Lipid panel  7. Recurrent UTI Improved currently Continue regimen  8. Need for shingles vaccine - Zoster Vaccine Adjuvanted [facility]) injection; Inject 0.5 mLs into the muscle once for 1 dose.  Dispense: 0.5 mL; Refill: 1   Partially dictated using Editor, commissioning. Any errors are unintentional.  Halina Maidens, MD [facility]  10/24/2018

## 2018-10-25 LAB — LIPID PANEL
Chol/HDL Ratio: 2.6 ratio (ref 0.0–4.4)
Cholesterol, Total: 173 mg/dL (ref 100–199)
HDL: 66 mg/dL (ref >39)
LDL Calculated: 93 mg/dL (ref 0–99)
Triglycerides: 71 mg/dL (ref 0–149)
VLDL Cholesterol Cal: 14 mg/dL (ref 5–40)

## 2018-10-25 LAB — COMPREHENSIVE METABOLIC PANEL
ALT: 20 IU/L (ref 0–32)
AST: 18 IU/L (ref 0–40)
Albumin/Globulin Ratio: 1.7 (ref 1.2–2.2)
Albumin: 4.4 g/dL (ref 3.7–4.7)
Alkaline Phosphatase: 72 IU/L (ref 39–117)
BUN/Creatinine Ratio: 23 (ref 12–28)
BUN: 22 mg/dL (ref 8–27)
Bilirubin Total: 0.9 mg/dL (ref 0.0–1.2)
CO2: 23 mmol/L (ref 20–29)
Calcium: 9.9 mg/dL (ref 8.7–10.3)
Chloride: 101 mmol/L (ref 96–106)
Creatinine, Ser: 0.95 mg/dL (ref 0.57–1.00)
GFR calc Af Amer: 67 mL/min/{1.73_m2} (ref >59)
GFR calc non Af Amer: 58 mL/min/{1.73_m2} — ABNORMAL LOW (ref >59)
Globulin, Total: 2.6 g/dL (ref 1.5–4.5)
Glucose: 106 mg/dL — ABNORMAL HIGH (ref 65–99)
Potassium: 3.7 mmol/L (ref 3.5–5.2)
Sodium: 138 mmol/L (ref 134–144)
Total Protein: 7 g/dL (ref 6.0–8.5)

## 2018-10-25 LAB — CBC WITH DIFFERENTIAL/PLATELET
Basophils Absolute: 0 10*3/uL (ref 0.0–0.2)
Basos: 1 %
EOS (ABSOLUTE): 0.1 10*3/uL (ref 0.0–0.4)
Eos: 3 %
Hematocrit: 41 % (ref 34.0–46.6)
Hemoglobin: 14.3 g/dL (ref 11.1–15.9)
Immature Grans (Abs): 0 10*3/uL (ref 0.0–0.1)
Immature Granulocytes: 0 %
Lymphocytes Absolute: 1 10*3/uL (ref 0.7–3.1)
Lymphs: 25 %
MCH: 31.4 pg (ref 26.6–33.0)
MCHC: 34.9 g/dL (ref 31.5–35.7)
MCV: 90 fL (ref 79–97)
Monocytes Absolute: 0.4 10*3/uL (ref 0.1–0.9)
Monocytes: 10 %
Neutrophils Absolute: 2.5 10*3/uL (ref 1.4–7.0)
Neutrophils: 61 %
Platelets: 209 10*3/uL (ref 150–450)
RBC: 4.56 x10E6/uL (ref 3.77–5.28)
RDW: 13.4 % (ref 11.7–15.4)
WBC: 4.1 10*3/uL (ref 3.4–10.8)

## 2018-10-25 LAB — TSH: TSH: 3.27 u[IU]/mL (ref 0.450–4.500)

## 2018-11-08 DIAGNOSIS — I340 Nonrheumatic mitral (valve) insufficiency: Secondary | ICD-10-CM | POA: Diagnosis not present

## 2018-11-08 DIAGNOSIS — I10 Essential (primary) hypertension: Secondary | ICD-10-CM | POA: Diagnosis not present

## 2018-11-08 DIAGNOSIS — I493 Ventricular premature depolarization: Secondary | ICD-10-CM | POA: Diagnosis not present

## 2018-11-08 DIAGNOSIS — E782 Mixed hyperlipidemia: Secondary | ICD-10-CM | POA: Diagnosis not present

## 2018-12-07 ENCOUNTER — Ambulatory Visit: Payer: Medicare HMO

## 2018-12-07 ENCOUNTER — Other Ambulatory Visit: Payer: Medicare HMO

## 2018-12-13 ENCOUNTER — Other Ambulatory Visit: Payer: Self-pay

## 2018-12-13 ENCOUNTER — Ambulatory Visit
Admission: RE | Admit: 2018-12-13 | Discharge: 2018-12-13 | Disposition: A | Discharge status: Home / Self Care | Payer: Medicare HMO | Source: Ambulatory Visit | Attending: Internal Medicine | Admitting: Internal Medicine

## 2018-12-13 DIAGNOSIS — Z1231 Encounter for screening mammogram for malignant neoplasm of breast: Secondary | ICD-10-CM | POA: Insufficient documentation

## 2018-12-13 DIAGNOSIS — M8580 Other specified disorders of bone density and structure, unspecified site: Secondary | ICD-10-CM

## 2018-12-13 DIAGNOSIS — Z780 Asymptomatic menopausal state: Secondary | ICD-10-CM | POA: Diagnosis not present

## 2018-12-13 DIAGNOSIS — M8589 Other specified disorders of bone density and structure, multiple sites: Secondary | ICD-10-CM | POA: Diagnosis not present

## 2018-12-13 DIAGNOSIS — Z13820 Encounter for screening for osteoporosis: Secondary | ICD-10-CM | POA: Diagnosis not present

## 2018-12-29 ENCOUNTER — Telehealth: Payer: Self-pay | Admitting: Urology

## 2018-12-29 NOTE — Telephone Encounter (Signed)
Patient coming in tomorrow on nurse schedule for a UA drop off

## 2018-12-29 NOTE — Telephone Encounter (Signed)
Pt is having painful and frequent urination, she denies any fever or nausea. Please advise.

## 2018-12-30 ENCOUNTER — Other Ambulatory Visit: Payer: Self-pay

## 2018-12-30 ENCOUNTER — Ambulatory Visit (INDEPENDENT_AMBULATORY_CARE_PROVIDER_SITE_OTHER): Payer: Medicare HMO

## 2018-12-30 DIAGNOSIS — N390 Urinary tract infection, site not specified: Secondary | ICD-10-CM

## 2018-12-30 LAB — MICROSCOPIC EXAMINATION
Bacteria, UA: NONE SEEN
Epithelial Cells (non renal): NONE SEEN /hpf (ref 0–10)
RBC, Urine: NONE SEEN /hpf (ref 0–2)

## 2018-12-30 LAB — URINALYSIS, COMPLETE
Bilirubin, UA: NEGATIVE
Glucose, UA: NEGATIVE
Ketones, UA: NEGATIVE
Leukocytes,UA: NEGATIVE
Nitrite, UA: NEGATIVE
Protein,UA: NEGATIVE
RBC, UA: NEGATIVE
Specific Gravity, UA: <1.005 — ABNORMAL LOW (ref 1.005–1.030)
Urobilinogen, Ur: 0.2 mg/dL (ref 0.2–1.0)
pH, UA: 6 (ref 5.0–7.5)

## 2018-12-30 NOTE — Progress Notes (Signed)
In and Out Catheterization  Patient is present today for a I & O catheterization due to rUTIs. Patient was cleaned and prepped in a sterile fashion with betadine and Lidocaine 2% jelly was instilled into the urethra.  A 14FR cath was inserted no complications were noted , 64ml of urine return was noted, urine was pale yellow in color. A clean urine sample was collected for ua and cx. Bladder was drained  And catheter was removed with out difficulty.    Preformed by: Gordy Clement, CMA (AAMA)  Follow up/ Additional notes: F/U as scheduled     Rebecca Gutierrez is a 78 y.o. female who complains of dysuria x 2 days, without flank pain, fever, chills, or abnormal vaginal discharge or bleeding. Vital signs are normal. Patient is on suppressive antibiotic and states that she is using her Estrace cream regularly.    Treatment per orders, UA is clear with no signs of infection. Will await culture results. Push fluids, may use Pyridium OTC prn. Call or return to clinic prn if these symptoms worsen or fail to improve as anticipated.

## 2019-01-02 LAB — CULTURE, URINE COMPREHENSIVE

## 2019-01-31 ENCOUNTER — Encounter: Payer: Self-pay | Admitting: Urology

## 2019-01-31 ENCOUNTER — Other Ambulatory Visit: Payer: Self-pay

## 2019-01-31 ENCOUNTER — Ambulatory Visit (INDEPENDENT_AMBULATORY_CARE_PROVIDER_SITE_OTHER): Payer: Medicare HMO | Admitting: Urology

## 2019-01-31 VITALS — BP 138/76 | HR 69 | Ht 62.0 in | Wt 140.0 lb

## 2019-01-31 DIAGNOSIS — N8111 Cystocele, midline: Secondary | ICD-10-CM | POA: Diagnosis not present

## 2019-01-31 DIAGNOSIS — N281 Cyst of kidney, acquired: Secondary | ICD-10-CM | POA: Diagnosis not present

## 2019-01-31 DIAGNOSIS — Z87440 Personal history of urinary (tract) infections: Secondary | ICD-10-CM

## 2019-01-31 DIAGNOSIS — N952 Postmenopausal atrophic vaginitis: Secondary | ICD-10-CM | POA: Diagnosis not present

## 2019-01-31 LAB — BLADDER SCAN AMB NON-IMAGING: Scan Result: 162

## 2019-01-31 MED ORDER — TRIMETHOPRIM 100 MG PO TABS: 100.0000 mg | ORAL_TABLET | Freq: Every day | ORAL | 3 refills | Status: DC

## 2019-01-31 NOTE — Progress Notes (Signed)
01/31/2019 11:17 AM   Kynnedi Zweig Victorino [birth year] [id number]  Referring provider: [name], MD [address] [place],  [place] [address]  Chief Complaint  Patient presents with  . Follow-up    HPI: Mrs. Alling is a 78 year old female with a history of rUTI's, vaginal atrophy, complex renal cyst and a cystocele who presents today for follow up.  History of rUTI's Risk factors: age, vaginal atrophy, limited fluid intake and cystocele with moderate residuals.  She had seen an infectious disease specialist who confirmed my recommendation of concentrated cranberry juice>sweetened cranberry juice, crede maneuver to ensure complete bladder emptying and implementation of probiotics.  She was placed on trimethoprim suppression in August.  She is asymptomatic at this visit.    Vaginal atrophy Denies any vaginal itching, vaginal discharge or vaginal bleeding.  Mammogram in 11/2018 was negative.   Using the vaginal estrogen cream three nights weekly   Complex renal cyst RUS in 09/2018 noted no evidence of hydronephrosis.  Redemonstration of 1 cm cyst of the lateral cortex of the left kidney.  Cystocele Managed with pessary     PMH: Past Medical History:  Diagnosis Date  . Abnormal ECG 10/28/2017  . Anxiety   . Bradycardia 11/10/2017  . Cancer ([place])    skin ca  . Cystocele 09/13/2013   small recurrent, less than before 09/13/13 JVF   . Gilbert's syndrome   . Hearing loss 1975  . History of recurrent urinary tract infection 03/24/2016  . Hyperlipidemia 2000  . Hypertension 2000    Surgical History: Past Surgical History:  Procedure Laterality Date  . ABDOMINAL HYSTERECTOMY  2014   partial  . ANTERIOR AND POSTERIOR REPAIR N/A 05/02/2013   Procedure: ANTERIOR (CYSTOCELE) AND POSTERIOR REPAIR (RECTOCELE);  Surgeon: [name], MD;  Location: [facility];  Service: Gynecology;  Laterality: N/A;  . CATARACT EXTRACTION, BILATERAL    . COLONOSCOPY  08/28/2011   Procedure: COLONOSCOPY;  Surgeon: [name], MD;  Location: [facility];  Service: Endoscopy;  Laterality: N/A;  930  . EYE SURGERY Bilateral 02/13/2011   other 2013  . 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 TUBAL LIGATION    . VAGINAL HYSTERECTOMY N/A 05/02/2013   Procedure: HYSTERECTOMY VAGINAL;  Surgeon: [name], MD;  Location: [facility];  Service: Gynecology;  Laterality: N/A;    Home Medications:  Allergies as of 01/31/2019      Reactions   Ciprofloxacin    Tendinitis    Levaquin [levofloxacin] Other (See Comments)   insomnia   [name] Diarrhea   [name] Palpitations, Rash   Last reaction was in 1969      Medication List       Accurate as of January 31, 2019 11:59 PM. If you have any questions, ask your nurse or doctor.        aspirin EC 81 MG tablet Take 81 mg by mouth at bedtime.   estradiol 0.1 MG/GM vaginal cream Commonly known as: ESTRACE VAGINAL Apply 0.5mg  (pea-sized amount)  just inside the vaginal introitus with a finger-tip on Monday, Wednesday and Friday nights.   hydrochlorothiazide 25 MG tablet Commonly known as: HYDRODIURIL TAKE 1 TABLET DAILY   metoprolol tartrate 25 MG tablet Commonly known as: LOPRESSOR TAKE ONE AND ONE-HALF TABLETS TWICE A DAY   PROBIOTIC-10 PO Take by mouth.   ramipril 10 MG capsule Commonly known as: ALTACE Take 1 capsule (10 mg  total) by mouth daily.   simvastatin 10 MG tablet Commonly known as: ZOCOR TAKE 1 TABLET DAILY   trimethoprim 100 MG tablet Commonly known as: TRIMPEX Take 1 tablet (100 mg total) by mouth daily.   VITAMIN D HIGH POTENCY PO Take by mouth.       Allergies:  Allergies  Allergen Reactions  . Ciprofloxacin     Tendinitis   . Levaquin [Levofloxacin] Other (See Comments)    insomnia  . [name] Diarrhea  . [name] Palpitations and Rash    Last reaction was in 1969    Family History:  Family History  Problem Relation Age of Onset  . Hypertension Mother   . Cancer Mother        stomach  . Healthy Father   . Healthy Brother   . Colon cancer Neg Hx   . Prostate cancer Neg Hx   . Kidney cancer Neg Hx   . Bladder Cancer Neg Hx   . Breast cancer Neg Hx     Social History:  reports that she has never smoked. She has never used smokeless tobacco. She reports current alcohol use. She reports that she does not use drugs.  ROS: UROLOGY Frequent Urination?: No Hard to postpone urination?: No Burning/pain with urination?: No Get up at night to urinate?: No Leakage of urine?: No Urine stream starts and stops?: No Trouble starting stream?: No Do you have to strain to urinate?: No Blood in urine?: No Urinary tract infection?: No Sexually transmitted disease?: No Injury to kidneys or bladder?: No Painful intercourse?: No Weak stream?: No Currently pregnant?: No Vaginal bleeding?: No Last menstrual period?: n  Gastrointestinal Nausea?: No Vomiting?: No Indigestion/heartburn?: No Diarrhea?: No Constipation?: No  Constitutional Fever: No Night sweats?: No Weight loss?: No Fatigue?: No  Skin Skin rash/lesions?: No Itching?: No  Eyes Blurred vision?: No Double vision?: No  Ears/Nose/Throat Sore throat?: No Sinus problems?: No  Hematologic/Lymphatic Swollen glands?: No Easy bruising?: No  Cardiovascular Leg swelling?: No Chest pain?: No  Respiratory Cough?: No Shortness of breath?: No  Endocrine Excessive thirst?: No  Musculoskeletal Back pain?: No Joint pain?: No  Neurological Headaches?: No Dizziness?: No  Psychologic Depression?: No Anxiety?: No  Physical Exam: BP 138/76 (BP Location: Left Arm, Patient Position: Sitting, Cuff Size: Normal)   Pulse 69   Ht 5\' 2"  (1.575 m)   Wt 140 lb (63.5 kg)   BMI 25.61 kg/m   Constitutional:  Well nourished. Alert and oriented, No acute distress. HEENT: [place] AT, moist mucus membranes.   Trachea midline, no masses. Cardiovascular: No clubbing, cyanosis, or edema. Respiratory: Normal respiratory effort, no increased work of breathing. Neurologic: Grossly intact, no focal deficits, moving all 4 extremities. Psychiatric: Normal mood and affect.  Laboratory Data: Lab Results  Component Value Date   WBC 4.1 10/24/2018   HGB 14.3 10/24/2018   HCT 41.0 10/24/2018   MCV 90 10/24/2018   PLT 209 10/24/2018    Lab Results  Component Value Date   CREATININE 0.95 10/24/2018    No results found for: PSA  No results found for: TESTOSTERONE  Lab Results  Component Value Date   HGBA1C 5.6 07/23/2011    Lab Results  Component Value Date   TSH 3.270 10/24/2018       Component Value Date/Time   CHOL 173 10/24/2018 1009   HDL 66 10/24/2018 1009   CHOLHDL 2.6 10/24/2018 1009   CHOLHDL 2.1 09/25/2015 0822   VLDL 13 09/25/2015 6629  LDLCALC 93 10/24/2018 1009    Lab Results  Component Value Date   AST 18 10/24/2018   Lab Results  Component Value Date   ALT 20 10/24/2018   No components found for: ALKALINEPHOPHATASE No components found for: BILIRUBINTOTAL  No results found for: ESTRADIOL  Urinalysis    Component Value Date/Time   COLORURINE YELLOW 05/29/2016 0954   APPEARANCEUR Clear 12/30/2018 1415   LABSPEC 1.015 05/29/2016 0954   PHURINE 6.0 05/29/2016 0954   GLUCOSEU Negative 12/30/2018 1415   HGBUR NEGATIVE 05/29/2016 0954   BILIRUBINUR Negative 12/30/2018 [place] 05/29/2016 0954   PROTEINUR Negative 12/30/2018 1415   PROTEINUR NEGATIVE 05/29/2016 0954   UROBILINOGEN 0.2 01/13/2018 1147   UROBILINOGEN 0.2 05/02/2013 0930   NITRITE Negative 12/30/2018 1415   NITRITE NEGATIVE 05/29/2016 0954   LEUKOCYTESUR Negative 12/30/2018 1415    I have reviewed the labs.   Pertinent Imaging: Results for JASSMINE, VANDRUFF (MRN [id number]) as of 02/06/2019 11:46  Ref. Range 01/31/2019 15:27  Scan Result Unknown 162    Assessment &  Plan:   1. rUTI's Asymptomatic at this visit Continue trimethoprim suppression Report any breakthrough in infections RTC in April for recheck  2. Vaginal atrophy Continue the vaginal estrogen cream three nights weekly  3. Complex renal cyst RUS in April 2021  4. Cystocele Continue pessary   Return in about 8 months (around 10/01/2019) for RUS report .  These notes generated with voice recognition software. I apologize for typographical errors.  Zara Council, PA-C  [facility] [address]  [place], [place] [address] [phone]

## 2019-02-08 DIAGNOSIS — L570 Actinic keratosis: Secondary | ICD-10-CM | POA: Diagnosis not present

## 2019-02-08 DIAGNOSIS — L821 Other seborrheic keratosis: Secondary | ICD-10-CM | POA: Diagnosis not present

## 2019-04-18 ENCOUNTER — Ambulatory Visit (INDEPENDENT_AMBULATORY_CARE_PROVIDER_SITE_OTHER): Payer: Medicare HMO | Admitting: Internal Medicine

## 2019-04-18 ENCOUNTER — Encounter: Payer: Self-pay | Admitting: Internal Medicine

## 2019-04-18 ENCOUNTER — Other Ambulatory Visit: Payer: Self-pay

## 2019-04-18 VITALS — BP 122/80 | HR 67 | Temp 98.1°F | Ht 62.0 in | Wt 141.0 lb

## 2019-04-18 DIAGNOSIS — N390 Urinary tract infection, site not specified: Secondary | ICD-10-CM | POA: Diagnosis not present

## 2019-04-18 DIAGNOSIS — E785 Hyperlipidemia, unspecified: Secondary | ICD-10-CM | POA: Diagnosis not present

## 2019-04-18 DIAGNOSIS — R002 Palpitations: Secondary | ICD-10-CM

## 2019-04-18 DIAGNOSIS — Z23 Encounter for immunization: Secondary | ICD-10-CM | POA: Diagnosis not present

## 2019-04-18 DIAGNOSIS — I10 Essential (primary) hypertension: Secondary | ICD-10-CM

## 2019-04-18 MED ORDER — CEPHALEXIN 500 MG PO CAPS: 500.0000 mg | ORAL_CAPSULE | Freq: Two times a day (BID) | ORAL | 0 refills | Status: DC

## 2019-04-18 MED ORDER — RAMIPRIL 10 MG PO CAPS: 10.0000 mg | ORAL_CAPSULE | Freq: Every day | ORAL | 3 refills | Status: DC

## 2019-04-18 MED ORDER — METOPROLOL TARTRATE 25 MG PO TABS: ORAL_TABLET | ORAL | 3 refills | Status: DC

## 2019-04-18 MED ORDER — SIMVASTATIN 10 MG PO TABS: 10.0000 mg | ORAL_TABLET | Freq: Every day | ORAL | 3 refills | Status: DC

## 2019-04-18 NOTE — Progress Notes (Signed)
Date:  04/18/2019   Name:  Rebecca Gutierrez   DOB:  [birth year]   MRN:  [id number]   Chief Complaint: Hypertension (6 month follow up. RF on ramipril and metoprolol. High dose flu shot. ), Hyperlipidemia (Refill on simvastatin.), and Urinary Tract Infection (X 1.5 weeks. Having sxs of on and off dysuria, mucous in urine, and urgency to go to the bathroom every 2 hours. )  Hypertension The problem is unchanged. The problem is controlled. Associated symptoms include palpitations. Pertinent negatives include no chest pain, headaches or shortness of breath. Past treatments include beta blockers, diuretics and ACE inhibitors. The current treatment provides significant improvement. There are no compliance problems.  There is no history of kidney disease or CAD/MI.  Hyperlipidemia The problem is controlled. Pertinent negatives include no chest pain or shortness of breath. Current antihyperlipidemic treatment includes statins.  Dysuria  This is a new problem. The current episode started in the past 7 days. The problem occurs every urination. The problem has been gradually improving. The quality of the pain is described as burning. The pain is mild. There has been no fever. Associated symptoms include frequency. Pertinent negatives include no chills, hematuria, nausea or urgency. Treatments tried: on prophy Trimethoprim. Her past medical history is significant for recurrent UTIs.    Review of Systems  Constitutional: Negative for chills, fatigue and fever.  Respiratory: Negative for cough, chest tightness, shortness of breath and wheezing.   Cardiovascular: Positive for palpitations. Negative for chest pain and leg swelling.  Gastrointestinal: Negative for abdominal pain, constipation, diarrhea and nausea.  Endocrine: Negative for polydipsia and polyuria.  Genitourinary: Positive for dysuria and frequency. Negative for hematuria, pelvic pain and urgency.  Neurological: Negative for dizziness,  light-headedness and headaches.  Psychiatric/Behavioral: Negative for sleep disturbance.    Patient Active Problem List   Diagnosis Date Noted  . Cystocele, midline 04/11/2018  . Moderate mitral insufficiency 11/10/2017  . 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 Recurrent UTI 03/10/2015  . OSTEOPENIA 06/28/2009  . Hyperlipidemia LDL goal <100 06/02/2007  . 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 Hearing loss 06/02/2007  . Essential hypertension, benign 06/02/2007  . Acute bilateral low back pain with bilateral sciatica 06/02/2007    Allergies  Allergen Reactions  . Ciprofloxacin     Tendinitis   . Levaquin [Levofloxacin] Other (See Comments)    insomnia  . [name] Diarrhea  . [name] Palpitations and Rash    Last reaction was in 1969    Past Surgical History:  Procedure Laterality Date  . ABDOMINAL HYSTERECTOMY  2014   partial  . ANTERIOR AND POSTERIOR REPAIR N/A 05/02/2013   Procedure: ANTERIOR (CYSTOCELE) AND POSTERIOR REPAIR (RECTOCELE);  Surgeon: [name], MD;  Location: [facility];  Service: Gynecology;  Laterality: N/A;  . CATARACT EXTRACTION, BILATERAL    . COLONOSCOPY  08/28/2011   Procedure: COLONOSCOPY;  Surgeon: [name], MD;  [facility]: [facility];  Service: Endoscopy;  Laterality: N/A;  930  . EYE SURGERY Bilateral 02/13/2011   other 2013  . 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 TUBAL LIGATION    . VAGINAL HYSTERECTOMY N/A 05/02/2013   Procedure: HYSTERECTOMY VAGINAL;  Surgeon: [name], MD;  Location: [facility];  Service: Gynecology;  Laterality: N/A;    Social History   Tobacco Use  . Smoking status: Never Smoker  . Smokeless tobacco: Never Used  . Tobacco comment: smoking cessation materials not required  Substance Use Topics  . Alcohol use: Yes    Comment: one drink per week  . Drug use: No     Medication list has been reviewed and updated.  Current Meds  Medication Sig  .  aspirin EC 81 MG tablet Take 81 mg by mouth at bedtime.   . Cholecalciferol (VITAMIN D HIGH POTENCY PO) Take by mouth.  . estradiol (ESTRACE VAGINAL) 0.1 MG/GM vaginal cream Apply 0.5mg  (pea-sized amount)  just inside the vaginal introitus with a finger-tip on Monday, Wednesday and Friday nights.  . hydrochlorothiazide (HYDRODIURIL) 25 MG tablet TAKE 1 TABLET DAILY  . metoprolol tartrate (LOPRESSOR) 25 MG tablet TAKE ONE AND ONE-HALF TABLETS TWICE A DAY  . ramipril (ALTACE) 10 MG capsule Take 1 capsule (10 mg total) by mouth daily.  . simvastatin (ZOCOR) 10 MG tablet TAKE 1 TABLET DAILY  . trimethoprim (TRIMPEX) 100 MG tablet Take 1 tablet (100 mg total) by mouth daily.    PHQ 2/9 Scores 04/18/2019 10/24/2018 10/10/2018 10/04/2017  PHQ - 2 Score 0 0 0 0  PHQ- 9 Score - - - 0    BP Readings from Last 3 Encounters:  04/18/19 122/80  01/31/19 138/76  10/24/18 132/70    Physical Exam Vitals signs and nursing note reviewed.  Constitutional:      Appearance: Normal appearance. She is well-developed.  Neck:     Musculoskeletal: Normal range of motion.  Cardiovascular:     Rate and Rhythm: Normal rate and regular rhythm.     Pulses: Normal pulses.     Heart sounds: Normal heart sounds. No murmur.  Pulmonary:     Effort: Pulmonary effort is normal. No respiratory distress.     Breath sounds: Normal breath sounds.  Abdominal:     General: Bowel sounds are normal.     Palpations: Abdomen is soft.     Tenderness: There is abdominal tenderness in the suprapubic area. There is no guarding or rebound.  Musculoskeletal:     Right lower leg: No edema.     Left lower leg: No edema.  Lymphadenopathy:     Cervical: No cervical adenopathy.  Skin:    General: Skin is warm.     Capillary Refill: Capillary refill takes less than 2 seconds.  Neurological:     General: No focal deficit present.     Mental Status: She is alert.     Wt Readings from Last 3 Encounters:  04/18/19 141 lb (64 kg)   01/31/19 140 lb (63.5 kg)  10/24/18 138 lb (62.6 kg)    BP 122/80   Pulse 67   Temp 98.1 F (36.7 C) (Oral)   Ht 5\' 2"  (1.575 m)   Wt 141 lb (64 kg)   SpO2 99%   BMI 25.79 kg/m   Assessment and Plan: 1. Essential hypertension, benign Clinically stable exam with well controlled BP.   Tolerating medications, ramipril 10 mg, hctz 25 mg and metoprolol37.5 mg bid, without side effects at this time. Pt to continue current regimen and low sodium diet; benefits of regular exercise as able discussed. - metoprolol tartrate (LOPRESSOR) 25 MG tablet; TAKE ONE AND ONE-HALF TABLETS TWICE A DAY  Dispense: 270 tablet; Refill: 3  2. Palpitations Well controlled on beta blocker per patient - metoprolol tartrate (LOPRESSOR) 25 MG tablet; TAKE ONE AND ONE-HALF TABLETS TWICE A DAY  Dispense: 270 tablet; Refill: 3  3. Hyperlipidemia LDL goal <100 Tolerating statin medication without side effects at this time Continue same therapy without  change at this time. - simvastatin (ZOCOR) 10 MG tablet; Take 1 tablet (10 mg total) by mouth daily.  Dispense: 90 tablet; Refill: 3  4. Recurrent UTI Breakthrough sx on daily trimethoprim Will treat with Keflex bid x 7 day QNS to perform urine culture so if sx continue, recommend follow up with Urology. - cephALEXin (KEFLEX) 500 MG capsule; Take 1 capsule (500 mg total) by mouth 2 (two) times daily for 7 days.  Dispense: 14 capsule; Refill: 0   Partially dictated using Editor, commissioning. Any errors are unintentional.  Halina Maidens, MD [facility]  04/18/2019

## 2019-04-19 DIAGNOSIS — Z881 Allergy status to other antibiotic agents status: Secondary | ICD-10-CM | POA: Diagnosis not present

## 2019-04-19 DIAGNOSIS — Z85828 Personal history of other malignant neoplasm of skin: Secondary | ICD-10-CM | POA: Diagnosis not present

## 2019-04-19 DIAGNOSIS — Z87440 Personal history of urinary (tract) infections: Secondary | ICD-10-CM | POA: Diagnosis not present

## 2019-04-19 DIAGNOSIS — Z79890 Hormone replacement therapy (postmenopausal): Secondary | ICD-10-CM | POA: Diagnosis not present

## 2019-04-19 DIAGNOSIS — Z809 Family history of malignant neoplasm, unspecified: Secondary | ICD-10-CM | POA: Diagnosis not present

## 2019-04-19 DIAGNOSIS — E785 Hyperlipidemia, unspecified: Secondary | ICD-10-CM | POA: Diagnosis not present

## 2019-04-19 DIAGNOSIS — I10 Essential (primary) hypertension: Secondary | ICD-10-CM | POA: Diagnosis not present

## 2019-04-19 DIAGNOSIS — K219 Gastro-esophageal reflux disease without esophagitis: Secondary | ICD-10-CM | POA: Diagnosis not present

## 2019-04-19 DIAGNOSIS — Z8249 Family history of ischemic heart disease and other diseases of the circulatory system: Secondary | ICD-10-CM | POA: Diagnosis not present

## 2019-04-19 DIAGNOSIS — Z7982 Long term (current) use of aspirin: Secondary | ICD-10-CM | POA: Diagnosis not present

## 2019-05-01 ENCOUNTER — Encounter: Payer: Self-pay | Admitting: Urology

## 2019-05-01 ENCOUNTER — Other Ambulatory Visit: Payer: Self-pay

## 2019-05-01 ENCOUNTER — Other Ambulatory Visit
Admission: RE | Admit: 2019-05-01 | Discharge: 2019-05-01 | Disposition: A | Discharge status: Home / Self Care | Payer: Medicare HMO | Source: Ambulatory Visit | Attending: Urology | Admitting: Urology

## 2019-05-01 ENCOUNTER — Ambulatory Visit (INDEPENDENT_AMBULATORY_CARE_PROVIDER_SITE_OTHER): Payer: Medicare HMO | Admitting: Urology

## 2019-05-01 ENCOUNTER — Ambulatory Visit
Admission: RE | Admit: 2019-05-01 | Discharge: 2019-05-01 | Disposition: A | Discharge status: Home / Self Care | Payer: Medicare HMO | Source: Ambulatory Visit | Attending: Urology | Admitting: Urology

## 2019-05-01 VITALS — BP 127/75 | HR 63 | Ht 62.0 in | Wt 140.0 lb

## 2019-05-01 DIAGNOSIS — Z87440 Personal history of urinary (tract) infections: Secondary | ICD-10-CM | POA: Diagnosis not present

## 2019-05-01 DIAGNOSIS — R109 Unspecified abdominal pain: Secondary | ICD-10-CM

## 2019-05-01 DIAGNOSIS — N281 Cyst of kidney, acquired: Secondary | ICD-10-CM | POA: Diagnosis not present

## 2019-05-01 DIAGNOSIS — K6389 Other specified diseases of intestine: Secondary | ICD-10-CM | POA: Diagnosis not present

## 2019-05-01 DIAGNOSIS — N952 Postmenopausal atrophic vaginitis: Secondary | ICD-10-CM

## 2019-05-01 LAB — URINALYSIS, COMPLETE (UACMP) WITH MICROSCOPIC
Bacteria, UA: NONE SEEN
Bilirubin Urine: NEGATIVE
Glucose, UA: NEGATIVE mg/dL
Hgb urine dipstick: NEGATIVE
Ketones, ur: NEGATIVE mg/dL
Leukocytes,Ua: NEGATIVE
Nitrite: NEGATIVE
Protein, ur: NEGATIVE mg/dL
Specific Gravity, Urine: 1.015 (ref 1.005–1.030)
pH: 6.5 (ref 5.0–8.0)

## 2019-05-01 NOTE — Progress Notes (Signed)
05/01/2019 12:49 PM   Rebecca Gutierrez 21-Jan-1941 [id number]  Referring provider: [name], MD [address] [place],  [place] [address]  Chief Complaint  Patient presents with  . Recurrent UTI    HPI: [name] is a 78 year old female with a history of rUTI's, vaginal atrophy, complex renal cyst and a cystocele who presents today for symptoms of back pain.    History of rUTI's Risk factors: age, vaginal atrophy, limited fluid intake and cystocele with moderate residuals.  She had seen an infectious disease specialist who confirmed my recommendation of concentrated cranberry juice>sweetened cranberry juice, crede maneuver to ensure complete bladder emptying and implementation of probiotics. She was placed on trimethoprim suppression in August and continues as suppressive antibiotic.   She had a routine follow-up with her primary care physician, Dr. [name], on April 18, 2019.  She states at that time she was having a week and a half of off-and-on dysuria, mucus in the urine and urgency to go to the bathroom every 2 hours. She was placed on Keflex and instructed to follow-up with [name].  She has developed some mild LBP and is wondering if her UTI has not been treated fully.  Her CATH UA was benign.  KUB Nonobstructed bowel gas pattern. No definite radiopaque urinary tract calculi identified.  Vaginal atrophy Denies any vaginal itching, vaginal discharge or vaginal bleeding.  Mammogram in 11/2018 was negative.   Using the vaginal estrogen cream three nights weekly   Complex renal cyst RUS in 09/2018 noted no evidence of hydronephrosis.  Redemonstration of 1 cm cyst of the lateral cortex of the left kidney.  PMH: Past Medical History:  Diagnosis Date  . Abnormal ECG 10/28/2017  . Anxiety   . Bradycardia 11/10/2017  . Cancer ([place])    skin ca  . Cystocele 09/13/2013   small recurrent, less than before 09/13/13 JVF   . Gilbert's syndrome   . Hearing loss 1975   . History of recurrent urinary tract infection 03/24/2016  . Hyperlipidemia 2000  . Hypertension 2000    Surgical History: Past Surgical History:  Procedure Laterality Date  . ABDOMINAL HYSTERECTOMY  2014   partial  . ANTERIOR AND POSTERIOR REPAIR N/A 05/02/2013   Procedure: ANTERIOR (CYSTOCELE) AND POSTERIOR REPAIR (RECTOCELE);  Surgeon: [name], MD;  Location: [facility];  Service: Gynecology;  Laterality: N/A;  . CATARACT EXTRACTION, BILATERAL    . COLONOSCOPY  08/28/2011   Procedure: COLONOSCOPY;  Surgeon: [name], MD;  Location: [facility];  Service: Endoscopy;  Laterality: N/A;  930  . EYE SURGERY Bilateral 02/13/2011   other 2013  . 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 TUBAL LIGATION    . VAGINAL HYSTERECTOMY N/A 05/02/2013   Procedure: HYSTERECTOMY VAGINAL;  Surgeon: [name], MD;  Location: [facility];  Service: Gynecology;  Laterality: N/A;    Home Medications:  Allergies as of 05/01/2019      Reactions   Ciprofloxacin    Tendinitis    Levaquin [levofloxacin] Other (See Comments)   insomnia   [name] Diarrhea   [name] Palpitations, Rash   Last reaction was in 1969      Medication List       Accurate as of May 01, 2019 11:59 PM. If you have any questions, ask your nurse or doctor.        aspirin EC 81 MG tablet Take 81 mg  by mouth at bedtime.   estradiol 0.1 MG/GM vaginal cream Commonly known as: ESTRACE VAGINAL Apply 0.5mg  (pea-sized amount)  just inside the vaginal introitus with a finger-tip on Monday, Wednesday and Friday nights.   hydrochlorothiazide 25 MG tablet Commonly known as: HYDRODIURIL TAKE 1 TABLET DAILY   metoprolol tartrate 25 MG tablet Commonly known as: LOPRESSOR TAKE ONE AND ONE-HALF TABLETS TWICE A DAY   ramipril 10 MG capsule Commonly known as: ALTACE Take 1 capsule (10 mg total) by mouth daily.   simvastatin 10 MG tablet Commonly known as: ZOCOR  Take 1 tablet (10 mg total) by mouth daily.   trimethoprim 100 MG tablet Commonly known as: TRIMPEX Take 1 tablet (100 mg total) by mouth daily.   VITAMIN D HIGH POTENCY PO Take by mouth.       Allergies:  Allergies  Allergen Reactions  . Ciprofloxacin     Tendinitis   . Levaquin [Levofloxacin] Other (See Comments)    insomnia  . [name] Diarrhea  . [name] Palpitations and Rash    Last reaction was in 1969    Family History: Family History  Problem Relation Age of Onset  . Hypertension Mother   . Cancer Mother        stomach  . Healthy Father   . Healthy Brother   . Colon cancer Neg Hx   . Prostate cancer Neg Hx   . Kidney cancer Neg Hx   . Bladder Cancer Neg Hx   . Breast cancer Neg Hx     Social History:  reports that she has never smoked. She has never used smokeless tobacco. She reports current alcohol use. She reports that she does not use drugs.  ROS: UROLOGY Frequent Urination?: No Hard to postpone urination?: No Burning/pain with urination?: No Get up at night to urinate?: Yes Leakage of urine?: No Urine stream starts and stops?: No Trouble starting stream?: Yes Do you have to strain to urinate?: No Blood in urine?: No Urinary tract infection?: Yes Sexually transmitted disease?: No Injury to kidneys or bladder?: No Painful intercourse?: No Weak stream?: No Currently pregnant?: No Vaginal bleeding?: No Last menstrual period?: n  Gastrointestinal Nausea?: No Vomiting?: No Indigestion/heartburn?: No Diarrhea?: No Constipation?: No  Constitutional Fever: No Night sweats?: No Weight loss?: No Fatigue?: No  Skin Skin rash/lesions?: No Itching?: No  Eyes Blurred vision?: No Double vision?: No  Ears/Nose/Throat Sore throat?: No Sinus problems?: No  Hematologic/Lymphatic Swollen glands?: No Easy bruising?: No  Cardiovascular Leg swelling?: No Chest pain?: No  Respiratory Cough?: No  Shortness of breath?: No  Endocrine Excessive thirst?: No  Musculoskeletal Back pain?: Yes Joint pain?: No  Neurological Headaches?: Yes Dizziness?: No  Psychologic Depression?: No Anxiety?: No  Physical Exam: BP 127/75   Pulse 63   Ht 5\' 2"  (1.575 m)   Wt 140 lb (63.5 kg)   BMI 25.61 kg/m   Constitutional:  Well nourished. Alert and oriented, No acute distress. HEENT: [place] AT, moist mucus membranes.  Trachea midline, no masses. Cardiovascular: No clubbing, cyanosis, or edema. Respiratory: Normal respiratory effort, no increased work of breathing. Neurologic: Grossly intact, no focal deficits, moving all 4 extremities. Psychiatric: Normal mood and affect.   Laboratory Data: Lab Results  Component Value Date   WBC 4.1 10/24/2018   HGB 14.3 10/24/2018   HCT 41.0 10/24/2018   MCV 90 10/24/2018   PLT 209 10/24/2018    Lab Results  Component Value Date   CREATININE 0.95 10/24/2018  No results found for: PSA  No results found for: TESTOSTERONE  Lab Results  Component Value Date   HGBA1C 5.6 07/23/2011    Lab Results  Component Value Date   TSH 3.270 10/24/2018       Component Value Date/Time   CHOL 173 10/24/2018 1009   HDL 66 10/24/2018 1009   CHOLHDL 2.6 10/24/2018 1009   CHOLHDL 2.1 09/25/2015 0822   VLDL 13 09/25/2015 0822   LDLCALC 93 10/24/2018 1009    Lab Results  Component Value Date   AST 18 10/24/2018   Lab Results  Component Value Date   ALT 20 10/24/2018   No components found for: ALKALINEPHOPHATASE No components found for: BILIRUBINTOTAL  No results found for: ESTRADIOL  Urinalysis Component     Latest Ref Rng & Units 12/30/2018  Specific Gravity, UA     1.005 - 1.030 <1.005 (L)  pH, UA     5.0 - 7.5 6.0  Color, UA     Yellow Yellow  Appearance Ur     Clear Clear  Leukocytes,UA     Negative Negative  Protein,UA     Negative/Trace Negative  Glucose, UA     NEGATIVE mg/dL Negative  Ketones, UA     Negative  Negative  RBC, UA     Negative Negative  Bilirubin, UA     Negative Negative  Urobilinogen, Ur     0.2 - 1.0 mg/dL 0.2  Nitrite, UA     Negative Negative  Microscopic Examination      See below:   Component     Latest Ref Rng & Units 05/01/2019          WBC, UA     0 - 5 WBC/hpf 0-5  RBC     0 - 2 /hpf   Epithelial Cells (non renal)     0 - 10 /hpf   Renal Epithel, UA     None seen /hpf   Bacteria, UA     NONE SEEN NONE SEEN    I have reviewed the labs.   Pertinent Imaging: CLINICAL DATA:  Low back pain for 2 weeks. History of UTI.  EXAM: ABDOMEN - 1 VIEW  COMPARISON:  CT abdomen and pelvis 03/16/2018  FINDINGS: Gas is present in the stomach and scattered loops of nondilated small and large bowel without evidence of obstruction. No definite radiopaque urinary tract calculi are identified. Small calcifications in the pelvis are compatible phleboliths. No acute osseous abnormality is identified.  IMPRESSION: Nonobstructed bowel gas pattern. No definite radiopaque urinary tract calculi identified.   Electronically Signed   By: Logan Bores M.D.   On: 05/01/2019 16:20 I have independently reviewed the films and do not appreciate any stones.     Assessment & Plan:   1. History of rUTI's Completed Keflex, still having vague symptoms UA is benign, but will send for culture to rule out indolent infection  2. Vaginal atrophy Continue the vaginal estrogen cream three nights weekly  3. Complex renal cyst RUS in April 2021   Return for pending urine culture results .  These notes generated with voice recognition software. I apologize for typographical errors.  Zara Council, PA-C  [facility] [address]  [place], [place] [address] [phone]

## 2019-05-01 NOTE — Progress Notes (Signed)
In and Out Catheterization  Patient is present today for a I & O catheterization due to UTI symptoms. Patient was cleaned and prepped in a sterile fashion with betadine and Lidocaine 2% jelly was instilled into the urethra.  A 14FR cath was inserted no complications were noted , 410ml of urine return was noted, urine was yellow in color. A clean urine sample was collected for UA. Bladder was drained  And catheter was removed with out difficulty.    Preformed by: Elberta Leatherwood, CMA

## 2019-05-02 LAB — URINE CULTURE: Culture: NO GROWTH

## 2019-05-03 ENCOUNTER — Telehealth: Payer: Self-pay | Admitting: Family Medicine

## 2019-05-03 NOTE — Telephone Encounter (Signed)
LMOM notified of negative result.

## 2019-05-03 NOTE — Telephone Encounter (Signed)
-----   Message from Nori Riis, PA-C sent at 05/03/2019  7:44 AM EST ----- Please let Mrs. Reiley know that her urine culture was negative.

## 2019-05-10 DIAGNOSIS — I340 Nonrheumatic mitral (valve) insufficiency: Secondary | ICD-10-CM | POA: Diagnosis not present

## 2019-05-10 DIAGNOSIS — I493 Ventricular premature depolarization: Secondary | ICD-10-CM | POA: Diagnosis not present

## 2019-05-10 DIAGNOSIS — I10 Essential (primary) hypertension: Secondary | ICD-10-CM | POA: Diagnosis not present

## 2019-05-10 DIAGNOSIS — E782 Mixed hyperlipidemia: Secondary | ICD-10-CM | POA: Diagnosis not present

## 2019-06-21 ENCOUNTER — Encounter: Payer: Self-pay | Admitting: Urology

## 2019-06-21 ENCOUNTER — Other Ambulatory Visit: Payer: Self-pay

## 2019-06-21 ENCOUNTER — Ambulatory Visit (INDEPENDENT_AMBULATORY_CARE_PROVIDER_SITE_OTHER): Payer: Medicare HMO | Admitting: Urology

## 2019-06-21 VITALS — BP 122/76 | HR 78 | Ht 62.0 in | Wt 140.0 lb

## 2019-06-21 DIAGNOSIS — N952 Postmenopausal atrophic vaginitis: Secondary | ICD-10-CM

## 2019-06-21 DIAGNOSIS — N390 Urinary tract infection, site not specified: Secondary | ICD-10-CM

## 2019-06-21 DIAGNOSIS — Z87440 Personal history of urinary (tract) infections: Secondary | ICD-10-CM | POA: Diagnosis not present

## 2019-06-21 DIAGNOSIS — N281 Cyst of kidney, acquired: Secondary | ICD-10-CM

## 2019-06-21 LAB — BLADDER SCAN AMB NON-IMAGING: Scan Result: 314

## 2019-06-21 NOTE — Progress Notes (Signed)
06/21/2019 2:03 PM   Rebecca Gutierrez February 17, 1941 [id number]  Referring provider: [name], [place],   [address]  No chief complaint on file.   HPI: Rebecca Gutierrez is a 79 year old female with a history of rUTI's, vaginal atrophy, complex renal cyst and a cystocele who presents today for the complaint of cloudy urine.  History of rUTI's Risk factors: age, vaginal atrophy, limited fluid intake and cystocele with moderate residuals.   She was placed on trimethoprim suppression in August 2020 and continues as suppressive antibiotic.   KUB 04/2019 Nonobstructed bowel gas pattern. No definite radiopaque urinary tract calculi identified.  Vaginal atrophy Denies any vaginal itching, vaginal discharge or vaginal bleeding.  Mammogram in 11/2018 was negative.   Using the vaginal estrogen cream three nights weekly   Complex renal cyst RUS in 09/2018 noted no evidence of hydronephrosis.  Redemonstration of 1 cm cyst of the lateral cortex of the left kidney.  This was shown to represent a simple cyst in June of 2018 on prior lumbar spine MRI and is stable in appearance.  Cystocele No longer using pessary.   Today she presents stating that she feels dry in the vaginal area and she has noticed mucus in her urine intermittently.  She continues to have baseline symptom is nocturia and hesitancy.  Patient denies any gross hematuria, dysuria or suprapubic/flank pain.  Patient denies any fevers, chills, nausea or vomiting.   UA 06/30/2019 negative.  PVR 314 mL.    PMH: Past Medical History:  Diagnosis Date  . Abnormal ECG 10/28/2017  . Anxiety   . Bradycardia 11/10/2017  . Cancer ([place])    skin ca  . Cystocele 09/13/2013   small recurrent, less than before 09/13/13 JVF   . Gilbert's syndrome   . Hearing loss 1975  . History of recurrent urinary tract infection 03/24/2016  . Hyperlipidemia 2000  . Hypertension 2000    Surgical History: Past Surgical  History:  Procedure Laterality Date  . ABDOMINAL HYSTERECTOMY  2014   partial  . ANTERIOR AND POSTERIOR REPAIR N/A 05/02/2013   Procedure: ANTERIOR (CYSTOCELE) AND POSTERIOR REPAIR (RECTOCELE);  Surgeon: [name], MD;  Location: [facility];  Service: Gynecology;  Laterality: N/A;  . CATARACT EXTRACTION, BILATERAL    . COLONOSCOPY  08/28/2011   Procedure: COLONOSCOPY;  Surgeon: [name], MD;  [facility];  Service: Endoscopy;  Laterality: N/A;  930  . EYE SURGERY Bilateral 02/13/2011   other 2013  . 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 TUBAL LIGATION    . VAGINAL HYSTERECTOMY N/A 05/02/2013   Procedure: HYSTERECTOMY VAGINAL;  Surgeon: [name], MD;  Location: [facility];  Service: Gynecology;  Laterality: N/A;    Home Medications:  Allergies as of 06/21/2019      Reactions   Ciprofloxacin    Tendinitis    Levaquin [levofloxacin] Other (See Comments)   insomnia   [name] Diarrhea   [name] Palpitations, Rash   Last reaction was in 1969      Medication List       Accurate as of June 21, 2019 11:59 PM. If you have any questions, ask your nurse or doctor.        aspirin EC 81 MG tablet Take 81 mg by mouth at bedtime.   estradiol 0.1 MG/GM vaginal cream Commonly known as: ESTRACE VAGINAL Apply 0.5mg  (pea-sized amount)  just  inside the vaginal introitus with a finger-tip on Monday, Wednesday and Friday nights.   hydrochlorothiazide 25 MG tablet Commonly known as: HYDRODIURIL TAKE 1 TABLET DAILY   metoprolol tartrate 25 MG tablet Commonly known as: LOPRESSOR TAKE ONE AND ONE-HALF TABLETS TWICE A DAY   ramipril 10 MG capsule Commonly known as: ALTACE Take 1 capsule (10 mg total) by mouth daily.   simvastatin 10 MG tablet Commonly known as: ZOCOR Take 1 tablet (10 mg total) by mouth daily.   trimethoprim 100 MG tablet Commonly known as: TRIMPEX Take 1 tablet (100 mg total) by mouth  daily.   VITAMIN D HIGH POTENCY PO Take by mouth.       Allergies:  Allergies  Allergen Reactions  . Ciprofloxacin     Tendinitis   . Levaquin [Levofloxacin] Other (See Comments)    insomnia  . [name] Diarrhea  . [name] Palpitations and Rash    Last reaction was in 1969    Family History: Family History  Problem Relation Age of Onset  . Hypertension Mother   . Cancer Mother        stomach  . Healthy Father   . Healthy Brother   . Colon cancer Neg Hx   . Prostate cancer Neg Hx   . Kidney cancer Neg Hx   . Bladder Cancer Neg Hx   . Breast cancer Neg Hx     Social History:  reports that she has never smoked. She has never used smokeless tobacco. She reports current alcohol use. She reports that she does not use drugs.  ROS: UROLOGY Frequent Urination?: No Hard to postpone urination?: No Burning/pain with urination?: No Get up at night to urinate?: Yes Leakage of urine?: No Urine stream starts and stops?: No Trouble starting stream?: Yes Do you have to strain to urinate?: No Blood in urine?: No Urinary tract infection?: Yes Sexually transmitted disease?: No Injury to kidneys or bladder?: No Painful intercourse?: No Weak stream?: No Currently pregnant?: No Vaginal bleeding?: No Last menstrual period?: n  Gastrointestinal Nausea?: No Vomiting?: No Indigestion/heartburn?: No Diarrhea?: No Constipation?: No  Constitutional Fever: No Night sweats?: No Weight loss?: No Fatigue?: No  Skin Skin rash/lesions?: No Itching?: No  Eyes Blurred vision?: No Double vision?: No  Ears/Nose/Throat Sore throat?: No Sinus problems?: No  Hematologic/Lymphatic Swollen glands?: No Easy bruising?: No  Cardiovascular Leg swelling?: No Chest pain?: No  Respiratory Cough?: No Shortness of breath?: No  Endocrine Excessive thirst?: No  Musculoskeletal Back pain?: No Joint pain?: No  Neurological Headaches?:  No Dizziness?: No  Psychologic Depression?: No Anxiety?: No  Physical Exam: BP 122/76   Pulse 78   Ht 5\' 2"  (1.575 m)   Wt 140 lb (63.5 kg)   BMI 25.61 kg/m   Constitutional:  Well nourished. Alert and oriented, No acute distress. HEENT: [place] AT, mask in place.  Trachea midline, no masses. Cardiovascular: No clubbing, cyanosis, or edema. Respiratory: Normal respiratory effort, no increased work of breathing. GI: Abdomen is soft, non tender, non distended, no abdominal masses. Liver and spleen not palpable.  No hernias appreciated.  Stool sample for occult testing is not indicated.   GU: No CVA tenderness.  No bladder fullness or masses.  Atrophic external genitalia, sparse pubic hair distribution, no lesions.  Normal urethral meatus, no lesions, no prolapse, no discharge.   No urethral masses, tenderness and/or tenderness. No bladder fullness, tenderness or masses. Pale vagina mucosa, fair estrogen effect, no discharge, no lesions,  Poor pelvic  support, grade II cystocele and no rectocele noted.  Cervix and uterus are surgically absent.  No adnexal/parametria masses or tenderness noted.  Anus and perineum are without rashes or lesions.    Skin: No rashes, bruises or suspicious lesions. Lymph: No inguinal adenopathy. Neurologic: Grossly intact, no focal deficits, moving all 4 extremities. Psychiatric: Normal mood and affect.   Laboratory Data: Lab Results  Component Value Date   WBC 4.1 10/24/2018   HGB 14.3 10/24/2018   HCT 41.0 10/24/2018   MCV 90 10/24/2018   PLT 209 10/24/2018    Lab Results  Component Value Date   CREATININE 0.95 10/24/2018    No results found for: PSA  No results found for: TESTOSTERONE  Lab Results  Component Value Date   HGBA1C 5.6 07/23/2011    Lab Results  Component Value Date   TSH 3.270 10/24/2018       Component Value Date/Time   CHOL 173 10/24/2018 1009   HDL 66 10/24/2018 1009   CHOLHDL 2.6 10/24/2018 1009   CHOLHDL 2.1  09/25/2015 0822   VLDL 13 09/25/2015 0822   LDLCALC 93 10/24/2018 1009    Lab Results  Component Value Date   AST 18 10/24/2018   Lab Results  Component Value Date   ALT 20 10/24/2018   No components found for: ALKALINEPHOPHATASE No components found for: BILIRUBINTOTAL  No results found for: ESTRADIOL  Urinalysis Component     Latest Ref Rng & Units 06/21/2019  Specific Gravity, UA     1.005 - 1.030 <1.005 (L)  pH, UA     5.0 - 7.5 6.0  Color, UA     Yellow Yellow  Appearance Ur     Clear Clear  Leukocytes,UA     Negative Negative  Protein,UA     Negative/Trace Negative  Glucose, UA     Negative Negative  Ketones, UA     Negative Negative  RBC, UA     Negative Negative  Bilirubin, UA     Negative Negative  Urobilinogen, Ur     0.2 - 1.0 mg/dL 0.2  Nitrite, UA     Negative Negative  Microscopic Examination      See below:   Component     Latest Ref Rng & Units 06/21/2019          WBC, UA     0 - 5 /hpf 0-5  RBC     0 - 2 /hpf None seen  Epithelial Cells (non renal)     0 - 10 /hpf 0-10  Bacteria, UA     None seen/Few None seen    I have reviewed the labs.   Pertinent Imaging: Results for ANMARIE, GHARIBIAN (MRN [id number]) as of 06/21/2019 15:51  Ref. Range 06/21/2019 15:05  Scan Result Unknown [id number]   In and Out Catheterization Patient is present today for a I & O catheterization due to possible UTI. Patient was cleaned and prepped in a sterile fashion with betadine . A 14 FR cath was inserted no complications were noted , 300 ml of urine return was noted, urine was yellow clear in color. A clean urine sample was collected for inspection. Bladder was drained  And catheter was removed with out difficulty.    Performed by: Zara Council, PA-C   Assessment & Plan:    1. rUTI's Reassured Rebecca Gutierrez that her urinalysis was clear of infection, but we will send for culture to be thorough and I did not appreciate a vaginal  discharge or signs of a  vaginal infection during my exam.  I did not see any mucus strands in the urine and I did not appreciate any mucus type substance in the vaginal canal or around the anus.  If it continues to be bothersome to her, I have given her a urine specimen cup to collect her urine at a time when the mucus presents itself.  2. Vaginal atrophy Continue the vaginal estrogen cream 3 nights weekly  3. Left complex renal cyst Return in April 2021 for surveillance renal ultrasound  Return for keep appointment in April .  These notes generated with voice recognition software. I apologize for typographical errors.  Zara Council, PA-C  [facility] [address]  [place], [place] [address] [phone]   I spent 30 minutes with Rebecca Gutierrez in a face-to-face conversation gathering history, examination and discussion on what the possible etiology of the mucus in her urine.

## 2019-06-23 LAB — MICROSCOPIC EXAMINATION
Bacteria, UA: NONE SEEN
RBC, Urine: NONE SEEN /hpf (ref 0–2)

## 2019-06-23 LAB — URINALYSIS, COMPLETE
Bilirubin, UA: NEGATIVE
Glucose, UA: NEGATIVE
Ketones, UA: NEGATIVE
Leukocytes,UA: NEGATIVE
Nitrite, UA: NEGATIVE
Protein,UA: NEGATIVE
RBC, UA: NEGATIVE
Specific Gravity, UA: <1.005 — ABNORMAL LOW (ref 1.005–1.030)
Urobilinogen, Ur: 0.2 mg/dL (ref 0.2–1.0)
pH, UA: 6 (ref 5.0–7.5)

## 2019-06-25 LAB — CULTURE, URINE COMPREHENSIVE

## 2019-08-07 ENCOUNTER — Encounter: Payer: Self-pay | Admitting: Urology

## 2019-08-07 ENCOUNTER — Other Ambulatory Visit: Payer: Self-pay

## 2019-08-07 ENCOUNTER — Other Ambulatory Visit
Admission: RE | Admit: 2019-08-07 | Discharge: 2019-08-07 | Disposition: A | Discharge status: Home / Self Care | Payer: Medicare HMO | Attending: Urology | Admitting: Urology

## 2019-08-07 ENCOUNTER — Ambulatory Visit (INDEPENDENT_AMBULATORY_CARE_PROVIDER_SITE_OTHER): Payer: Medicare HMO | Admitting: Urology

## 2019-08-07 VITALS — BP 145/74 | HR 73 | Ht 62.0 in | Wt 140.0 lb

## 2019-08-07 DIAGNOSIS — N390 Urinary tract infection, site not specified: Secondary | ICD-10-CM

## 2019-08-07 DIAGNOSIS — N952 Postmenopausal atrophic vaginitis: Secondary | ICD-10-CM

## 2019-08-07 DIAGNOSIS — N281 Cyst of kidney, acquired: Secondary | ICD-10-CM | POA: Diagnosis not present

## 2019-08-07 LAB — URINALYSIS, COMPLETE (UACMP) WITH MICROSCOPIC
Bilirubin Urine: NEGATIVE
Glucose, UA: NEGATIVE mg/dL
Ketones, ur: NEGATIVE mg/dL
Nitrite: NEGATIVE
Protein, ur: NEGATIVE mg/dL
RBC / HPF: NONE SEEN RBC/hpf (ref 0–5)
Specific Gravity, Urine: 1.01 (ref 1.005–1.030)
WBC, UA: >50 WBC/hpf (ref 0–5)
pH: 6.5 (ref 5.0–8.0)

## 2019-08-07 MED ORDER — CEPHALEXIN 500 MG PO CAPS: 500.0000 mg | ORAL_CAPSULE | Freq: Two times a day (BID) | ORAL | 0 refills | Status: DC

## 2019-08-07 NOTE — Progress Notes (Signed)
08/07/2019 3:28 PM   Fernande Ortego Huntsman [birth year] [id number]  Referring provider: [name], MD [address] [place],  [place] [address]  Chief Complaint  Patient presents with  . Recurrent UTI    HPI: Mrs. Rozenberg is a 79 year old female with a history of rUTI's, vaginal atrophy, complex renal cyst and a cystocele who presents today for the a possible UTI that started Friday night.    History of rUTI's Risk factors: age, vaginal atrophy, limited fluid intake and cystocele with moderate residuals.   She was placed on trimethoprim suppression in August 2020 and continues on suppressive antibiotic.   KUB 04/2019 Nonobstructed bowel gas pattern. No definite radiopaque urinary tract calculi identified.  She has been experiencing some lower back pain, dysuria and cloudy urine.  Patient denies any modifying or aggravating factors.  Patient denies any gross hematuria or suprapubic/flank pain.  Patient denies any fevers, chills, nausea or vomiting.  CATH UA yellow cloudy, specific gravity 1.010, pH 6.5, trace blood, moderate leukocytes, 0-5 squamous epithelial cells, greater than 50 WBC's and few bacteria.  PVR is 150 mL.    Vaginal atrophy Denies any vaginal itching, vaginal discharge or vaginal bleeding.  Mammogram in 11/2018 was negative.   Using the vaginal estrogen cream three nights weekly   Complex renal cyst RUS in 09/2018 noted no evidence of hydronephrosis.  Redemonstration of 1 cm cyst of the lateral cortex of the left kidney.  This was shown to represent a simple cyst in June of 2018 on prior lumbar spine MRI and is stable in appearance.  Repeat RUS scheduled for April 2021.   Cystocele No longer using pessary.   PMH: Past Medical History:  Diagnosis Date  . Abnormal ECG 10/28/2017  . Anxiety   . Bradycardia 11/10/2017  . Cancer ([place])    skin ca  . Cystocele 09/13/2013   small recurrent, less than before 09/13/13 JVF   . Gilbert's syndrome   . Hearing  loss 1975  . History of recurrent urinary tract infection 03/24/2016  . Hyperlipidemia 2000  . Hypertension 2000    Surgical History: Past Surgical History:  Procedure Laterality Date  . ABDOMINAL HYSTERECTOMY  2014   partial  . ANTERIOR AND POSTERIOR REPAIR N/A 05/02/2013   Procedure: ANTERIOR (CYSTOCELE) AND POSTERIOR REPAIR (RECTOCELE);  Surgeon: [name], MD;  Location: [facility];  Service: Gynecology;  Laterality: N/A;  . CATARACT EXTRACTION, BILATERAL    . COLONOSCOPY  08/28/2011   Procedure: COLONOSCOPY;  Surgeon: [name], MD;  Location: [facility];  Service: Endoscopy;  Laterality: N/A;  930  . EYE SURGERY Bilateral 02/13/2011   other 2013  . 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 TUBAL LIGATION    . VAGINAL HYSTERECTOMY N/A 05/02/2013   Procedure: HYSTERECTOMY VAGINAL;  Surgeon: [name], MD;  Location: [facility];  Service: Gynecology;  Laterality: N/A;    Home Medications:  Allergies as of 08/07/2019      Reactions   Ciprofloxacin    Tendinitis    Levaquin [levofloxacin] Other (See Comments)   insomnia   [name] Diarrhea   [name] Palpitations, Rash   Last reaction was in 1969      Medication List       Accurate as of August 07, 2019 11:59 PM. If you have any questions, ask your nurse or doctor.        aspirin EC 81  MG tablet Take 81 mg by mouth at bedtime.   cephALEXin 500 MG capsule Commonly known as: Keflex Take 1 capsule (500 mg total) by mouth 2 (two) times daily. Started by: Zara Council, PA-C   estradiol 0.1 MG/GM vaginal cream Commonly known as: ESTRACE VAGINAL Apply 0.5mg  (pea-sized amount)  just inside the vaginal introitus with a finger-tip on Monday, Wednesday and Friday nights.   hydrochlorothiazide 25 MG tablet Commonly known as: HYDRODIURIL TAKE 1 TABLET DAILY   metoprolol tartrate 25 MG tablet Commonly known as: LOPRESSOR TAKE ONE AND ONE-HALF TABLETS  TWICE A DAY   ramipril 10 MG capsule Commonly known as: ALTACE Take 1 capsule (10 mg total) by mouth daily.   simvastatin 10 MG tablet Commonly known as: ZOCOR Take 1 tablet (10 mg total) by mouth daily.   trimethoprim 100 MG tablet Commonly known as: TRIMPEX Take 1 tablet (100 mg total) by mouth daily.   VITAMIN D HIGH POTENCY PO Take by mouth.       Allergies:  Allergies  Allergen Reactions  . Ciprofloxacin     Tendinitis   . Levaquin [Levofloxacin] Other (See Comments)    insomnia  . [name] Diarrhea  . [name] Palpitations and Rash    Last reaction was in 1969    Family History: Family History  Problem Relation Age of Onset  . Hypertension Mother   . Cancer Mother        stomach  . Healthy Father   . Healthy Brother   . Colon cancer Neg Hx   . Prostate cancer Neg Hx   . Kidney cancer Neg Hx   . Bladder Cancer Neg Hx   . Breast cancer Neg Hx     Social History:  reports that she has never smoked. She has never used smokeless tobacco. She reports current alcohol use. She reports that she does not use drugs.  ROS: For pertinent review of systems please refer to history of present illness  Physical Exam: BP (!) 145/74   Pulse 73   Ht 5\' 2"  (1.575 m)   Wt 140 lb (63.5 kg)   BMI 25.61 kg/m   Constitutional:  Well nourished. Alert and oriented, No acute distress. HEENT: [place] AT, mask in place.  Trachea midline, no masses. Cardiovascular: No clubbing, cyanosis, or edema. Respiratory: Normal respiratory effort, no increased work of breathing. GI: Abdomen is soft, non tender, non distended, no abdominal masses.  GU: No CVA tenderness.  No bladder fullness or masses.  Atrophic external genitalia, sparse pubic hair distribution, no lesions.  Normal urethral meatus, no lesions, no prolapse, no discharge.   No urethral masses, tenderness and/or tenderness. No bladder fullness, tenderness or masses.  Pale vagina mucosa, fair estrogen  effect, no discharge, no lesions, poor pelvic support, grade II cystocele and no rectocele noted.   Anus and perineum are without rashes or lesions.    Skin: No rashes, bruises or suspicious lesions. Lymph: No inguinal adenopathy. Neurologic: Grossly intact, no focal deficits, moving all 4 extremities. Psychiatric: Normal mood and affect.   Laboratory Data: Lab Results  Component Value Date   WBC 4.1 10/24/2018   HGB 14.3 10/24/2018   HCT 41.0 10/24/2018   MCV 90 10/24/2018   PLT 209 10/24/2018    Lab Results  Component Value Date   CREATININE 0.95 10/24/2018    Lab Results  Component Value Date   HGBA1C 5.6 07/23/2011    Lab Results  Component Value Date   TSH 3.270 10/24/2018  Component Value Date/Time   CHOL 173 10/24/2018 1009   HDL 66 10/24/2018 1009   CHOLHDL 2.6 10/24/2018 1009   CHOLHDL 2.1 09/25/2015 0822   VLDL 13 09/25/2015 0822   LDLCALC 93 10/24/2018 1009    Lab Results  Component Value Date   AST 18 10/24/2018   Lab Results  Component Value Date   ALT 20 10/24/2018    Urinalysis Component     Latest Ref Rng & Units 08/07/2019  Color, Urine     YELLOW YELLOW  Appearance     CLEAR CLOUDY (A)  Specific Gravity, Urine     1.005 - 1.030 1.010  pH     5.0 - 8.0 6.5  Glucose, UA     NEGATIVE mg/dL NEGATIVE  Hgb urine dipstick     NEGATIVE TRACE (A)  Bilirubin Urine     NEGATIVE NEGATIVE  Ketones, ur     NEGATIVE mg/dL NEGATIVE  Protein     NEGATIVE mg/dL NEGATIVE  Nitrite     NEGATIVE NEGATIVE  Leukocytes,Ua     NEGATIVE MODERATE (A)  Squamous Epithelial / LPF     0 - 5 0-5  WBC, UA     0 - 5 WBC/hpf >50  RBC / HPF     0 - 5 RBC/hpf NONE SEEN  Bacteria, UA     NONE SEEN FEW (A)   I have reviewed the labs.   Pertinent Imaging: No imaging since last visit  In and Out Catheterization Patient is present today for a I & O catheterization due to possible UTI. Patient was cleaned and prepped in a sterile fashion with  betadine . A 14 FR cath was inserted no complications were noted , 150 ml of urine return was noted, urine was yellow cloudy in color. A clean urine sample was collected for UA and urine culture. Bladder was drained  And catheter was removed with out difficulty.    Performed by: Zara Council, PA-C   Assessment & Plan:    1. rUTI's CATH UA with pyuria  Urine sent for culture, will start Keflex empirically and adjust if necessary once urine culture and sensitivities have returned Discontinue trimethoprim at this time  2. Vaginal atrophy Continue the vaginal estrogen cream 3 nights weekly  3. Left complex renal cyst Return in April 2021 for surveillance renal ultrasound  Return for Keep appointment in April 2021 .  These notes generated with voice recognition software. I apologize for typographical errors.  Zara Council, PA-C  [facility] [address]  [place], [place] [address] [phone]

## 2019-08-09 ENCOUNTER — Telehealth: Payer: Self-pay | Admitting: Family Medicine

## 2019-08-09 LAB — URINE CULTURE: Culture: >=100000 — AB

## 2019-08-09 MED ORDER — NITROFURANTOIN MONOHYD MACRO 100 MG PO CAPS: 100.0000 mg | ORAL_CAPSULE | Freq: Two times a day (BID) | ORAL | 0 refills | Status: DC

## 2019-08-09 NOTE — Telephone Encounter (Signed)
Patient notified and ABX sent to pharmacy.  

## 2019-08-09 NOTE — Telephone Encounter (Signed)
-----   Message from Nori Riis, PA-C sent at 08/09/2019  8:19 AM EST ----- Please let Mrs. Reasner know that her urine culture was positive for infection and we need to switch her to Macrobid 100 mg, BID x 7 days.

## 2019-09-27 ENCOUNTER — Ambulatory Visit: Payer: Medicare HMO | Admitting: Internal Medicine

## 2019-10-02 ENCOUNTER — Ambulatory Visit (INDEPENDENT_AMBULATORY_CARE_PROVIDER_SITE_OTHER): Payer: Medicare HMO | Admitting: Internal Medicine

## 2019-10-02 ENCOUNTER — Encounter: Payer: Self-pay | Admitting: Internal Medicine

## 2019-10-02 ENCOUNTER — Other Ambulatory Visit: Payer: Self-pay

## 2019-10-02 VITALS — BP 132/74 | HR 75 | Temp 97.1°F | Ht 62.0 in | Wt 142.0 lb

## 2019-10-02 DIAGNOSIS — M79602 Pain in left arm: Secondary | ICD-10-CM | POA: Diagnosis not present

## 2019-10-02 DIAGNOSIS — I10 Essential (primary) hypertension: Secondary | ICD-10-CM

## 2019-10-02 NOTE — Progress Notes (Signed)
Date:  10/02/2019   Name:  Rebecca Gutierrez   DOB:  03-06-1941   MRN:  [id number]   Chief Complaint: Arm Pain (Started 1 week ago- pain in elbow. Nausea- mouth filled up with water. Felt slightly lightheaded. Arm was sore in upper arm the next arm. Unsure of why she was having these pains. Few days later the pain was gone. Feels fine today. )  Immunization History  Administered Date(s) Administered  . Fluad Quad(high Dose 65+) 04/18/2019  . Influenza Split 04/01/2015  . Influenza Whole 03/25/2006, 05/25/2011  . Influenza, High Dose Seasonal PF 03/31/2017, 04/22/2018  . Influenza,inj,Quad PF,6+ Mos 03/01/2013, 03/14/2014, 03/24/2016  . PFIZER SARS-COV-2 Vaccination 06/29/2019, 07/20/2019  . Pneumococcal Conjugate-13 09/17/2014  . Pneumococcal Polysaccharide-23 09/23/2009  . Td 02/04/2004, 03/14/2014  . Zoster 07/17/2010  . Zoster Recombinat (Shingrix) 01/09/2019, 05/18/2019    Arm Pain  The incident occurred 3 to 5 days ago. There was no injury mechanism. The pain is present in the upper left arm. The pain does not radiate. The patient is experiencing no pain. Pain course: now resolved. Pertinent negatives include no chest pain.  Hypertension This is a chronic problem. The problem is controlled. Pertinent negatives include no chest pain, headaches, palpitations or shortness of breath. Past treatments include ACE inhibitors, beta blockers and diuretics. The current treatment provides significant improvement. There are no compliance problems.     Lab Results  Component Value Date   CREATININE 0.95 10/24/2018   BUN 22 10/24/2018   NA 138 10/24/2018   K 3.7 10/24/2018   CL 101 10/24/2018   CO2 23 10/24/2018   Lab Results  Component Value Date   CHOL 173 10/24/2018   HDL 66 10/24/2018   LDLCALC 93 10/24/2018   TRIG 71 10/24/2018   CHOLHDL 2.6 10/24/2018   Lab Results  Component Value Date   TSH 3.270 10/24/2018   Lab Results  Component Value Date   HGBA1C 5.6  07/23/2011   Lab Results  Component Value Date   WBC 4.1 10/24/2018   HGB 14.3 10/24/2018   HCT 41.0 10/24/2018   MCV 90 10/24/2018   PLT 209 10/24/2018   Lab Results  Component Value Date   ALT 20 10/24/2018   AST 18 10/24/2018   ALKPHOS 72 10/24/2018   BILITOT 0.9 10/24/2018     Review of Systems  Constitutional: Negative for chills, fatigue and fever.  Respiratory: Negative for cough, chest tightness, shortness of breath and wheezing.   Cardiovascular: Negative for chest pain, palpitations and leg swelling.  Gastrointestinal: Negative for abdominal pain.  Neurological: Negative for dizziness and headaches.  Psychiatric/Behavioral: Negative for sleep disturbance.    Patient Active Problem List   Diagnosis Date Noted  . Cystocele, midline 04/11/2018  . Moderate mitral insufficiency 11/10/2017  . 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 Recurrent UTI 03/10/2015  . OSTEOPENIA 06/28/2009  . Hyperlipidemia LDL goal <100 06/02/2007  . 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 Hearing loss 06/02/2007  . Essential hypertension, benign 06/02/2007  . Acute bilateral low back pain with bilateral sciatica 06/02/2007    Allergies  Allergen Reactions  . Ciprofloxacin     Tendinitis   . Levaquin [Levofloxacin] Other (See Comments)    insomnia  . [name] Diarrhea  . [name] Palpitations and Rash    Last reaction was in 1969    Past Surgical History:  Procedure Laterality Date  . ABDOMINAL HYSTERECTOMY  2014   partial  . ANTERIOR AND POSTERIOR  REPAIR N/A 05/02/2013   Procedure: ANTERIOR (CYSTOCELE) AND POSTERIOR REPAIR (RECTOCELE);  Surgeon: [name], MD;  Location: [facility];  Service: Gynecology;  Laterality: N/A;  . CATARACT EXTRACTION, BILATERAL    . COLONOSCOPY  08/28/2011   Procedure: COLONOSCOPY;  Surgeon: [name], MD;  [facility]: [facility];  Service: Endoscopy;  Laterality: N/A;  930  . EYE SURGERY Bilateral 02/13/2011    other 2013  . 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 TUBAL LIGATION    . VAGINAL HYSTERECTOMY N/A 05/02/2013   Procedure: HYSTERECTOMY VAGINAL;  Surgeon: [name], MD;  Location: [facility];  Service: Gynecology;  Laterality: N/A;    Social History   Tobacco Use  . Smoking status: Never Smoker  . Smokeless tobacco: Never Used  . Tobacco comment: smoking cessation materials not required  Substance Use Topics  . Alcohol use: Yes    Comment: one drink per week  . Drug use: No     Medication list has been reviewed and updated.  Current Meds  Medication Sig  . aspirin EC 81 MG tablet Take 81 mg by mouth at bedtime.   . Cholecalciferol (VITAMIN D HIGH POTENCY PO) Take by mouth.  . estradiol (ESTRACE VAGINAL) 0.1 MG/GM vaginal cream Apply 0.5mg  (pea-sized amount)  just inside the vaginal introitus with a finger-tip on Monday, Wednesday and Friday nights.  . hydrochlorothiazide (HYDRODIURIL) 25 MG tablet TAKE 1 TABLET DAILY  . metoprolol tartrate (LOPRESSOR) 25 MG tablet TAKE ONE AND ONE-HALF TABLETS TWICE A DAY  . ramipril (ALTACE) 10 MG capsule Take 1 capsule (10 mg total) by mouth daily.  . simvastatin (ZOCOR) 10 MG tablet Take 1 tablet (10 mg total) by mouth daily.  Marland Kitchen trimethoprim (TRIMPEX) 100 MG tablet Take 1 tablet (100 mg total) by mouth daily.    PHQ 2/9 Scores 10/02/2019 04/18/2019 10/24/2018 10/10/2018  PHQ - 2 Score 0 0 0 0  PHQ- 9 Score 0 - - -    BP Readings from Last 3 Encounters:  10/02/19 132/74  08/07/19 (!) 145/74  06/21/19 122/76    Physical Exam Vitals and nursing note reviewed.  Constitutional:      General: She is not in acute distress.    Appearance: She is well-developed.  HENT:     Head: Normocephalic and atraumatic.  Cardiovascular:     Rate and Rhythm: Normal rate and regular rhythm. Occasional extrasystoles are present.    Pulses: Normal pulses.  Pulmonary:     Effort: Pulmonary effort is normal. No respiratory  distress.     Breath sounds: No wheezing or rhonchi.  Musculoskeletal:        General: Normal range of motion.     Right shoulder: Normal.     Left shoulder: Normal.     Right upper arm: Normal.     Left upper arm: Normal.     Right elbow: Normal.     Left elbow: Normal.     Right wrist: Normal.     Left wrist: Normal.     Cervical back: Normal range of motion.     Right lower leg: No edema.     Left lower leg: No edema.  Lymphadenopathy:     Cervical: No cervical adenopathy.  Skin:    General: Skin is warm and dry.     Findings: No rash.  Neurological:     Mental Status: She is alert and oriented to person, place, and  time.  Psychiatric:        Attention and Perception: Attention normal.        Mood and Affect: Mood normal.     Wt Readings from Last 3 Encounters:  10/02/19 142 lb (64.4 kg)  08/07/19 140 lb (63.5 kg)  06/21/19 140 lb (63.5 kg)    BP 132/74   Pulse 75   Temp (!) 97.1 F (36.2 C) (Temporal)   Ht 5\' 2"  (1.575 m)   Wt 142 lb (64.4 kg)   SpO2 95%   BMI 25.97 kg/m   Assessment and Plan: 1. Pain of left upper extremity No abnormality found on exam Suspect mild joint stiffness causing elbow discomfort No explanation for upper arm discomfort which has not resolved Return if sx recur  2. Essential hypertension, benign Clinically stable exam with well controlled BP on three medications. Tolerating medications without side effects at this time. Pt to continue current regimen and low sodium diet; benefits of regular exercise as able discussed.    Partially dictated using Editor, commissioning. Any errors are unintentional.  Halina Maidens, MD [facility]  10/02/2019

## 2019-10-03 ENCOUNTER — Other Ambulatory Visit: Payer: Self-pay

## 2019-10-03 ENCOUNTER — Other Ambulatory Visit: Payer: Self-pay | Admitting: Internal Medicine

## 2019-10-03 ENCOUNTER — Ambulatory Visit
Admission: RE | Admit: 2019-10-03 | Discharge: 2019-10-03 | Disposition: A | Discharge status: Home / Self Care | Payer: Medicare HMO | Source: Ambulatory Visit | Attending: Urology | Admitting: Urology

## 2019-10-03 DIAGNOSIS — N281 Cyst of kidney, acquired: Secondary | ICD-10-CM | POA: Diagnosis not present

## 2019-10-06 ENCOUNTER — Encounter: Payer: Self-pay | Admitting: Urology

## 2019-10-06 ENCOUNTER — Ambulatory Visit (INDEPENDENT_AMBULATORY_CARE_PROVIDER_SITE_OTHER): Payer: Medicare HMO | Admitting: Urology

## 2019-10-06 ENCOUNTER — Other Ambulatory Visit: Payer: Self-pay

## 2019-10-06 VITALS — BP 151/81 | HR 74 | Ht 61.0 in | Wt 140.0 lb

## 2019-10-06 DIAGNOSIS — N390 Urinary tract infection, site not specified: Secondary | ICD-10-CM

## 2019-10-06 DIAGNOSIS — N952 Postmenopausal atrophic vaginitis: Secondary | ICD-10-CM

## 2019-10-06 DIAGNOSIS — N281 Cyst of kidney, acquired: Secondary | ICD-10-CM

## 2019-10-06 LAB — URINALYSIS, COMPLETE
Bilirubin, UA: NEGATIVE
Glucose, UA: NEGATIVE
Ketones, UA: NEGATIVE
Nitrite, UA: POSITIVE — AB
Protein,UA: NEGATIVE
Specific Gravity, UA: 1.015 (ref 1.005–1.030)
Urobilinogen, Ur: 0.2 mg/dL (ref 0.2–1.0)
pH, UA: 6.5 (ref 5.0–7.5)

## 2019-10-06 LAB — MICROSCOPIC EXAMINATION: WBC, UA: >30 /hpf — AB (ref 0–5)

## 2019-10-06 NOTE — Progress Notes (Signed)
10/06/2019 2:41 PM   Lempi Falsetti Ebarb March 19, 1941 [id number]  Referring provider: [name], MD [address] [place],  [place] [address]  Chief Complaint  Patient presents with  . Follow-up  . Dysuria    HPI: Mrs. Annable is a 79 year old female with rUTI's, vaginal atrophy, complex renal cyst and a cystocele who presents today to review RUS results.    rUTI's Risk factors: age, vaginal atrophy, limited fluid intake and cystocele with moderate residuals.   She was placed on trimethoprim suppression in August 2020 and continues on suppressive antibiotic.   KUB 04/2019 Nonobstructed bowel gas pattern. No definite radiopaque urinary tract calculi identified.  RUS 10/03/2019 bilateral extrarenal pelves and/or parapelvic cysts, unchanged from prior.  Simple appearing cyst of the left kidney measuring 1.4 cm. No further follow-up is required.  Large postvoid residual, 239 mL.  She had the onset of dysuria and urgency two days prior to her office visit with [name] today.  She had some loose bowels the morning of her RUS.  Patient denies any modifying or aggravating factors.  Patient denies any gross hematuria or suprapubic/flank pain.  Patient denies any fevers, chills, nausea or vomiting.   CATH UA yellow cloudy, specific gravity 1.015, 2+ blood, pH 6.5, nitrate positive, 2+ leukocytes on dip and greater than 30 WBCs, 3-10 RBCs, 0-10 epithelial cells and many bacteria on microscopic exam.  Vaginal atrophy Mammogram in 11/2018 was negative.   Using the vaginal estrogen cream three nights weekly   Complex renal cyst RUS 10/03/19 Bilateral extrarenal pelves and/or parapelvic cysts, unchanged from prior.   Simple appearing cyst of the left kidney measuring 1.4 cm. No further follow-up is required. Large postvoid residual, 239 mL.  Cystocele No longer using pessary.   PMH: Past Medical History:  Diagnosis Date  . Abnormal ECG 10/28/2017  . Anxiety   . Bradycardia  11/10/2017  . Cancer ([place])    skin ca  . Cystocele 09/13/2013   small recurrent, less than before 09/13/13 JVF   . Gilbert's syndrome   . Hearing loss 1975  . History of recurrent urinary tract infection 03/24/2016  . Hyperlipidemia 2000  . Hypertension 2000    Surgical History: Past Surgical History:  Procedure Laterality Date  . ABDOMINAL HYSTERECTOMY  2014   partial  . ANTERIOR AND POSTERIOR REPAIR N/A 05/02/2013   Procedure: ANTERIOR (CYSTOCELE) AND POSTERIOR REPAIR (RECTOCELE);  Surgeon: [name], MD;  Location: [facility];  Service: Gynecology;  Laterality: N/A;  . CATARACT EXTRACTION, BILATERAL    . COLONOSCOPY  08/28/2011   Procedure: COLONOSCOPY;  Surgeon: [name], MD;  Location: [facility];  Service: Endoscopy;  Laterality: N/A;  930  . EYE SURGERY Bilateral 02/13/2011   other 2013  . 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 TUBAL LIGATION    . VAGINAL HYSTERECTOMY N/A 05/02/2013   Procedure: HYSTERECTOMY VAGINAL;  Surgeon: [name], MD;  Location: [facility];  Service: Gynecology;  Laterality: N/A;    Home Medications:  Allergies as of 10/06/2019      Reactions   Ciprofloxacin    Tendinitis    Levaquin [levofloxacin] Other (See Comments)   insomnia   [name] Diarrhea   [name] Palpitations, Rash   Last reaction was in 1969      Medication List       Accurate as of October 06, 2019 11:59 PM. If you have any  questions, ask your nurse or doctor.        aspirin EC 81 MG tablet Take 81 mg by mouth at bedtime.   estradiol 0.1 MG/GM vaginal cream Commonly known as: ESTRACE VAGINAL Apply 0.5mg  (pea-sized amount)  just inside the vaginal introitus with a finger-tip on Monday, Wednesday and Friday nights.   hydrochlorothiazide 25 MG tablet Commonly known as: HYDRODIURIL TAKE 1 TABLET DAILY   metoprolol tartrate 25 MG tablet Commonly known as: LOPRESSOR TAKE ONE AND ONE-HALF TABLETS TWICE A DAY    ramipril 10 MG capsule Commonly known as: ALTACE Take 1 capsule (10 mg total) by mouth daily.   simvastatin 10 MG tablet Commonly known as: ZOCOR Take 1 tablet (10 mg total) by mouth daily.   trimethoprim 100 MG tablet Commonly known as: TRIMPEX Take 1 tablet (100 mg total) by mouth daily.   VITAMIN D HIGH POTENCY PO Take by mouth.       Allergies:  Allergies  Allergen Reactions  . Ciprofloxacin     Tendinitis   . Levaquin [Levofloxacin] Other (See Comments)    insomnia  . [name] Diarrhea  . [name] Palpitations and Rash    Last reaction was in 1969    Family History: Family History  Problem Relation Age of Onset  . Hypertension Mother   . Cancer Mother        stomach  . Healthy Father   . Healthy Brother   . Colon cancer Neg Hx   . Prostate cancer Neg Hx   . Kidney cancer Neg Hx   . Bladder Cancer Neg Hx   . Breast cancer Neg Hx     Social History:  reports that she has never smoked. She has never used smokeless tobacco. She reports current alcohol use. She reports that she does not use drugs.  ROS: For pertinent review of systems please refer to history of present illness  Physical Exam: BP (!) 151/81   Pulse 74   Ht 5\' 1"  (1.549 m)   Wt 140 lb (63.5 kg)   BMI 26.45 kg/m   Constitutional:  Well nourished. Alert and oriented, No acute distress. HEENT: [place] AT, mask in place.  Trachea midline, no masses. Cardiovascular: No clubbing, cyanosis, or edema. Respiratory: Normal respiratory effort, no increased work of breathing. Neurologic: Grossly intact, no focal deficits, moving all 4 extremities. Psychiatric: Normal mood and affect.   Laboratory Data: Lab Results  Component Value Date   WBC 4.1 10/24/2018   HGB 14.3 10/24/2018   HCT 41.0 10/24/2018   MCV 90 10/24/2018   PLT 209 10/24/2018    Lab Results  Component Value Date   CREATININE 0.95 10/24/2018    Lab Results  Component Value Date   HGBA1C 5.6  07/23/2011    Lab Results  Component Value Date   TSH 3.270 10/24/2018       Component Value Date/Time   CHOL 173 10/24/2018 1009   HDL 66 10/24/2018 1009   CHOLHDL 2.6 10/24/2018 1009   CHOLHDL 2.1 09/25/2015 0822   VLDL 13 09/25/2015 0822   LDLCALC 93 10/24/2018 1009    Lab Results  Component Value Date   AST 18 10/24/2018   Lab Results  Component Value Date   ALT 20 10/24/2018    Urinalysis Component     Latest Ref Rng & Units 10/06/2019  Specific Gravity, UA     1.005 - 1.030 1.015  pH, UA     5.0 - 7.5 6.5  Color,  UA     Yellow Yellow  Appearance Ur     Clear Cloudy (A)  Leukocytes,UA     Negative 2+ (A)  Protein,UA     Negative/Trace Negative  Glucose, UA     Negative Negative  Ketones, UA     Negative Negative  RBC, UA     Negative 2+ (A)  Bilirubin, UA     Negative Negative  Urobilinogen, Ur     0.2 - 1.0 mg/dL 0.2  Nitrite, UA     Negative Positive (A)  Microscopic Examination      See below:   Component     Latest Ref Rng & Units 10/06/2019          WBC, UA     0 - 5 /hpf >30 (A)  RBC     0 - 2 /hpf 3-10 (A)  Epithelial Cells (non renal)     0 - 10 /hpf 0-10  Bacteria, UA     None seen/Few Many (A)   I have reviewed the labs.   Pertinent Imaging: CLINICAL DATA:  Complex cyst, recurrent UTIs  EXAM: RENAL / URINARY TRACT ULTRASOUND COMPLETE  COMPARISON:  09/29/2018  FINDINGS: Right Kidney:  Renal measurements: 8.3 x 4.6 x 5.0 cm = volume: 100 mL . Echogenicity within normal limits. Extrarenal pelvis and/or parapelvic cysts. No mass or hydronephrosis visualized.  Left Kidney:  Renal measurements: 10.5 x 4.9 x 5.2 cm = volume: 140 mL. Echogenicity within normal limits. Extrarenal pelvis and/or parapelvic cysts. Simple appearing cyst measuring 1.4 cm. No mass or hydronephrosis visualized.  Bladder:  Appears normal for degree of bladder distention. Postvoid residual 239  mL.  Other:  None.  IMPRESSION: 1. Bilateral extrarenal pelves and/or parapelvic cysts, unchanged from prior.  2. Simple appearing cyst of the left kidney measuring 1.4 cm. No further follow-up is required.  3.  Large postvoid residual, 239 mL.   Electronically Signed   By: Eddie Candle M.D.   On: 10/03/2019 17:00 I have independently reviewed the films and appreciate the bilateral parapelvic cysts are noted as central sinus cysts on contrast CT 03/2018 and the simple appearance of the cyst in question on the left.    Assessment & Plan:    1. rUTI's CATH UA nitrite positive with pyuria and hematuria Urine sent for culture, will start Keflex empirically and adjust if necessary once urine culture and sensitivities have returned Discontinue trimethoprim at this time  2. Vaginal atrophy Continue the vaginal estrogen cream 3 nights weekly  3. Left complex renal cyst Simple appearance on recent RUS No further surveillance is warranted   Return for pending urine culture results .  These notes generated with voice recognition software. I apologize for typographical errors.  Zara Council, PA-C  [facility] [address]  [place], [place] [address] [phone]

## 2019-10-06 NOTE — Progress Notes (Signed)
In and Out Catheterization  Patient is present today for a I & O catheterization due to  DysuriaPatient was cleaned and prepped in a sterile fashion with betadine . A 14FR cath was inserted no complications were noted , 319ml of urine return was noted, urine was yellow in color. A clean urine sample was collected for dysuria. Bladder was drained  And catheter was removed with out difficulty.    Preformed [id number] Corra Kaine CMA and Elberta Leatherwood CMA

## 2019-10-09 LAB — CULTURE, URINE COMPREHENSIVE

## 2019-10-10 ENCOUNTER — Telehealth: Payer: Self-pay | Admitting: Family Medicine

## 2019-10-10 MED ORDER — CEPHALEXIN 500 MG PO CAPS: 500.0000 mg | ORAL_CAPSULE | Freq: Two times a day (BID) | ORAL | 0 refills | Status: DC

## 2019-10-10 NOTE — Telephone Encounter (Signed)
-----   Message from Nori Riis, PA-C sent at 10/10/2019  8:06 AM EDT ----- Please let Mrs. Warf know that her urine culture was positive and we to send in Keflex 500 mg BID x 7 days.

## 2019-10-10 NOTE — Telephone Encounter (Signed)
Patient notified ABX sent to pharmacy. Patient voiced understanding.

## 2019-10-12 ENCOUNTER — Telehealth: Payer: Self-pay | Admitting: Urology

## 2019-10-12 ENCOUNTER — Other Ambulatory Visit: Payer: Self-pay | Admitting: Family Medicine

## 2019-10-12 DIAGNOSIS — R3129 Other microscopic hematuria: Secondary | ICD-10-CM

## 2019-10-12 NOTE — Telephone Encounter (Signed)
Patient notified and will go to [facility] the end of May on a Monday. She will contact our office to let us know when she has done the urine so we can be looking for the result.

## 2019-10-12 NOTE — Telephone Encounter (Signed)
Please let Mrs. Cotten know that we need to recheck her urine for micro heme in one month.

## 2019-10-17 ENCOUNTER — Other Ambulatory Visit: Payer: Self-pay

## 2019-10-17 ENCOUNTER — Encounter: Payer: Self-pay | Admitting: Urology

## 2019-10-17 ENCOUNTER — Ambulatory Visit (INDEPENDENT_AMBULATORY_CARE_PROVIDER_SITE_OTHER): Payer: Medicare HMO | Admitting: Urology

## 2019-10-17 VITALS — BP 156/78 | HR 69 | Ht 61.0 in | Wt 140.0 lb

## 2019-10-17 DIAGNOSIS — N390 Urinary tract infection, site not specified: Secondary | ICD-10-CM

## 2019-10-17 DIAGNOSIS — N8111 Cystocele, midline: Secondary | ICD-10-CM

## 2019-10-17 NOTE — Progress Notes (Signed)
In and Out Catheterization  Patient is present today for a I & O catheterization due to recurrent UTI. Patient was cleaned and prepped in a sterile fashion with betadine . A 14FR cath was inserted no complications were noted , 489ml of urine return was noted, urine was light yellow in color. A clean urine sample was collected for UA. Bladder was drained  And catheter was removed with out difficulty.    Preformed by: Elberta Leatherwood, CMA

## 2019-10-18 ENCOUNTER — Ambulatory Visit (INDEPENDENT_AMBULATORY_CARE_PROVIDER_SITE_OTHER): Payer: Medicare HMO

## 2019-10-18 VITALS — Ht 61.0 in | Wt 140.0 lb

## 2019-10-18 DIAGNOSIS — Z0000 Encounter for general adult medical examination without abnormal findings: Secondary | ICD-10-CM | POA: Diagnosis not present

## 2019-10-18 DIAGNOSIS — Z1231 Encounter for screening mammogram for malignant neoplasm of breast: Secondary | ICD-10-CM | POA: Diagnosis not present

## 2019-10-18 LAB — URINALYSIS, COMPLETE
Bilirubin, UA: NEGATIVE
Glucose, UA: NEGATIVE
Ketones, UA: NEGATIVE
Nitrite, UA: POSITIVE — AB
Protein,UA: NEGATIVE
Specific Gravity, UA: 1.01 (ref 1.005–1.030)
Urobilinogen, Ur: 0.2 mg/dL (ref 0.2–1.0)
pH, UA: 6 (ref 5.0–7.5)

## 2019-10-18 LAB — MICROSCOPIC EXAMINATION: WBC, UA: >30 /hpf — AB (ref 0–5)

## 2019-10-18 NOTE — Progress Notes (Addendum)
Subjective:   Rebecca Gutierrez is a 79 y.o. female who presents for an Initial Medicare Annual Wellness Visit.  Virtual Visit via Telephone Note  I connected with  Rashia Sheff Baskins on 10/18/19 at  1:20 PM EDT by telephone and verified that I am speaking with the correct person using two identifiers.  Medicare Annual Wellness visit completed telephonically due to Covid-19 pandemic.   Location: Patient: home Provider: office   I discussed the limitations, risks, security and privacy concerns of performing an evaluation and management service by telephone and the availability of in person appointments. The patient expressed understanding and agreed to proceed.  Unable to perform video visit due to patient does not have video capability.   Some vital signs may be absent or patient reported.   [name], LPN   Review of Systems      Cardiac Risk Factors include: advanced age (>30men, >81 women);hypertension;dyslipidemia     Objective:    Today's Vitals   10/18/19 1324  Weight: 140 lb (63.5 kg)  Height: 5\' 1"  (1.549 m)   Body mass index is 26.45 kg/m.  Advanced Directives 10/18/2019 10/10/2018 10/04/2017 09/29/2016 02/10/2016 05/02/2013 04/26/2013  Does Patient Have a Medical Advance Directive? Yes Yes Yes Yes Yes Patient has advance directive, copy not in chart Patient has advance directive, copy not in chart  Type of Advance Directive [place];Living will Living will;Healthcare Power of [place];Living will Healthcare Power of Attorney Living will;Healthcare Power of [place];Living will [place];Living will  Does patient want to make changes to medical advance directive? - - - - - No No  Copy of Healthcare Power of Attorney in Chart? No - copy requested No - copy requested No - copy requested No - copy requested - Copy requested from family Copy requested from family    Pre-existing out of facility DNR order (yellow form or pink MOST form) - - - - - No No    Current Medications (verified) Outpatient Encounter Medications as of 10/18/2019  Medication Sig  . aspirin EC 81 MG tablet Take 81 mg by mouth at bedtime.   . Cholecalciferol (VITAMIN D HIGH POTENCY PO) Take by mouth.  . estradiol (ESTRACE VAGINAL) 0.1 MG/GM vaginal cream Apply 0.5mg  (pea-sized amount)  just inside the vaginal introitus with a finger-tip on Monday, Wednesday and Friday nights.  . hydrochlorothiazide (HYDRODIURIL) 25 MG tablet TAKE 1 TABLET DAILY  . metoprolol tartrate (LOPRESSOR) 25 MG tablet TAKE ONE AND ONE-HALF TABLETS TWICE A DAY  . ramipril (ALTACE) 10 MG capsule Take 1 capsule (10 mg total) by mouth daily.  . simvastatin (ZOCOR) 10 MG tablet Take 1 tablet (10 mg total) by mouth daily.  Marland Kitchen trimethoprim (TRIMPEX) 100 MG tablet Take 1 tablet (100 mg total) by mouth daily.  . cephALEXin (KEFLEX) 500 MG capsule Take 1 capsule (500 mg total) by mouth 2 (two) times daily. (Patient not taking: Reported on 10/17/2019)   No facility-administered encounter medications on file as of 10/18/2019.    Allergies (verified) Ciprofloxacin, Levaquin [levofloxacin], [name], and [name]   History: Past Medical History:  Diagnosis Date  . Abnormal ECG 10/28/2017  . Anxiety   . Bradycardia 11/10/2017  . Cancer ([place])    skin ca  . Cystocele 09/13/2013   small recurrent, less than before 09/13/13 JVF   . Gilbert's syndrome   . Hearing loss 1975  . History of recurrent urinary tract infection  03/24/2016  . Hyperlipidemia 2000  . Hypertension 2000   Past Surgical History:  Procedure Laterality Date  . ABDOMINAL HYSTERECTOMY  2014   partial  . ANTERIOR AND POSTERIOR REPAIR N/A 05/02/2013   Procedure: ANTERIOR (CYSTOCELE) AND POSTERIOR REPAIR (RECTOCELE);  Surgeon: [name], MD;  Location: [facility];  Service: Gynecology;  Laterality: N/A;  . CATARACT EXTRACTION,  BILATERAL    . COLONOSCOPY  08/28/2011   Procedure: COLONOSCOPY;  Surgeon: [name], MD;  [facility];  Service: Endoscopy;  Laterality: N/A;  930  . EYE SURGERY Bilateral 02/13/2011   other 2013  . 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 TUBAL LIGATION    . VAGINAL HYSTERECTOMY N/A 05/02/2013   Procedure: HYSTERECTOMY VAGINAL;  Surgeon: [name], MD;  Location: [facility];  Service: Gynecology;  Laterality: N/A;   Family History  Problem Relation Age of Onset  . Hypertension Mother   . Cancer Mother        stomach  . Healthy Father   . Healthy Brother   . Colon cancer Neg Hx   . Prostate cancer Neg Hx   . Kidney cancer Neg Hx   . Bladder Cancer Neg Hx   . Breast cancer Neg Hx    Social History   Socioeconomic History  . Marital status: Widowed    Spouse name: Not on file  . Number of children: 2  . Years of education: Not on file  . Highest education level: 12th grade  Occupational History  . Occupation: Retired  Tobacco Use  . Smoking status: Never Smoker  . Smokeless tobacco: Never Used  . Tobacco comment: smoking cessation materials not required  Substance and Sexual Activity  . Alcohol use: Yes    Comment: one drink per week  . Drug use: No  . Sexual activity: Never    Birth control/protection: Post-menopausal, Surgical  Other Topics Concern  . Not on file  Social History Narrative  . Not on file   Social Determinants of Health   Financial Resource Strain: Low Risk   . Difficulty of Paying Living Expenses: Not hard at all  Food Insecurity: No Food Insecurity  . Worried About Charity fundraiser in the Last Year: Never true  . Ran Out of Food in the Last Year: Never true  Transportation Needs: No Transportation Needs  . Lack of Transportation (Medical): No  . Lack of Transportation (Non-Medical): No  Physical Activity: Insufficiently Active  . Days of Exercise per Week: 3 days  . Minutes of Exercise per Session:  30 min  Stress: No Stress Concern Present  . Feeling of Stress : Not at all  Social Connections: Moderately Isolated  . Frequency of Communication with Friends and Family: More than three times a week  . Frequency of Social Gatherings with Friends and Family: Three times a week  . Attends Religious Services: Never  . Active Member of Clubs or Organizations: No  . Attends Archivist Meetings: Never  . Marital Status: Widowed    Tobacco Counseling Counseling given: Not Answered Comment: smoking cessation materials not required   Clinical Intake:  Pre-visit preparation completed: Yes  Pain : No/denies pain     BMI - recorded: 26.45 Nutritional Status: BMI 25 -29 Overweight Nutritional Risks: None Diabetes: No  How often do you need to have someone help you when you read instructions, pamphlets, or other written materials from your doctor  or pharmacy?: 1 - Never  Interpreter Needed?: No  Information entered by :: [name] LPN   Activities of Daily Living In your present state of health, do you have any difficulty performing the following activities: 10/18/2019  Hearing? Y  Comment wears hearing aids  Vision? N  Difficulty concentrating or making decisions? N  Walking or climbing stairs? N  Dressing or bathing? N  Doing errands, shopping? N  Preparing Food and eating ? N  Using the Toilet? N  In the past six months, have you accidently leaked urine? N  Do you have problems with loss of bowel control? N  Managing your Medications? N  Managing your Finances? N  Housekeeping or managing your Housekeeping? N  Some recent data might be hidden     Immunizations and Health Maintenance Immunization History  Administered Date(s) Administered  . Fluad Quad(high Dose 65+) 04/18/2019  . Influenza Split 04/01/2015  . Influenza Whole 03/25/2006, 05/25/2011  . Influenza, High Dose Seasonal PF 03/31/2017, 04/22/2018  . Influenza,inj,Quad PF,6+ Mos 03/01/2013,  03/14/2014, 03/24/2016  . PFIZER SARS-COV-2 Vaccination 06/29/2019, 07/20/2019  . Pneumococcal Conjugate-13 09/17/2014  . Pneumococcal Polysaccharide-23 09/23/2009  . Td 02/04/2004, 03/14/2014  . Zoster 07/17/2010  . Zoster Recombinat (Shingrix) 01/09/2019, 05/18/2019   There are no preventive care reminders to display for this patient.  Patient Care Team: [name], MD as PCP - General (Internal Medicine) Jannet Mantis, MD (Dermatology) Hollice Espy, MD as Consulting Physician (Urology) Gae Dry, MD as Referring Physician (Obstetrics and Gynecology) Corey Skains, MD as Consulting Physician (Cardiology)  Indicate any recent Medical Services you may have received from other than [facility] providers in the past year (date may be approximate).     Assessment:   This is a routine wellness examination for [place].  Hearing/Vision screen  Hearing Screening   125Hz  250Hz  500Hz  1000Hz  2000Hz  3000Hz  4000Hz  6000Hz  8000Hz   Right ear:           Left ear:           Comments: Pt wears hearing aids maintained by [employer] through [facility]  Vision Screening Comments: Annual vision screenings done by [facility]  Dietary issues and exercise activities discussed: Current Exercise Habits: Home exercise routine, Type of exercise: walking, Time (Minutes): 30, Frequency (Times/Week): 3, Weekly Exercise (Minutes/Week): 90, Intensity: Mild, Exercise limited by: None identified  Goals    . DIET - INCREASE WATER INTAKE     Recommend to drink at least 6-8 8oz glasses of water per day.      Depression Screen PHQ 2/9 Scores 10/18/2019 10/02/2019 04/18/2019 10/24/2018 10/10/2018 10/04/2017 09/29/2016  PHQ - 2 Score 0 0 0 0 0 0 0  PHQ- 9 Score - 0 - - - 0 -    Fall Risk Fall Risk  10/18/2019 10/02/2019 04/18/2019 10/24/2018 10/10/2018  Falls in the past year? 0 0 0 0 1  Comment - - - - -  Number falls in past yr: 0 0 0 0 1  Injury with Fall? 0 0 0 0 0  Risk for fall due to : No Fall Risks  No Fall Risks - Impaired balance/gait -  Risk for fall due to: Comment - - - - -  Follow up Falls prevention discussed Falls evaluation completed Falls evaluation completed Falls evaluation completed Falls prevention discussed    FALL RISK PREVENTION PERTAINING TO THE HOME:  Any stairs in or around the home? No  If so, do they handrails? No  Home free of loose throw rugs in walkways, pet beds, electrical cords, etc? Yes  Adequate lighting in your home to reduce risk of falls? Yes   ASSISTIVE DEVICES UTILIZED TO PREVENT FALLS:  Life alert? No  Use of a cane, walker or w/c? No  Grab bars in the bathroom? Yes  Shower chair or bench in shower? Yes  Elevated toilet seat or a handicapped toilet? No   DME ORDERS:  DME order needed?  No   TIMED UP AND GO:  Was the test performed? No . Telephonic visit  Education: Fall risk prevention has been discussed.  Intervention(s) required? No    Cognitive Function: 6CIT deferred for 2021 AWV. Pt has no memory issues.      6CIT Screen 10/10/2018 10/04/2017 09/29/2016  What Year? 0 points 0 points 0 points  What month? 0 points 0 points 0 points  What time? 0 points 0 points 0 points  Count back from 20 0 points 0 points 0 points  Months in reverse 0 points 0 points 0 points  Repeat phrase 0 points 0 points 0 points  Total Score 0 0 0    Screening Tests Health Maintenance  Topic Date Due  . MAMMOGRAM  12/13/2019  . INFLUENZA VACCINE  01/14/2020  . TETANUS/TDAP  03/14/2024  . DEXA SCAN  Completed  . COVID-19 Vaccine  Completed  . PNA vac Low Risk Adult  Completed    Qualifies for Shingles Vaccine? Shingrix series completed.   Tdap: Up to date  Flu Vaccine: Up to date  Pneumococcal Vaccine: Up to date  Covid-19 Vaccine: Up to date   Cancer Screenings:  Colorectal Screening: Completed 2013. No longer required.   Mammogram: Completed 12/13/18. Repeat every year. Ordered today. Pt provided with contact information and  advised to call to schedule appt.   Bone Density: Completed 12/13/18. Results reflect  OSTEOPENIA. Repeat every 2 years.   Lung Cancer Screening: (Low Dose CT Chest recommended if Age 72-80 years, 30 pack-year currently smoking OR have quit w/in 15years.) does not qualify.   Additional Screening:  Hepatitis C Screening: no longer required  Vision Screening: Recommended annual ophthalmology exams for early detection of glaucoma and other disorders of the eye. Is the patient up to date with their annual eye exam?  Yes  Who is the provider or what is the name of the office in which the pt attends annual eye exams? [facility]  Dental Screening: Recommended annual dental exams for proper oral hygiene  Community Resource Referral:  CRR required this visit?  No       Plan:    I have personally reviewed and addressed the Medicare Annual Wellness questionnaire and have noted the following in the patient's chart:  A. Medical and social history B. Use of alcohol, tobacco or illicit drugs  C. Current medications and supplements D. Functional ability and status E.  Nutritional status F.  Physical activity G. Advance directives H. List of other physicians I.  Hospitalizations, surgeries, and ER visits in previous 12 months J.  [place] such as hearing and vision if needed, cognitive and depression L. Referrals and appointments   In addition, I have reviewed and discussed with patient certain preventive protocols, quality metrics, and best practice recommendations. A written personalized care plan for preventive services as well as general preventive health recommendations were provided to patient.   Signed,  [name], LPN Nurse Health Advisor   Nurse Notes: pt c/o frequent bladder infections, has appt  to see urology later this month.

## 2019-10-18 NOTE — Patient Instructions (Signed)
Rebecca Gutierrez , Thank you for taking time to come for your Medicare Wellness Visit. I appreciate your ongoing commitment to your health goals. Please review the following plan we discussed and let me know if I can assist you in the future.   Screening recommendations/referrals: Colonoscopy: no longer required.  Mammogram: done 12/13/18. Please call [phone] to schedule your mammogram Bone Density: done 12/13/18 Recommended yearly ophthalmology/optometry visit for glaucoma screening and checkup Recommended yearly dental visit for hygiene and checkup  Vaccinations: Influenza vaccine: done 04/18/19 Pneumococcal vaccine: done 09/17/14 Tdap vaccine: done 03/14/14 Shingles vaccine: done 01/09/19 & 05/18/19   Covid-19: done 06/29/19 & 07/20/19  Advanced directives: Please bring a copy of your health care power of attorney and living will to the office at your convenience.  Conditions/risks identified: Keep up the great work!  Next appointment: Please follow up in one year for your Medicare Annual Wellness visit.     Preventive Care 97 Years and Older, Female Preventive care refers to lifestyle choices and visits with your health care provider that can promote health and wellness. What does preventive care include?  A yearly physical exam. This is also called an annual well check.  Dental exams once or twice a year.  Routine eye exams. Ask your health care provider how often you should have your eyes checked.  Personal lifestyle choices, including:  Daily care of your teeth and gums.  Regular physical activity.  Eating a healthy diet.  Avoiding tobacco and drug use.  Limiting alcohol use.  Practicing safe sex.  Taking low-dose aspirin every day.  Taking vitamin and mineral supplements as recommended by your health care provider. What happens during an annual well check? The services and screenings done by your health care provider during your annual well check will depend on your  age, overall health, lifestyle risk factors, and family history of disease. Counseling  Your health care provider may ask you questions about your:  Alcohol use.  Tobacco use.  Drug use.  Emotional well-being.  Home and relationship well-being.  Sexual activity.  Eating habits.  History of falls.  Memory and ability to understand (cognition).  Work and work Statistician.  Reproductive health. Screening  You may have the following tests or measurements:  Height, weight, and BMI.  Blood pressure.  Lipid and cholesterol levels. These may be checked every 5 years, or more frequently if you are over 79 years old.  Skin check.  Lung cancer screening. You may have this screening every year starting at age 22 if you have a 30-pack-year history of smoking and currently smoke or have quit within the past 15 years.  Fecal occult blood test (FOBT) of the stool. You may have this test every year starting at age 61.  Flexible sigmoidoscopy or colonoscopy. You may have a sigmoidoscopy every 5 years or a colonoscopy every 10 years starting at age 42.  Hepatitis C blood test.  Hepatitis B blood test.  Sexually transmitted disease (STD) testing.  Diabetes screening. This is done by checking your blood sugar (glucose) after you have not eaten for a while (fasting). You may have this done every 1-3 years.  Bone density scan. This is done to screen for osteoporosis. You may have this done starting at age 5.  Mammogram. This may be done every 1-2 years. Talk to your health care provider about how often you should have regular mammograms. Talk with your health care provider about your test results, treatment options, and if necessary, the  need for more tests. Vaccines  Your health care provider may recommend certain vaccines, such as:  Influenza vaccine. This is recommended every year.  Tetanus, diphtheria, and acellular pertussis (Tdap, Td) vaccine. You may need a Td booster  every 10 years.  Zoster vaccine. You may need this after age 2.  Pneumococcal 13-valent conjugate (PCV13) vaccine. One dose is recommended after age 49.  Pneumococcal polysaccharide (PPSV23) vaccine. One dose is recommended after age 64. Talk to your health care provider about which screenings and vaccines you need and how often you need them. This information is not intended to replace advice given to you by your health care provider. Make sure you discuss any questions you have with your health care provider. Document Released: 06/28/2015 Document Revised: 02/19/2016 Document Reviewed: 04/02/2015 Elsevier Interactive Patient Education  2017 [place] Prevention in the Home Falls can cause injuries. They can happen to people of all ages. There are many things you can do to make your home safe and to help prevent falls. What can I do on the outside of my home?  Regularly fix the edges of walkways and driveways and fix any cracks.  Remove anything that might make you trip as you walk through a door, such as a raised step or threshold.  Trim any bushes or trees on the path to your home.  Use bright outdoor lighting.  Clear any walking paths of anything that might make someone trip, such as rocks or tools.  Regularly check to see if handrails are loose or broken. Make sure that both sides of any steps have handrails.  Any raised decks and porches should have guardrails on the edges.  Have any leaves, snow, or ice cleared regularly.  Use sand or salt on walking paths during winter.  Clean up any spills in your garage right away. This includes oil or grease spills. What can I do in the bathroom?  Use night lights.  Install grab bars by the toilet and in the tub and shower. Do not use towel bars as grab bars.  Use non-skid mats or decals in the tub or shower.  If you need to sit down in the shower, use a plastic, non-slip stool.  Keep the floor dry. Clean up any  water that spills on the floor as soon as it happens.  Remove soap buildup in the tub or shower regularly.  Attach bath mats securely with double-sided non-slip rug tape.  Do not have throw rugs and other things on the floor that can make you trip. What can I do in the bedroom?  Use night lights.  Make sure that you have a light by your bed that is easy to reach.  Do not use any sheets or blankets that are too big for your bed. They should not hang down onto the floor.  Have a firm chair that has side arms. You can use this for support while you get dressed.  Do not have throw rugs and other things on the floor that can make you trip. What can I do in the kitchen?  Clean up any spills right away.  Avoid walking on wet floors.  Keep items that you use a lot in easy-to-reach places.  If you need to reach something above you, use a strong step stool that has a grab bar.  Keep electrical cords out of the way.  Do not use floor polish or wax that makes floors slippery. If you must use wax,  use non-skid floor wax.  Do not have throw rugs and other things on the floor that can make you trip. What can I do with my stairs?  Do not leave any items on the stairs.  Make sure that there are handrails on both sides of the stairs and use them. Fix handrails that are broken or loose. Make sure that handrails are as long as the stairways.  Check any carpeting to make sure that it is firmly attached to the stairs. Fix any carpet that is loose or worn.  Avoid having throw rugs at the top or bottom of the stairs. If you do have throw rugs, attach them to the floor with carpet tape.  Make sure that you have a light switch at the top of the stairs and the bottom of the stairs. If you do not have them, ask someone to add them for you. What else can I do to help prevent falls?  Wear shoes that:  Do not have high heels.  Have rubber bottoms.  Are comfortable and fit you well.  Are closed  at the toe. Do not wear sandals.  If you use a stepladder:  Make sure that it is fully opened. Do not climb a closed stepladder.  Make sure that both sides of the stepladder are locked into place.  Ask someone to hold it for you, if possible.  Clearly mark and make sure that you can see:  Any grab bars or handrails.  First and last steps.  Where the edge of each step is.  Use tools that help you move around (mobility aids) if they are needed. These include:  Canes.  Walkers.  Scooters.  Crutches.  Turn on the lights when you go into a dark area. Replace any light bulbs as soon as they burn out.  Set up your furniture so you have a clear path. Avoid moving your furniture around.  If any of your floors are uneven, fix them.  If there are any pets around you, be aware of where they are.  Review your medicines with your doctor. Some medicines can make you feel dizzy. This can increase your chance of falling. Ask your doctor what other things that you can do to help prevent falls. This information is not intended to replace advice given to you by your health care provider. Make sure you discuss any questions you have with your health care provider. Document Released: 03/28/2009 Document Revised: 11/07/2015 Document Reviewed: 07/06/2014 Elsevier Interactive Patient Education  2017 [employer].

## 2019-10-19 LAB — CULTURE, URINE COMPREHENSIVE

## 2019-10-20 ENCOUNTER — Telehealth: Payer: Self-pay | Admitting: Family Medicine

## 2019-10-20 MED ORDER — NITROFURANTOIN MONOHYD MACRO 100 MG PO CAPS: 100.0000 mg | ORAL_CAPSULE | Freq: Two times a day (BID) | ORAL | 0 refills | Status: DC

## 2019-10-20 NOTE — Telephone Encounter (Signed)
Patient notified and ABX was sent to pharmacy. 

## 2019-10-20 NOTE — Telephone Encounter (Signed)
-----   Message from Nori Riis, PA-C sent at 10/19/2019  5:25 PM EDT ----- Please let Mrs. Mcmurry know that her urine culture was positive for infection and she needs to start Macrobid 100 mg, BID x 7 days.

## 2019-10-24 NOTE — Progress Notes (Signed)
10/17/2019 8:12 PM   Rebecca Gutierrez 1940-08-26 [id number]  Referring provider: [name], MD [address] [place],  [place] [address]  Chief Complaint  Patient presents with  . Recurrent UTI    HPI: [name] is a 79 year old female with rUTI's, vaginal atrophy, complex renal cyst and a cystocele who presents today stating her UTI has returned.    rUTI's Risk factors: age, vaginal atrophy, limited fluid intake and cystocele with moderate to high residuals.   She was placed on trimethoprim suppression in August 2020 and continues on suppressive antibiotic.   KUB 04/2019 Nonobstructed bowel gas pattern. No definite radiopaque urinary tract calculi identified.  RUS 10/03/2019 bilateral extrarenal pelves and/or parapelvic cysts, unchanged from prior.  Simple appearing cyst of the left kidney measuring 1.4 cm. No further follow-up is required.   She had completed a course of Keflex for an UTI positive for E. Coli on 10/16/2019, but she still feels like she has an infection.     Today, she states that she continues to have frequency, urgency and dysuria.  Patient denies any modifying or aggravating factors.  Patient denies any gross hematuria or suprapubic/flank pain.  Patient denies any fevers, chills, nausea or vomiting.   Her CATH UA yellow-cloudy, specific gravity 1.010, 1+ blood, pH 6.0, nitrite positive, 3+ leukocytes on dip with > 30 WBC's, 3-10 RBC's, 0-10 epithelial cells and many bacteria.  Her PVR is 470 mL.     Vaginal atrophy Mammogram in 11/2018 was negative.   Using the vaginal estrogen cream three nights weekly   Complex renal cyst RUS 10/03/19 Bilateral extrarenal pelves and/or parapelvic cysts, unchanged from prior.   Simple appearing cyst of the left kidney measuring 1.4 cm.  No further follow-up is required. Large postvoid residual, 239 mL.  Cystocele No longer using pessary.   PMH: Past Medical History:  Diagnosis Date  . Abnormal ECG  10/28/2017  . Anxiety   . Bradycardia 11/10/2017  . Cancer ([place])    skin ca  . Cystocele 09/13/2013   small recurrent, less than before 09/13/13 JVF   . Gilbert's syndrome   . Hearing loss 1975  . History of recurrent urinary tract infection 03/24/2016  . Hyperlipidemia 2000  . Hypertension 2000    Surgical History: Past Surgical History:  Procedure Laterality Date  . ABDOMINAL HYSTERECTOMY  2014   partial  . ANTERIOR AND POSTERIOR REPAIR N/A 05/02/2013   Procedure: ANTERIOR (CYSTOCELE) AND POSTERIOR REPAIR (RECTOCELE);  Surgeon: [name], MD;  Location: [facility];  Service: Gynecology;  Laterality: N/A;  . CATARACT EXTRACTION, BILATERAL    . COLONOSCOPY  08/28/2011   Procedure: COLONOSCOPY;  Surgeon: [name], MD;  Location: [facility];  Service: Endoscopy;  Laterality: N/A;  930  . EYE SURGERY Bilateral 02/13/2011   other 2013  . 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 TUBAL LIGATION    . VAGINAL HYSTERECTOMY N/A 05/02/2013   Procedure: HYSTERECTOMY VAGINAL;  Surgeon: [name], MD;  Location: [facility];  Service: Gynecology;  Laterality: N/A;    Home Medications:  Allergies as of 10/17/2019      Reactions   Ciprofloxacin    Tendinitis    Levaquin [levofloxacin] Other (See Comments)   insomnia   [name] Diarrhea   [name] Palpitations, Rash   Last reaction was in 1969      Medication List  Accurate as of Oct 17, 2019 11:59 PM. If you have any questions, ask your nurse or doctor.        aspirin EC 81 MG tablet Take 81 mg by mouth at bedtime.   cephALEXin 500 MG capsule Commonly known as: Keflex Take 1 capsule (500 mg total) by mouth 2 (two) times daily.   estradiol 0.1 MG/GM vaginal cream Commonly known as: ESTRACE VAGINAL Apply 0.5mg  (pea-sized amount)  just inside the vaginal introitus with a finger-tip on Monday, Wednesday and Friday nights.   hydrochlorothiazide 25 MG tablet Commonly  known as: HYDRODIURIL TAKE 1 TABLET DAILY   metoprolol tartrate 25 MG tablet Commonly known as: LOPRESSOR TAKE ONE AND ONE-HALF TABLETS TWICE A DAY   ramipril 10 MG capsule Commonly known as: ALTACE Take 1 capsule (10 mg total) by mouth daily.   simvastatin 10 MG tablet Commonly known as: ZOCOR Take 1 tablet (10 mg total) by mouth daily.   trimethoprim 100 MG tablet Commonly known as: TRIMPEX Take 1 tablet (100 mg total) by mouth daily.   VITAMIN D HIGH POTENCY PO Take by mouth.       Allergies:  Allergies  Allergen Reactions  . Ciprofloxacin     Tendinitis   . Levaquin [Levofloxacin] Other (See Comments)    insomnia  . [name] Diarrhea  . [name] Palpitations and Rash    Last reaction was in 1969    Family History: Family History  Problem Relation Age of Onset  . Hypertension Mother   . Cancer Mother        stomach  . Healthy Father   . Healthy Brother   . Colon cancer Neg Hx   . Prostate cancer Neg Hx   . Kidney cancer Neg Hx   . Bladder Cancer Neg Hx   . Breast cancer Neg Hx     Social History:  reports that she has never smoked. She has never used smokeless tobacco. She reports current alcohol use. She reports that she does not use drugs.  ROS: For pertinent review of systems please refer to history of present illness  Physical Exam: BP (!) 156/78   Pulse 69   Ht 5\' 1"  (1.549 m)   Wt 140 lb (63.5 kg)   BMI 26.45 kg/m   Constitutional:  Well nourished. Alert and oriented, No acute distress. HEENT: [place] AT, mask in place.  Trachea midline. Cardiovascular: No clubbing, cyanosis, or edema. Respiratory: Normal respiratory effort, no increased work of breathing. Neurologic: Grossly intact, no focal deficits, moving all 4 extremities. Psychiatric: Normal mood and affect.   Laboratory Data: Lab Results  Component Value Date   WBC 4.1 10/24/2018   HGB 14.3 10/24/2018   HCT 41.0 10/24/2018   MCV 90 10/24/2018   PLT  209 10/24/2018    Lab Results  Component Value Date   CREATININE 0.95 10/24/2018    Lab Results  Component Value Date   HGBA1C 5.6 07/23/2011    Lab Results  Component Value Date   TSH 3.270 10/24/2018       Component Value Date/Time   CHOL 173 10/24/2018 1009   HDL 66 10/24/2018 1009   CHOLHDL 2.6 10/24/2018 1009   CHOLHDL 2.1 09/25/2015 0822   VLDL 13 09/25/2015 0822   LDLCALC 93 10/24/2018 1009    Lab Results  Component Value Date   AST 18 10/24/2018   Lab Results  Component Value Date   ALT 20 10/24/2018    Urinalysis Component  Latest Ref Rng & Units 10/17/2019  Specific Gravity, UA     1.005 - 1.030 1.010  pH, UA     5.0 - 7.5 6.0  Color, UA     Yellow Yellow  Appearance Ur     Clear Cloudy (A)  Leukocytes,UA     Negative 3+ (A)  Protein,UA     Negative/Trace Negative  Glucose, UA     Negative Negative  Ketones, UA     Negative Negative  RBC, UA     Negative 1+ (A)  Bilirubin, UA     Negative Negative  Urobilinogen, Ur     0.2 - 1.0 mg/dL 0.2  Nitrite, UA     Negative Positive (A)  Microscopic Examination      See below:   Component     Latest Ref Rng & Units 10/17/2019          WBC, UA     0 - 5 /hpf >30 (A)  RBC     0 - 2 /hpf 3-10 (A)  Epithelial Cells (non renal)     0 - 10 /hpf 0-10  Bacteria, UA     None seen/Few Many (A)   I have reviewed the labs.   Pertinent Imaging: CLINICAL DATA:  Complex cyst, recurrent UTIs  EXAM: RENAL / URINARY TRACT ULTRASOUND COMPLETE  COMPARISON:  09/29/2018  FINDINGS: Right Kidney:  Renal measurements: 8.3 x 4.6 x 5.0 cm = volume: 100 mL . Echogenicity within normal limits. Extrarenal pelvis and/or parapelvic cysts. No mass or hydronephrosis visualized.  Left Kidney:  Renal measurements: 10.5 x 4.9 x 5.2 cm = volume: 140 mL. Echogenicity within normal limits. Extrarenal pelvis and/or parapelvic cysts. Simple appearing cyst measuring 1.4 cm. No mass or hydronephrosis  visualized.  Bladder:  Appears normal for degree of bladder distention. Postvoid residual 239 mL.  Other:  None.  IMPRESSION: 1. Bilateral extrarenal pelves and/or parapelvic cysts, unchanged from prior.  2. Simple appearing cyst of the left kidney measuring 1.4 cm. No further follow-up is required.  3.  Large postvoid residual, 239 mL.   Electronically Signed   By: Eddie Candle M.D.   On: 10/03/2019 17:00   Assessment & Plan:    1. rUTI's CATH UA nitrite positive with pyuria and hematuria Urine sent for culture will hold on prescribing antibiotic until culture is available Discussed with the patient that her large residuals are likely contributing to her recurrent urinary tract infections I encouraged her to reconsider wearing a pessary, but she declined stating that her previous pessary did not fit her and kept falling out I offered her a referral back to gynecology for another fitting, but she deferred I also offered to instruct her on self-catheterization to assist in emptying her bladder, but she deferred this as well She would like to have another appointment with Dr. Matilde Sprang for second opinion  2. Cystocele See above  3. Vaginal atrophy Continue the vaginal estrogen cream 3 nights weekly  4. Left complex renal cyst Simple appearance on recent RUS No further surveillance is warranted   Return for appointment with Dr. Matilde Sprang .  These notes generated with voice recognition software. I apologize for typographical errors.  Zara Council, PA-C  [facility] [address]  [place], [place] [address] [phone]

## 2019-10-25 ENCOUNTER — Encounter: Payer: Self-pay | Admitting: Internal Medicine

## 2019-10-25 ENCOUNTER — Ambulatory Visit (INDEPENDENT_AMBULATORY_CARE_PROVIDER_SITE_OTHER): Payer: Medicare HMO | Admitting: Internal Medicine

## 2019-10-25 ENCOUNTER — Other Ambulatory Visit: Payer: Self-pay

## 2019-10-25 VITALS — BP 126/62 | HR 72 | Temp 96.9°F | Ht 61.0 in | Wt 142.0 lb

## 2019-10-25 DIAGNOSIS — C4490 Unspecified malignant neoplasm of skin, unspecified: Secondary | ICD-10-CM | POA: Insufficient documentation

## 2019-10-25 DIAGNOSIS — E785 Hyperlipidemia, unspecified: Secondary | ICD-10-CM

## 2019-10-25 DIAGNOSIS — I10 Essential (primary) hypertension: Secondary | ICD-10-CM

## 2019-10-25 DIAGNOSIS — N390 Urinary tract infection, site not specified: Secondary | ICD-10-CM | POA: Diagnosis not present

## 2019-10-25 DIAGNOSIS — Z0000 Encounter for general adult medical examination without abnormal findings: Secondary | ICD-10-CM | POA: Diagnosis not present

## 2019-10-25 DIAGNOSIS — Z1231 Encounter for screening mammogram for malignant neoplasm of breast: Secondary | ICD-10-CM | POA: Diagnosis not present

## 2019-10-25 NOTE — Progress Notes (Signed)
Date:  10/25/2019   Name:  Rebecca Gutierrez   DOB:  04-13-1941   MRN:  [id number]   Chief Complaint: Annual Exam (Breast Exam. ) Rebecca Gutierrez is a 79 y.o. female who presents today for her Complete Annual Exam. She feels well. She reports exercising walking some. She reports she is sleeping fairly well. She denies breast issues.  Mammogram  12/2018 DEXA  12/2018 - osteopenia Colonoscopy aged out Immunization History  Administered Date(s) Administered  . Fluad Quad(high Dose 65+) 04/18/2019  . Influenza Split 04/01/2015  . Influenza Whole 03/25/2006, 05/25/2011  . Influenza, High Dose Seasonal PF 03/31/2017, 04/22/2018  . Influenza,inj,Quad PF,6+ Mos 03/01/2013, 03/14/2014, 03/24/2016  . PFIZER SARS-COV-2 Vaccination 06/29/2019, 07/20/2019  . Pneumococcal Conjugate-13 09/17/2014  . Pneumococcal Polysaccharide-23 09/23/2009  . Td 02/04/2004, 03/14/2014  . Zoster 07/17/2010  . Zoster Recombinat (Shingrix) 01/09/2019, 05/18/2019    Hypertension This is a chronic problem. The problem is controlled (at home). Pertinent negatives include no chest pain, headaches, palpitations or shortness of breath. Past treatments include diuretics, beta blockers and ACE inhibitors. The current treatment provides significant improvement.  Hyperlipidemia The problem is controlled. Pertinent negatives include no chest pain or shortness of breath. Current antihyperlipidemic treatment includes statins. The current treatment provides significant improvement of lipids.  Urinary Tract Infection  This is a recurrent problem. The quality of the pain is described as burning. The pain is mild. There has been no fever. Pertinent negatives include no chills, frequency or vomiting. She has tried antibiotics (under Urology care) for the symptoms.    Lab Results  Component Value Date   CREATININE 0.95 10/24/2018   BUN 22 10/24/2018   NA 138 10/24/2018   K 3.7 10/24/2018   CL 101 10/24/2018   CO2 23  10/24/2018   Lab Results  Component Value Date   CHOL 173 10/24/2018   HDL 66 10/24/2018   LDLCALC 93 10/24/2018   TRIG 71 10/24/2018   CHOLHDL 2.6 10/24/2018   Lab Results  Component Value Date   TSH 3.270 10/24/2018   Lab Results  Component Value Date   HGBA1C 5.6 07/23/2011   Lab Results  Component Value Date   WBC 4.1 10/24/2018   HGB 14.3 10/24/2018   HCT 41.0 10/24/2018   MCV 90 10/24/2018   PLT 209 10/24/2018   Lab Results  Component Value Date   ALT 20 10/24/2018   AST 18 10/24/2018   ALKPHOS 72 10/24/2018   BILITOT 0.9 10/24/2018     Review of Systems  Constitutional: Negative for chills, fatigue and fever.  HENT: Positive for hearing loss. Negative for congestion, tinnitus, trouble swallowing and voice change.   Eyes: Negative for visual disturbance.  Respiratory: Negative for cough, chest tightness, shortness of breath and wheezing.   Cardiovascular: Negative for chest pain, palpitations and leg swelling.  Gastrointestinal: Negative for abdominal pain, constipation, diarrhea and vomiting.  Endocrine: Negative for polydipsia and polyuria.  Genitourinary: Positive for dysuria. Negative for frequency, genital sores, vaginal bleeding and vaginal discharge.  Musculoskeletal: Negative for arthralgias, gait problem and joint swelling.  Skin: Positive for wound (forehead cancer removed). Negative for color change and rash.  Neurological: Negative for dizziness, tremors, light-headedness and headaches.  Hematological: Negative for adenopathy. Does not bruise/bleed easily.  Psychiatric/Behavioral: Negative for dysphoric mood and sleep disturbance. The patient is not nervous/anxious.     Patient Active Problem List   Diagnosis Date Noted  . Cystocele, midline 04/11/2018  . Moderate mitral insufficiency  11/10/2017  . 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 Recurrent UTI 03/10/2015  . OSTEOPENIA 06/28/2009  . Hyperlipidemia LDL goal <100 06/02/2007  . 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 Hearing loss 06/02/2007  . Essential hypertension, benign 06/02/2007  . Acute bilateral low back pain with bilateral sciatica 06/02/2007    Allergies  Allergen Reactions  . Ciprofloxacin     Tendinitis   . Levaquin [Levofloxacin] Other (See Comments)    insomnia  . [name] Diarrhea  . [name] Palpitations and Rash    Last reaction was in 1969    Past Surgical History:  Procedure Laterality Date  . ABDOMINAL HYSTERECTOMY  2014   partial  . ANTERIOR AND POSTERIOR REPAIR N/A 05/02/2013   Procedure: ANTERIOR (CYSTOCELE) AND POSTERIOR REPAIR (RECTOCELE);  Surgeon: [name], MD;  Location: [facility];  Service: Gynecology;  Laterality: N/A;  . CATARACT EXTRACTION, BILATERAL    . COLONOSCOPY  08/28/2011   Procedure: COLONOSCOPY;  Surgeon: [name], MD;  Location: [facility];  Service: Endoscopy;  Laterality: N/A;  930  . EYE SURGERY Bilateral 02/13/2011   other 2013  . 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 TUBAL LIGATION    . VAGINAL HYSTERECTOMY N/A 05/02/2013   Procedure: HYSTERECTOMY VAGINAL;  Surgeon: [name], MD;  Location: [facility];  Service: Gynecology;  Laterality: N/A;    Social History   Tobacco Use  . Smoking status: Never Smoker  . Smokeless tobacco: Never Used  . Tobacco comment: smoking cessation materials not required  Substance Use Topics  . Alcohol use: Yes    Comment: one drink per week  . Drug use: No     Medication list has been reviewed and updated.  Current Meds  Medication Sig  . aspirin EC 81 MG tablet Take 81 mg by mouth at bedtime.   . Cholecalciferol (VITAMIN D HIGH POTENCY PO) Take by mouth.  . estradiol (ESTRACE VAGINAL) 0.1 MG/GM vaginal cream Apply 0.5mg  (pea-sized amount)  just inside the vaginal introitus with a finger-tip on Monday, Wednesday and Friday nights.  . hydrochlorothiazide (HYDRODIURIL) 25 MG tablet TAKE 1 TABLET DAILY    . metoprolol tartrate (LOPRESSOR) 25 MG tablet TAKE ONE AND ONE-HALF TABLETS TWICE A DAY  . nitrofurantoin, macrocrystal-monohydrate, (MACROBID) 100 MG capsule Take 1 capsule (100 mg total) by mouth every 12 (twelve) hours.  . ramipril (ALTACE) 10 MG capsule Take 1 capsule (10 mg total) by mouth daily.  . simvastatin (ZOCOR) 10 MG tablet Take 1 tablet (10 mg total) by mouth daily.  Marland Kitchen trimethoprim (TRIMPEX) 100 MG tablet Take 1 tablet (100 mg total) by mouth daily.    PHQ 2/9 Scores 10/25/2019 10/18/2019 10/02/2019 04/18/2019  PHQ - 2 Score 0 0 0 0  PHQ- 9 Score 0 - 0 -   GAD 7 : Generalized Anxiety Score 10/25/2019 10/02/2019  Nervous, Anxious, on Edge 0 0  Control/stop worrying 0 0  Worry too much - different things 0 0  Trouble relaxing 0 0  Restless 0 0  Easily annoyed or irritable 0 0  Afraid - awful might happen 0 0  Total GAD 7 Score 0 0  Anxiety Difficulty Not difficult at all Not difficult at all      BP Readings from Last 3 Encounters:  10/25/19 126/62  10/17/19 (!) 156/78  10/06/19 (!) 151/81    Physical Exam Vitals and nursing note reviewed.  Constitutional:  General: She is not in acute distress.    Appearance: She is well-developed.  HENT:     Head: Normocephalic and atraumatic.     Right Ear: Tympanic membrane and ear canal normal.     Left Ear: Tympanic membrane and ear canal normal.     Nose:     Right Sinus: No maxillary sinus tenderness.     Left Sinus: No maxillary sinus tenderness.  Eyes:     General: No scleral icterus.       Right eye: No discharge.        Left eye: No discharge.     Conjunctiva/sclera: Conjunctivae normal.  Neck:     Thyroid: No thyromegaly.     Vascular: No carotid bruit.  Cardiovascular:     Rate and Rhythm: Normal rate and regular rhythm.     Pulses: Normal pulses.     Heart sounds: Normal heart sounds.  Pulmonary:     Effort: Pulmonary effort is normal. No respiratory distress.     Breath sounds: No wheezing.  Chest:      Breasts:        Right: No mass, nipple discharge, skin change or tenderness.        Left: No mass, nipple discharge, skin change or tenderness.  Abdominal:     General: Bowel sounds are normal.     Palpations: Abdomen is soft.     Tenderness: There is no abdominal tenderness.  Musculoskeletal:     Cervical back: Normal range of motion. No erythema.     Right lower leg: No edema.     Left lower leg: No edema.  Lymphadenopathy:     Cervical: No cervical adenopathy.  Skin:    General: Skin is warm and dry.     Capillary Refill: Capillary refill takes less than 2 seconds.     Findings: Wound (dressing on right forehead at bx site) present. No rash.  Neurological:     General: No focal deficit present.     Mental Status: She is alert and oriented to person, place, and time.     Cranial Nerves: No cranial nerve deficit.     Sensory: No sensory deficit.     Deep Tendon Reflexes: Reflexes are normal and symmetric.  Psychiatric:        Speech: Speech normal.        Behavior: Behavior normal.        Thought Content: Thought content normal.     Wt Readings from Last 3 Encounters:  10/25/19 142 lb (64.4 kg)  10/18/19 140 lb (63.5 kg)  10/17/19 140 lb (63.5 kg)    BP 126/62   Pulse 72   Temp (!) 96.9 F (36.1 C) (Temporal)   Ht 5\' 1"  (1.549 m)   Wt 142 lb (64.4 kg)   SpO2 96%   BMI 26.83 kg/m   Assessment and Plan: 1. Annual physical exam Normal exam Continue healthy diet  2. Encounter for screening mammogram for breast cancer To be scheduled in July at [facility]  3. Essential hypertension, benign Clinically stable exam with well controlled BP. Tolerating medications without side effects at this time. Pt to continue current regimen and low sodium diet; benefits of regular exercise as able discussed. - CBC with Differential/Platelet - Comprehensive metabolic panel - TSH  4. Hyperlipidemia LDL goal <100 Tolerating low potency statin medication without side effects at  this time Continue same therapy without change at this time. - Lipid panel  5. Non-melanoma skin  cancer Currently being evaluated by Dermatology  6. Recurrent UTI Currently on Macrobid for E Coli UTI Being monitored and treated by Urology Had a strange reaction to topical estrogen (shaking body without chills lasting an hour).   Partially dictated using Editor, commissioning. Any errors are unintentional.  Halina Maidens, MD [facility]  10/25/2019

## 2019-10-26 LAB — CBC WITH DIFFERENTIAL/PLATELET
Basophils Absolute: 0 10*3/uL (ref 0.0–0.2)
Basos: 1 %
EOS (ABSOLUTE): 0.1 10*3/uL (ref 0.0–0.4)
Eos: 2 %
Hematocrit: 40.4 % (ref 34.0–46.6)
Hemoglobin: 13.8 g/dL (ref 11.1–15.9)
Immature Grans (Abs): 0 10*3/uL (ref 0.0–0.1)
Immature Granulocytes: 1 %
Lymphocytes Absolute: 0.6 10*3/uL — ABNORMAL LOW (ref 0.7–3.1)
Lymphs: 13 %
MCH: 30.4 pg (ref 26.6–33.0)
MCHC: 34.2 g/dL (ref 31.5–35.7)
MCV: 89 fL (ref 79–97)
Monocytes Absolute: 0.3 10*3/uL (ref 0.1–0.9)
Monocytes: 7 %
Neutrophils Absolute: 3.4 10*3/uL (ref 1.4–7.0)
Neutrophils: 76 %
Platelets: 236 10*3/uL (ref 150–450)
RBC: 4.54 x10E6/uL (ref 3.77–5.28)
RDW: 13.2 % (ref 11.7–15.4)
WBC: 4.4 10*3/uL (ref 3.4–10.8)

## 2019-10-26 LAB — COMPREHENSIVE METABOLIC PANEL
ALT: 19 IU/L (ref 0–32)
AST: 20 IU/L (ref 0–40)
Albumin/Globulin Ratio: 1.9 (ref 1.2–2.2)
Albumin: 4.4 g/dL (ref 3.7–4.7)
Alkaline Phosphatase: 62 IU/L (ref 39–117)
BUN/Creatinine Ratio: 24 (ref 12–28)
BUN: 21 mg/dL (ref 8–27)
Bilirubin Total: 1.3 mg/dL — ABNORMAL HIGH (ref 0.0–1.2)
CO2: 23 mmol/L (ref 20–29)
Calcium: 9.6 mg/dL (ref 8.7–10.3)
Chloride: 102 mmol/L (ref 96–106)
Creatinine, Ser: 0.86 mg/dL (ref 0.57–1.00)
GFR calc Af Amer: 75 mL/min/{1.73_m2} (ref >59)
GFR calc non Af Amer: 65 mL/min/{1.73_m2} (ref >59)
Globulin, Total: 2.3 g/dL (ref 1.5–4.5)
Glucose: 102 mg/dL — ABNORMAL HIGH (ref 65–99)
Potassium: 3.7 mmol/L (ref 3.5–5.2)
Sodium: 140 mmol/L (ref 134–144)
Total Protein: 6.7 g/dL (ref 6.0–8.5)

## 2019-10-26 LAB — TSH: TSH: 3.48 u[IU]/mL (ref 0.450–4.500)

## 2019-10-26 LAB — LIPID PANEL
Chol/HDL Ratio: 2.8 ratio (ref 0.0–4.4)
Cholesterol, Total: 180 mg/dL (ref 100–199)
HDL: 64 mg/dL (ref >39)
LDL Chol Calc (NIH): 95 mg/dL (ref 0–99)
Triglycerides: 121 mg/dL (ref 0–149)
VLDL Cholesterol Cal: 21 mg/dL (ref 5–40)

## 2019-10-30 ENCOUNTER — Ambulatory Visit (INDEPENDENT_AMBULATORY_CARE_PROVIDER_SITE_OTHER): Payer: Medicare HMO | Admitting: Urology

## 2019-10-30 ENCOUNTER — Other Ambulatory Visit: Payer: Self-pay

## 2019-10-30 VITALS — BP 158/83 | HR 68 | Ht 61.0 in | Wt 140.0 lb

## 2019-10-30 DIAGNOSIS — N8111 Cystocele, midline: Secondary | ICD-10-CM

## 2019-10-30 DIAGNOSIS — N390 Urinary tract infection, site not specified: Secondary | ICD-10-CM

## 2019-10-30 LAB — MICROSCOPIC EXAMINATION
Bacteria, UA: NONE SEEN
RBC, Urine: NONE SEEN /hpf (ref 0–2)

## 2019-10-30 LAB — URINALYSIS, COMPLETE
Bilirubin, UA: NEGATIVE
Glucose, UA: NEGATIVE
Ketones, UA: NEGATIVE
Leukocytes,UA: NEGATIVE
Nitrite, UA: NEGATIVE
Protein,UA: NEGATIVE
RBC, UA: NEGATIVE
Specific Gravity, UA: 1.015 (ref 1.005–1.030)
Urobilinogen, Ur: 0.2 mg/dL (ref 0.2–1.0)
pH, UA: 7 (ref 5.0–7.5)

## 2019-10-30 LAB — BLADDER SCAN AMB NON-IMAGING: Scan Result: 189

## 2019-10-30 MED ORDER — NITROFURANTOIN MACROCRYSTAL 100 MG PO CAPS: 100.0000 mg | ORAL_CAPSULE | Freq: Four times a day (QID) | ORAL | 11 refills | Status: DC

## 2019-10-30 NOTE — Progress Notes (Signed)
10/30/2019 11:28 AM   Rebecca Gutierrez 1941-01-21 [id number]  Referring provider: [name], MD [address] [place],  [place] [address]  Chief Complaint  Patient presents with  . CYSTOCELE    HPI: Rebecca Gutierrez: Recurrent urinary tract infections and cystocele no longer wishing to be managed with a pessary   2019 I saw patient in 2019.   She can feel some vaginal bulging but she was actually quite nonspecific about it and has been chronic.  On pelvic examination she had a small grade 3 cystocele with moderate central defect that just reached the introitus.  She did not bear down well but her vaginal cuff descended from 8 or 9 cm to approximately 6 cm.  She had no rectocele.  She had mild hypermobility the bladder neck and no stress incontinence with a cystocele reduced.  She had mild vaginal shortening  Today Patient has had 5 or 6 bladder infections with typical cystitis symptoms that generally respond to antibiotics.  She does not really have prolapse symptoms otherwise.  She is had a hysterectomy.  She had an ultrasound that demonstrated a residual of 236 mL. Kidneys are normal  1 pelvic examination patient had moderate grade 3 cystocele with central defect. Vaginal cuff descended from 8 or 9 cm to approximately 5 cm. No rectocele. No stress incontinence. Cystocele was just reached the introitus  Residual today 175 mL  Picture drawn. Patient has a relatively asymptomatic cystocele. It could be related to the elevated residual. She understands if she had a transvaginal vault suspension with cystocele repair and graft her residual may improve with a natural history of her bladder infections is likely not going to change. She was getting breakthrough infections on trimethoprim. I suggest to see me back on daily Macrodantin in 2 months and check another residual. It is a lot of surgery to go through hoping to reduce bladder infection frequency. Role of urodynamics  notdiscussed      PMH: Past Medical History:  Diagnosis Date  . Abnormal ECG 10/28/2017  . Anxiety   . Bradycardia 11/10/2017  . Cancer ([place])    skin ca  . Cystocele 09/13/2013   small recurrent, less than before 09/13/13 JVF   . Gilbert's syndrome   . Hearing loss 1975  . History of recurrent urinary tract infection 03/24/2016  . Hyperlipidemia 2000  . Hypertension 2000    Surgical History: Past Surgical History:  Procedure Laterality Date  . ABDOMINAL HYSTERECTOMY  2014   partial  . ANTERIOR AND POSTERIOR REPAIR N/A 05/02/2013   Procedure: ANTERIOR (CYSTOCELE) AND POSTERIOR REPAIR (RECTOCELE);  Surgeon: [name], MD;  Location: [facility];  Service: Gynecology;  Laterality: N/A;  . CATARACT EXTRACTION, BILATERAL    . COLONOSCOPY  08/28/2011   Procedure: COLONOSCOPY;  Surgeon: [name], MD;  Location: [facility];  Service: Endoscopy;  Laterality: N/A;  930  . EYE SURGERY Bilateral 02/13/2011   other 2013  . 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 TUBAL LIGATION    . VAGINAL HYSTERECTOMY N/A 05/02/2013   Procedure: HYSTERECTOMY VAGINAL;  Surgeon: [name], MD;  Location: [facility];  Service: Gynecology;  Laterality: N/A;    Home Medications:  Allergies as of 10/30/2019      Reactions   Ciprofloxacin    Tendinitis    Levaquin [levofloxacin] Other (See Comments)   insomnia   [name] Diarrhea   [name] Palpitations, Rash  Last reaction was in 1969      Medication List       Accurate as of Oct 30, 2019 11:28 AM. If you have any questions, ask your nurse or doctor.        STOP taking these medications   nitrofurantoin (macrocrystal-monohydrate) 100 MG capsule Commonly known as: MACROBID Stopped by: Reece Packer, MD   trimethoprim 100 MG tablet Commonly known as: TRIMPEX Stopped by: Reece Packer, MD     TAKE these medications   aspirin EC 81 MG tablet Take 81 mg by mouth at  bedtime.   estradiol 0.1 MG/GM vaginal cream Commonly known as: ESTRACE VAGINAL Apply 0.5mg  (pea-sized amount)  just inside the vaginal introitus with a finger-tip on Monday, Wednesday and Friday nights.   hydrochlorothiazide 25 MG tablet Commonly known as: HYDRODIURIL TAKE 1 TABLET DAILY   metoprolol tartrate 25 MG tablet Commonly known as: LOPRESSOR TAKE ONE AND ONE-HALF TABLETS TWICE A DAY   nitrofurantoin 100 MG capsule Commonly known as: Macrodantin Take 1 capsule (100 mg total) by mouth 4 (four) times daily. Started by: Reece Packer, MD   ramipril 10 MG capsule Commonly known as: ALTACE Take 1 capsule (10 mg total) by mouth daily.   simvastatin 10 MG tablet Commonly known as: ZOCOR Take 1 tablet (10 mg total) by mouth daily.   VITAMIN D HIGH POTENCY PO Take by mouth.       Allergies:  Allergies  Allergen Reactions  . Ciprofloxacin     Tendinitis   . Levaquin [Levofloxacin] Other (See Comments)    insomnia  . [name] Diarrhea  . [name] Palpitations and Rash    Last reaction was in 1969    Family History: Family History  Problem Relation Age of Onset  . Hypertension Mother   . Cancer Mother        stomach  . Healthy Father   . Healthy Brother   . Colon cancer Neg Hx   . Prostate cancer Neg Hx   . Kidney cancer Neg Hx   . Bladder Cancer Neg Hx   . Breast cancer Neg Hx     Social History:  reports that she has never smoked. She has never used smokeless tobacco. She reports current alcohol use. She reports that she does not use drugs.  ROS:                                        Physical Exam: BP (!) 158/83   Pulse 68   Ht 5\' 1"  (1.549 m)   Wt 140 lb (63.5 kg)   BMI 26.45 kg/m   Constitutional:  Alert and oriented, No acute distress. HEENT: [place] AT, moist mucus membranes.  Trachea midline, no masses. Cardiovascular: No clubbing, cyanosis, or edema. Respiratory: Normal respiratory  effort, no increased work of breathing. GI: Abdomen is soft, nontender, nondistended, no abdominal masses GU: No CVA tenderness.  Skin: No rashes, bruises or suspicious lesions. Lymph: No cervical or inguinal adenopathy. Neurologic: Grossly intact, no focal deficits, moving all 4 extremities. Psychiatric: Normal mood and affect.  Laboratory Data: Lab Results  Component Value Date   WBC 4.4 10/25/2019   HGB 13.8 10/25/2019   HCT 40.4 10/25/2019   MCV 89 10/25/2019   PLT 236 10/25/2019    Lab Results  Component Value Date   CREATININE 0.86 10/25/2019    No results  found for: PSA  No results found for: TESTOSTERONE  Lab Results  Component Value Date   HGBA1C 5.6 07/23/2011    Urinalysis    Component Value Date/Time   COLORURINE YELLOW 08/07/2019 1128   APPEARANCEUR Cloudy (A) 10/17/2019 1158   LABSPEC 1.010 08/07/2019 1128   PHURINE 6.5 08/07/2019 1128   GLUCOSEU Negative 10/17/2019 1158   HGBUR TRACE (A) 08/07/2019 1128   BILIRUBINUR Negative 10/17/2019 1158   KETONESUR NEGATIVE 08/07/2019 1128   PROTEINUR Negative 10/17/2019 1158   PROTEINUR NEGATIVE 08/07/2019 1128   UROBILINOGEN 0.2 01/13/2018 1147   UROBILINOGEN 0.2 05/02/2013 0930   NITRITE Positive (A) 10/17/2019 1158   NITRITE NEGATIVE 08/07/2019 1128   LEUKOCYTESUR 3+ (A) 10/17/2019 1158   LEUKOCYTESUR MODERATE (A) 08/07/2019 1128    Pertinent Imaging:   Assessment & Plan: I will see the patient in 8 weeks with a residual on daily Macrodantin call if urine culture is positive  There are no diagnoses linked to this encounter.  No follow-ups on file.  Reece Packer, MD  [place] [address] [place], [place] [address] [phone]

## 2019-11-07 ENCOUNTER — Telehealth: Payer: Self-pay

## 2019-11-07 DIAGNOSIS — N390 Urinary tract infection, site not specified: Secondary | ICD-10-CM

## 2019-11-07 MED ORDER — NITROFURANTOIN MACROCRYSTAL 100 MG PO CAPS: 100.0000 mg | ORAL_CAPSULE | Freq: Every day | ORAL | 3 refills | Status: DC

## 2019-11-07 NOTE — Telephone Encounter (Signed)
Incoming call from pt requesting RX be changed to [facility]. RX changed.

## 2019-12-19 ENCOUNTER — Other Ambulatory Visit: Payer: Self-pay

## 2019-12-19 ENCOUNTER — Ambulatory Visit
Admission: RE | Admit: 2019-12-19 | Discharge: 2019-12-19 | Disposition: A | Discharge status: Home / Self Care | Payer: Medicare HMO | Source: Ambulatory Visit | Attending: Internal Medicine | Admitting: Internal Medicine

## 2019-12-19 DIAGNOSIS — Z1231 Encounter for screening mammogram for malignant neoplasm of breast: Secondary | ICD-10-CM | POA: Diagnosis present

## 2020-01-01 ENCOUNTER — Ambulatory Visit (INDEPENDENT_AMBULATORY_CARE_PROVIDER_SITE_OTHER): Payer: Medicare HMO | Admitting: Urology

## 2020-01-01 ENCOUNTER — Encounter: Payer: Self-pay | Admitting: Urology

## 2020-01-01 ENCOUNTER — Other Ambulatory Visit: Payer: Self-pay

## 2020-01-01 VITALS — BP 162/83 | HR 66 | Ht 61.0 in | Wt 140.2 lb

## 2020-01-01 DIAGNOSIS — N390 Urinary tract infection, site not specified: Secondary | ICD-10-CM

## 2020-01-01 LAB — BLADDER SCAN AMB NON-IMAGING: Scan Result: 83

## 2020-01-01 MED ORDER — NITROFURANTOIN MACROCRYSTAL 100 MG PO CAPS: 100.0000 mg | ORAL_CAPSULE | Freq: Every day | ORAL | 3 refills | Status: DC

## 2020-01-01 NOTE — Progress Notes (Signed)
01/01/2020 10:42 AM   Cristal Ford Ellinger 09-15-1940 [id number]  Referring provider: [name], MD [address] [place]  [address]  Chief Complaint  Patient presents with  . Follow-up    HPI: [name]: Recurrent urinary tract infections and cystocele no longer wishing to be managed with a pessary   2019 I saw patient in 2019.  She can feel some vaginal bulging but she was actually quite nonspecific about it and has been chronic.  Patient has had 5 or 6 bladder infections with typical cystitis symptoms that generally respond to antibiotics.  She does not really have prolapse symptoms otherwise.  She is had a hysterectomy.  She had an ultrasound that demonstrated a residual of 236 mL. Kidneys are normal  1 pelvic examination patient had moderate grade 3 cystocele with central defect. Vaginal cuff descended from 8 or 9 cm to approximately 5 cm. No rectocele. No stress incontinence. Cystocele was just reached the introitus  Residual today 175 mL  Picture drawn. Patient has a relatively asymptomatic cystocele. It could be related to the elevated residual. She understands if she had a transvaginal vault suspension with cystocele repair and graft her residual may improve with a natural history of her bladder infections is likely not going to change. She was getting breakthrough infections on trimethoprim. I suggest to see me back on daily Macrodantin in 2 months and check another residual. It is a lot of surgery to go through hoping to reduce bladder infection frequency. Role of urodynamics not discussed  I will see the patient in 8 weeks with a residual on daily Macrodantin call if urine culture is positive  Today Frequency stable.  Last culture was positive.  Cystocele stable Patient infection free on Macrodantin but for some reason she thought she should take it 4 times a day Clinically not infected today and doing very well I spent a lot of time  clarifying to take it once a day and prescription was renewed 90x3 and see in 4 months    PMH: Past Medical History:  Diagnosis Date  . Abnormal ECG 10/28/2017  . Anxiety   . Bradycardia 11/10/2017  . Cancer ([place])    skin ca  . Cystocele 09/13/2013   small recurrent, less than before 09/13/13 JVF   . Gilbert's syndrome   . Hearing loss 1975  . History of recurrent urinary tract infection 03/24/2016  . Hyperlipidemia 2000  . Hypertension 2000    Surgical History: Past Surgical History:  Procedure Laterality Date  . ABDOMINAL HYSTERECTOMY  2014   partial  . ANTERIOR AND POSTERIOR REPAIR N/A 05/02/2013   Procedure: ANTERIOR (CYSTOCELE) AND POSTERIOR REPAIR (RECTOCELE);  Surgeon: [name], MD;  Location: [facility];  Service: Gynecology;  Laterality: N/A;  . CATARACT EXTRACTION, BILATERAL    . COLONOSCOPY  08/28/2011   Procedure: COLONOSCOPY;  Surgeon: [name], MD;  Location: [facility];  Service: Endoscopy;  Laterality: N/A;  930  . EYE SURGERY Bilateral 02/13/2011   other 2013  . 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 TUBAL LIGATION    . VAGINAL HYSTERECTOMY N/A 05/02/2013   Procedure: HYSTERECTOMY VAGINAL;  Surgeon: [name], MD;  Location: [facility];  Service: Gynecology;  Laterality: N/A;    Home Medications:  Allergies as of 01/01/2020      Reactions   Ciprofloxacin    Tendinitis    Levaquin [levofloxacin] Other (See Comments)  insomnia   [name] Diarrhea   [name] Palpitations, Rash   Last reaction was in 1969      Medication List       Accurate as of January 01, 2020 10:42 AM. If you have any questions, ask your nurse or doctor.        aspirin EC 81 MG tablet Take 81 mg by mouth at bedtime.   estradiol 0.1 MG/GM vaginal cream Commonly known as: ESTRACE VAGINAL Apply 0.5mg  (pea-sized amount)  just inside the vaginal introitus with a finger-tip on Monday, Wednesday and Friday nights.     hydrochlorothiazide 25 MG tablet Commonly known as: HYDRODIURIL TAKE 1 TABLET DAILY   metoprolol tartrate 25 MG tablet Commonly known as: LOPRESSOR TAKE ONE AND ONE-HALF TABLETS TWICE A DAY   nitrofurantoin 100 MG capsule Commonly known as: Macrodantin Take 1 capsule (100 mg total) by mouth daily.   ramipril 10 MG capsule Commonly known as: ALTACE Take 1 capsule (10 mg total) by mouth daily.   simvastatin 10 MG tablet Commonly known as: ZOCOR Take 1 tablet (10 mg total) by mouth daily.   VITAMIN D HIGH POTENCY PO Take by mouth.       Allergies:  Allergies  Allergen Reactions  . Ciprofloxacin     Tendinitis   . Levaquin [Levofloxacin] Other (See Comments)    insomnia  . [name] Diarrhea  . [name] Palpitations and Rash    Last reaction was in 1969    Family History: Family History  Problem Relation Age of Onset  . Hypertension Mother   . Cancer Mother        stomach  . Healthy Father   . Healthy Brother   . Colon cancer Neg Hx   . Prostate cancer Neg Hx   . Kidney cancer Neg Hx   . Bladder Cancer Neg Hx   . Breast cancer Neg Hx     Social History:  reports that she has never smoked. She has never used smokeless tobacco. She reports current alcohol use. She reports that she does not use drugs.  ROS:                                        Physical Exam: There were no vitals taken for this visit.  Constitutional:  Alert and oriented, No acute distress.  Laboratory Data: Lab Results  Component Value Date   WBC 4.4 10/25/2019   HGB 13.8 10/25/2019   HCT 40.4 10/25/2019   MCV 89 10/25/2019   PLT 236 10/25/2019    Lab Results  Component Value Date   CREATININE 0.86 10/25/2019    No results found for: PSA  No results found for: TESTOSTERONE  Lab Results  Component Value Date   HGBA1C 5.6 07/23/2011    Urinalysis    Component Value Date/Time   COLORURINE YELLOW 08/07/2019 1128    APPEARANCEUR Clear 10/30/2019 1055   LABSPEC 1.010 08/07/2019 1128   PHURINE 6.5 08/07/2019 1128   GLUCOSEU Negative 10/30/2019 1055   HGBUR TRACE (A) 08/07/2019 1128   BILIRUBINUR Negative 10/30/2019 1055   KETONESUR NEGATIVE 08/07/2019 1128   PROTEINUR Negative 10/30/2019 1055   PROTEINUR NEGATIVE 08/07/2019 1128   UROBILINOGEN 0.2 01/13/2018 1147   UROBILINOGEN 0.2 05/02/2013 0930   NITRITE Negative 10/30/2019 1055   NITRITE NEGATIVE 08/07/2019 1128   LEUKOCYTESUR Negative 10/30/2019 1055   LEUKOCYTESUR MODERATE (A) 08/07/2019  1128    Pertinent Imaging:   Assessment & Plan: Forde Dandy became a little bit circular but I will see her in 4 months on daily Macrodantin.  She understands that when she takes it twice a day is to treat a 1 week infection and there is no role to take it 4 times a day  1. Recurrent UTI  - Bladder Scan (Post Void Residual) in office   No follow-ups on file.  Reece Packer, MD  [facility] [address], [place], [place] [address] [phone]

## 2020-02-02 ENCOUNTER — Encounter: Payer: Self-pay | Admitting: Internal Medicine

## 2020-02-02 ENCOUNTER — Other Ambulatory Visit: Payer: Self-pay

## 2020-02-02 ENCOUNTER — Ambulatory Visit (INDEPENDENT_AMBULATORY_CARE_PROVIDER_SITE_OTHER): Payer: Medicare HMO | Admitting: Internal Medicine

## 2020-02-02 VITALS — BP 122/62 | HR 60 | Temp 97.8°F | Ht 61.0 in | Wt 137.0 lb

## 2020-02-02 DIAGNOSIS — I700 Atherosclerosis of aorta: Secondary | ICD-10-CM | POA: Diagnosis not present

## 2020-02-02 DIAGNOSIS — R1013 Epigastric pain: Secondary | ICD-10-CM

## 2020-02-02 NOTE — Progress Notes (Signed)
Date:  02/02/2020   Name:  Rebecca Gutierrez   DOB:  Mar 27, 1941   MRN:  [id number]   Chief Complaint: Follow-up (feeling fine today )  Chest Pain  This is a new problem. The problem has been resolved. The pain is present in the substernal region. Associated symptoms include back pain and vomiting (one episode the next day after eating). Pertinent negatives include no abdominal pain, cough, diaphoresis, dizziness, fever, headaches or shortness of breath. Associated symptoms comments: And 8 episodes of diarrhea over the next 12 hours.  Seen in [facility] ER on 01/27/20.  Patient's presentation is difficult to discern etiology but potentially esophageal spasm or reflux related given occurred right after eating cold ice cream. Also possibly ACS as the patient does have risk factors of age and hypertension, but does exercise and does not get chest pain with that. This was also onset while not exerting herself. Initial EKG had some ST and T wave abnormalities that were nonspecific but no ST elevations. Serial troponins were obtained and both were negative including no elevated delta. Patient had no further symptoms in the emergency department. She was a heart score of 5, and we discussed options with the patient has close outpatient follow-up with her cardiologist already scheduled.   Lab Results  Component Value Date   CREATININE 0.86 10/25/2019   BUN 21 10/25/2019   NA 140 10/25/2019   K 3.7 10/25/2019   CL 102 10/25/2019   CO2 23 10/25/2019   Lab Results  Component Value Date   CHOL 180 10/25/2019   HDL 64 10/25/2019   LDLCALC 95 10/25/2019   TRIG 121 10/25/2019   CHOLHDL 2.8 10/25/2019   Lab Results  Component Value Date   TSH 3.480 10/25/2019   Lab Results  Component Value Date   HGBA1C 5.6 07/23/2011   Lab Results  Component Value Date   WBC 4.4 10/25/2019   HGB 13.8 10/25/2019   HCT 40.4 10/25/2019   MCV 89 10/25/2019   PLT 236 10/25/2019   Lab Results  Component Value Date    ALT 19 10/25/2019   AST 20 10/25/2019   ALKPHOS 62 10/25/2019   BILITOT 1.3 (H) 10/25/2019     Review of Systems  Constitutional: Negative for diaphoresis and fever.  HENT: Negative for trouble swallowing.   Respiratory: Negative for cough, chest tightness and shortness of breath.   Cardiovascular: Positive for chest pain.  Gastrointestinal: Positive for diarrhea (now resolved) and vomiting (one episode the next day after eating). Negative for abdominal distention, abdominal pain and blood in stool.  Musculoskeletal: Positive for back pain.  Neurological: Negative for dizziness and headaches.    Patient Active Problem List   Diagnosis Date Noted  . Aortic atherosclerosis ([place]) 02/02/2020  . Non-melanoma skin cancer 10/25/2019  . Cystocele, midline 04/11/2018  . Moderate mitral insufficiency 11/10/2017  . 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 Recurrent UTI 03/10/2015  . OSTEOPENIA 06/28/2009  . Hyperlipidemia LDL goal <100 06/02/2007  . 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 Hearing loss 06/02/2007  . Essential hypertension, benign 06/02/2007  . Acute bilateral low back pain with bilateral sciatica 06/02/2007    Allergies  Allergen Reactions  . Ciprofloxacin     Tendinitis   . Levaquin [Levofloxacin] Other (See Comments)    insomnia  . [name] Diarrhea  . [name] Palpitations and Rash    Last reaction was in 1969    Past Surgical History:  Procedure Laterality Date  . ABDOMINAL  HYSTERECTOMY  2014   partial  . ANTERIOR AND POSTERIOR REPAIR N/A 05/02/2013   Procedure: ANTERIOR (CYSTOCELE) AND POSTERIOR REPAIR (RECTOCELE);  Surgeon: [name], MD;  Location: [facility];  Service: Gynecology;  Laterality: N/A;  . CATARACT EXTRACTION, BILATERAL    . COLONOSCOPY  08/28/2011   Procedure: COLONOSCOPY;  Surgeon: [name], MD;  Location: [facility];  Service: Endoscopy;  Laterality: N/A;  930  . EYE SURGERY Bilateral 02/13/2011    other 2013  . 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 TUBAL LIGATION    . VAGINAL HYSTERECTOMY N/A 05/02/2013   Procedure: HYSTERECTOMY VAGINAL;  Surgeon: [name], MD;  Location: [facility];  Service: Gynecology;  Laterality: N/A;    Social History   Tobacco Use  . Smoking status: Never Smoker  . Smokeless tobacco: Never Used  . Tobacco comment: smoking cessation materials not required  Vaping Use  . Vaping Use: Never used  Substance Use Topics  . Alcohol use: Yes    Comment: one drink per week  . Drug use: No     Medication list has been reviewed and updated.  Current Meds  Medication Sig  . aspirin EC 81 MG tablet Take 81 mg by mouth at bedtime.   . Cholecalciferol (VITAMIN D HIGH POTENCY PO) Take by mouth.  . estradiol (ESTRACE VAGINAL) 0.1 MG/GM vaginal cream Apply 0.5mg  (pea-sized amount)  just inside the vaginal introitus with a finger-tip on Monday, Wednesday and Friday nights.  . hydrochlorothiazide (HYDRODIURIL) 25 MG tablet TAKE 1 TABLET DAILY  . metoprolol tartrate (LOPRESSOR) 25 MG tablet TAKE ONE AND ONE-HALF TABLETS TWICE A DAY  . nitrofurantoin (MACRODANTIN) 100 MG capsule Take 1 capsule (100 mg total) by mouth daily.  . ramipril (ALTACE) 10 MG capsule Take 1 capsule (10 mg total) by mouth daily.  . simvastatin (ZOCOR) 10 MG tablet Take 1 tablet (10 mg total) by mouth daily.    PHQ 2/9 Scores 02/02/2020 10/25/2019 10/18/2019 10/02/2019  PHQ - 2 Score 0 0 0 0  PHQ- 9 Score 0 0 - 0    GAD 7 : Generalized Anxiety Score 02/02/2020 10/25/2019 10/02/2019  Nervous, Anxious, on Edge 0 0 0  Control/stop worrying 0 0 0  Worry too much - different things 0 0 0  Trouble relaxing 0 0 0  Restless 0 0 0  Easily annoyed or irritable 0 0 0  Afraid - awful might happen 0 0 0  Total GAD 7 Score 0 0 0  Anxiety Difficulty Not difficult at all Not difficult at all Not difficult at all    BP Readings from Last 3 Encounters:  02/02/20 122/62  01/01/20  (!) 162/83  10/30/19 (!) 158/83    Physical Exam Vitals and nursing note reviewed.  Constitutional:      General: She is not in acute distress.    Appearance: She is well-developed.  HENT:     Head: Normocephalic and atraumatic.  Cardiovascular:     Rate and Rhythm: Normal rate and regular rhythm.     Pulses: Normal pulses.     Heart sounds: No murmur heard.   Pulmonary:     Effort: Pulmonary effort is normal. No respiratory distress.  Abdominal:     General: Abdomen is flat. Bowel sounds are normal.     Palpations: Abdomen is soft.     Tenderness: There is no abdominal tenderness. There is no guarding or rebound.  Musculoskeletal:  Cervical back: Normal range of motion.     Right lower leg: No edema.     Left lower leg: No edema.  Lymphadenopathy:     Cervical: No cervical adenopathy.  Skin:    General: Skin is warm and dry.     Findings: No rash.  Neurological:     Mental Status: She is alert and oriented to person, place, and time.  Psychiatric:        Behavior: Behavior normal.        Thought Content: Thought content normal.     Wt Readings from Last 3 Encounters:  02/02/20 137 lb (62.1 kg)  01/01/20 140 lb 3.2 oz (63.6 kg)  10/30/19 140 lb (63.5 kg)    BP 122/62   Pulse 60   Temp 97.8 F (36.6 C) (Oral)   Ht 5\' 1"  (1.549 m)   Wt 137 lb (62.1 kg)   SpO2 96%   BMI 25.89 kg/m   Assessment and Plan: 1. Epigastric pain Episode of chest pain concerning for esophageal spasm vs severe reflux The diarrhea afterwards and post prandial occurrence of pain could be gall bladder disease. Recommend diet ad lib; no medication for now Consider GI consult if [name] is negative and/or sx recur - US Abdomen Limited RUQ; Future  2. Aortic atherosclerosis ([facility]) Continue statin and aspirin   Partially dictated using Editor, commissioning. Any errors are unintentional.  Halina Maidens, MD [facility]  02/02/2020

## 2020-02-08 ENCOUNTER — Other Ambulatory Visit: Payer: Self-pay

## 2020-02-08 ENCOUNTER — Ambulatory Visit
Admission: RE | Admit: 2020-02-08 | Discharge: 2020-02-08 | Disposition: A | Discharge status: Home / Self Care | Payer: Medicare HMO | Source: Ambulatory Visit | Attending: Internal Medicine | Admitting: Internal Medicine

## 2020-02-08 DIAGNOSIS — R1013 Epigastric pain: Secondary | ICD-10-CM | POA: Diagnosis not present

## 2020-04-12 ENCOUNTER — Other Ambulatory Visit: Payer: Self-pay | Admitting: Internal Medicine

## 2020-04-12 DIAGNOSIS — R002 Palpitations: Secondary | ICD-10-CM

## 2020-04-12 DIAGNOSIS — I10 Essential (primary) hypertension: Secondary | ICD-10-CM

## 2020-04-29 ENCOUNTER — Ambulatory Visit (INDEPENDENT_AMBULATORY_CARE_PROVIDER_SITE_OTHER): Payer: Medicare HMO | Admitting: Internal Medicine

## 2020-04-29 ENCOUNTER — Encounter: Payer: Self-pay | Admitting: Internal Medicine

## 2020-04-29 ENCOUNTER — Other Ambulatory Visit: Payer: Self-pay

## 2020-04-29 VITALS — BP 130/78 | HR 60 | Temp 97.8°F | Ht 61.0 in | Wt 134.0 lb

## 2020-04-29 DIAGNOSIS — Z23 Encounter for immunization: Secondary | ICD-10-CM

## 2020-04-29 DIAGNOSIS — R194 Change in bowel habit: Secondary | ICD-10-CM | POA: Diagnosis not present

## 2020-04-29 DIAGNOSIS — I10 Essential (primary) hypertension: Secondary | ICD-10-CM | POA: Diagnosis not present

## 2020-04-29 NOTE — Progress Notes (Signed)
Date:  04/29/2020   Name:  Rebecca Gutierrez   DOB:  01/16/1941   MRN:  [id number]   Chief Complaint: Hypertension (Follow up. High dose flu shot today.) and Change in Bowels (in the last year she is noticing her bowels are changing. She doesn't go as often. And at the end of the stool her bowel is hard after coming out soft. Noticed some weight loss as well. )  Hypertension This is a chronic problem. The problem is controlled. Pertinent negatives include no chest pain, headaches, palpitations or shortness of breath. Past treatments include ACE inhibitors and beta blockers. The current treatment provides significant improvement.  Change in bowel habits - over the past few months has noticed that she only has bowel movement every other day.  Previously had one or two per day that were softer and smaller in diameter.  She denies any sx similar to gall bladder attack last summer.  Her appetite is less and she has lost 6 lbs without effort.  Lab Results  Component Value Date   CREATININE 0.86 10/25/2019   BUN 21 10/25/2019   NA 140 10/25/2019   K 3.7 10/25/2019   CL 102 10/25/2019   CO2 23 10/25/2019   Lab Results  Component Value Date   CHOL 180 10/25/2019   HDL 64 10/25/2019   LDLCALC 95 10/25/2019   TRIG 121 10/25/2019   CHOLHDL 2.8 10/25/2019   Lab Results  Component Value Date   TSH 3.480 10/25/2019   Lab Results  Component Value Date   HGBA1C 5.6 07/23/2011   Lab Results  Component Value Date   WBC 4.4 10/25/2019   HGB 13.8 10/25/2019   HCT 40.4 10/25/2019   MCV 89 10/25/2019   PLT 236 10/25/2019   Lab Results  Component Value Date   ALT 19 10/25/2019   AST 20 10/25/2019   ALKPHOS 62 10/25/2019   BILITOT 1.3 (H) 10/25/2019     Review of Systems  Constitutional: Positive for unexpected weight change. Negative for appetite change, fatigue and fever.  HENT: Negative for tinnitus and trouble swallowing.   Eyes: Negative for visual disturbance.  Respiratory:  Negative for cough, chest tightness and shortness of breath.   Cardiovascular: Negative for chest pain, palpitations and leg swelling.  Gastrointestinal: Positive for rectal pain (fullness). Negative for abdominal pain and blood in stool.       Vague abdominal sx - LLQ does not feel "right"  Endocrine: Negative for polydipsia and polyuria.  Genitourinary: Negative for dysuria and hematuria.  Musculoskeletal: Negative for arthralgias.  Neurological: Negative for tremors, numbness and headaches.  Psychiatric/Behavioral: Negative for dysphoric mood and sleep disturbance.    Patient Active Problem List   Diagnosis Date Noted  . Aortic atherosclerosis ([place]) 02/02/2020  . Non-melanoma skin cancer 10/25/2019  . Cystocele, midline 04/11/2018  . Moderate mitral insufficiency 11/10/2017  . 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 Recurrent UTI 03/10/2015  . OSTEOPENIA 06/28/2009  . Hyperlipidemia LDL goal <100 06/02/2007  . 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 Hearing loss 06/02/2007  . Essential hypertension, benign 06/02/2007  . Acute bilateral low back pain with bilateral sciatica 06/02/2007    Allergies  Allergen Reactions  . Ciprofloxacin     Tendinitis   . Levaquin [Levofloxacin] Other (See Comments)    insomnia  . [name] Diarrhea  . [name] Palpitations and Rash    Last reaction was in 1969    Past Surgical History:  Procedure Laterality Date  .  ABDOMINAL HYSTERECTOMY  2014   partial  . ANTERIOR AND POSTERIOR REPAIR N/A 05/02/2013   Procedure: ANTERIOR (CYSTOCELE) AND POSTERIOR REPAIR (RECTOCELE);  Surgeon: [name], MD;  Location: [facility];  Service: Gynecology;  Laterality: N/A;  . CATARACT EXTRACTION, BILATERAL    . COLONOSCOPY  08/28/2011   Procedure: COLONOSCOPY;  Surgeon: [name], MD;  Location[facility];  Service: Endoscopy;  Laterality: N/A;  930  . EYE SURGERY Bilateral 02/13/2011   other 2013  . MOHS SURGERY   06/2016   Removed skin cancer from Rt shin  . TUBAL LIGATION    . VAGINAL HYSTERECTOMY N/A 05/02/2013   Procedure: HYSTERECTOMY VAGINAL;  Surgeon: [name], MD;  Location: [facility];  Service: Gynecology;  Laterality: N/A;    Social History   Tobacco Use  . Smoking status: Never Smoker  . Smokeless tobacco: Never Used  . Tobacco comment: smoking cessation materials not required  Vaping Use  . Vaping Use: Never used  Substance Use Topics  . Alcohol use: Yes    Comment: one drink per week  . Drug use: No     Medication list has been reviewed and updated.  Current Meds  Medication Sig  . aspirin EC 81 MG tablet Take 81 mg by mouth at bedtime.   . Cholecalciferol (VITAMIN D HIGH POTENCY PO) Take by mouth.  . estradiol (ESTRACE VAGINAL) 0.1 MG/GM vaginal cream Apply 0.5mg  (pea-sized amount)  just inside the vaginal introitus with a finger-tip on Monday, Wednesday and Friday nights.  . hydrochlorothiazide (HYDRODIURIL) 25 MG tablet TAKE 1 TABLET DAILY  . metoprolol tartrate (LOPRESSOR) 25 MG tablet TAKE ONE AND ONE-HALF TABLETS TWICE A DAY  . nitrofurantoin (MACRODANTIN) 100 MG capsule Take 1 capsule (100 mg total) by mouth daily.  . ramipril (ALTACE) 10 MG capsule TAKE 1 CAPSULE DAILY  . simvastatin (ZOCOR) 10 MG tablet Take 1 tablet (10 mg total) by mouth daily.    PHQ 2/9 Scores 04/29/2020 02/02/2020 10/25/2019 10/18/2019  PHQ - 2 Score 0 0 0 0  PHQ- 9 Score 0 0 0 -    GAD 7 : Generalized Anxiety Score 04/29/2020 02/02/2020 10/25/2019 10/02/2019  Nervous, Anxious, on Edge 0 0 0 0  Control/stop worrying 0 0 0 0  Worry too much - different things 0 0 0 0  Trouble relaxing 0 0 0 0  Restless 0 0 0 0  Easily annoyed or irritable 0 0 0 0  Afraid - awful might happen 0 0 0 0  Total GAD 7 Score 0 0 0 0  Anxiety Difficulty Not difficult at all Not difficult at all Not difficult at all Not difficult at all    BP Readings from Last 3 Encounters:  04/29/20 130/78  02/02/20 122/62   01/01/20 (!) 162/83    Physical Exam Vitals and nursing note reviewed.  Constitutional:      General: She is not in acute distress.    Appearance: Normal appearance. She is well-developed.  HENT:     Head: Normocephalic and atraumatic.  Cardiovascular:     Rate and Rhythm: Normal rate and regular rhythm.     Pulses: Normal pulses.     Heart sounds: No murmur heard.   Pulmonary:     Effort: Pulmonary effort is normal. No respiratory distress.  Abdominal:     General: Abdomen is flat. Bowel sounds are normal.     Palpations: Abdomen is soft.     Tenderness: There is no  abdominal tenderness. There is no guarding or rebound.     Hernia: No hernia is present.  Musculoskeletal:     Cervical back: Normal range of motion.     Right lower leg: No edema.     Left lower leg: No edema.  Lymphadenopathy:     Cervical: No cervical adenopathy.  Skin:    General: Skin is warm and dry.     Capillary Refill: Capillary refill takes less than 2 seconds.     Findings: No rash.  Neurological:     General: No focal deficit present.     Mental Status: She is alert and oriented to person, place, and time.  Psychiatric:        Mood and Affect: Mood normal.     Wt Readings from Last 3 Encounters:  04/29/20 134 lb (60.8 kg)  02/02/20 137 lb (62.1 kg)  01/01/20 140 lb 3.2 oz (63.6 kg)    BP 130/78   Pulse 60   Temp 97.8 F (36.6 C) (Oral)   Ht 5\' 1"  (1.549 m)   Wt 134 lb (60.8 kg)   SpO2 97%   BMI 25.32 kg/m   Assessment and Plan: 1. Essential hypertension, benign Clinically stable exam with well controlled BP on altace, metoprolol and hctz. Tolerating medications without side effects at this time. Pt to continue current regimen and low sodium diet; benefits of regular exercise as able discussed.  2. Change in bowel habits Abdominal exam is benign Rectal deferred - Ambulatory referral to Gastroenterology   Partially dictated using Dragon software. Any errors are  unintentional.  Halina Maidens, MD [facility]  04/29/2020

## 2020-05-06 ENCOUNTER — Encounter: Payer: Self-pay | Admitting: Urology

## 2020-05-06 ENCOUNTER — Other Ambulatory Visit: Payer: Self-pay

## 2020-05-06 ENCOUNTER — Ambulatory Visit (INDEPENDENT_AMBULATORY_CARE_PROVIDER_SITE_OTHER): Payer: Medicare HMO | Admitting: Urology

## 2020-05-06 VITALS — BP 144/72 | HR 72

## 2020-05-06 DIAGNOSIS — N3020 Other chronic cystitis without hematuria: Secondary | ICD-10-CM

## 2020-05-06 DIAGNOSIS — N390 Urinary tract infection, site not specified: Secondary | ICD-10-CM | POA: Diagnosis not present

## 2020-05-06 MED ORDER — NITROFURANTOIN MACROCRYSTAL 100 MG PO CAPS: 100.0000 mg | ORAL_CAPSULE | Freq: Every day | ORAL | 3 refills | Status: DC

## 2020-05-06 NOTE — Progress Notes (Signed)
05/06/2020 10:18 AM   Rebecca Gutierrez 12/30/1940 [id number]  Referring provider: [name], Ashley [name],  [place] [address]  No chief complaint on file.  2019I saw patient in 2019.  She can feel some vaginal bulging but she was actually quite nonspecific about it and has been chronic.  Used to be managed with pessary  Patient has had 5 or 6 bladder infections with typical cystitis symptoms that generally respond to antibiotics. She does not really have prolapse symptoms otherwise. She is had a hysterectomy. She had an ultrasound that demonstrated a residual of 236 mL.Kidneys are normal  1 pelvic examination patient had moderate grade 3 cystocele with central defect. Vaginal cuff descended from 8 or 9 cm to approximately 5 cm. No rectocele. No stress incontinence. Cystocele was just reached the introitus  Residual today 175 mL  Picture drawn. Patient has a relatively asymptomatic cystocele. It could be related to the elevated residual. She understands if she had a transvaginal vault suspension with cystocele repair and graft her residual may improve with a natural history of her bladder infections is likely not going to change. She was getting breakthrough infections on trimethoprim. I suggest to see me back on daily Macrodantin in 2 months and check another residual. It is a lot of surgery to go through hoping to reduce bladder infection frequency. Role of urodynamicsnot discussed  Patient infection free on Macrodantin but for some reason she thought she should take it 4 times a day  I spent a lot of time clarifying to take it once a day and prescription was renewed 90x3 and see in 4 months  Today  Is delighted an infection free on daily Macrodantin.  Frequency stable.  No urge incontinence.  Not infected today  PMH: Past Medical History:  Diagnosis Date  . Abnormal ECG 10/28/2017  . Anxiety   . Bradycardia 11/10/2017  . Cancer ([place])      skin ca  . Cystocele 09/13/2013   small recurrent, less than before 09/13/13 JVF   . Gilbert's syndrome   . Hearing loss 1975  . History of recurrent urinary tract infection 03/24/2016  . Hyperlipidemia 2000  . Hypertension 2000    Surgical History: Past Surgical History:  Procedure Laterality Date  . ABDOMINAL HYSTERECTOMY  2014   partial  . ANTERIOR AND POSTERIOR REPAIR N/A 05/02/2013   Procedure: ANTERIOR (CYSTOCELE) AND POSTERIOR REPAIR (RECTOCELE);  Surgeon: [name], MD;  Location: [facility];  Service: Gynecology;  Laterality: N/A;  . CATARACT EXTRACTION, BILATERAL    . COLONOSCOPY  08/28/2011   Procedure: COLONOSCOPY;  Surgeon: [name], MD;  Location: [facility];  Service: Endoscopy;  Laterality: N/A;  930  . EYE SURGERY Bilateral 02/13/2011   other 2013  . 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 TUBAL LIGATION    . VAGINAL HYSTERECTOMY N/A 05/02/2013   Procedure: HYSTERECTOMY VAGINAL;  Surgeon: [name], MD;  Location: [facility];  Service: Gynecology;  Laterality: N/A;    Home Medications:  Allergies as of 05/06/2020      Reactions   Ciprofloxacin    Tendinitis    Levaquin [levofloxacin] Other (See Comments)   insomnia   [name] Diarrhea   [name] Palpitations, Rash   Last reaction was in 1969      Medication List       Accurate as of May 06, 2020 10:18 AM. If you  have any questions, ask your nurse or doctor.        aspirin EC 81 MG tablet Take 81 mg by mouth at bedtime.   estradiol 0.1 MG/GM vaginal cream Commonly known as: ESTRACE VAGINAL Apply 0.5mg  (pea-sized amount)  just inside the vaginal introitus with a finger-tip on Monday, Wednesday and Friday nights.   hydrochlorothiazide 25 MG tablet Commonly known as: HYDRODIURIL TAKE 1 TABLET DAILY   metoprolol tartrate 25 MG tablet Commonly known as: LOPRESSOR TAKE ONE AND ONE-HALF TABLETS TWICE A DAY   nitrofurantoin 100 MG  capsule Commonly known as: Macrodantin Take 1 capsule (100 mg total) by mouth daily.   ramipril 10 MG capsule Commonly known as: ALTACE TAKE 1 CAPSULE DAILY   simvastatin 10 MG tablet Commonly known as: ZOCOR Take 1 tablet (10 mg total) by mouth daily.   VITAMIN D HIGH POTENCY PO Take by mouth.       Allergies:  Allergies  Allergen Reactions  . Ciprofloxacin     Tendinitis   . Levaquin [Levofloxacin] Other (See Comments)    insomnia  . [name] Diarrhea  . [name] Palpitations and Rash    Last reaction was in 1969    Family History: Family History  Problem Relation Age of Onset  . Hypertension Mother   . Cancer Mother        stomach  . Healthy Father   . Healthy Brother   . Colon cancer Neg Hx   . Prostate cancer Neg Hx   . Kidney cancer Neg Hx   . Bladder Cancer Neg Hx   . Breast cancer Neg Hx     Social History:  reports that she has never smoked. She has never used smokeless tobacco. She reports current alcohol use. She reports that she does not use drugs.  ROS:                                        Physical Exam: There were no vitals taken for this visit.  Constitutional:  Alert and oriented, No acute distress. HEENT: [place] AT, moist mucus membranes.  Trachea midline, no masses. Cardiovascular: No clubbing, cyanosis, or edema. Respiratory: Normal respiratory effort, no increased work of breathing. GI: Abdomen is soft, nontender, nondistended, no abdominal masses GU: No CVA tenderness.  Skin: No rashes, bruises or suspicious lesions. Lymph: No cervical or inguinal adenopathy. Neurologic: Grossly intact, no focal deficits, moving all 4 extremities. Psychiatric: Normal mood and affect.  Laboratory Data: Lab Results  Component Value Date   WBC 4.4 10/25/2019   HGB 13.8 10/25/2019   HCT 40.4 10/25/2019   MCV 89 10/25/2019   PLT 236 10/25/2019    Lab Results  Component Value Date    CREATININE 0.86 10/25/2019    No results found for: PSA  No results found for: TESTOSTERONE  Lab Results  Component Value Date   HGBA1C 5.6 07/23/2011    Urinalysis    Component Value Date/Time   COLORURINE YELLOW 08/07/2019 1128   APPEARANCEUR Clear 10/30/2019 1055   LABSPEC 1.010 08/07/2019 1128   PHURINE 6.5 08/07/2019 1128   GLUCOSEU Negative 10/30/2019 1055   HGBUR TRACE (A) 08/07/2019 1128   BILIRUBINUR Negative 10/30/2019 1055   KETONESUR NEGATIVE 08/07/2019 1128   PROTEINUR Negative 10/30/2019 1055   PROTEINUR NEGATIVE 08/07/2019 1128   UROBILINOGEN 0.2 01/13/2018 1147   UROBILINOGEN 0.2 05/02/2013 0930   NITRITE  Negative 10/30/2019 1055   NITRITE NEGATIVE 08/07/2019 1128   LEUKOCYTESUR Negative 10/30/2019 1055   LEUKOCYTESUR MODERATE (A) 08/07/2019 1128    Pertinent Imaging:   Assessment & Plan: Prescription renewed and I will see in 1 year  There are no diagnoses linked to this encounter.  No follow-ups on file.  Reece Packer, MD  [facility] [address] [place], [place] [address] [phone]

## 2020-06-11 ENCOUNTER — Other Ambulatory Visit: Payer: Self-pay | Admitting: Internal Medicine

## 2020-06-11 DIAGNOSIS — E785 Hyperlipidemia, unspecified: Secondary | ICD-10-CM

## 2020-06-17 ENCOUNTER — Ambulatory Visit: Payer: Medicare HMO | Admitting: Gastroenterology

## 2020-07-09 IMAGING — US US RENAL
1 series · 14 of 25 positions shown · non-contrast
Comparison: None.

CLINICAL DATA: Recurrent urinary tract infections.

EXAM:
RENAL / URINARY TRACT ULTRASOUND COMPLETE

[Series 1: us renal · 0.23mm/px · 14 of 38 slices shown]
[im 1/38]
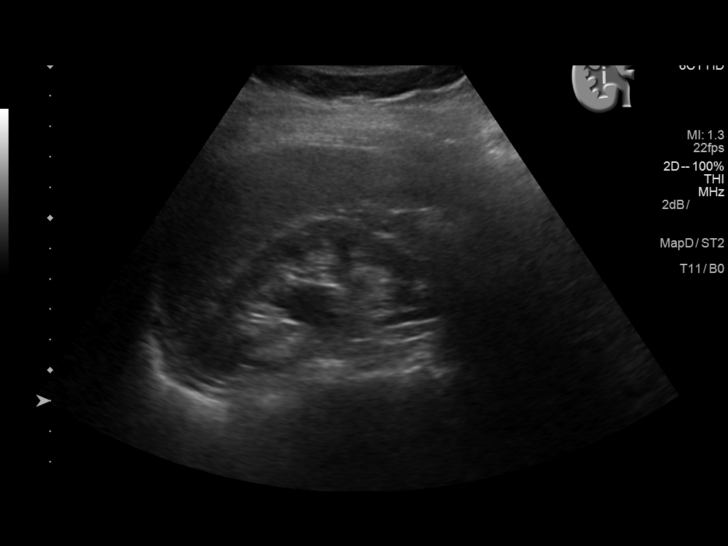
[im 4/38]
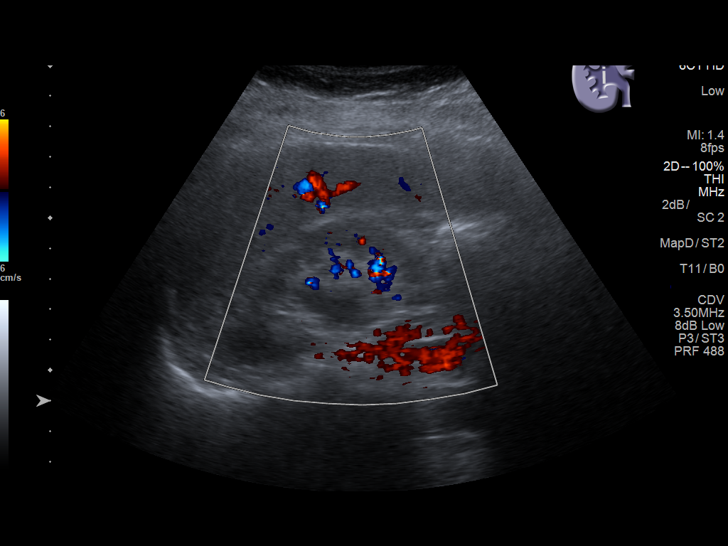
[im 7/38]
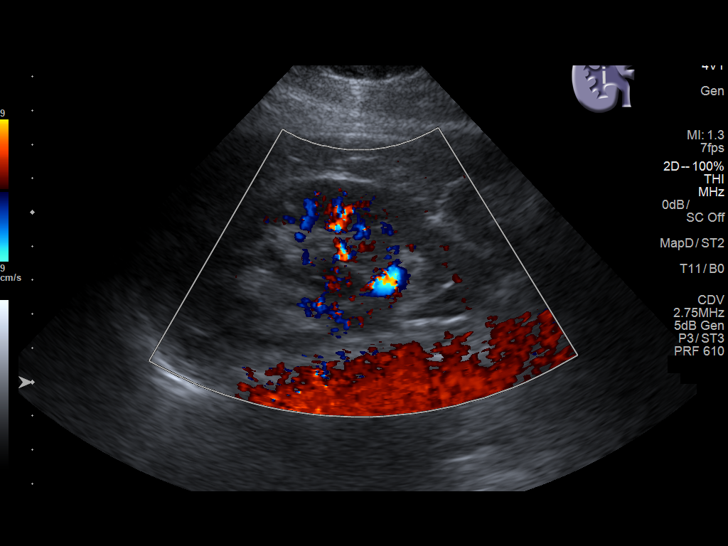
[im 10/38]
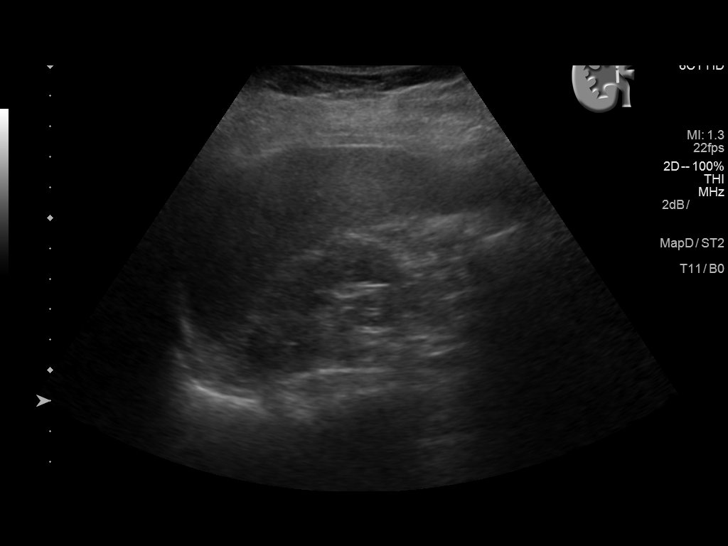
[im 13/38]
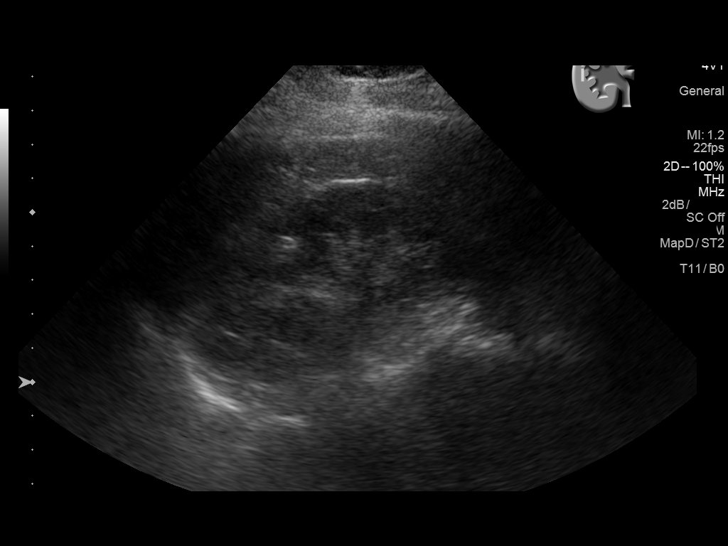
[im 14/38]
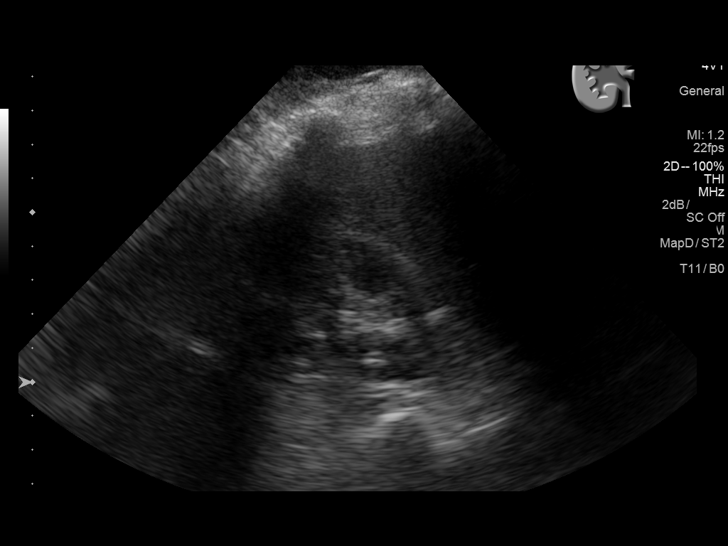
[im 17/38]
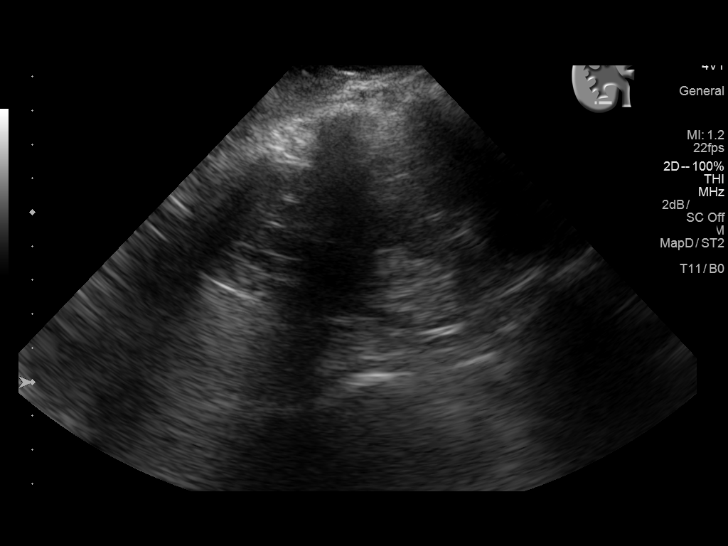
[im 21/38]
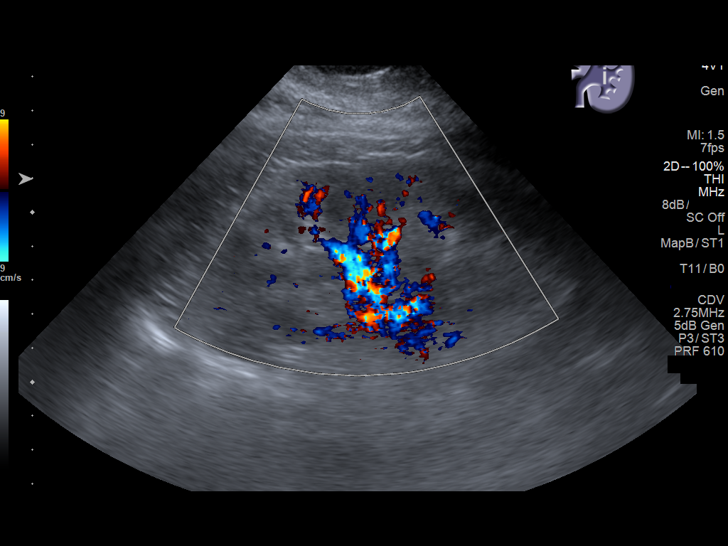
[im 24/38]
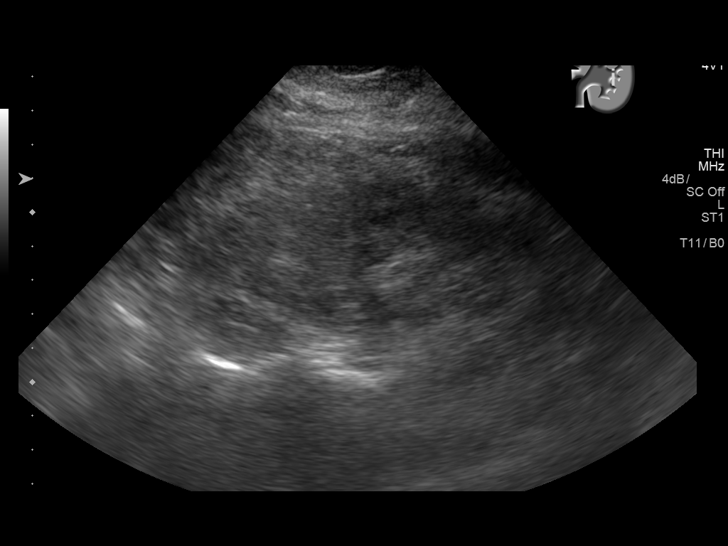
[im 25/38]
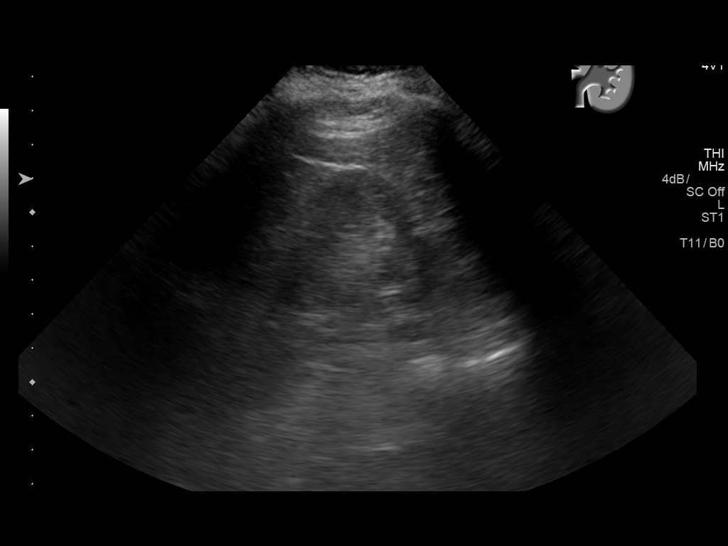
[im 28/38]
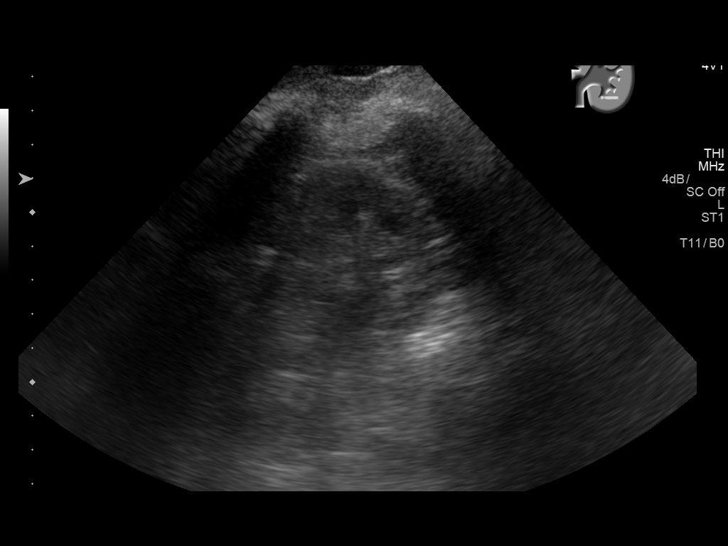
[im 31/38]
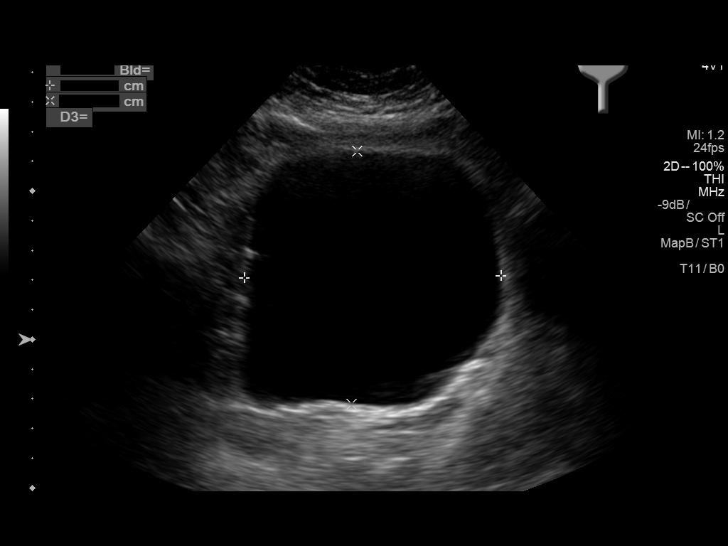
[im 34/38]
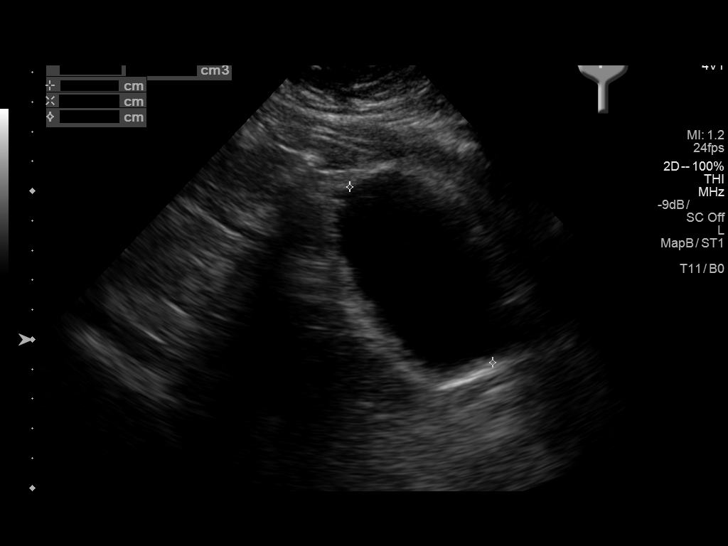
[im 38/38]
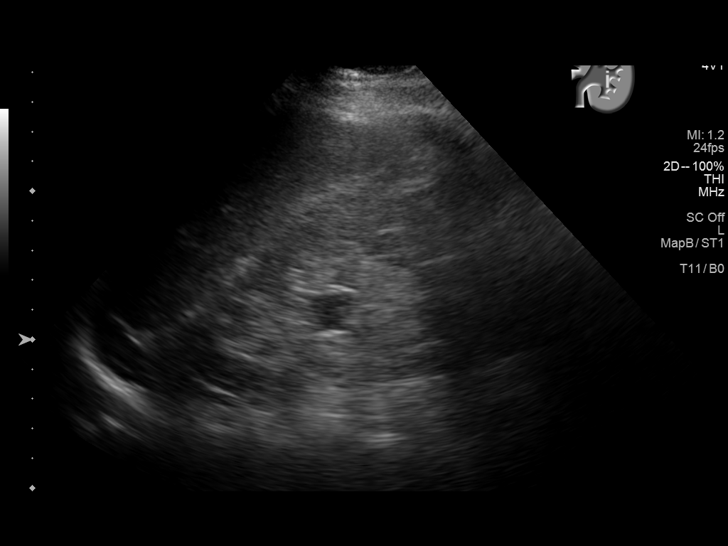

[14 of 25 positions shown; findings below may reference images not displayed]

FINDINGS: Right Kidney:

Length: 9.0 cm, within normal limits.. There is prominence of the
collecting system suggesting mild hydronephrosis. Calices are not
dilated. This may be an extra renal pelvis. Renal parenchyma is
within normal limits.

Left Kidney:

Length: 11.7 cm, within limits. Echogenicity within normal limits.
No mass or hydronephrosis visualized. Extrarenal pelvis is noted.

Bladder:

Appears normal for degree of bladder distention.
IMPRESSION: 1. Mild right-sided hydronephrosis versus extrarenal pelvis. There
is no dilation of the calices.
2. Extrarenal pelvis of the left kidney.
3. No other focal abnormality.

## 2020-07-22 ENCOUNTER — Encounter: Payer: Self-pay | Admitting: Gastroenterology

## 2020-07-22 ENCOUNTER — Ambulatory Visit (INDEPENDENT_AMBULATORY_CARE_PROVIDER_SITE_OTHER): Payer: Medicare HMO | Admitting: Gastroenterology

## 2020-07-22 ENCOUNTER — Other Ambulatory Visit: Payer: Self-pay

## 2020-07-22 ENCOUNTER — Other Ambulatory Visit: Payer: Self-pay | Admitting: Gastroenterology

## 2020-07-22 VITALS — BP 157/73 | HR 50 | Temp 98.9°F | Ht 61.0 in | Wt 135.8 lb

## 2020-07-22 DIAGNOSIS — R0789 Other chest pain: Secondary | ICD-10-CM | POA: Diagnosis not present

## 2020-07-22 DIAGNOSIS — R1011 Right upper quadrant pain: Secondary | ICD-10-CM

## 2020-07-22 MED ORDER — OMEPRAZOLE 40 MG PO CPDR: 40.0000 mg | DELAYED_RELEASE_CAPSULE | Freq: Every day | ORAL | 0 refills | Status: DC

## 2020-07-22 NOTE — Patient Instructions (Addendum)
Please avoid fatty foods at this time.  Please do not eat or drink from midnight the night before.

## 2020-07-22 NOTE — Progress Notes (Signed)
Rebecca Gutierrez [address]  [place]  Rebecca Gutierrez, [place] [address]  Main: [phone]  Fax: [phone]   Gastroenterology Consultation  Referring Provider:     [name], MD Primary Care Physician:  [name], MD Reason for Consultation:     Change in bowel habits        HPI:    Chief Complaint  Patient presents with  . change in bowel habit    Rebecca Gutierrez is a 80 y.o. y/o female referred for consultation & management  by Dr. [name], Jesse Sans, MD.  Patient reports intermittent symptoms of epigastric abdominal pain.  She states she had an episode last night where she started having epigastric abdominal pain associated with nausea, radiating to her back, dull, 5/10.  However, when she woke up this morning the symptoms have completely resolved.  No weight loss.  Last such episode was in August 2021 when she went to the ER at [facility] and was discharged after conservative management.  Right upper quadrant ultrasound done by her PCP as an outpatient, after that visit showed cholelithiasis without cholecystitis.  Hepatic steatosis.  Denies any heartburn, but does report atypical chest pain and cardiology work-up has been negative.  Takes Tums at night  Patient reports 1-2 soft bowel movements a day without diarrhea.  However, when these episodes occur she does notice diarrhea the day of the episode, but not otherwise.  No blood in stool.  When she was into the ER in August, she did have elevation in AST ALT to a 81 and 52 respectively.  Normal alk phos and total bilirubin.  However, I do notice that total bilirubin was elevated mildly to 1.3 in May 2021.  Patient has never had an upper endoscopy.  She does report history of a hiatal hernia.  She has never had an upper endoscopy.  CT abdomen pelvis in 2019-report small hiatal hernia  Last colonoscopy was in March 2013 with Dr. Laural Golden which according to the note was done for average risk screening and states  that "patient's last exam was 11 years ago.  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  Small polyp ablated via cold biopsy from sigmoid colon.  Small external hemorrhoids reported.  Examination performed to cecum.  Cecal withdrawal time 14 minutes.  Excellent prep reported.  Pathology report showed hyperplastic polyp.  10-year follow-up was recommended  Past Medical History:  Diagnosis Date  . Abnormal ECG 10/28/2017  . Anxiety   . Bradycardia 11/10/2017  . Cancer ([place])    skin ca  . Cystocele 09/13/2013   small recurrent, less than before 09/13/13 JVF   . Gilbert's syndrome   . Hearing loss 1975  . History of recurrent urinary tract infection 03/24/2016  . Hyperlipidemia 2000  . Hypertension 2000    Past Surgical History:  Procedure Laterality Date  . ABDOMINAL HYSTERECTOMY  2014   partial  . ANTERIOR AND POSTERIOR REPAIR N/A 05/02/2013   Procedure: ANTERIOR (CYSTOCELE) AND POSTERIOR REPAIR (RECTOCELE);  Surgeon: [name], MD;  Location: [facility];  Service: Gynecology;  Laterality: N/A;  . CATARACT EXTRACTION, BILATERAL    . COLONOSCOPY  08/28/2011   Procedure: COLONOSCOPY;  Surgeon: [name], MD;  [facility];  Service: Endoscopy;  Laterality: N/A;  930  . EYE SURGERY Bilateral 02/13/2011   other 2013  . MOHS SURGERY  06/2016  Removed skin cancer from Rt shin  . TUBAL LIGATION    . VAGINAL HYSTERECTOMY N/A 05/02/2013   Procedure: HYSTERECTOMY VAGINAL;  Surgeon: [name], MD;  Location: [facility];  Service: Gynecology;  Laterality: N/A;    Prior to Admission medications   Medication Sig Start Date End Date Taking? Authorizing Provider  aspirin EC 81 MG tablet Take 81 mg by mouth at bedtime.    Yes [provider]  Cholecalciferol (VITAMIN D HIGH POTENCY PO) Take by mouth.   Yes [provider]  estradiol (ESTRACE VAGINAL) 0.1 MG/GM  vaginal cream Apply 0.50m (pea-sized amount)  just inside the vaginal introitus with a finger-tip on Monday, Wednesday and Friday nights. 09/07/18  Yes McGowan, SLarene BeachA, PA-C  hydrochlorothiazide (HYDRODIURIL) 25 MG tablet TAKE 1 TABLET DAILY 10/03/19  Yes BGlean Hess MD  metoprolol tartrate (LOPRESSOR) 25 MG tablet TAKE ONE AND ONE-HALF TABLETS TWICE A DAY 04/12/20  Yes BGlean Hess MD  nitrofurantoin (FURADANTIN) 25 MG/5ML suspension Take 50 mg by mouth 4 (four) times daily.   Yes [provider]  nitrofurantoin (MACRODANTIN) 100 MG capsule Take 1 capsule (100 mg total) by mouth daily. 05/06/20  Yes MacDiarmid, SNicki Reaper MD  omeprazole (PRILOSEC) 40 MG capsule Take 1 capsule (40 mg total) by mouth daily. 07/22/20  Yes TVonda AntiguaB, MD  ramipril (ALTACE) 10 MG capsule TAKE 1 CAPSULE DAILY 04/12/20  Yes BGlean Hess MD  simvastatin (ZOCOR) 10 MG tablet TAKE 1 TABLET DAILY 06/11/20  Yes BGlean Hess MD    Family History  Problem Relation Age of Onset  . Hypertension Mother   . Cancer Mother        stomach  . Healthy Father   . Healthy Brother   . Colon cancer Neg Hx   . Prostate cancer Neg Hx   . Kidney cancer Neg Hx   . Bladder Cancer Neg Hx   . Breast cancer Neg Hx      Social History   Tobacco Use  . Smoking status: Never Smoker  . Smokeless tobacco: Never Used  . Tobacco comment: smoking cessation materials not required  Vaping Use  . Vaping Use: Never used  Substance Use Topics  . Alcohol use: Yes    Comment: one drink per week  . Drug use: No    Allergies as of 07/22/2020 - Review Complete 07/22/2020  Allergen Reaction Noted  . Ciprofloxacin  05/27/2018  . Levaquin [levofloxacin] Other (See Comments) 05/11/2017  . [name] Diarrhea 06/24/2015  . [name] Palpitations and Rash 06/02/2007    Review of Systems:    All systems reviewed and negative except where noted in HPI.   Physical Exam:  BP (!)  157/73   Pulse (!) 50   Temp 98.9 F (37.2 C) (Oral)   Ht _0  (1.549 m)   Wt 135 lb 12.8 oz (61.6 kg)   BMI 25.66 kg/m  No LMP recorded. Patient has had a hysterectomy. Psych:  Alert and cooperative. Normal mood and affect. General:   Alert,  Well-developed, well-nourished, pleasant and cooperative in NAD Head:  Normocephalic and atraumatic. Eyes:  Sclera clear, no icterus.   Conjunctiva pink. Ears:  Normal auditory acuity. Nose:  No deformity, discharge, or lesions. Mouth:  No deformity or lesions,oropharynx pink & moist. Neck:  Supple; no masses or thyromegaly. Abdomen:  Normal bowel sounds.  No bruits.  Soft, non-tender and non-distended without masses, hepatosplenomegaly or hernias noted.  No guarding or rebound  tenderness.    Msk:  Symmetrical without gross deformities. Good, equal movement & strength bilaterally. Pulses:  Normal pulses noted. Extremities:  No clubbing or edema.  No cyanosis. Neurologic:  Alert and oriented x3;  grossly normal neurologically. Skin:  Intact without significant lesions or rashes. No jaundice. Lymph Nodes:  No significant cervical adenopathy. Psych:  Alert and cooperative. Normal mood and affect.   Labs: CBC    Component Value Date/Time   WBC 4.4 10/25/2019 0922   WBC 3.4 (L) 03/29/2015 0912   RBC 4.54 10/25/2019 0922   RBC 5 01/13/2018 1147   RBC 4.49 03/29/2015 0912   HGB 13.8 10/25/2019 0922   HCT 40.4 10/25/2019 0922   PLT 236 10/25/2019 0922   MCV 89 10/25/2019 0922   MCV 91 10/13/2011 1515   MCH 30.4 10/25/2019 0922   MCH 31.2 03/29/2015 0912   MCHC 34.2 10/25/2019 0922   MCHC 35.6 03/29/2015 0912   RDW 13.2 10/25/2019 0922   RDW 13.3 10/13/2011 1515   LYMPHSABS 0.6 (L) 10/25/2019 0922   MONOABS 0.3 03/29/2015 0912   EOSABS 0.1 10/25/2019 0922   BASOSABS 0.0 10/25/2019 0922   CMP     Component Value Date/Time   NA 140 10/25/2019 0922   NA 142 10/13/2011 1515   K 3.7 10/25/2019 0922   K 3.8 10/13/2011 1515   CL 102  10/25/2019 0922   CL 103 10/13/2011 1515   CO2 23 10/25/2019 0922   CO2 33 (H) 10/13/2011 1515   GLUCOSE 102 (H) 10/25/2019 0922   GLUCOSE 91 09/25/2015 0822   GLUCOSE 113 (H) 10/13/2011 1515   BUN 21 10/25/2019 0922   BUN 16 10/13/2011 1515   CREATININE 0.86 10/25/2019 0922   CREATININE 0.73 09/25/2015 0822   CALCIUM 9.6 10/25/2019 0922   CALCIUM 9.4 10/13/2011 1515   PROT 6.7 10/25/2019 0922   PROT 7.5 10/13/2011 1515   ALBUMIN 4.4 10/25/2019 0922   ALBUMIN 3.9 10/13/2011 1515   AST 20 10/25/2019 0922   AST 21 10/13/2011 1515   ALT 19 10/25/2019 0922   ALT 28 10/13/2011 1515   ALKPHOS 62 10/25/2019 0922   ALKPHOS 74 10/13/2011 1515   BILITOT 1.3 (H) 10/25/2019 0922   BILITOT 1.2 (H) 10/13/2011 1515   GFRNONAA 65 10/25/2019 0922   GFRNONAA 85 03/29/2015 0912   GFRAA 75 10/25/2019 0922   GFRAA >89 03/29/2015 0912    Imaging Studies: No results found.  Assessment and Plan:   KADRA KOHAN is a 80 y.o. y/o female has been referred for change in bowel habits  Patient's main symptoms is intermittent abdominal pain that is associated with nausea, with most recent episode occurring last night  She had severe symptoms like this in August 2021 and right upper quadrant ultrasound at that time only showed cholelithiasis  However, labs also showed elevated transaminases around that time, and an elevated bilirubin was also noted in May 2021  Therefore, will obtain stat labs given that symptoms occurred last night (with complete resolution of symptoms at this time), to evaluate for any elevated liver enzymes at this time which would suggest gallbladder pathology  Repeat right upper quadrant ultrasound this time  Obtain H. pylori breath test  Given the patient is taking Tums every night, start trial of PPI once a day and discontinue in 3 to 4 weeks if no improvement in symptoms  Patient advised to start PPI only after obtaining H. pylori breath test today  (Risks of PPI  use were discussed with patient including bone loss, C. Diff diarrhea, pneumonia, infections, CKD, electrolyte abnormalities.  Pt. Verbalizes understanding and chooses to continue the medication.)  If symptoms improve on PPI, can continue for 1 to 2 months  Consider upper endoscopy if all above work-up negative and no improvement in symptoms  Patient has not had any chronic changes in her bowel habits, but rather just has loose stool when her abdominal pain episodes occur.  Therefore, we will address her abdominal pain and evaluate bowel movements as well with above measures    Dr Rebecca Gutierrez  Speech recognition software was used to dictate the above note.

## 2020-07-23 LAB — COMPREHENSIVE METABOLIC PANEL
ALT: 21 IU/L (ref 0–32)
AST: 23 IU/L (ref 0–40)
Albumin/Globulin Ratio: 1.8 (ref 1.2–2.2)
Albumin: 4.4 g/dL (ref 3.7–4.7)
Alkaline Phosphatase: 65 IU/L (ref 44–121)
BUN/Creatinine Ratio: 23 (ref 12–28)
BUN: 17 mg/dL (ref 8–27)
Bilirubin Total: 0.9 mg/dL (ref 0.0–1.2)
CO2: 27 mmol/L (ref 20–29)
Calcium: 9.6 mg/dL (ref 8.7–10.3)
Chloride: 100 mmol/L (ref 96–106)
Creatinine, Ser: 0.74 mg/dL (ref 0.57–1.00)
GFR calc Af Amer: 90 mL/min/{1.73_m2} (ref >59)
GFR calc non Af Amer: 78 mL/min/{1.73_m2} (ref >59)
Globulin, Total: 2.5 g/dL (ref 1.5–4.5)
Glucose: 113 mg/dL — ABNORMAL HIGH (ref 65–99)
Potassium: 3.5 mmol/L (ref 3.5–5.2)
Sodium: 143 mmol/L (ref 134–144)
Total Protein: 6.9 g/dL (ref 6.0–8.5)

## 2020-07-23 LAB — CBC
Hematocrit: 37.6 % (ref 34.0–46.6)
Hemoglobin: 13.2 g/dL (ref 11.1–15.9)
MCH: 30.9 pg (ref 26.6–33.0)
MCHC: 35.1 g/dL (ref 31.5–35.7)
MCV: 88 fL (ref 79–97)
Platelets: 224 10*3/uL (ref 150–450)
RBC: 4.27 x10E6/uL (ref 3.77–5.28)
RDW: 13 % (ref 11.7–15.4)
WBC: 7.8 10*3/uL (ref 3.4–10.8)

## 2020-07-23 LAB — H. PYLORI BREATH TEST: H pylori Breath Test: NEGATIVE

## 2020-07-26 ENCOUNTER — Ambulatory Visit
Admission: RE | Admit: 2020-07-26 | Discharge: 2020-07-26 | Disposition: A | Discharge status: Home / Self Care | Payer: Medicare HMO | Source: Ambulatory Visit | Attending: Gastroenterology | Admitting: Gastroenterology

## 2020-07-26 ENCOUNTER — Other Ambulatory Visit: Payer: Self-pay

## 2020-07-26 DIAGNOSIS — R1011 Right upper quadrant pain: Secondary | ICD-10-CM | POA: Insufficient documentation

## 2020-07-31 ENCOUNTER — Telehealth: Payer: Self-pay | Admitting: Gastroenterology

## 2020-07-31 NOTE — Telephone Encounter (Signed)
Please call patient to answer her questions regarding lomeprazole (PRILOSEC) 40 MG capsulel patient.

## 2020-08-01 ENCOUNTER — Other Ambulatory Visit: Payer: Self-pay | Admitting: Gastroenterology

## 2020-08-01 DIAGNOSIS — R1011 Right upper quadrant pain: Secondary | ICD-10-CM

## 2020-08-01 NOTE — Telephone Encounter (Signed)
Patient states she does not have burning in throat or acid reflex. Patient states she has a hiatal hernia. She states at night sometime she will have some bubbling in her stomach and will take a Tums. She states she does not take a tum at night every day. She states she is not having RUQ abdominal pain. She states she read the side effects and does not feel like she needs it. She tries to watch what she eats and does not have any problems. Patient wants to know if she really needs the medication. Patient has a follow up appointment on 08/19/2020

## 2020-08-19 ENCOUNTER — Other Ambulatory Visit: Payer: Self-pay

## 2020-08-19 ENCOUNTER — Encounter: Payer: Self-pay | Admitting: Gastroenterology

## 2020-08-19 ENCOUNTER — Ambulatory Visit (INDEPENDENT_AMBULATORY_CARE_PROVIDER_SITE_OTHER): Payer: Medicare HMO | Admitting: Gastroenterology

## 2020-08-19 VITALS — BP 138/72 | HR 98 | Temp 97.8°F | Ht 61.0 in | Wt 133.0 lb

## 2020-08-19 DIAGNOSIS — R195 Other fecal abnormalities: Secondary | ICD-10-CM | POA: Diagnosis not present

## 2020-08-19 DIAGNOSIS — K219 Gastro-esophageal reflux disease without esophagitis: Secondary | ICD-10-CM

## 2020-08-19 DIAGNOSIS — R748 Abnormal levels of other serum enzymes: Secondary | ICD-10-CM | POA: Diagnosis not present

## 2020-08-19 NOTE — Telephone Encounter (Signed)
Patient was seen today.

## 2020-08-19 NOTE — Progress Notes (Signed)
Rebecca Antigua, MD [address]  [place]  [place], [place] [address]  Main: [phone]  Fax: [phone]   Primary Care Physician: [name], MD   Chief Complaint  Patient presents with  . Abdominal Pain    HPI: Rebecca Gutierrez is a 80 y.o. female here for follow-up of abdominal pain.  Patient states abdominal pain has completely resolved since her visit with me.  Her H. pylori breath test was negative and we had prescribed PPI but patient never took this as she states his her symptoms have resolved.  She states she gets very occasional heartburn, mostly in the evening, and takes Tums, very occasionally, maybe once or twice a month.  No dysphagia.  No nausea or vomiting.  No weight loss.  She states she drinks coffee every morning and needs to have 1 bowel movement a day after coffee and now is having 2-3 loose bowel movements after her cup of coffee, and no others after that.  No blood in stool.  Previous history: in August 2021 she went to the ER at [facility] for abdominal pain and was discharged after conservative management.  Right upper quadrant ultrasound done by her PCP as an outpatient, after that visit showed cholelithiasis without cholecystitis.  Hepatic steatosis.  Denies any heartburn, but does report atypical chest pain and cardiology work-up has been negative.  Takes Tums at night  When she was into the ER in August, she did have elevation in AST ALT to a 81 and 52 respectively.  Normal alk phos and total bilirubin.  However, I do notice that total bilirubin was elevated mildly to 1.3 in May 2021.  Patient has never had an upper endoscopy.  She does report history of a hiatal hernia.  She has never had an upper endoscopy.  CT abdomen pelvis in 2019-report small hiatal hernia  Last colonoscopy was in March 2013 with Dr. Laural Golden which according to the note was done for average risk screening and states that "patient's last exam was 11 years ago.  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  Small polyp ablated via cold biopsy from sigmoid colon.  Small external hemorrhoids reported.  Examination performed to cecum.  Cecal withdrawal time 14 minutes.  Excellent prep reported.  Pathology report showed hyperplastic polyp.  10-year follow-up was recommended  Current Outpatient Medications  Medication Sig Dispense Refill  . aspirin EC 81 MG tablet Take 81 mg by mouth at bedtime.     . Cholecalciferol (VITAMIN D HIGH POTENCY PO) Take by mouth.    . estradiol (ESTRACE VAGINAL) 0.1 MG/GM vaginal cream Apply 0.45m (pea-sized amount)  just inside the vaginal introitus with a finger-tip on Monday, Wednesday and Friday nights. 30 g 12  . hydrochlorothiazide (HYDRODIURIL) 25 MG tablet TAKE 1 TABLET DAILY 90 tablet 3  . metoprolol tartrate (LOPRESSOR) 25 MG tablet TAKE ONE AND ONE-HALF TABLETS TWICE A DAY 270 tablet 1  . nitrofurantoin (FURADANTIN) 25 MG/5ML suspension Take 50 mg by mouth 4 (four) times daily.    . nitrofurantoin (MACRODANTIN) 100 MG capsule Take 1 capsule (100 mg total) by mouth daily. 90 capsule 3  . ramipril (ALTACE) 10 MG capsule TAKE 1 CAPSULE DAILY 90 capsule 1  . simvastatin (ZOCOR) 10 MG tablet TAKE 1 TABLET DAILY 90 tablet 1  . omeprazole (PRILOSEC) 40 MG capsule Take 1 capsule (40 mg total) by mouth  daily. (Patient not taking: Reported on 08/19/2020) 30 capsule 0   No current facility-administered medications for this visit.    Allergies as of 08/19/2020 - Review Complete 08/19/2020  Allergen Reaction Noted  . Ciprofloxacin  05/27/2018  . Levaquin [levofloxacin] Other (See Comments) 05/11/2017  . [name] Diarrhea 06/24/2015  . [name] Palpitations and Rash 06/02/2007    ROS:  General: Negative for anorexia, weight loss, fever, chills, fatigue, weakness. ENT: Negative for hoarseness,  difficulty swallowing , nasal congestion. CV: Negative for chest pain, angina, palpitations, dyspnea on exertion, peripheral edema.  Respiratory: Negative for dyspnea at rest, dyspnea on exertion, cough, sputum, wheezing.  GI: See history of present illness. GU:  Negative for dysuria, hematuria, urinary incontinence, urinary frequency, nocturnal urination.  Endo: Negative for unusual weight change.    Physical Examination:   BP 138/72   Pulse 98   Temp 97.8 F (36.6 C) (Oral)   Ht _0  (1.549 m)   Wt 133 lb (60.3 kg)   BMI 25.13 kg/m   General: Well-nourished, well-developed in no acute distress.  Eyes: No icterus. Conjunctivae pink. Mouth: Oropharyngeal mucosa moist and pink , no lesions erythema or exudate. Neck: Supple, Trachea midline Abdomen: Bowel sounds are normal, nontender, nondistended, no hepatosplenomegaly or masses, no abdominal bruits or hernia , no rebound or guarding.   Extremities: No lower extremity edema. No clubbing or deformities. Neuro: Alert and oriented x 3.  Grossly intact. Skin: Warm and dry, no jaundice.   Psych: Alert and cooperative, normal mood and affect.   Labs: CMP     Component Value Date/Time   NA 143 07/22/2020 1153   NA 142 10/13/2011 1515   K 3.5 07/22/2020 1153   K 3.8 10/13/2011 1515   CL 100 07/22/2020 1153   CL 103 10/13/2011 1515   CO2 27 07/22/2020 1153   CO2 33 (H) 10/13/2011 1515   GLUCOSE 113 (H) 07/22/2020 1153   GLUCOSE 91 09/25/2015 0822   GLUCOSE 113 (H) 10/13/2011 1515   BUN 17 07/22/2020 1153   BUN 16 10/13/2011 1515   CREATININE 0.74 07/22/2020 1153   CREATININE 0.73 09/25/2015 0822   CALCIUM 9.6 07/22/2020 1153   CALCIUM 9.4 10/13/2011 1515   PROT 6.9 07/22/2020 1153   PROT 7.5 10/13/2011 1515   ALBUMIN 4.4 07/22/2020 1153   ALBUMIN 3.9 10/13/2011 1515   AST 23 07/22/2020 1153   AST 21 10/13/2011 1515   ALT 21 07/22/2020 1153   ALT 28 10/13/2011 1515   ALKPHOS 65 07/22/2020 1153   ALKPHOS 74 10/13/2011  1515   BILITOT 0.9 07/22/2020 1153   BILITOT 1.2 (H) 10/13/2011 1515   GFRNONAA 78 07/22/2020 1153   GFRNONAA 85 03/29/2015 0912   GFRAA 90 07/22/2020 1153   GFRAA >89 03/29/2015 0912   Lab Results  Component Value Date   WBC 7.8 07/22/2020   HGB 13.2 07/22/2020   HCT 37.6 07/22/2020   MCV 88 07/22/2020   PLT 224 07/22/2020    Imaging Studies: US ABDOMEN LIMITED RUQ (LIVER/GB)  Result Date: 07/26/2020 CLINICAL DATA:  Right upper quadrant abdominal pain for the last week. EXAM: ULTRASOUND ABDOMEN LIMITED RIGHT UPPER QUADRANT COMPARISON:  Right upper quadrant ultrasound dated October 09, 2019. FINDINGS: Gallbladder: The gallbladder remains filled with gallstones and sludge. No wall thickening visualized. No sonographic Murphy sign noted by sonographer. Common bile duct: Diameter: 4 mm, normal. Liver: No focal lesion identified. Unchanged diffusely increased parenchymal echogenicity. Portal vein is patent on color Doppler imaging  with normal direction of blood flow towards the liver. Other: None. IMPRESSION: 1. Cholelithiasis and sludge without sonographic evidence of acute cholecystitis. 2. Unchanged hepatic steatosis. Electronically Signed   By: Titus Dubin M.D.   On: 07/26/2020 15:55    Assessment and Plan:   BONNIEJEAN PIANO is a 80 y.o. y/o female here for follow-up of abdominal pain  Abdominal pain has completely resolved Last ultrasound shows cholelithiasis and sludge without cholecystitis  Liver enzymes were completely normal when checked on last visit  She reports heartburn very occasionally which resolves with Tums  Her main complaint is a 2-3 loose bowel movements that she has immediately after her cup of coffee in the morning.  I have encouraged her to decrease caffeine intake as this may be causing her symptoms.  However, she can also start Metamucil once a day to help bulk stool and allow for a larger more formed bowel movement at 1 time instead of 2-3 bowel movements  she is having  However, if symptoms do not resolve, may need to consider stool testing or other tests  If abdominal pain reoccurs, patient advised to have labs done at the time of the pain and standing order for liver enzymes has been placed  Return to clinic if symptoms recur, clinic appointment made in the next 3 to 4 months, but sooner if symptoms recur  Patient educated extensively on acid reflux lifestyle modification, including buying a bed wedge, not eating 3 hrs before bedtime, diet modifications, and handout given for the same.   Since heartburn symptoms are occasional, and abdominal pain has resolved patient does not want to try short-term PPI which was previously ordered  As long as symptoms remain occasional and she is only using Tums once or twice a month as necessary, continue the same along with acid reflux lifestyle measures.  If symptoms become more frequent, patient advised to call us  Dr Rebecca Gutierrez

## 2020-08-19 NOTE — Patient Instructions (Addendum)
Please go to the lab when you are feeling the abdominal pain.   Please take Metamici i

## 2020-09-27 ENCOUNTER — Other Ambulatory Visit: Payer: Self-pay | Admitting: Internal Medicine

## 2020-10-09 ENCOUNTER — Other Ambulatory Visit: Payer: Self-pay | Admitting: Internal Medicine

## 2020-10-09 DIAGNOSIS — I10 Essential (primary) hypertension: Secondary | ICD-10-CM

## 2020-10-09 DIAGNOSIS — R002 Palpitations: Secondary | ICD-10-CM

## 2020-10-09 NOTE — Telephone Encounter (Signed)
Requested Prescriptions  Pending Prescriptions Disp Refills  . metoprolol tartrate (LOPRESSOR) 25 MG tablet [Pharmacy Med Name: METOPROLOL TARTRATE TABS 25MG ] 270 tablet 0    Sig: TAKE ONE AND ONE-HALF TABLETS TWICE A DAY     Cardiovascular:  Beta Blockers Passed - 10/09/2020  1:01 AM      Passed - Last BP in normal range    BP Readings from Last 1 Encounters:  08/19/20 138/72         Passed - Last Heart Rate in normal range    Pulse Readings from Last 1 Encounters:  08/19/20 98         Passed - Valid encounter within last 6 months    Recent Outpatient Visits          5 months ago Essential hypertension, benign   [facility] [name], MD   8 months ago Epigastric pain   [facility] [name], MD   11 months ago Annual physical exam   [facility] [name], MD   1 year ago Pain of left upper extremity   [facility] [name], MD   1 year ago Hyperlipidemia LDL goal <100   [facility] [name], MD      Future Appointments            In 2 weeks [name] Jesse Sans, MD [facility] [place]   In 2 months Virgel Manifold, MD [place]   In 7 months MacDiarmid, Nicki Reaper, MD [place]           . ramipril (ALTACE) 10 MG capsule [Pharmacy Med Name: RAMIPRIL CAPS 10MG ] 90 capsule 0    Sig: TAKE 1 CAPSULE DAILY     Cardiovascular:  ACE Inhibitors Passed - 10/09/2020  1:01 AM      Passed - Cr in normal range and within 180 days    Creat  Date Value Ref Range Status  09/25/2015 0.73 0.60 - 0.93 mg/dL Final   Creatinine, Ser  Date Value Ref Range Status  07/22/2020 0.74 0.57 - 1.00 mg/dL Final         Passed - K in normal range and within 180 days    Potassium  Date Value Ref Range Status  07/22/2020 3.5 3.5 - 5.2 mmol/L Final  10/13/2011 3.8 3.5 - 5.1 mmol/L Final         Passed - Patient is not pregnant      Passed - Last BP in normal  range    BP Readings from Last 1 Encounters:  08/19/20 138/72         Passed - Valid encounter within last 6 months    Recent Outpatient Visits          5 months ago Essential hypertension, benign   [facility] [name], MD   8 months ago Epigastric pain   [facility] [name], MD   11 months ago Annual physical exam   [facility] [name], MD   1 year ago Pain of left upper extremity   [facility] [name], MD   1 year ago Hyperlipidemia LDL goal <100   [facility] [name], MD      Future Appointments            In 2 weeks [name] Jesse Sans, MD [facility], [place]   In  2 months Virgel Manifold, MD [facility] [place]   In 7 months MacDiarmid, Nicki Reaper, MD [place]

## 2020-10-15 HISTORY — PX: CHOLECYSTECTOMY: SHX55

## 2020-10-21 ENCOUNTER — Ambulatory Visit (INDEPENDENT_AMBULATORY_CARE_PROVIDER_SITE_OTHER): Payer: Medicare HMO

## 2020-10-21 ENCOUNTER — Telehealth: Payer: Self-pay

## 2020-10-21 DIAGNOSIS — Z780 Asymptomatic menopausal state: Secondary | ICD-10-CM

## 2020-10-21 DIAGNOSIS — Z0000 Encounter for general adult medical examination without abnormal findings: Secondary | ICD-10-CM | POA: Diagnosis not present

## 2020-10-21 DIAGNOSIS — Z1231 Encounter for screening mammogram for malignant neoplasm of breast: Secondary | ICD-10-CM

## 2020-10-21 NOTE — Telephone Encounter (Signed)
Transition Care Management Follow-up Telephone Call  Date of discharge and from where: 10/18/20 [facility]  How have you been since you were released from the hospital? Pt states she is doing okay s/p open cholecystectomy. Awaiting staple and catheter removal.   Any questions or concerns? No  Items Reviewed:  Did the pt receive and understand the discharge instructions provided? Yes   Medications obtained and verified? Yes   Other? No   Any new allergies since your discharge? No   Dietary orders reviewed? Yes  Do you have support at home? Yes   Home Care and Equipment/Supplies: Were home health services ordered? no Were any new equipment or medical supplies ordered?  No - pt already had walker   Functional Questionnaire: (I = Independent and D = Dependent) ADLs: I  Bathing/Dressing- I  Meal Prep- I  Eating- I  Maintaining continence- I  Transferring/Ambulation- I with walker  Managing Meds- I  Follow up appointments reviewed:   PCP Hospital f/u appt confirmed? Yes  Scheduled to see Dr. [name] on 10/28/20 for CPE.  [facility] f/u appt confirmed? No  pt awaiting appt.  Are transportation arrangements needed? No   If their condition worsens, is the pt aware to call PCP or go to the Emergency Dept.? Yes  Was the patient provided with contact information for the PCP's office or ED? Yes  Was to pt encouraged to call back with questions or concerns? Yes

## 2020-10-21 NOTE — Progress Notes (Signed)
Subjective:   Rebecca Gutierrez is a 80 y.o. female who presents for Medicare Annual (Subsequent) preventive examination.  Virtual Visit via Telephone Note  I connected with  Rebecca Gutierrez on 10/21/20 at  2:00 PM EDT by telephone and verified that I am speaking with the correct person using two identifiers.  Location: Patient: home Provider: [facility] Persons participating in the virtual visit: [place]   I discussed the limitations, risks, security and privacy concerns of performing an evaluation and management service by telephone and the availability of in person appointments. The patient expressed understanding and agreed to proceed.  Interactive audio and video telecommunications were attempted between this nurse and patient, however failed, due to patient having technical difficulties OR patient did not have access to video capability.  We continued and completed visit with audio only.  Some vital signs may be absent or patient reported.   [name], LPN    Review of Systems     Cardiac Risk Factors include: advanced age (>66men, >23 women);dyslipidemia;hypertension     Objective:    Today's Vitals   10/21/20 1413  PainSc: 7    There is no height or weight on file to calculate BMI.  Advanced Directives 10/21/2020 10/18/2019 10/10/2018 10/04/2017 09/29/2016 02/10/2016 05/02/2013  Does Patient Have a Medical Advance Directive? Yes Yes Yes Yes Yes Yes Patient has advance directive, copy not in chart  Type of Advance Directive [place];Living will [place];Living will Living will;Healthcare Power of [place];Living will Healthcare Power of Attorney Living will;Healthcare Power of [place];Living will  Does patient want to make changes to medical advance directive? - - - - - - No  Copy of [place] in Chart? No - copy requested No - copy  requested No - copy requested No - copy requested No - copy requested - Copy requested from family  Pre-existing out of facility DNR order (yellow form or pink MOST form) - - - - - - No    Current Medications (verified) Outpatient Encounter Medications as of 10/21/2020  Medication Sig  . acetaminophen (TYLENOL) 325 MG tablet Take by mouth.  Marland Kitchen aspirin EC 81 MG tablet Take 81 mg by mouth at bedtime.   . Cholecalciferol 50 MCG (2000 UT) TABS Take 1 capsule by mouth in the morning.  Marland Kitchen estradiol (ESTRACE VAGINAL) 0.1 MG/GM vaginal cream Apply 0.5mg  (pea-sized amount)  just inside the vaginal introitus with a finger-tip on Monday, Wednesday and Friday nights.  . hydrochlorothiazide (HYDRODIURIL) 25 MG tablet TAKE 1 TABLET DAILY  . metoprolol tartrate (LOPRESSOR) 25 MG tablet TAKE ONE AND ONE-HALF TABLETS TWICE A DAY  . nitrofurantoin (MACRODANTIN) 100 MG capsule Take 1 capsule (100 mg total) by mouth daily.  . ramipril (ALTACE) 10 MG capsule TAKE 1 CAPSULE DAILY  . simvastatin (ZOCOR) 10 MG tablet TAKE 1 TABLET DAILY  . tamsulosin (FLOMAX) 0.4 MG CAPS capsule Take 0.4 mg by mouth daily. For 14 days  . omeprazole (PRILOSEC) 40 MG capsule Take 1 capsule (40 mg total) by mouth daily. (Patient not taking: Reported on 10/21/2020)  . [DISCONTINUED] Cholecalciferol (VITAMIN D HIGH POTENCY PO) Take by mouth.  . [DISCONTINUED] nitrofurantoin (FURADANTIN) 25 MG/5ML suspension Take 50 mg by mouth 4 (four) times daily.   No facility-administered encounter medications on file as of 10/21/2020.    Allergies (verified) Ciprofloxacin, Levaquin [levofloxacin], [name], and [name]   History: Past Medical History:  Diagnosis Date  . Abnormal ECG 10/28/2017  . Anxiety   . Bradycardia 11/10/2017  . Cancer ([place])    skin ca  . Cystocele 09/13/2013   small recurrent, less than before 09/13/13 JVF   . Gilbert's syndrome   . Hearing loss 1975  . History of recurrent urinary tract  infection 03/24/2016  . Hyperlipidemia 2000  . Hypertension 2000   Past Surgical History:  Procedure Laterality Date  . ABDOMINAL HYSTERECTOMY  2014   partial  . ANTERIOR AND POSTERIOR REPAIR N/A 05/02/2013   Procedure: ANTERIOR (CYSTOCELE) AND POSTERIOR REPAIR (RECTOCELE);  Surgeon: [name], MD;  Location: [facility]S;  Service: Gynecology;  Laterality: N/A;  . CATARACT EXTRACTION, BILATERAL    . COLONOSCOPY  08/28/2011   Procedure: COLONOSCOPY;  Surgeon: [name], MD;  Location: [facility] SUITE;  Service: Endoscopy;  Laterality: N/A;  930  . EYE SURGERY Bilateral 02/13/2011   other 2013  . 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 TUBAL LIGATION    . VAGINAL HYSTERECTOMY N/A 05/02/2013   Procedure: HYSTERECTOMY VAGINAL;  Surgeon: [name], MD;  Location: [facility];  Service: Gynecology;  Laterality: N/A;   Family History  Problem Relation Age of Onset  . Hypertension Mother   . Cancer Mother        stomach  . Healthy Father   . Healthy Brother   . Colon cancer Neg Hx   . Prostate cancer Neg Hx   . Kidney cancer Neg Hx   . Bladder Cancer Neg Hx   . Breast cancer Neg Hx    Social History   Socioeconomic History  . Marital status: Widowed    Spouse name: Not on file  . Number of children: 2  . Years of education: Not on file  . Highest education level: 12th grade  Occupational History  . Occupation: Retired  Tobacco Use  . Smoking status: Never Smoker  . Smokeless tobacco: Never Used  . Tobacco comment: smoking cessation materials not required  Vaping Use  . Vaping Use: Never used  Substance and Sexual Activity  . Alcohol use: Yes    Comment: one drink per week  . Drug use: No  . Sexual activity: Never    Birth control/protection: Post-menopausal, Surgical  Other Topics Concern  . Not on file  Social History Narrative  . Not on file   Social Determinants of Health   Financial Resource Strain: Low Risk   . Difficulty of Paying  Living Expenses: Not hard at all  Food Insecurity: No Food Insecurity  . Worried About Charity fundraiser in the Last Year: Never true  . Ran Out of Food in the Last Year: Never true  Transportation Needs: No Transportation Needs  . Lack of Transportation (Medical): No  . Lack of Transportation (Non-Medical): No  Physical Activity: Insufficiently Active  . Days of Exercise per Week: 3 days  . Minutes of Exercise per Session: 30 min  Stress: No Stress Concern Present  . Feeling of Stress : Not at all  Social Connections: Socially Isolated  . Frequency of Communication with Friends and Family: More than three times a week  . Frequency of Social Gatherings with Friends and Family: Three times a week  . Attends Religious Services: Never  . Active Member of Clubs or Organizations: No  . Attends Archivist Meetings: Never  . Marital Status: Widowed    Tobacco Counseling Counseling  given: Not Answered Comment: smoking cessation materials not required   Clinical Intake:  Pre-visit preparation completed: Yes  Pain : 0-10 Pain Score: 7  Pain Type: Acute pain (post op) Pain Location: Abdomen Pain Descriptors / Indicators: Sore Pain Onset: In the past 7 days Pain Frequency: Constant     Nutritional Risks: Nausea/ vomitting/ diarrhea (s/p gall bladder surgery) Diabetes: No  How often do you need to have someone help you when you read instructions, pamphlets, or other written materials from your doctor or pharmacy?: 1 - Never    Interpreter Needed?: No  Information entered by :: [name] LPN   Activities of Daily Living In your present state of health, do you have any difficulty performing the following activities: 10/21/2020  Hearing? Y  Comment wears hearing aids  Vision? N  Difficulty concentrating or making decisions? N  Walking or climbing stairs? Y  Dressing or bathing? N  Doing errands, shopping? N  Preparing Food and eating ? N  Using the Toilet? N   In the past six months, have you accidently leaked urine? Y  Do you have problems with loss of bowel control? N  Managing your Medications? N  Managing your Finances? N  Housekeeping or managing your Housekeeping? N  Some recent data might be hidden    Patient Care Team: [name], MD as PCP - General (Internal Medicine) Jannet Mantis, MD (Dermatology) Hollice Espy, MD as Consulting Physician (Urology) Gae Dry, MD as Referring Physician (Obstetrics and Gynecology) Corey Skains, MD as Consulting Physician (Cardiology)  Indicate any recent Medical Services you may have received from other than [facility] providers in the past year (date may be approximate).     Assessment:   This is a routine wellness examination for [place].  Hearing/Vision screen  Hearing Screening   125Hz  250Hz  500Hz  1000Hz  2000Hz  3000Hz  4000Hz  6000Hz  8000Hz   Right ear:           Left ear:           Comments: Pt wears hearing aids maintained by [facility]   Vision Screening Comments: Annual vision screenings done by [facility]  Dietary issues and exercise activities discussed: Current Exercise Habits: Home exercise routine, Type of exercise: walking, Time (Minutes): 30, Frequency (Times/Week): 3, Weekly Exercise (Minutes/Week): 90, Intensity: Mild, Exercise limited by: None identified  Goals Addressed            This Visit's Progress   . DIET - INCREASE WATER INTAKE   On track    Recommend to drink at least 6-8 8oz glasses of water per day.      Depression Screen PHQ 2/9 Scores 10/21/2020 04/29/2020 02/02/2020 10/25/2019 10/18/2019 10/02/2019 04/18/2019  PHQ - 2 Score 0 0 0 0 0 0 0  PHQ- 9 Score - 0 0 0 - 0 -    Fall Risk Fall Risk  10/21/2020 04/29/2020 10/25/2019 10/18/2019 10/02/2019  Falls in the past year? 0 1 0 0 0  Comment - - - - -  Number falls in past yr: 0 0 0 0 0  Injury with Fall? 0 0 0 0 0  Risk for fall due to : Impaired balance/gait - No Fall Risks No Fall Risks No  Fall Risks  Risk for fall due to: Comment - - - - -  Follow up Falls prevention discussed Falls evaluation completed Falls evaluation completed Falls prevention discussed Falls evaluation completed    [place]:  Any  stairs in or around the home? No  If so, are there any without handrails? No  Home free of loose throw rugs in walkways, pet beds, electrical cords, etc? Yes  Adequate lighting in your home to reduce risk of falls? Yes   ASSISTIVE DEVICES UTILIZED TO PREVENT FALLS:  Life alert? No  Use of a cane, walker or w/c? Yes  Grab bars in the bathroom? Yes  Shower chair or bench in shower? Yes  Elevated toilet seat or a handicapped toilet? No   TIMED UP AND GO:  Was the test performed? No . Telephonic visit.   Cognitive Function: Normal cognitive status assessed by direct observation by this Nurse Health Advisor. No abnormalities found.       6CIT Screen 10/10/2018 10/04/2017 09/29/2016  What Year? 0 points 0 points 0 points  What month? 0 points 0 points 0 points  What time? 0 points 0 points 0 points  Count back from 20 0 points 0 points 0 points  Months in reverse 0 points 0 points 0 points  Repeat phrase 0 points 0 points 0 points  Total Score 0 0 0    Immunizations Immunization History  Administered Date(s) Administered  . Fluad Quad(high Dose 65+) 04/18/2019, 04/29/2020  . Influenza Split 04/01/2015  . Influenza Whole 03/25/2006, 05/25/2011  . Influenza, High Dose Seasonal PF 03/31/2017, 04/22/2018  . Influenza,inj,Quad PF,6+ Mos 03/01/2013, 03/14/2014, 03/24/2016  . PFIZER(Purple Top)SARS-COV-2 Vaccination 06/29/2019, 07/20/2019, 04/03/2020  . Pneumococcal Conjugate-13 09/17/2014  . Pneumococcal Polysaccharide-23 09/23/2009  . Td 02/04/2004, 03/14/2014  . Zoster 07/17/2010  . Zoster Recombinat (Shingrix) 01/09/2019, 05/18/2019    TDAP status: Up to date  Flu Vaccine status: Up to date  Pneumococcal vaccine status: Up  to date  Covid-19 vaccine status: Completed vaccines  Qualifies for Shingles Vaccine? Yes   Zostavax completed Yes   Shingrix Completed?: Yes  Screening Tests Health Maintenance  Topic Date Due  . Hepatitis C Screening  Never done  . COVID-19 Vaccine (4 - Booster for Pfizer series) 10/02/2020  . MAMMOGRAM  12/18/2020  . INFLUENZA VACCINE  01/13/2021  . TETANUS/TDAP  03/14/2024  . DEXA SCAN  Completed  . PNA vac Low Risk Adult  Completed  . HPV VACCINES  Aged Out    Health Maintenance  Health Maintenance Due  Topic Date Due  . Hepatitis C Screening  Never done  . COVID-19 Vaccine (4 - Booster for Pfizer series) 10/02/2020    Colorectal cancer screening: No longer required.   Mammogram status: Completed 12/19/19. Repeat every year  Bone Density status: Completed 12/13/18. Results reflect: Bone density results: OSTEOPENIA. Repeat every 2 years.  Lung Cancer Screening: (Low Dose CT Chest recommended if Age 31-80 years, 30 pack-year currently smoking OR have quit w/in 15years.) does not qualify.   Additional Screening:  Hepatitis C Screening: does qualify; postponed  Vision Screening: Recommended annual ophthalmology exams for early detection of glaucoma and other disorders of the eye. Is the patient up to date with their annual eye exam?  Yes  Who is the provider or what is the name of the office in which the patient attends annual eye exams? [facility].   Dental Screening: Recommended annual dental exams for proper oral hygiene  Community Resource Referral / Chronic Care Management: CRR required this visit?  No   CCM required this visit?  No      Plan:     I have personally reviewed and noted the following in the patient's chart:   .  Medical and social history . Use of alcohol, tobacco or illicit drugs  . Current medications and supplements including opioid prescriptions.  . Functional ability and status . Nutritional status . Physical activity . Advanced  directives . List of other physicians . Hospitalizations, surgeries, and ER visits in previous 12 months . Vitals . Screenings to include cognitive, depression, and falls . Referrals and appointments  In addition, I have reviewed and discussed with patient certain preventive protocols, quality metrics, and best practice recommendations. A written personalized care plan for preventive services as well as general preventive health recommendations were provided to patient.     [name], LPN   11/21/4501   Nurse Notes: pt s/p open cholecystectomy 10/15/20 at [facility] See telephone note for TCM call.

## 2020-10-21 NOTE — Patient Instructions (Signed)
Rebecca Gutierrez , Thank you for taking time to come for your Medicare Wellness Visit. I appreciate your ongoing commitment to your health goals. Please review the following plan we discussed and let me know if I can assist you in the future.   Screening recommendations/referrals: Colonoscopy: no longer required Mammogram: done 12/19/19. Please call [phone] to schedule your mammogram and bone density screening.  Bone Density: done 12/13/18 Recommended yearly ophthalmology/optometry visit for glaucoma screening and checkup Recommended yearly dental visit for hygiene and checkup  Vaccinations: Influenza vaccine: done 04/29/20 Pneumococcal vaccine: done 09/17/14 Tdap vaccine: done 03/14/14 Shingles vaccine: done 01/09/19 & 05/18/19   Covid-19:done 06/29/19, 07/20/19 & 04/03/20  Advanced directives: Please bring a copy of your health care power of attorney and living will to the office at your convenience.  Conditions/risks identified: Recommend continuing fall prevention in the home.   Next appointment: Follow up in one year for your annual wellness visit    Preventive Care 65 Years and Older, Female Preventive care refers to lifestyle choices and visits with your health care provider that can promote health and wellness. What does preventive care include?  A yearly physical exam. This is also called an annual well check.  Dental exams once or twice a year.  Routine eye exams. Ask your health care provider how often you should have your eyes checked.  Personal lifestyle choices, including:  Daily care of your teeth and gums.  Regular physical activity.  Eating a healthy diet.  Avoiding tobacco and drug use.  Limiting alcohol use.  Practicing safe sex.  Taking low-dose aspirin every day.  Taking vitamin and mineral supplements as recommended by your health care provider. What happens during an annual well check? The services and screenings done by your health care provider  during your annual well check will depend on your age, overall health, lifestyle risk factors, and family history of disease. Counseling  Your health care provider may ask you questions about your:  Alcohol use.  Tobacco use.  Drug use.  Emotional well-being.  Home and relationship well-being.  Sexual activity.  Eating habits.  History of falls.  Memory and ability to understand (cognition).  Work and work Statistician.  Reproductive health. Screening  You may have the following tests or measurements:  Height, weight, and BMI.  Blood pressure.  Lipid and cholesterol levels. These may be checked every 5 years, or more frequently if you are over 18 years old.  Skin check.  Lung cancer screening. You may have this screening every year starting at age 27 if you have a 30-pack-year history of smoking and currently smoke or have quit within the past 15 years.  Fecal occult blood test (FOBT) of the stool. You may have this test every year starting at age 85.  Flexible sigmoidoscopy or colonoscopy. You may have a sigmoidoscopy every 5 years or a colonoscopy every 10 years starting at age 13.  Hepatitis C blood test.  Hepatitis B blood test.  Sexually transmitted disease (STD) testing.  Diabetes screening. This is done by checking your blood sugar (glucose) after you have not eaten for a while (fasting). You may have this done every 1-3 years.  Bone density scan. This is done to screen for osteoporosis. You may have this done starting at age 20.  Mammogram. This may be done every 1-2 years. Talk to your health care provider about how often you should have regular mammograms. Talk with your health care provider about your test results, treatment options,  and if necessary, the need for more tests. Vaccines  Your health care provider may recommend certain vaccines, such as:  Influenza vaccine. This is recommended every year.  Tetanus, diphtheria, and acellular pertussis  (Tdap, Td) vaccine. You may need a Td booster every 10 years.  Zoster vaccine. You may need this after age 51.  Pneumococcal 13-valent conjugate (PCV13) vaccine. One dose is recommended after age 3.  Pneumococcal polysaccharide (PPSV23) vaccine. One dose is recommended after age 39. Talk to your health care provider about which screenings and vaccines you need and how often you need them. This information is not intended to replace advice given to you by your health care provider. Make sure you discuss any questions you have with your health care provider. Document Released: 06/28/2015 Document Revised: 02/19/2016 Document Reviewed: 04/02/2015 Elsevier Interactive Patient Education  2017 [place] Prevention in the Home Falls can cause injuries. They can happen to people of all ages. There are many things you can do to make your home safe and to help prevent falls. What can I do on the outside of my home?  Regularly fix the edges of walkways and driveways and fix any cracks.  Remove anything that might make you trip as you walk through a door, such as a raised step or threshold.  Trim any bushes or trees on the path to your home.  Use bright outdoor lighting.  Clear any walking paths of anything that might make someone trip, such as rocks or tools.  Regularly check to see if handrails are loose or broken. Make sure that both sides of any steps have handrails.  Any raised decks and porches should have guardrails on the edges.  Have any leaves, snow, or ice cleared regularly.  Use sand or salt on walking paths during winter.  Clean up any spills in your garage right away. This includes oil or grease spills. What can I do in the bathroom?  Use night lights.  Install grab bars by the toilet and in the tub and shower. Do not use towel bars as grab bars.  Use non-skid mats or decals in the tub or shower.  If you need to sit down in the shower, use a plastic, non-slip  stool.  Keep the floor dry. Clean up any water that spills on the floor as soon as it happens.  Remove soap buildup in the tub or shower regularly.  Attach bath mats securely with double-sided non-slip rug tape.  Do not have throw rugs and other things on the floor that can make you trip. What can I do in the bedroom?  Use night lights.  Make sure that you have a light by your bed that is easy to reach.  Do not use any sheets or blankets that are too big for your bed. They should not hang down onto the floor.  Have a firm chair that has side arms. You can use this for support while you get dressed.  Do not have throw rugs and other things on the floor that can make you trip. What can I do in the kitchen?  Clean up any spills right away.  Avoid walking on wet floors.  Keep items that you use a lot in easy-to-reach places.  If you need to reach something above you, use a strong step stool that has a grab bar.  Keep electrical cords out of the way.  Do not use floor polish or wax that makes floors slippery. If  you must use wax, use non-skid floor wax.  Do not have throw rugs and other things on the floor that can make you trip. What can I do with my stairs?  Do not leave any items on the stairs.  Make sure that there are handrails on both sides of the stairs and use them. Fix handrails that are broken or loose. Make sure that handrails are as long as the stairways.  Check any carpeting to make sure that it is firmly attached to the stairs. Fix any carpet that is loose or worn.  Avoid having throw rugs at the top or bottom of the stairs. If you do have throw rugs, attach them to the floor with carpet tape.  Make sure that you have a light switch at the top of the stairs and the bottom of the stairs. If you do not have them, ask someone to add them for you. What else can I do to help prevent falls?  Wear shoes that:  Do not have high heels.  Have rubber bottoms.  Are  comfortable and fit you well.  Are closed at the toe. Do not wear sandals.  If you use a stepladder:  Make sure that it is fully opened. Do not climb a closed stepladder.  Make sure that both sides of the stepladder are locked into place.  Ask someone to hold it for you, if possible.  Clearly mark and make sure that you can see:  Any grab bars or handrails.  First and last steps.  Where the edge of each step is.  Use tools that help you move around (mobility aids) if they are needed. These include:  Canes.  Walkers.  Scooters.  Crutches.  Turn on the lights when you go into a dark area. Replace any light bulbs as soon as they burn out.  Set up your furniture so you have a clear path. Avoid moving your furniture around.  If any of your floors are uneven, fix them.  If there are any pets around you, be aware of where they are.  Review your medicines with your doctor. Some medicines can make you feel dizzy. This can increase your chance of falling. Ask your doctor what other things that you can do to help prevent falls. This information is not intended to replace advice given to you by your health care provider. Make sure you discuss any questions you have with your health care provider. Document Released: 03/28/2009 Document Revised: 11/07/2015 Document Reviewed: 07/06/2014 Elsevier Interactive Patient Education  2017 [employer].

## 2020-10-27 NOTE — Progress Notes (Signed)
Date:  10/28/2020   Name:  Rebecca Gutierrez   DOB:  [birth year]   MRN:  [id number]   Chief Complaint: Annual Exam (Breast Exam. No pap- discontinued.)  Rebecca Gutierrez is a 80 y.o. female who presents today for her Complete Annual Exam. She feels well. She reports exercising - none. She reports she is sleeping well. Breast complaints - none. She is recovering well from gall bladder surgery 2 weeks ago.  Mammogram: scheduled 12/2020 DEXA: scheduled 12/2020 Pap smear: discontinued Colonoscopy: aged out  Immunization History  Administered Date(s) Administered  . Fluad Quad(high Dose 65+) 04/18/2019, 04/29/2020  . Influenza Split 04/01/2015  . Influenza Whole 03/25/2006, 05/25/2011  . Influenza, High Dose Seasonal PF 03/31/2017, 04/22/2018  . Influenza,inj,Quad PF,6+ Mos 03/01/2013, 03/14/2014, 03/24/2016  . PFIZER(Purple Top)SARS-COV-2 Vaccination 06/29/2019, 07/20/2019, 04/03/2020  . Pneumococcal Conjugate-13 09/17/2014  . Pneumococcal Polysaccharide-23 09/23/2009  . Td 02/04/2004, 03/14/2014  . Zoster 07/17/2010  . Zoster Recombinat (Shingrix) 01/09/2019, 05/18/2019    Hypertension This is a chronic problem. The problem is controlled. Pertinent negatives include no chest pain, headaches, palpitations or shortness of breath. Past treatments include beta blockers, ACE inhibitors and diuretics. The current treatment provides significant improvement.  Hyperlipidemia This is a chronic problem. The problem is controlled. Pertinent negatives include no chest pain or shortness of breath. Current antihyperlipidemic treatment includes statins.  S/p abdominal surgery -she is doing well with minimal discomfort.  Bowels are moving normally without constipation or diarrhea.  Appetite is good.  No fevers or chills.  She had some urinary retention after surgery but seems to have resolve.  Lab Results  Component Value Date   CREATININE 0.74 07/22/2020   BUN 17 07/22/2020   NA 143 07/22/2020    K 3.5 07/22/2020   CL 100 07/22/2020   CO2 27 07/22/2020   Lab Results  Component Value Date   CHOL 180 10/25/2019   HDL 64 10/25/2019   LDLCALC 95 10/25/2019   TRIG 121 10/25/2019   CHOLHDL 2.8 10/25/2019   Lab Results  Component Value Date   TSH 3.480 10/25/2019   Lab Results  Component Value Date   HGBA1C 5.6 07/23/2011   Lab Results  Component Value Date   WBC 7.8 07/22/2020   HGB 13.2 07/22/2020   HCT 37.6 07/22/2020   MCV 88 07/22/2020   PLT 224 07/22/2020   Lab Results  Component Value Date   ALT 21 07/22/2020   AST 23 07/22/2020   ALKPHOS 65 07/22/2020   BILITOT 0.9 07/22/2020     Review of Systems  Constitutional: Negative for appetite change, chills, fatigue and fever.  HENT: Negative for congestion, hearing loss, tinnitus, trouble swallowing and voice change.   Eyes: Negative for visual disturbance.  Respiratory: Negative for cough, chest tightness, shortness of breath and wheezing.   Cardiovascular: Negative for chest pain, palpitations and leg swelling.  Gastrointestinal: Positive for abdominal pain (s/p surgery 2 weeks ago - only mild discomfort). Negative for constipation, diarrhea and vomiting.  Endocrine: Negative for polydipsia and polyuria.  Genitourinary: Negative for dysuria, frequency, genital sores, vaginal bleeding and vaginal discharge.  Musculoskeletal: Negative for arthralgias, gait problem and joint swelling.  Skin: Negative for color change and rash.  Neurological: Negative for dizziness, tremors, light-headedness and headaches.  Hematological: Negative for adenopathy. Does not bruise/bleed easily.  Psychiatric/Behavioral: Negative for dysphoric mood and sleep disturbance. The patient is not nervous/anxious.     Patient Active Problem List   Diagnosis Date Noted  .  Aortic atherosclerosis ([place]) 02/02/2020  . Non-melanoma skin cancer 10/25/2019  . Cystocele, midline 04/11/2018  . Moderate mitral insufficiency 11/10/2017  .  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 Recurrent UTI 03/10/2015  . OSTEOPENIA 06/28/2009  . Hyperlipidemia LDL goal <100 06/02/2007  . 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 Hearing loss 06/02/2007  . Essential hypertension, benign 06/02/2007  . Acute bilateral low back pain with bilateral sciatica 06/02/2007    Allergies  Allergen Reactions  . Ciprofloxacin     Tendinitis   . Levaquin [Levofloxacin] Other (See Comments)    insomnia  . [name] Diarrhea  . [name] Palpitations and Rash    Last reaction was in 1969    Past Surgical History:  Procedure Laterality Date  . ABDOMINAL HYSTERECTOMY  2014   partial  . ANTERIOR AND POSTERIOR REPAIR N/A 05/02/2013   Procedure: ANTERIOR (CYSTOCELE) AND POSTERIOR REPAIR (RECTOCELE);  Surgeon: [name], MD;  Location: [facility];  Service: Gynecology;  Laterality: N/A;  . CATARACT EXTRACTION, BILATERAL    . CHOLECYSTECTOMY  10/15/2020  . COLONOSCOPY  08/28/2011   Procedure: COLONOSCOPY;  Surgeon: [name], MD;  Location: [facility];  Service: Endoscopy;  Laterality: N/A;  930  . EYE SURGERY Bilateral 02/13/2011   other 2013  . 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 TUBAL LIGATION    . VAGINAL HYSTERECTOMY N/A 05/02/2013   Procedure: HYSTERECTOMY VAGINAL;  Surgeon: [name], MD;  Location: [facility];  Service: Gynecology;  Laterality: N/A;    Social History   Tobacco Use  . Smoking status: Never Smoker  . Smokeless tobacco: Never Used  . Tobacco comment: smoking cessation materials not required  Vaping Use  . Vaping Use: Never used  Substance Use Topics  . Alcohol use: Yes    Comment: one drink per week  . Drug use: No     Medication list has been reviewed and updated.  Current Meds  Medication Sig  . acetaminophen (TYLENOL) 325 MG tablet Take by mouth.  Marland Kitchen aspirin EC 81 MG tablet Take 81 mg by mouth at bedtime.   . Cholecalciferol 50 MCG (2000 UT) TABS Take  1 capsule by mouth in the morning.  Marland Kitchen estradiol (ESTRACE VAGINAL) 0.1 MG/GM vaginal cream Apply 0.5mg  (pea-sized amount)  just inside the vaginal introitus with a finger-tip on Monday, Wednesday and Friday nights.  . hydrochlorothiazide (HYDRODIURIL) 25 MG tablet TAKE 1 TABLET DAILY  . metoprolol tartrate (LOPRESSOR) 25 MG tablet TAKE ONE AND ONE-HALF TABLETS TWICE A DAY  . nitrofurantoin (MACRODANTIN) 100 MG capsule Take 100 mg by mouth at bedtime.  . ramipril (ALTACE) 10 MG capsule TAKE 1 CAPSULE DAILY  . simvastatin (ZOCOR) 10 MG tablet TAKE 1 TABLET DAILY  . tamsulosin (FLOMAX) 0.4 MG CAPS capsule Take 0.4 mg by mouth daily. For 14 days  . [DISCONTINUED] nitrofurantoin (MACRODANTIN) 100 MG capsule Take 1 capsule (100 mg total) by mouth daily.    PHQ 2/9 Scores 10/28/2020 10/21/2020 04/29/2020 02/02/2020  PHQ - 2 Score 0 0 0 0  PHQ- 9 Score 0 - 0 0    GAD 7 : Generalized Anxiety Score 10/28/2020 04/29/2020 02/02/2020 10/25/2019  Nervous, Anxious, on Edge 0 0 0 0  Control/stop worrying 0 0 0 0  Worry too much - different things 0 0 0 0  Trouble relaxing 0 0 0 0  Restless 0 0 0 0  Easily annoyed or irritable 0 0 0  0  Afraid - awful might happen 0 0 0 0  Total GAD 7 Score 0 0 0 0  Anxiety Difficulty Not difficult at all Not difficult at all Not difficult at all Not difficult at all    BP Readings from Last 3 Encounters:  10/28/20 140/72  08/19/20 138/72  07/22/20 (!) 157/73    Physical Exam Vitals and nursing note reviewed.  Constitutional:      General: She is not in acute distress.    Appearance: She is well-developed.  HENT:     Head: Normocephalic and atraumatic.     Right Ear: Tympanic membrane and ear canal normal.     Left Ear: Tympanic membrane and ear canal normal.     Nose:     Right Sinus: No maxillary sinus tenderness.     Left Sinus: No maxillary sinus tenderness.  Eyes:     General: No scleral icterus.       Right eye: No discharge.        Left eye: No  discharge.     Conjunctiva/sclera: Conjunctivae normal.  Neck:     Thyroid: No thyromegaly.     Vascular: No carotid bruit.  Cardiovascular:     Rate and Rhythm: Normal rate and regular rhythm.     Pulses: Normal pulses.     Heart sounds: Normal heart sounds.  Pulmonary:     Effort: Pulmonary effort is normal. No respiratory distress.     Breath sounds: No wheezing.  Chest:  Breasts:     Right: No mass, nipple discharge, skin change or tenderness.     Left: No mass, nipple discharge, skin change or tenderness.    Abdominal:     General: Bowel sounds are normal.     Palpations: Abdomen is soft.     Tenderness: There is no abdominal tenderness.    Musculoskeletal:     Cervical back: Normal range of motion. No erythema.     Right lower leg: No edema.     Left lower leg: No edema.  Lymphadenopathy:     Cervical: No cervical adenopathy.  Skin:    General: Skin is warm and dry.     Findings: No rash.  Neurological:     Mental Status: She is alert and oriented to person, place, and time.     Cranial Nerves: No cranial nerve deficit.     Sensory: No sensory deficit.     Deep Tendon Reflexes: Reflexes are normal and symmetric.  Psychiatric:        Attention and Perception: Attention normal.        Mood and Affect: Mood normal.        Behavior: Behavior normal.     Wt Readings from Last 3 Encounters:  10/28/20 126 lb 3.2 oz (57.2 kg)  08/19/20 133 lb (60.3 kg)  07/22/20 135 lb 12.8 oz (61.6 kg)    BP 140/72   Pulse 72   Ht 5\' 1"  (1.549 m)   Wt 126 lb 3.2 oz (57.2 kg)   BMI 23.85 kg/m   Assessment and Plan: 1. Annual physical exam Normal exam Continue healthy diet; resume exercise when able  2. Need for hepatitis C screening test - Hepatitis C antibody  3. Essential hypertension, benign Clinically stable exam with well controlled BP. Tolerating medications without side effects at this time. Pt to continue current regimen and low sodium diet; benefits of  regular exercise as able discussed. - Comprehensive metabolic panel - TSH  4.  Aortic atherosclerosis ([place]) On statin therapy  5. Hyperlipidemia LDL goal <100 Tolerating statin medication without side effects at this time LDL is almost at goal of < 70 on current dose Continue same therapy without change at this time. - Lipid panel  6. S/P cholecystectomy Doing well; staples intact with normal wound healing Follow up with Surgery for post op check in 10 days as planned   Partially dictated using Dragon software. Any errors are unintentional.  Halina Maidens, MD [facility]  10/28/2020

## 2020-10-28 ENCOUNTER — Ambulatory Visit (INDEPENDENT_AMBULATORY_CARE_PROVIDER_SITE_OTHER): Payer: Medicare HMO | Admitting: Internal Medicine

## 2020-10-28 ENCOUNTER — Encounter: Payer: Self-pay | Admitting: Internal Medicine

## 2020-10-28 ENCOUNTER — Other Ambulatory Visit: Payer: Self-pay

## 2020-10-28 VITALS — BP 140/72 | HR 72 | Ht 61.0 in | Wt 126.2 lb

## 2020-10-28 DIAGNOSIS — I700 Atherosclerosis of aorta: Secondary | ICD-10-CM

## 2020-10-28 DIAGNOSIS — E785 Hyperlipidemia, unspecified: Secondary | ICD-10-CM

## 2020-10-28 DIAGNOSIS — Z0000 Encounter for general adult medical examination without abnormal findings: Secondary | ICD-10-CM

## 2020-10-28 DIAGNOSIS — I10 Essential (primary) hypertension: Secondary | ICD-10-CM | POA: Diagnosis not present

## 2020-10-28 DIAGNOSIS — Z9049 Acquired absence of other specified parts of digestive tract: Secondary | ICD-10-CM

## 2020-10-28 DIAGNOSIS — Z1159 Encounter for screening for other viral diseases: Secondary | ICD-10-CM

## 2020-10-29 LAB — LIPID PANEL
Chol/HDL Ratio: 3.4 ratio (ref 0.0–4.4)
Cholesterol, Total: 178 mg/dL (ref 100–199)
HDL: 53 mg/dL (ref >39)
LDL Chol Calc (NIH): 99 mg/dL (ref 0–99)
Triglycerides: 152 mg/dL — ABNORMAL HIGH (ref 0–149)
VLDL Cholesterol Cal: 26 mg/dL (ref 5–40)

## 2020-10-29 LAB — COMPREHENSIVE METABOLIC PANEL
ALT: 26 IU/L (ref 0–32)
AST: 21 IU/L (ref 0–40)
Albumin/Globulin Ratio: 1.9 (ref 1.2–2.2)
Albumin: 4.4 g/dL (ref 3.7–4.7)
Alkaline Phosphatase: 70 IU/L (ref 44–121)
BUN/Creatinine Ratio: 23 (ref 12–28)
BUN: 18 mg/dL (ref 8–27)
Bilirubin Total: 1.1 mg/dL (ref 0.0–1.2)
CO2: 22 mmol/L (ref 20–29)
Calcium: 9.6 mg/dL (ref 8.7–10.3)
Chloride: 96 mmol/L (ref 96–106)
Creatinine, Ser: 0.77 mg/dL (ref 0.57–1.00)
Globulin, Total: 2.3 g/dL (ref 1.5–4.5)
Glucose: 120 mg/dL — ABNORMAL HIGH (ref 65–99)
Potassium: 3.9 mmol/L (ref 3.5–5.2)
Sodium: 136 mmol/L (ref 134–144)
Total Protein: 6.7 g/dL (ref 6.0–8.5)
eGFR: 79 mL/min/{1.73_m2} (ref >59)

## 2020-10-29 LAB — TSH: TSH: 2.3 u[IU]/mL (ref 0.450–4.500)

## 2020-10-29 LAB — HEPATITIS C ANTIBODY: Hep C Virus Ab: <0.1 s/co ratio (ref 0.0–0.9)

## 2020-11-20 ENCOUNTER — Encounter: Payer: Self-pay | Admitting: Gastroenterology

## 2020-11-20 ENCOUNTER — Telehealth: Payer: Self-pay | Admitting: Gastroenterology

## 2020-11-20 NOTE — Telephone Encounter (Signed)
Left patient a VM informing her that Dr. Bonna Gains will not be in the office on 12/23/20 and had to change her appt to 01/07/21 at 3:15p..Call the office to confirm. A letter will be sent to address on file.

## 2020-12-09 ENCOUNTER — Other Ambulatory Visit: Payer: Self-pay | Admitting: Internal Medicine

## 2020-12-09 DIAGNOSIS — E785 Hyperlipidemia, unspecified: Secondary | ICD-10-CM

## 2020-12-19 ENCOUNTER — Ambulatory Visit
Admission: RE | Admit: 2020-12-19 | Discharge: 2020-12-19 | Disposition: A | Discharge status: Home / Self Care | Payer: Medicare HMO | Source: Ambulatory Visit | Attending: Internal Medicine | Admitting: Internal Medicine

## 2020-12-19 ENCOUNTER — Other Ambulatory Visit: Payer: Self-pay

## 2020-12-19 DIAGNOSIS — Z1231 Encounter for screening mammogram for malignant neoplasm of breast: Secondary | ICD-10-CM | POA: Insufficient documentation

## 2020-12-19 DIAGNOSIS — Z780 Asymptomatic menopausal state: Secondary | ICD-10-CM | POA: Insufficient documentation

## 2020-12-23 ENCOUNTER — Ambulatory Visit: Payer: Medicare HMO | Admitting: Gastroenterology

## 2020-12-26 ENCOUNTER — Other Ambulatory Visit: Payer: Self-pay | Admitting: Internal Medicine

## 2021-01-07 ENCOUNTER — Other Ambulatory Visit: Payer: Self-pay | Admitting: Internal Medicine

## 2021-01-07 ENCOUNTER — Other Ambulatory Visit: Payer: Self-pay

## 2021-01-07 ENCOUNTER — Ambulatory Visit (INDEPENDENT_AMBULATORY_CARE_PROVIDER_SITE_OTHER): Payer: Medicare HMO | Admitting: Gastroenterology

## 2021-01-07 VITALS — BP 152/71 | HR 78 | Temp 97.5°F | Wt 130.0 lb

## 2021-01-07 DIAGNOSIS — K219 Gastro-esophageal reflux disease without esophagitis: Secondary | ICD-10-CM

## 2021-01-07 DIAGNOSIS — R109 Unspecified abdominal pain: Secondary | ICD-10-CM | POA: Diagnosis not present

## 2021-01-07 DIAGNOSIS — R002 Palpitations: Secondary | ICD-10-CM

## 2021-01-07 DIAGNOSIS — I10 Essential (primary) hypertension: Secondary | ICD-10-CM

## 2021-01-07 NOTE — Progress Notes (Signed)
Vonda Antigua, MD [address]  [place]  [place], [place] [address]  Main: [phone]  Fax: [phone]   Primary Care Physician: [name], MD   Chief complaint: Abdominal pain  HPI: Rebecca Gutierrez is a 80 y.o. female here for follow-up of abdominal pain.  Patient was recently admitted to [facility] for cholecystitis and underwent open cholecystectomy.  Patient states her abdominal pain has not reoccurred since hospital discharge.  Denies any nausea or vomiting.  Reports very occasional heartburn, lasting 1 to 2 seconds, about twice a week.  Not on any medications for it at this time.  Denies any dysphagia.  Reports 1-2 bowel movements a day, which is mostly formed with very occasional loose stools.  No blood in stool.    ROS: All ROS reviewed and negative except as per HPI   Past Medical History:  Diagnosis Date   Abnormal ECG 10/28/2017   Anxiety    Bradycardia 11/10/2017   Cancer ([place])    skin ca   Cystocele 09/13/2013   small recurrent, less than before 09/13/13 JVF    Gilbert's syndrome    Hearing loss 1975   History of recurrent urinary tract infection 03/24/2016   Hyperlipidemia 2000   Hypertension 2000    Past Surgical History:  Procedure Laterality Date   ABDOMINAL HYSTERECTOMY  2014   partial   ANTERIOR AND POSTERIOR REPAIR N/A 05/02/2013   Procedure: ANTERIOR (CYSTOCELE) AND POSTERIOR REPAIR (RECTOCELE);  Surgeon: [name], MD;  Location: [facility];  Service: Gynecology;  Laterality: N/A;   CATARACT EXTRACTION, BILATERAL     CHOLECYSTECTOMY  10/15/2020   COLONOSCOPY  08/28/2011   Procedure: COLONOSCOPY;  Surgeon: [name], MD;  [facility];  Service: Endoscopy;  Laterality: N/A;  930   EYE SURGERY Bilateral 02/13/2011   other 2013   MOHS SURGERY  06/2016   Removed skin cancer from Rt shin   TUBAL LIGATION     VAGINAL HYSTERECTOMY N/A 05/02/2013   Procedure: HYSTERECTOMY VAGINAL;  Surgeon: [name], MD;   Location: [facility];  Service: Gynecology;  Laterality: N/A;    Prior to Admission medications   Medication Sig Start Date End Date Taking? Authorizing Provider  acetaminophen (TYLENOL) 325 MG tablet Take by mouth. 10/18/20  Yes [provider]  aspirin EC 81 MG tablet Take 81 mg by mouth at bedtime.    Yes [provider]  Cholecalciferol 50 MCG (2000 UT) TABS Take 1 capsule by mouth in the morning.   Yes [provider]  estradiol (ESTRACE VAGINAL) 0.1 MG/GM vaginal cream Apply 0.'5mg'$  (pea-sized amount)  just inside the vaginal introitus with a finger-tip on Monday, Wednesday and Friday nights. 09/07/18  Yes McGowan, [name] A, PA-C  hydrochlorothiazide (HYDRODIURIL) 25 MG tablet TAKE 1 TABLET DAILY 12/26/20  Yes [name], MD  metoprolol tartrate (LOPRESSOR) 25 MG tablet TAKE ONE AND ONE-HALF TABLETS TWICE A DAY 01/07/21  Yes [name], MD  nitrofurantoin (MACRODANTIN) 100 MG capsule Take 100 mg by mouth at bedtime.   Yes [provider]  ramipril (ALTACE) 10 MG capsule TAKE 1 CAPSULE DAILY 01/07/21  Yes [name], MD  simvastatin (ZOCOR) 10 MG tablet TAKE 1 TABLET DAILY 12/09/20  Yes [name], MD  tamsulosin (FLOMAX) 0.4 MG CAPS capsule Take 0.4 mg by mouth daily. For 14 days 10/18/20  Yes [provider]    Family History  Problem Relation Age of Onset  Hypertension Mother    Cancer Mother        stomach   Healthy Father    Healthy Brother    Colon cancer Neg Hx    Prostate cancer Neg Hx    Kidney cancer Neg Hx    Bladder Cancer Neg Hx    Breast cancer Neg Hx      Social History   Tobacco Use   Smoking status: Never   Smokeless tobacco: Never   Tobacco comments:    smoking cessation materials not required  Vaping Use   Vaping Use: Never used  Substance Use Topics   Alcohol use: Yes    Comment: one drink per week   Drug use: No    Allergies as of 01/07/2021 - Review Complete 01/07/2021  Allergen  Reaction Noted   Ciprofloxacin  05/27/2018   Levaquin [levofloxacin] Other (See Comments) 05/11/2017   [name] Diarrhea 06/24/2015   [name] Palpitations and Rash 06/02/2007    Physical Examination:  Constitutional: General:   Alert,  Well-developed, well-nourished, pleasant and cooperative in NAD BP (!) 152/71   Pulse 78   Temp (!) 97.5 F (36.4 C) (Oral)   Wt 130 lb (59 kg)   BMI 24.56 kg/m   Respiratory: Normal respiratory effort  Gastrointestinal:  Soft, non-tender and non-distended without masses, hepatosplenomegaly or hernias noted.  No guarding or rebound tenderness.     Cardiac: No clubbing or edema.  No cyanosis. Normal posterior tibial pedal pulses noted.  Psych:  Alert and cooperative. Normal mood and affect.  Musculoskeletal:  Normal gait. Head normocephalic, atraumatic. Symmetrical without gross deformities. 5/5 Lower extremity strength bilaterally.  Skin: Warm. Intact without significant lesions or rashes. No jaundice.  Neck: Supple, trachea midline  Lymph: No cervical lymphadenopathy  Psych:  Alert and oriented x3, Alert and cooperative. Normal mood and affect.  Labs: CMP     Component Value Date/Time   NA 136 10/28/2020 0912   NA 142 10/13/2011 1515   K 3.9 10/28/2020 0912   K 3.8 10/13/2011 1515   CL 96 10/28/2020 0912   CL 103 10/13/2011 1515   CO2 22 10/28/2020 0912   CO2 33 (H) 10/13/2011 1515   GLUCOSE 120 (H) 10/28/2020 0912   GLUCOSE 91 09/25/2015 0822   GLUCOSE 113 (H) 10/13/2011 1515   BUN 18 10/28/2020 0912   BUN 16 10/13/2011 1515   CREATININE 0.77 10/28/2020 0912   CREATININE 0.73 09/25/2015 0822   CALCIUM 9.6 10/28/2020 0912   CALCIUM 9.4 10/13/2011 1515   PROT 6.7 10/28/2020 0912   PROT 7.5 10/13/2011 1515   ALBUMIN 4.4 10/28/2020 0912   ALBUMIN 3.9 10/13/2011 1515   AST 21 10/28/2020 0912   AST 21 10/13/2011 1515   ALT 26 10/28/2020 0912   ALT 28 10/13/2011 1515   ALKPHOS 70 10/28/2020  0912   ALKPHOS 74 10/13/2011 1515   BILITOT 1.1 10/28/2020 0912   BILITOT 1.2 (H) 10/13/2011 1515   GFRNONAA 78 07/22/2020 1153   GFRNONAA 85 03/29/2015 0912   GFRAA 90 07/22/2020 1153   GFRAA >89 03/29/2015 0912   Lab Results  Component Value Date   WBC 7.8 07/22/2020   HGB 13.2 07/22/2020   HCT 37.6 07/22/2020   MCV 88 07/22/2020   PLT 224 07/22/2020    Imaging Studies:   Assessment and Plan:   Rebecca Gutierrez is a 80 y.o. y/o female here for follow-up of abdominal pain and GERD  Abdominal pain has completely resolved Doing well  post cholecystectomy. Hospital records from [facility] from her surgery reviewed No postcholecystectomy diarrhea at this point  If patient develops to stools, she was advised to call us and we can start her on colestipol or cholestyramine and she verbalized understanding  Reflux symptoms are well controlled at this point with very occasional intermittent symptoms, not requiring daily medications  Patient educated extensively on acid reflux lifestyle modification, including buying a bed wedge, not eating 3 hrs before bedtime, diet modifications, and handout given for the same.      Dr Vonda Antigua

## 2021-01-07 NOTE — Telephone Encounter (Signed)
Requested Prescriptions  Pending Prescriptions Disp Refills  . ramipril (ALTACE) 10 MG capsule [Pharmacy Med Name: RAMIPRIL CAPS '10MG'$ ] 90 capsule 1    Sig: TAKE 1 CAPSULE DAILY     Cardiovascular:  ACE Inhibitors Failed - 01/07/2021 12:54 AM      Failed - Last BP in normal range    BP Readings from Last 1 Encounters:  10/28/20 140/72         Passed - Cr in normal range and within 180 days    Creat  Date Value Ref Range Status  09/25/2015 0.73 0.60 - 0.93 mg/dL Final   Creatinine, Ser  Date Value Ref Range Status  10/28/2020 0.77 0.57 - 1.00 mg/dL Final         Passed - K in normal range and within 180 days    Potassium  Date Value Ref Range Status  10/28/2020 3.9 3.5 - 5.2 mmol/L Final  10/13/2011 3.8 3.5 - 5.1 mmol/L Final         Passed - Patient is not pregnant      Passed - Valid encounter within last 6 months    Recent Outpatient Visits          2 months ago Annual physical exam   [facility] [name], MD   8 months ago Essential hypertension, benign   [facility] [name], MD   11 months ago Epigastric pain   [facility] [name], MD   1 year ago Annual physical exam   [facility] [name], MD   1 year ago Pain of left upper extremity   [facility] [name], MD      Future Appointments            Today Virgel Manifold, MD [place]   In 3 months [name], MD [facility], [place]   In 4 months Bjorn Loser, MD [place]   In 9 months [name], Jesse Sans, MD [facility]           . metoprolol tartrate (LOPRESSOR) 25 MG tablet [Pharmacy Med Name: METOPROLOL TARTRATE TABS '25MG'$ ] 270 tablet 1    Sig: TAKE ONE AND ONE-HALF TABLETS TWICE A DAY     Cardiovascular:  Beta Blockers Failed - 01/07/2021 12:54 AM      Failed - Last BP in normal range    BP Readings from Last 1 Encounters:  10/28/20  140/72         Passed - Last Heart Rate in normal range    Pulse Readings from Last 1 Encounters:  10/28/20 72         Passed - Valid encounter within last 6 months    Recent Outpatient Visits          2 months ago Annual physical exam   [facility] [name], MD   8 months ago Essential hypertension, benign   [facility] [name], MD   11 months ago Epigastric pain   [facility] [name], MD   1 year ago Annual physical exam   [facility] [name], MD   1 year ago Pain of left upper extremity   [facility] [name], MD      Future Appointments            Today Virgel Manifold, MD  GI  [place]   In 3 months [name], Jesse Sans, MD [facility], [place]   In 4 months [place], Nicki Reaper, MD [place]   In 9 months [name] Jesse Sans, MD [facility], [facility]

## 2021-03-26 ENCOUNTER — Other Ambulatory Visit: Payer: Self-pay | Admitting: Internal Medicine

## 2021-03-26 NOTE — Telephone Encounter (Signed)
Requested Prescriptions  Pending Prescriptions Disp Refills  . hydrochlorothiazide (HYDRODIURIL) 25 MG tablet [Pharmacy Med Name: HYDROCHLOROTHIAZIDE TABS 25MG ] 90 tablet 0    Sig: TAKE 1 TABLET DAILY     Cardiovascular: Diuretics - Thiazide Failed - 03/26/2021 12:55 AM      Failed - Last BP in normal range    BP Readings from Last 1 Encounters:  01/07/21 (!) 152/71         Passed - Ca in normal range and within 360 days    Calcium  Date Value Ref Range Status  10/28/2020 9.6 8.7 - 10.3 mg/dL Final   Calcium, Total  Date Value Ref Range Status  10/13/2011 9.4 8.5 - 10.1 mg/dL Final         Passed - Cr in normal range and within 360 days    Creat  Date Value Ref Range Status  09/25/2015 0.73 0.60 - 0.93 mg/dL Final   Creatinine, Ser  Date Value Ref Range Status  10/28/2020 0.77 0.57 - 1.00 mg/dL Final         Passed - K in normal range and within 360 days    Potassium  Date Value Ref Range Status  10/28/2020 3.9 3.5 - 5.2 mmol/L Final  10/13/2011 3.8 3.5 - 5.1 mmol/L Final         Passed - Na in normal range and within 360 days    Sodium  Date Value Ref Range Status  10/28/2020 136 134 - 144 mmol/L Final  10/13/2011 142 136 - 145 mmol/L Final         Passed - Valid encounter within last 6 months    Recent Outpatient Visits          4 months ago Annual physical exam   [facility] [name], MD   11 months ago Essential hypertension, benign   [facility] [name], MD   1 year ago Epigastric pain   [facility] [name], MD   1 year ago Annual physical exam   [facility] [name], MD   1 year ago Pain of left upper extremity   [facility] [name], MD      Future Appointments            In 1 month [name], Jesse Sans, MD [facility], [place]   In 1 month [place], Nicki Reaper, [place]   In 2 months Virgel Manifold, MD [place]   In 7 months [name] Jesse Sans, MD [facility], [facility]

## 2021-04-30 ENCOUNTER — Other Ambulatory Visit: Payer: Self-pay

## 2021-04-30 ENCOUNTER — Encounter: Payer: Self-pay | Admitting: Internal Medicine

## 2021-04-30 ENCOUNTER — Ambulatory Visit (INDEPENDENT_AMBULATORY_CARE_PROVIDER_SITE_OTHER): Payer: Medicare HMO | Admitting: Internal Medicine

## 2021-04-30 VITALS — BP 128/74 | HR 55 | Ht 61.0 in | Wt 131.0 lb

## 2021-04-30 DIAGNOSIS — I10 Essential (primary) hypertension: Secondary | ICD-10-CM | POA: Diagnosis not present

## 2021-04-30 DIAGNOSIS — N390 Urinary tract infection, site not specified: Secondary | ICD-10-CM

## 2021-04-30 DIAGNOSIS — R739 Hyperglycemia, unspecified: Secondary | ICD-10-CM | POA: Diagnosis not present

## 2021-04-30 MED ORDER — ESTRADIOL 0.1 MG/GM VA CREA: TOPICAL_CREAM | VAGINAL | 0 refills | Status: AC

## 2021-04-30 NOTE — Progress Notes (Signed)
Date:  04/30/2021   Name:  Rebecca Gutierrez   DOB:  1940/11/01   MRN:  [id number]   Chief Complaint: Hypertension  Hypertension This is a chronic problem. The problem is controlled. Pertinent negatives include no chest pain, headaches, palpitations or shortness of breath. There are no associated agents to hypertension. Past treatments include ACE inhibitors, beta blockers and diuretics. The current treatment provides significant improvement. There are no compliance problems.  There is no history of kidney disease, CAD/MI or CVA.   Lab Results  Component Value Date   CREATININE 0.77 10/28/2020   BUN 18 10/28/2020   NA 136 10/28/2020   K 3.9 10/28/2020   CL 96 10/28/2020   CO2 22 10/28/2020   Lab Results  Component Value Date   CHOL 178 10/28/2020   HDL 53 10/28/2020   LDLCALC 99 10/28/2020   TRIG 152 (H) 10/28/2020   CHOLHDL 3.4 10/28/2020   Lab Results  Component Value Date   TSH 2.300 10/28/2020   Lab Results  Component Value Date   HGBA1C 5.6 07/23/2011   Lab Results  Component Value Date   WBC 7.8 07/22/2020   HGB 13.2 07/22/2020   HCT 37.6 07/22/2020   MCV 88 07/22/2020   PLT 224 07/22/2020   Lab Results  Component Value Date   ALT 26 10/28/2020   AST 21 10/28/2020   ALKPHOS 70 10/28/2020   BILITOT 1.1 10/28/2020     Review of Systems  Constitutional:  Negative for chills, fatigue and unexpected weight change.  HENT:  Positive for postnasal drip (with some morning phlegm). Negative for nosebleeds, sore throat and trouble swallowing.   Eyes:  Negative for visual disturbance.  Respiratory:  Negative for cough, chest tightness, shortness of breath and wheezing.   Cardiovascular:  Negative for chest pain, palpitations and leg swelling.  Gastrointestinal:  Negative for abdominal pain, constipation and diarrhea.  Musculoskeletal:  Negative for arthralgias and gait problem.  Neurological:  Negative for dizziness, weakness, light-headedness and headaches.   Psychiatric/Behavioral:  Negative for dysphoric mood and sleep disturbance. The patient is not nervous/anxious.    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   Hearing loss 06/02/2007   Essential hypertension, benign 06/02/2007   Acute bilateral low back pain with bilateral sciatica 06/02/2007    Allergies  Allergen Reactions   Ciprofloxacin Other (See Comments)    Tendinitis    Levaquin [Levofloxacin] Other (See Comments)    insomnia   [name] Diarrhea   [name] Palpitations and Rash    Last reaction was in 1969    Past Surgical History:  Procedure Laterality Date   ABDOMINAL HYSTERECTOMY  2014   partial   ANTERIOR AND POSTERIOR REPAIR N/A 05/02/2013   Procedure: ANTERIOR (CYSTOCELE) AND POSTERIOR REPAIR (RECTOCELE);  Surgeon: [name], MD;  Location: [facility];  Service: Gynecology;  Laterality: N/A;   CATARACT EXTRACTION, BILATERAL     CHOLECYSTECTOMY  10/15/2020   COLONOSCOPY  08/28/2011   Procedure: COLONOSCOPY;  Surgeon: [name], MD;  Location: [facility];  Service: Endoscopy;  Laterality: N/A;  930   EYE SURGERY Bilateral 02/13/2011   other 2013   MOHS  SURGERY  06/2016   Removed skin cancer from Rt shin   TUBAL LIGATION     VAGINAL HYSTERECTOMY N/A 05/02/2013   Procedure: HYSTERECTOMY VAGINAL;  Surgeon: [name], MD;  Location: [facility];  Service: Gynecology;  Laterality: N/A;    Social History   Tobacco Use   Smoking status: Never   Smokeless tobacco: Never  Vaping Use   Vaping Use: Never used  Substance Use Topics   Alcohol use: Yes    Alcohol/week: 1.0 standard drink     Types: 1 Standard drinks or equivalent per week   Drug use: Never     Medication list has been reviewed and updated.  Current Meds  Medication Sig   aspirin EC 81 MG tablet Take 81 mg by mouth at bedtime.    Cholecalciferol 50 MCG (2000 UT) TABS Take 1 capsule by mouth in the morning.   estradiol (ESTRACE VAGINAL) 0.1 MG/GM vaginal cream Apply 0.5mg  (pea-sized amount)  just inside the vaginal introitus with a finger-tip on Monday, Wednesday and Friday nights.   hydrochlorothiazide (HYDRODIURIL) 25 MG tablet TAKE 1 TABLET DAILY   metoprolol tartrate (LOPRESSOR) 25 MG tablet TAKE ONE AND ONE-HALF TABLETS TWICE A DAY   nitrofurantoin (MACRODANTIN) 100 MG capsule Take 100 mg by mouth at bedtime.   ramipril (ALTACE) 10 MG capsule TAKE 1 CAPSULE DAILY   simvastatin (ZOCOR) 10 MG tablet TAKE 1 TABLET DAILY    PHQ 2/9 Scores 04/30/2021 10/28/2020 10/21/2020 04/29/2020  PHQ - 2 Score 0 0 0 0  PHQ- 9 Score 0 0 - 0    GAD 7 : Generalized Anxiety Score 04/30/2021 10/28/2020 04/29/2020 02/02/2020  Nervous, Anxious, on Edge 0 0 0 0  Control/stop worrying 0 0 0 0  Worry too much - different things 0 0 0 0  Trouble relaxing 0 0 0 0  Restless 0 0 0 0  Easily annoyed or irritable 0 0 0 0  Afraid - awful might happen 0 0 0 0  Total GAD 7 Score 0 0 0 0  Anxiety Difficulty Not difficult at all Not difficult at all Not difficult at all Not difficult at all    BP Readings from Last 3 Encounters:  04/30/21 128/74  01/07/21 (!) 152/71  10/28/20 140/72    Physical Exam Vitals and nursing note reviewed.  Constitutional:      General: She is not in acute distress.    Appearance: Normal appearance. She is well-developed.  HENT:     Head: Normocephalic and atraumatic.     Nose: Nose normal. No congestion.  Neck:     Vascular: No carotid bruit.  Cardiovascular:     Rate and Rhythm: Normal rate and regular rhythm.     Pulses: Normal pulses.  Pulmonary:     Effort: Pulmonary effort is normal. No  respiratory distress.     Breath sounds: No wheezing or rhonchi.  Musculoskeletal:     Cervical back: Normal range of motion.     Right lower leg: No edema.     Left lower leg: No edema.  Lymphadenopathy:     Cervical: No cervical adenopathy.  Skin:    General: Skin is warm and dry.     Findings: No rash.  Neurological:     Mental Status: She is alert and oriented to person, place, and time.  Psychiatric:        Mood and Affect: Mood normal.        Behavior: Behavior normal.  Wt Readings from Last 3 Encounters:  04/30/21 131 lb (59.4 kg)  01/07/21 130 lb (59 kg)  10/28/20 126 lb 3.2 oz (57.2 kg)    BP 128/74 (BP Location: Right Arm, Patient Position: Sitting, Cuff Size: Normal)   Pulse (!) 55   Ht 5\' 1"  (1.549 m)   Wt 131 lb (59.4 kg)   SpO2 98%   BMI 24.75 kg/m   Assessment and Plan: 1. Essential hypertension, benign Clinically stable exam with well controlled BP. Tolerating medications without side effects at this time. Pt to continue current regimen and low sodium diet; benefits of regular exercise as able discussed. - Basic metabolic panel  2. Hyperglycemia Will rule out early DM - Hemoglobin A1c  3. Recurrent UTI - estradiol (ESTRACE VAGINAL) 0.1 MG/GM vaginal cream; Apply 0.5mg  (pea-sized amount)  just inside the vaginal introitus with a finger-tip on Monday, Wednesday and Friday nights.  Dispense: 30 g; Refill: 0   Partially dictated using Editor, commissioning. Any errors are unintentional.  Halina Maidens, MD [facility]  04/30/2021

## 2021-05-01 LAB — BASIC METABOLIC PANEL
BUN/Creatinine Ratio: 20 (ref 12–28)
BUN: 16 mg/dL (ref 8–27)
CO2: 25 mmol/L (ref 20–29)
Calcium: 10 mg/dL (ref 8.7–10.3)
Chloride: 98 mmol/L (ref 96–106)
Creatinine, Ser: 0.79 mg/dL (ref 0.57–1.00)
Glucose: 97 mg/dL (ref 70–99)
Potassium: 4 mmol/L (ref 3.5–5.2)
Sodium: 138 mmol/L (ref 134–144)
eGFR: 77 mL/min/{1.73_m2} (ref >59)

## 2021-05-01 LAB — HEMOGLOBIN A1C
Est. average glucose Bld gHb Est-mCnc: 117 mg/dL
Hgb A1c MFr Bld: 5.7 % — ABNORMAL HIGH (ref 4.8–5.6)

## 2021-05-05 ENCOUNTER — Ambulatory Visit (INDEPENDENT_AMBULATORY_CARE_PROVIDER_SITE_OTHER): Payer: Medicare HMO | Admitting: Urology

## 2021-05-05 ENCOUNTER — Encounter: Payer: Self-pay | Admitting: Urology

## 2021-05-05 ENCOUNTER — Other Ambulatory Visit: Payer: Self-pay

## 2021-05-05 VITALS — BP 171/78 | HR 67 | Ht 61.0 in | Wt 131.0 lb

## 2021-05-05 DIAGNOSIS — N3020 Other chronic cystitis without hematuria: Secondary | ICD-10-CM

## 2021-05-05 NOTE — Progress Notes (Signed)
05/05/2021 10:02 AM   Cristal Ford Mesch 01-Aug-1940 [id number]  Referring provider: [name], MD [address] [place],  [place] [address]  No chief complaint on file.   HPI: 2019 I saw patient in 2019.   She can feel some vaginal bulging but she was actually quite nonspecific about it and has been chronic.  Used to be managed with pessary    Patient has had 5 or 6 bladder infections with typical cystitis symptoms that generally respond to antibiotics.  She does not really have prolapse symptoms otherwise.  She is had a hysterectomy.  She had an ultrasound that demonstrated a residual of 236 mL. Kidneys are normal   1 pelvic examination patient had moderate grade 3 cystocele with central defect. Vaginal cuff descended from 8 or 9 cm to approximately 5 cm. No rectocele. No stress incontinence. Cystocele was just reached the introitus   Residual today 175 mL   Picture drawn. Patient has a relatively asymptomatic cystocele. It could be related to the elevated residual. She understands if she had a transvaginal vault suspension with cystocele repair and graft her residual may improve with a natural history of her bladder infections is likely not going to change. She was getting breakthrough infections on trimethoprim. I suggest to see me back on daily Macrodantin in 2 months and check another residual. It is a lot of surgery to go through hoping to reduce bladder infection frequency. Role of urodynamics not discussed   Patient infection free on Macrodantin but for some reason she thought she should take it 4 times a day   I spent a lot of time clarifying to take it once a day and prescription was renewed 90x3 and see in 4 months   Today   Is delighted an infection free on daily Macrodantin.  Frequency stable.  No urge incontinence.  Not infected today  Today Frequency stable.  No infections.  She is delighted.       PMH: Past Medical History:  Diagnosis Date    Abnormal ECG 10/28/2017   Anxiety    Bradycardia 11/10/2017   Cancer ([place])    skin ca   Cystocele 09/13/2013   small recurrent, less than before 09/13/13 JVF    Gilbert's syndrome    Hearing loss 1975   History of recurrent urinary tract infection 03/24/2016   Hyperlipidemia 2000   Hypertension 2000    Surgical History: Past Surgical History:  Procedure Laterality Date   ABDOMINAL HYSTERECTOMY  2014   partial   ANTERIOR AND POSTERIOR REPAIR N/A 05/02/2013   Procedure: ANTERIOR (CYSTOCELE) AND POSTERIOR REPAIR (RECTOCELE);  Surgeon: [name], MD;  Location: [facility];  Service: Gynecology;  Laterality: N/A;   CATARACT EXTRACTION, BILATERAL     CHOLECYSTECTOMY  10/15/2020   COLONOSCOPY  08/28/2011   Procedure: COLONOSCOPY;  Surgeon: [name], MD;  [facility];  Service: Endoscopy;  Laterality: N/A;  930   EYE SURGERY Bilateral 02/13/2011   other 2013   MOHS SURGERY  06/2016   Removed skin cancer from Rt shin   TUBAL LIGATION     VAGINAL HYSTERECTOMY N/A 05/02/2013   Procedure: HYSTERECTOMY VAGINAL;  Surgeon: [name], MD;  Location: [facility];  Service: Gynecology;  Laterality: N/A;    Home Medications:  Allergies as of 05/05/2021       Reactions   Ciprofloxacin Other (See Comments)   Tendinitis    Levaquin [levofloxacin] Other (See Comments)  insomnia   [name] Diarrhea   [name] Palpitations, Rash   Last reaction was in 1969        Medication List        Accurate as of May 05, 2021 10:02 AM. If you have any questions, ask your nurse or doctor.          aspirin EC 81 MG tablet Take 81 mg by mouth at bedtime.   Cholecalciferol 50 MCG (2000 UT) Tabs Take 1 capsule by mouth in the morning.   estradiol 0.1 MG/GM vaginal cream Commonly known as: ESTRACE VAGINAL Apply 0.5mg  (pea-sized amount)  just inside the vaginal introitus with a finger-tip on Monday, Wednesday and Friday nights.    hydrochlorothiazide 25 MG tablet Commonly known as: HYDRODIURIL TAKE 1 TABLET DAILY   metoprolol tartrate 25 MG tablet Commonly known as: LOPRESSOR TAKE ONE AND ONE-HALF TABLETS TWICE A DAY   nitrofurantoin 100 MG capsule Commonly known as: MACRODANTIN Take 100 mg by mouth at bedtime.   ramipril 10 MG capsule Commonly known as: ALTACE TAKE 1 CAPSULE DAILY   simvastatin 10 MG tablet Commonly known as: ZOCOR TAKE 1 TABLET DAILY        Allergies:  Allergies  Allergen Reactions   Ciprofloxacin Other (See Comments)    Tendinitis    Levaquin [Levofloxacin] Other (See Comments)    insomnia   [name] Diarrhea   [name] Palpitations and Rash    Last reaction was in 1969    Family History: Family History  Problem Relation Age of Onset   Hypertension Mother    Cancer Mother        stomach   Healthy Father    Healthy Brother    Colon cancer Neg Hx    Prostate cancer Neg Hx    Kidney cancer Neg Hx    Bladder Cancer Neg Hx    Breast cancer Neg Hx     Social History:  reports that she has never smoked. She has never used smokeless tobacco. She reports current alcohol use of about 1.0 standard drink per week. She reports that she does not use drugs.  ROS:                                        Physical Exam: BP (!) 171/78   Pulse 67   Ht 5\' 1"  (1.549 m)   Wt 59.4 kg   BMI 24.75 kg/m   Constitutional:  Alert and oriented, No acute distress. HEENT: [place] AT, moist mucus membranes.  Trachea midline, no masses. .  Laboratory Data: Lab Results  Component Value Date   WBC 7.8 07/22/2020   HGB 13.2 07/22/2020   HCT 37.6 07/22/2020   MCV 88 07/22/2020   PLT 224 07/22/2020    Lab Results  Component Value Date   CREATININE 0.79 04/30/2021    No results found for: PSA  No results found for: TESTOSTERONE  Lab Results  Component Value Date   HGBA1C 5.7 (H) 04/30/2021    Urinalysis    Component  Value Date/Time   COLORURINE YELLOW 08/07/2019 1128   APPEARANCEUR Clear 10/30/2019 1055   LABSPEC 1.010 08/07/2019 1128   PHURINE 6.5 08/07/2019 1128   GLUCOSEU Negative 10/30/2019 1055   HGBUR TRACE (A) 08/07/2019 1128   BILIRUBINUR Negative 10/30/2019 1055   KETONESUR NEGATIVE 08/07/2019 1128   PROTEINUR Negative 10/30/2019 1055   PROTEINUR NEGATIVE  08/07/2019 1128   UROBILINOGEN 0.2 01/13/2018 1147   UROBILINOGEN 0.2 05/02/2013 0930   NITRITE Negative 10/30/2019 1055   NITRITE NEGATIVE 08/07/2019 1128   LEUKOCYTESUR Negative 10/30/2019 1055   LEUKOCYTESUR MODERATE (A) 08/07/2019 1128    Pertinent Imaging:   Assessment & Plan: 90x3 Macrodantin sent to pharmacy and I will see in 1 year  There are no diagnoses linked to this encounter.  No follow-ups on file.  Reece Packer, MD  [facility] [address] [place], [place] [address] [phone]

## 2021-05-12 ENCOUNTER — Ambulatory Visit: Payer: Self-pay | Admitting: Urology

## 2021-06-02 ENCOUNTER — Ambulatory Visit: Payer: Medicare HMO | Admitting: Gastroenterology

## 2021-06-05 ENCOUNTER — Telehealth (INDEPENDENT_AMBULATORY_CARE_PROVIDER_SITE_OTHER): Payer: Medicare HMO | Admitting: Gastroenterology

## 2021-06-05 DIAGNOSIS — D649 Anemia, unspecified: Secondary | ICD-10-CM | POA: Diagnosis not present

## 2021-06-05 NOTE — Progress Notes (Signed)
Vonda Antigua, MD [address]  [place]  [place], [place] [address]  Main: [phone]  Fax: [phone]   Primary Care Physician: [name], MD  Virtual Visit via Video Note  I connected with patient on 06/05/21 at  1:30 PM EST by video and verified that I am speaking with the correct person using two identifiers.   I discussed the limitations, risks, security and privacy concerns of performing an evaluation and management service by video and the availability of in person appointments. I also discussed with the patient that there may be a patient responsible charge related to this service. The patient expressed understanding and agreed to proceed.  Location of Patient: Home Location of Provider: Home Persons involved: Patient and provider only (Nursing staff checked in patient via phone but were not physically involved in the video interaction - see their notes)   History of Present Illness: Chief complaint: Abdominal pain  HPI: Rebecca Gutierrez is a 80 y.o. female here for follow-up of abdominal pain.  Denies any further abdominal pain symptoms since her last visit.  States abdominal pain has completely resolved since her cholecystitis.  Reporting 1 formed bowel movement daily, with no loose stools or diarrhea.  Good appetite.  No nausea or vomiting.  No frequent heartburn.  Previous history: in August 2021 she went to the ER at [facility] for abdominal pain and was discharged after conservative management.  Right upper quadrant ultrasound done by her PCP as an outpatient, after that visit showed cholelithiasis without cholecystitis.  Hepatic steatosis.   Denies any heartburn, but does report atypical chest pain and cardiology work-up has been negative.  Takes Tums at night   When she was into the ER in August, she did have elevation in AST ALT to a 81 and 52 respectively.  Normal alk phos and total bilirubin.  However, I do notice that total bilirubin was  elevated mildly to 1.3 in May 2021.   Patient has never had an upper endoscopy.  She does report history of a hiatal hernia.  She has never had an upper endoscopy.   CT abdomen pelvis in 2019-report small hiatal hernia   Last colonoscopy was in Rebecca 2013 with Dr. Laural Golden which according to the note was done for average risk screening and states that "patient's last exam was 11 years ago.  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  Small polyp ablated via cold biopsy from sigmoid colon.  Small external hemorrhoids reported.  Examination performed to cecum.  Cecal withdrawal time 14 minutes.  Excellent prep reported.  Pathology report showed hyperplastic polyp.  10-year follow-up was recommended  Current Outpatient Medications  Medication Sig Dispense Refill   aspirin EC 81 MG tablet Take 81 mg by mouth at bedtime.      Cholecalciferol 50 MCG (2000 UT) TABS Take 1 capsule by mouth in the morning.     estradiol (ESTRACE VAGINAL) 0.1 MG/GM vaginal cream Apply 0.73m (pea-sized amount)  just inside the vaginal introitus with a finger-tip on Monday, Wednesday and Friday nights. 30 g 0   hydrochlorothiazide (HYDRODIURIL) 25 MG tablet TAKE 1 TABLET DAILY 90 tablet 0   metoprolol tartrate (LOPRESSOR) 25 MG tablet TAKE ONE AND ONE-HALF TABLETS TWICE A DAY 270 tablet 1   nitrofurantoin (MACRODANTIN) 100 MG capsule Take 100 mg by mouth at bedtime.     ramipril (ALTACE) 10 MG  capsule TAKE 1 CAPSULE DAILY 90 capsule 1   simvastatin (ZOCOR) 10 MG tablet TAKE 1 TABLET DAILY 90 tablet 3   No current facility-administered medications for this visit.    Allergies as of 06/05/2021 - Review Complete 06/05/2021  Allergen Reaction Noted   Ciprofloxacin Other (See Comments) 05/27/2018   Levaquin [levofloxacin] Other (See Comments) 05/11/2017   [name] Diarrhea 06/24/2015    [name] Palpitations and Rash 06/02/2007    Review of Systems:    All systems reviewed and negative except where noted in HPI.   Observations/Objective:  Labs: CMP     Component Value Date/Time   NA 138 04/30/2021 1058   NA 142 10/13/2011 1515   K 4.0 04/30/2021 1058   K 3.8 10/13/2011 1515   CL 98 04/30/2021 1058   CL 103 10/13/2011 1515   CO2 25 04/30/2021 1058   CO2 33 (H) 10/13/2011 1515   GLUCOSE 97 04/30/2021 1058   GLUCOSE 91 09/25/2015 0822   GLUCOSE 113 (H) 10/13/2011 1515   BUN 16 04/30/2021 1058   BUN 16 10/13/2011 1515   CREATININE 0.79 04/30/2021 1058   CREATININE 0.73 09/25/2015 0822   CALCIUM 10.0 04/30/2021 1058   CALCIUM 9.4 10/13/2011 1515   PROT 6.7 10/28/2020 0912   PROT 7.5 10/13/2011 1515   ALBUMIN 4.4 10/28/2020 0912   ALBUMIN 3.9 10/13/2011 1515   AST 21 10/28/2020 0912   AST 21 10/13/2011 1515   ALT 26 10/28/2020 0912   ALT 28 10/13/2011 1515   ALKPHOS 70 10/28/2020 0912   ALKPHOS 74 10/13/2011 1515   BILITOT 1.1 10/28/2020 0912   BILITOT 1.2 (H) 10/13/2011 1515   GFRNONAA 78 07/22/2020 1153   GFRNONAA 85 03/29/2015 0912   GFRAA 90 07/22/2020 1153   GFRAA >89 03/29/2015 0912   Lab Results  Component Value Date   WBC 7.8 07/22/2020   HGB 13.2 07/22/2020   HCT 37.6 07/22/2020   MCV 88 07/22/2020   PLT 224 07/22/2020    Imaging Studies: No results found.  Assessment and Plan:   Rebecca Gutierrez is a 80 y.o. y/o female here for follow-up of abdominal pain which has completely resolved since her cholecystectomy and is reporting 1 formed bowel movement daily with no altered bowel habits  Assessment and Plan: At this time, I will check hemoglobin to ensure it has improved since her last check in May 2022.  This was around the time of her cholecystectomy and hemoglobin was already improving, with the hematocrit was mildly still low.  Patient is not having any frequent heartburn or any other abdominal symptoms of  concern  If things change patient was advised to call us  Follow Up Instructions: Follow-up in 1 year or earlier if needed   I discussed the assessment and treatment plan with the patient. The patient was provided an opportunity to ask questions and all were answered. The patient agreed with the plan and demonstrated an understanding of the instructions.   The patient was advised to call back or seek an in-person evaluation if the symptoms worsen or if the condition fails to improve as anticipated.  I provided 15 minutes of face-to-face time via video software during this encounter. Additional time was spent in reviewing patient's chart, placing orders etc.   Virgel Manifold, MD  Speech recognition software was used to dictate this note.

## 2021-06-11 LAB — CBC
Hematocrit: 38.1 % (ref 34.0–46.6)
Hemoglobin: 13.4 g/dL (ref 11.1–15.9)
MCH: 30.7 pg (ref 26.6–33.0)
MCHC: 35.2 g/dL (ref 31.5–35.7)
MCV: 87 fL (ref 79–97)
Platelets: 246 10*3/uL (ref 150–450)
RBC: 4.36 x10E6/uL (ref 3.77–5.28)
RDW: 13.1 % (ref 11.7–15.4)
WBC: 6.6 10*3/uL (ref 3.4–10.8)

## 2021-07-03 ENCOUNTER — Ambulatory Visit (INDEPENDENT_AMBULATORY_CARE_PROVIDER_SITE_OTHER): Payer: Medicare Other | Admitting: Internal Medicine

## 2021-07-03 ENCOUNTER — Encounter: Payer: Self-pay | Admitting: Internal Medicine

## 2021-07-03 ENCOUNTER — Other Ambulatory Visit: Payer: Self-pay

## 2021-07-03 VITALS — BP 144/80 | HR 76 | Ht 61.0 in | Wt 133.0 lb

## 2021-07-03 DIAGNOSIS — I10 Essential (primary) hypertension: Secondary | ICD-10-CM

## 2021-07-03 DIAGNOSIS — R002 Palpitations: Secondary | ICD-10-CM

## 2021-07-03 DIAGNOSIS — R059 Cough, unspecified: Secondary | ICD-10-CM

## 2021-07-03 MED ORDER — RAMIPRIL 10 MG PO CAPS: 10.0000 mg | ORAL_CAPSULE | Freq: Every day | ORAL | 1 refills | Status: DC

## 2021-07-03 MED ORDER — AMOXICILLIN 875 MG PO TABS
875.0000 mg | ORAL_TABLET | Freq: Two times a day (BID) | ORAL | 0 refills | Status: AC
Start: 2021-07-03 — End: 2021-07-13

## 2021-07-03 MED ORDER — NITROFURANTOIN MACROCRYSTAL 100 MG PO CAPS: 100.0000 mg | ORAL_CAPSULE | Freq: Every day | ORAL | 3 refills | Status: DC

## 2021-07-03 MED ORDER — METOPROLOL TARTRATE 25 MG PO TABS: ORAL_TABLET | ORAL | 1 refills | Status: DC

## 2021-07-03 NOTE — Progress Notes (Signed)
Date:  07/03/2021   Name:  Rebecca Gutierrez   DOB:  [birth year]   MRN:  [id number]   Chief Complaint: Cough  Cough This is a new problem. The current episode started more than 1 month ago. The problem occurs constantly (when talking). The cough is Productive of sputum. Associated symptoms include a sore throat. Pertinent negatives include no chest pain, chills, ear pain, fever, headaches, shortness of breath or wheezing.  Started in November with a URI and slight nasal drainage.  That improved but she is still having a dry cough that is mostly triggered by talking.  She has scant AM yellow phlegm, no fever, no SOB or wheezing, no sinus pain, no loss of voice.  Lab Results  Component Value Date   NA 138 04/30/2021   K 4.0 04/30/2021   CO2 25 04/30/2021   GLUCOSE 97 04/30/2021   BUN 16 04/30/2021   CREATININE 0.79 04/30/2021   CALCIUM 10.0 04/30/2021   EGFR 77 04/30/2021   GFRNONAA 78 07/22/2020   Lab Results  Component Value Date   CHOL 178 10/28/2020   HDL 53 10/28/2020   LDLCALC 99 10/28/2020   TRIG 152 (H) 10/28/2020   CHOLHDL 3.4 10/28/2020   Lab Results  Component Value Date   TSH 2.300 10/28/2020   Lab Results  Component Value Date   HGBA1C 5.7 (H) 04/30/2021   Lab Results  Component Value Date   WBC 6.6 06/10/2021   HGB 13.4 06/10/2021   HCT 38.1 06/10/2021   MCV 87 06/10/2021   PLT 246 06/10/2021   Lab Results  Component Value Date   ALT 26 10/28/2020   AST 21 10/28/2020   ALKPHOS 70 10/28/2020   BILITOT 1.1 10/28/2020   Lab Results  Component Value Date   VD25OH 32.2 04/01/2016     Review of Systems  Constitutional:  Negative for chills, fatigue and fever.  HENT:  Positive for sore throat. Negative for ear pain, trouble swallowing and voice change.   Respiratory:  Positive for cough. Negative for chest tightness, shortness of breath and wheezing.   Cardiovascular:  Negative for chest pain and palpitations.  Gastrointestinal:        No   nocturnal reflux  Neurological:  Negative for dizziness and headaches.  Psychiatric/Behavioral:  Negative for dysphoric mood and sleep disturbance. The patient is not nervous/anxious.    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   Hearing loss 06/02/2007   Essential hypertension, benign 06/02/2007   Acute bilateral low back pain with bilateral sciatica 06/02/2007    Allergies  Allergen Reactions   Ciprofloxacin Other (See Comments)    Tendinitis    Levaquin [Levofloxacin] Other (See Comments)    insomnia   [name] Diarrhea   [name] Palpitations and Rash    Last reaction was in 1969    Past Surgical History:  Procedure Laterality Date   ABDOMINAL HYSTERECTOMY  2014   partial   ANTERIOR AND POSTERIOR REPAIR N/A 05/02/2013   Procedure: ANTERIOR (CYSTOCELE) AND POSTERIOR REPAIR (RECTOCELE);  Surgeon: [name], MD;  Location: [facility];  Service: Gynecology;  Laterality: N/A;   CATARACT EXTRACTION, BILATERAL  CHOLECYSTECTOMY  10/15/2020   COLONOSCOPY  08/28/2011   Procedure: COLONOSCOPY;  Surgeon: [name], MD;  Location: [facility];  Service: Endoscopy;  Laterality: N/A;  930   EYE SURGERY Bilateral 02/13/2011   other 2013   MOHS SURGERY  06/2016   Removed skin cancer from Rt shin   TUBAL LIGATION     VAGINAL HYSTERECTOMY N/A 05/02/2013   Procedure: HYSTERECTOMY VAGINAL;  Surgeon: [name], MD;  Location: [facility];  Service: Gynecology;  Laterality: N/A;    Social History   Tobacco Use   Smoking status: Never   Smokeless tobacco: Never  Vaping Use   Vaping Use: Never used  Substance Use  Topics   Alcohol use: Yes    Alcohol/week: 1.0 standard drink    Types: 1 Standard drinks or equivalent per week   Drug use: Never     Medication list has been reviewed and updated.  Current Meds  Medication Sig   amoxicillin (AMOXIL) 875 MG tablet Take 1 tablet (875 mg total) by mouth 2 (two) times daily for 10 days.   aspirin EC 81 MG tablet Take 81 mg by mouth at bedtime.    Cholecalciferol 50 MCG (2000 UT) TABS Take 1 capsule by mouth in the morning.   estradiol (ESTRACE VAGINAL) 0.1 MG/GM vaginal cream Apply 0.4m (pea-sized amount)  just inside the vaginal introitus with a finger-tip on Monday, Wednesday and Friday nights.   hydrochlorothiazide (HYDRODIURIL) 25 MG tablet TAKE 1 TABLET DAILY   metoprolol tartrate (LOPRESSOR) 25 MG tablet TAKE ONE AND ONE-HALF TABLETS TWICE A DAY   ramipril (ALTACE) 10 MG capsule Take 1 capsule (10 mg total) by mouth daily.   simvastatin (ZOCOR) 10 MG tablet TAKE 1 TABLET DAILY    PHQ 2/9 Scores 07/03/2021 04/30/2021 10/28/2020 10/21/2020  PHQ - 2 Score 0 0 0 0  PHQ- 9 Score 0 0 0 -    GAD 7 : Generalized Anxiety Score 07/03/2021 04/30/2021 10/28/2020 04/29/2020  Nervous, Anxious, on Edge 0 0 0 0  Control/stop worrying 0 0 0 0  Worry too much - different things 0 0 0 0  Trouble relaxing 0 0 0 0  Restless 0 0 0 0  Easily annoyed or irritable 0 0 0 0  Afraid - awful might happen 0 0 0 0  Total GAD 7 Score 0 0 0 0  Anxiety Difficulty Not difficult at all Not difficult at all Not difficult at all Not difficult at all    BP Readings from Last 3 Encounters:  07/03/21 (!) 144/80  05/05/21 (!) 171/78  04/30/21 128/74    Physical Exam Vitals and nursing note reviewed.  Constitutional:      General: She is not in acute distress.    Appearance: Normal appearance. She is well-developed.  HENT:     Head: Normocephalic and atraumatic.     Nose:     Right Sinus: No maxillary sinus tenderness or frontal sinus tenderness.     Left Sinus: No  maxillary sinus tenderness or frontal sinus tenderness.     Mouth/Throat:     Lips: No lesions.     Mouth: Mucous membranes are moist.     Pharynx: Posterior oropharyngeal erythema present. No oropharyngeal exudate.  Cardiovascular:     Rate and Rhythm: Normal rate and regular rhythm.     Pulses: Normal pulses.  Pulmonary:     Effort: Pulmonary effort is normal. No respiratory distress.     Breath sounds: No  wheezing or rhonchi.  Musculoskeletal:     Cervical back: Normal range of motion.  Lymphadenopathy:     Cervical: No cervical adenopathy.  Skin:    General: Skin is warm and dry.     Findings: No rash.  Neurological:     General: No focal deficit present.     Mental Status: She is alert and oriented to person, place, and time.  Psychiatric:        Mood and Affect: Mood normal.        Behavior: Behavior normal.    Wt Readings from Last 3 Encounters:  07/03/21 133 lb (60.3 kg)  05/05/21 131 lb (59.4 kg)  04/30/21 131 lb (59.4 kg)    BP (!) 144/80 (BP Location: Right Arm, Cuff Size: Normal)    Pulse 76    Ht 5' 1"  (1.549 m)    Wt 133 lb (60.3 kg)    SpO2 97%    BMI 25.13 kg/m   Assessment and Plan: 1. Cough in adult Evidence of PND suggesting possible sinus contribution to vocal cord inflammation triggering cough. Will treat with amox and re-evaluate after course complete. - amoxicillin (AMOXIL) 875 MG tablet; Take 1 tablet (875 mg total) by mouth 2 (two) times daily for 10 days.  Dispense: 20 tablet; Refill: 0  2. Essential hypertension, benign Clinically stable exam with well controlled BP on ACE which she has taken for many years.  This is unlikely to be the cause of her cough. Tolerating medications without side effects at this time. Pt to continue current regimen and low sodium diet; benefits of regular exercise as able discussed.    Partially dictated using Editor, commissioning. Any errors are unintentional.  Halina Maidens, MD [facility]  07/03/2021

## 2021-07-25 ENCOUNTER — Other Ambulatory Visit: Payer: Self-pay

## 2021-07-25 MED ORDER — HYDROCHLOROTHIAZIDE 25 MG PO TABS: 25.0000 mg | ORAL_TABLET | Freq: Every day | ORAL | 0 refills | Status: DC

## 2021-07-28 ENCOUNTER — Other Ambulatory Visit: Payer: Self-pay

## 2021-07-28 MED ORDER — HYDROCHLOROTHIAZIDE 12.5 MG PO TABS: 25.0000 mg | ORAL_TABLET | Freq: Every day | ORAL | 0 refills | Status: DC

## 2021-08-06 ENCOUNTER — Other Ambulatory Visit: Payer: Self-pay

## 2021-08-06 DIAGNOSIS — E785 Hyperlipidemia, unspecified: Secondary | ICD-10-CM

## 2021-08-06 MED ORDER — SIMVASTATIN 10 MG PO TABS: 10.0000 mg | ORAL_TABLET | Freq: Every day | ORAL | 1 refills | Status: DC

## 2021-08-07 ENCOUNTER — Ambulatory Visit: Payer: Self-pay

## 2021-08-07 ENCOUNTER — Other Ambulatory Visit: Payer: Self-pay

## 2021-08-07 MED ORDER — HYDROCHLOROTHIAZIDE 25 MG PO TABS: 25.0000 mg | ORAL_TABLET | Freq: Every day | ORAL | 0 refills | Status: DC

## 2021-08-07 NOTE — Telephone Encounter (Signed)
Noted  KP 

## 2021-08-07 NOTE — Telephone Encounter (Signed)
Pt called, advised her that she is prescribed HCTZ 25mg . The dosage is listed at 12.5mg  2 tabs QD. Pt states she has been taking just 1 25mg  so wants it changed for when she is due for refill she gets back the 25mg . Pt states she is using [facility] now for maintenance medications and needs 90 day supply. Advised her I will send to Dr. [name] to let her know.   Summary: med ?   Seems to be confusion with pt taking this med, hydrochlorothiazide (HYDRODIURIL) 12.5 MG tablet . She says its supposed ot be 25mg , and Im am showing 12.5 mg she is to take twice a day, but she still semms to be conused. Please call back with further instructions on how to take med.

## 2021-08-19 ENCOUNTER — Telehealth: Payer: Self-pay | Admitting: Internal Medicine

## 2021-08-19 NOTE — Telephone Encounter (Signed)
Dose was increased on 08/07/21 ? ?Requested Prescriptions  ?Pending Prescriptions Disp Refills  ?? hydrochlorothiazide (HYDRODIURIL) 12.5 MG tablet [Pharmacy Med Name: hydroCHLOROthiazide 12.5 MG Oral Tablet] 60 tablet 11  ?  Sig: TAKE 2 TABLETS BY MOUTH ONCE  DAILY  ?  ? Cardiovascular: Diuretics - Thiazide Failed - 08/19/2021  7:36 AM  ?  ?  Failed - Last BP in normal range  ?  BP Readings from Last 1 Encounters:  ?07/03/21 (!) 144/80  ?   ?  ?  Passed - Cr in normal range and within 180 days  ?  Creat  ?Date Value Ref Range Status  ?09/25/2015 0.73 0.60 - 0.93 mg/dL Final  ? ?Creatinine, Ser  ?Date Value Ref Range Status  ?04/30/2021 0.79 0.57 - 1.00 mg/dL Final  ?   ?  ?  Passed - K in normal range and within 180 days  ?  Potassium  ?Date Value Ref Range Status  ?04/30/2021 4.0 3.5 - 5.2 mmol/L Final  ?10/13/2011 3.8 3.5 - 5.1 mmol/L Final  ?   ?  ?  Passed - Na in normal range and within 180 days  ?  Sodium  ?Date Value Ref Range Status  ?04/30/2021 138 134 - 144 mmol/L Final  ?10/13/2011 142 136 - 145 mmol/L Final  ?   ?  ?  Passed - Valid encounter within last 6 months  ?  Recent Outpatient Visits   ?      ? 1 month ago Cough in adult  ? [facility] [name], MD  ? 3 months ago Essential hypertension, benign  ? [facility] [name], MD  ? 9 months ago Annual physical exam  ? [facility] [name], MD  ? 1 year ago Essential hypertension, benign  ? [facility] [name], MD  ? 1 year ago Epigastric pain  ? [facility] [name], MD  ?  ?  ?Future Appointments   ?        ? In 6 days [name], MD [place]  ? In 2 months [name] Jesse Sans, MD [facility] [place]  ? In 8 months MacDiarmid, Nicki Reaper, MD [place]  ?  ? ?  ?  ?  ? ? ?

## 2021-08-21 ENCOUNTER — Other Ambulatory Visit: Payer: Self-pay

## 2021-08-21 MED ORDER — HYDROCHLOROTHIAZIDE 25 MG PO TABS: 25.0000 mg | ORAL_TABLET | Freq: Every day | ORAL | 0 refills | Status: DC

## 2021-08-21 NOTE — Telephone Encounter (Signed)
Refill sent.  KP 

## 2021-08-21 NOTE — Telephone Encounter (Signed)
Patient called in to inform Dr [name] that [facility] is waiting for the new Rx for her hydrochlorothiazide (HYDRODIURIL) 25 MG tablet . Per patient it need to be for a 90 day supply please advise  ?

## 2021-08-25 ENCOUNTER — Encounter: Payer: Self-pay | Admitting: Gastroenterology

## 2021-08-25 ENCOUNTER — Ambulatory Visit (INDEPENDENT_AMBULATORY_CARE_PROVIDER_SITE_OTHER): Payer: Medicare Other | Admitting: Gastroenterology

## 2021-08-25 VITALS — BP 157/80 | HR 84 | Temp 97.8°F | Wt 132.0 lb

## 2021-08-25 DIAGNOSIS — K219 Gastro-esophageal reflux disease without esophagitis: Secondary | ICD-10-CM

## 2021-08-25 MED ORDER — PANTOPRAZOLE SODIUM 40 MG PO TBEC: 40.0000 mg | DELAYED_RELEASE_TABLET | Freq: Every day | ORAL | 0 refills | Status: DC

## 2021-08-25 NOTE — Progress Notes (Signed)
? ? ?Primary Care Physician: [name], MD ? ?Primary Gastroenterologist:  Dr. [name] ? ?Chief Complaint  ?Patient presents with  ? Transfer of Care  ?  Dr Bonna Gains pt  ? ? ?HPI: Rebecca Gutierrez is a 81 y.o. female here with a history of being seen by Dr. Bonna Gains before she left.  The patient reports that she has a cough.  The patient has this chronic cough that she states happens when she talks too much on the phone but also happens when she lays down at night.  She states that when the cough starts when she lays down at night she usually takes a Tums and it goes away.  She then reports that she has some mucus in the throat when she wakes up in the morning.  The patient had a CT scan showing a large hiatal hernia.  The patient has come in today to talk about her symptoms and what the possible causes may be. ?The patient denies any unexplained weight loss fevers chills nausea vomiting black stools or bloody stools.  The patient is not presently on a PPI.  He does report that she feels like spasms are coming up her stomach and going up to her chest and has burning in the chest but when discussing whether or not she has heartburn she states she does not have any heartburn ? ?Past Medical History:  ?Diagnosis Date  ? Abnormal ECG 10/28/2017  ? Anxiety   ? Bradycardia 11/10/2017  ? Cancer [facility])   ? skin ca  ? Cystocele 09/13/2013  ? small recurrent, less than before 09/13/13 JVF   ? Gilbert's syndrome   ? Hearing loss 1975  ? History of recurrent urinary tract infection 03/24/2016  ? Hyperlipidemia 2000  ? Hypertension 2000  ? ? ?Current Outpatient Medications  ?Medication Sig Dispense Refill  ? aspirin EC 81 MG tablet Take 81 mg by mouth at bedtime.     ? Cholecalciferol 50 MCG (2000 UT) TABS Take 1 capsule by mouth in the morning.    ? estradiol (ESTRACE VAGINAL) 0.1 MG/GM vaginal cream Apply 0.'5mg'$  (pea-sized amount)  just inside the vaginal introitus with a finger-tip on Monday, Wednesday and Friday  nights. 30 g 0  ? hydrochlorothiazide (HYDRODIURIL) 25 MG tablet Take 1 tablet (25 mg total) by mouth daily. 90 tablet 0  ? metoprolol tartrate (LOPRESSOR) 25 MG tablet TAKE ONE AND ONE-HALF TABLETS TWICE A DAY 270 tablet 1  ? nitrofurantoin (MACRODANTIN) 100 MG capsule Take 100 mg by mouth daily.    ? pantoprazole (PROTONIX) 40 MG tablet Take 1 tablet (40 mg total) by mouth at bedtime. 90 tablet 0  ? ramipril (ALTACE) 10 MG capsule Take 1 capsule (10 mg total) by mouth daily. 90 capsule 1  ? simvastatin (ZOCOR) 10 MG tablet Take 1 tablet (10 mg total) by mouth daily. 90 tablet 1  ? ?No current facility-administered medications for this visit.  ? ? ?Allergies as of 08/25/2021 - Review Complete 08/25/2021  ?Allergen Reaction Noted  ? Ciprofloxacin Other (See Comments) 05/27/2018  ? Levaquin [levofloxacin] Other (See Comments) 05/11/2017  ? [name] Diarrhea 06/24/2015  ? [name] Palpitations and Rash 06/02/2007  ? ? ?ROS: ? ?General: Negative for anorexia, weight loss, fever, chills, fatigue, weakness. ?ENT: Negative for hoarseness, difficulty swallowing , nasal congestion. ?CV: Negative for chest pain, angina, palpitations, dyspnea on exertion, peripheral edema.  ?Respiratory: Negative for dyspnea at rest, dyspnea on exertion, cough, sputum, wheezing.  ?GI: See  history of present illness. ?GU:  Negative for dysuria, hematuria, urinary incontinence, urinary frequency, nocturnal urination.  ?Endo: Negative for unusual weight change.  ?  ?Physical Examination: ? ? BP (!) 157/80   Pulse 84   Temp 97.8 ?F (36.6 ?C) (Oral)   Wt 132 lb (59.9 kg)   BMI 24.94 kg/m?  ? ?General: Well-nourished, well-developed in no acute distress.  ?Eyes: No icterus. Conjunctivae pink. ?Extremities: No lower extremity edema. No clubbing or deformities. ?Neuro: Alert and oriented x 3.  Grossly intact. ?Skin: Warm and dry, no jaundice.   ?Psych: Alert and cooperative, normal mood and affect. ? ?Labs:  ?   ?Imaging Studies: ?No results found. ? ?Assessment and Plan:  ? ?Rebecca Gutierrez is a 81 y.o. y/o female who comes in today with a history of a chronic cough.  It is thought that the patient's cough may be due to reflux.  The patient will be started on a PPI in the evening to see if her symptoms get better.  At that time if they do get better she can consider staying on the medication versus considering antireflux surgery.  I have informed her that if the medication works I would recommend staying on the medication opposed to undergoing any invasive procedure at the age of 81 years old to repair the hiatal hernia.  The patient has been explained the plan and agrees with it. ? ? ? ? ?[name], MD. [facility] ? ? ? Note: This dictation was prepared with Dragon dictation along with smaller phrase technology. Any transcriptional errors that result from this process are unintentional.  ?

## 2021-09-05 ENCOUNTER — Other Ambulatory Visit: Payer: Self-pay | Admitting: Gastroenterology

## 2021-09-05 ENCOUNTER — Telehealth: Payer: Self-pay | Admitting: Internal Medicine

## 2021-09-05 NOTE — Telephone Encounter (Signed)
Pt called in checking on status of hydrochlorothiazide (HYDRODIURIL) 25 MG tablet  90 day supply. I advised her to contact pharmacy, since this shows sent 03/09/. ?

## 2021-09-05 NOTE — Telephone Encounter (Signed)
[facility] called and spoke to Angelica, Pharmacy Technician about the refill(s) Hydrochlorthiazide requested. Advised it was sent on 08/21/21 #90/0 refill(s). She says it was received and today was the day they could file with the insurance, so it should be received by the patient in 5-7 days. Patient called and advised, she verbalized understanding. ?

## 2021-09-18 ENCOUNTER — Telehealth: Payer: Self-pay | Admitting: Gastroenterology

## 2021-09-18 NOTE — Telephone Encounter (Signed)
Patient left vm stating that Dr Allen Norris prescribed her a medication and it is not working well. Patient is requesting tests be done and asking for medical advice. Requests call back. ?

## 2021-09-24 ENCOUNTER — Other Ambulatory Visit: Payer: Self-pay | Admitting: Gastroenterology

## 2021-09-24 DIAGNOSIS — K219 Gastro-esophageal reflux disease without esophagitis: Secondary | ICD-10-CM

## 2021-09-24 NOTE — Telephone Encounter (Signed)
Pt is agreeable to procedure. She has been scheduled for 10/21/21 and instructions release via mychart and mailed to pt... I also went over instructions while on the phone with pt, she expressed understanding  ?

## 2021-10-05 NOTE — H&P (View-Only) (Signed)
? ? ?Primary Care Physician: [name], MD ? ?Primary Gastroenterologist:  Dr. [name] ? ?Chief Complaint  ?Patient presents with  ? Follow-up  ? ? ?HPI: Rebecca Gutierrez is a 81 y.o. female here for follow-up with a history of seeing me about a month ago for a cough with mucus in her throat. The patient was sent to me because it was presumed that her cough may have been from reflux.  The patient was started on a PPI at the last visit and told that if her symptoms improve she may need to stay on the PPI or consider antireflux surgery. ?The patient reports that she takes her PPI before she goes to sleep at night but still wakes up with the same symptoms in the morning and a lot of coughing throughout the day.  She does not have any new information for me but is asking the same questions about what is causing her cough.  This is despite not having the 24 hour pH probe and upper endoscopy that I had recommended at the previous visit. ? ?Past Medical History:  ?Diagnosis Date  ? Abnormal ECG 10/28/2017  ? Anxiety   ? Bradycardia 11/10/2017  ? Cancer [facility])   ? skin ca  ? Cystocele 09/13/2013  ? small recurrent, less than before 09/13/13 JVF   ? Gilbert's syndrome   ? Hearing loss 1975  ? History of recurrent urinary tract infection 03/24/2016  ? Hyperlipidemia 2000  ? Hypertension 2000  ? ? ?Current Outpatient Medications  ?Medication Sig Dispense Refill  ? aspirin EC 81 MG tablet Take 81 mg by mouth at bedtime.     ? Cholecalciferol 50 MCG (2000 UT) TABS Take 1 capsule by mouth in the morning.    ? estradiol (ESTRACE VAGINAL) 0.1 MG/GM vaginal cream Apply 0.'5mg'$  (pea-sized amount)  just inside the vaginal introitus with a finger-tip on Monday, Wednesday and Friday nights. 30 g 0  ? hydrochlorothiazide (HYDRODIURIL) 25 MG tablet Take 1 tablet (25 mg total) by mouth daily. 90 tablet 0  ? metoprolol tartrate (LOPRESSOR) 25 MG tablet TAKE ONE AND ONE-HALF TABLETS TWICE A DAY 270 tablet 1  ? nitrofurantoin  (MACRODANTIN) 100 MG capsule Take 100 mg by mouth daily.    ? pantoprazole (PROTONIX) 40 MG tablet TAKE 1 TABLET BY MOUTH AT  BEDTIME 90 tablet 1  ? ramipril (ALTACE) 10 MG capsule Take 1 capsule (10 mg total) by mouth daily. 90 capsule 1  ? simvastatin (ZOCOR) 10 MG tablet Take 1 tablet (10 mg total) by mouth daily. 90 tablet 1  ? ?No current facility-administered medications for this visit.  ? ? ?Allergies as of 10/06/2021 - Review Complete 10/06/2021  ?Allergen Reaction Noted  ? Ciprofloxacin Other (See Comments) 05/27/2018  ? Levaquin [levofloxacin] Other (See Comments) 05/11/2017  ? [name] Diarrhea 06/24/2015  ? [name] Palpitations and Rash 06/02/2007  ? ? ?ROS: ? ?General: Negative for anorexia, weight loss, fever, chills, fatigue, weakness. ?ENT: Negative for hoarseness, difficulty swallowing , nasal congestion. ?CV: Negative for chest pain, angina, palpitations, dyspnea on exertion, peripheral edema.  ?Respiratory: Negative for dyspnea at rest, dyspnea on exertion, cough, sputum, wheezing.  ?GI: See history of present illness. ?GU:  Negative for dysuria, hematuria, urinary incontinence, urinary frequency, nocturnal urination.  ?Endo: Negative for unusual weight change.  ?  ?Physical Examination: ? ? BP (!) 162/78   Pulse 73   Temp 98 ?F (36.7 ?C) (Oral)   Wt 132 lb (59.9 kg)  BMI 24.94 kg/m?  ? ?General: Well-nourished, well-developed in no acute distress.  ?Eyes: No icterus. Conjunctivae pink. ?Neuro: Alert and oriented x 3.  Grossly intact. ?Skin: Warm and dry, no jaundice.   ?Psych: Alert and cooperative, normal mood and affect. ? ?Labs:  ?  ?Imaging Studies: ?No results found. ? ?Assessment and Plan:  ? ?[name] is a 81 y.o. y/o female who comes in today with the same questions she had at the last visit without any new information. She seems somewhat confused about the order of events needed to confirm or rule out reflux as the cause of her coughing.   The patient has been told that per our discussion at the last visit we will have more data her after she has the pH probe placed and we can monitor her acid level to see if it is correlating with her cough.  The patient will follow up at time of EGD and pH study. ? ? ? ? ?[name], MD. [facility] ? ? ? Note: This dictation was prepared with Dragon dictation along with smaller phrase technology. Any transcriptional errors that result from this process are unintentional.  ?

## 2021-10-05 NOTE — Progress Notes (Signed)
? ? ?Primary Care Physician: [name], MD ? ?Primary Gastroenterologist:  Dr. [name] ? ?Chief Complaint  ?Patient presents with  ? Follow-up  ? ? ?HPI: Rebecca Gutierrez is a 81 y.o. female here for follow-up with a history of seeing me about a month ago for a cough with mucus in her throat. The patient was sent to me because it was presumed that her cough may have been from reflux.  The patient was started on a PPI at the last visit and told that if her symptoms improve she may need to stay on the PPI or consider antireflux surgery. ?The patient reports that she takes her PPI before she goes to sleep at night but still wakes up with the same symptoms in the morning and a lot of coughing throughout the day.  She does not have any new information for me but is asking the same questions about what is causing her cough.  This is despite not having the 24 hour pH probe and upper endoscopy that I had recommended at the previous visit. ? ?Past Medical History:  ?Diagnosis Date  ? Abnormal ECG 10/28/2017  ? Anxiety   ? Bradycardia 11/10/2017  ? Cancer [facility])   ? skin ca  ? Cystocele 09/13/2013  ? small recurrent, less than before 09/13/13 JVF   ? Gilbert's syndrome   ? Hearing loss 1975  ? History of recurrent urinary tract infection 03/24/2016  ? Hyperlipidemia 2000  ? Hypertension 2000  ? ? ?Current Outpatient Medications  ?Medication Sig Dispense Refill  ? aspirin EC 81 MG tablet Take 81 mg by mouth at bedtime.     ? Cholecalciferol 50 MCG (2000 UT) TABS Take 1 capsule by mouth in the morning.    ? estradiol (ESTRACE VAGINAL) 0.1 MG/GM vaginal cream Apply 0.'5mg'$  (pea-sized amount)  just inside the vaginal introitus with a finger-tip on Monday, Wednesday and Friday nights. 30 g 0  ? hydrochlorothiazide (HYDRODIURIL) 25 MG tablet Take 1 tablet (25 mg total) by mouth daily. 90 tablet 0  ? metoprolol tartrate (LOPRESSOR) 25 MG tablet TAKE ONE AND ONE-HALF TABLETS TWICE A DAY 270 tablet 1  ? nitrofurantoin  (MACRODANTIN) 100 MG capsule Take 100 mg by mouth daily.    ? pantoprazole (PROTONIX) 40 MG tablet TAKE 1 TABLET BY MOUTH AT  BEDTIME 90 tablet 1  ? ramipril (ALTACE) 10 MG capsule Take 1 capsule (10 mg total) by mouth daily. 90 capsule 1  ? simvastatin (ZOCOR) 10 MG tablet Take 1 tablet (10 mg total) by mouth daily. 90 tablet 1  ? ?No current facility-administered medications for this visit.  ? ? ?Allergies as of 10/06/2021 - Review Complete 10/06/2021  ?Allergen Reaction Noted  ? Ciprofloxacin Other (See Comments) 05/27/2018  ? Levaquin [levofloxacin] Other (See Comments) 05/11/2017  ? [name] Diarrhea 06/24/2015  ? [name] Palpitations and Rash 06/02/2007  ? ? ?ROS: ? ?General: Negative for anorexia, weight loss, fever, chills, fatigue, weakness. ?ENT: Negative for hoarseness, difficulty swallowing , nasal congestion. ?CV: Negative for chest pain, angina, palpitations, dyspnea on exertion, peripheral edema.  ?Respiratory: Negative for dyspnea at rest, dyspnea on exertion, cough, sputum, wheezing.  ?GI: See history of present illness. ?GU:  Negative for dysuria, hematuria, urinary incontinence, urinary frequency, nocturnal urination.  ?Endo: Negative for unusual weight change.  ?  ?Physical Examination: ? ? BP (!) 162/78   Pulse 73   Temp 98 ?F (36.7 ?C) (Oral)   Wt 132 lb (59.9 kg)  BMI 24.94 kg/m?  ? ?General: Well-nourished, well-developed in no acute distress.  ?Eyes: No icterus. Conjunctivae pink. ?Neuro: Alert and oriented x 3.  Grossly intact. ?Skin: Warm and dry, no jaundice.   ?Psych: Alert and cooperative, normal mood and affect. ? ?Labs:  ?  ?Imaging Studies: ?No results found. ? ?Assessment and Plan:  ? ?Rebecca Gutierrez is a 81 y.o. y/o female who comes in today with the same questions she had at the last visit without any new information. She seems somewhat confused about the order of events needed to confirm or rule out reflux as the cause of her coughing.   The patient has been told that per our discussion at the last visit we will have more data her after she has the pH probe placed and we can monitor her acid level to see if it is correlating with her cough.  The patient will follow up at time of EGD and pH study. ? ? ? ? ?[name], MD. Marval Regal ? ? ? Note: This dictation was prepared with Dragon dictation along with smaller phrase technology. Any transcriptional errors that result from this process are unintentional.  ?

## 2021-10-06 ENCOUNTER — Ambulatory Visit (INDEPENDENT_AMBULATORY_CARE_PROVIDER_SITE_OTHER): Payer: Medicare Other | Admitting: Gastroenterology

## 2021-10-06 ENCOUNTER — Encounter: Payer: Self-pay | Admitting: Gastroenterology

## 2021-10-06 VITALS — BP 162/78 | HR 73 | Temp 98.0°F | Wt 132.0 lb

## 2021-10-06 DIAGNOSIS — K219 Gastro-esophageal reflux disease without esophagitis: Secondary | ICD-10-CM

## 2021-10-06 DIAGNOSIS — R053 Chronic cough: Secondary | ICD-10-CM | POA: Diagnosis not present

## 2021-10-14 ENCOUNTER — Telehealth: Payer: Self-pay | Admitting: Gastroenterology

## 2021-10-14 NOTE — Telephone Encounter (Signed)
Patient called with questions about her colonoscopy and medications. Requesting call back. ?

## 2021-10-14 NOTE — Telephone Encounter (Signed)
Pt wanted to confirm what medications she will need to stop 5 days prior... Gone over with pt and she expressed understanding ?

## 2021-10-21 ENCOUNTER — Ambulatory Visit
Admission: RE | Admit: 2021-10-21 | Discharge: 2021-10-21 | Disposition: A | Discharge status: Home / Self Care | Payer: Medicare Other | Attending: Gastroenterology | Admitting: Gastroenterology

## 2021-10-21 ENCOUNTER — Other Ambulatory Visit: Payer: Self-pay | Admitting: Internal Medicine

## 2021-10-21 ENCOUNTER — Ambulatory Visit: Payer: Medicare Other | Admitting: Certified Registered"

## 2021-10-21 ENCOUNTER — Encounter
Admission: RE | Disposition: A | Discharge status: Home / Self Care | Payer: Self-pay | Source: Home / Self Care | Attending: Gastroenterology

## 2021-10-21 ENCOUNTER — Other Ambulatory Visit: Payer: Self-pay

## 2021-10-21 ENCOUNTER — Encounter: Payer: Self-pay | Admitting: Gastroenterology

## 2021-10-21 DIAGNOSIS — K449 Diaphragmatic hernia without obstruction or gangrene: Secondary | ICD-10-CM | POA: Diagnosis not present

## 2021-10-21 DIAGNOSIS — R12 Heartburn: Secondary | ICD-10-CM | POA: Diagnosis present

## 2021-10-21 DIAGNOSIS — K219 Gastro-esophageal reflux disease without esophagitis: Secondary | ICD-10-CM | POA: Diagnosis not present

## 2021-10-21 HISTORY — PX: ESOPHAGOGASTRODUODENOSCOPY: SHX5428

## 2021-10-21 HISTORY — PX: BRAVO PH STUDY: SHX5421

## 2021-10-21 SURGERY — PH MONITORING, ESOPHAGUS, WIRELESS
Anesthesia: General

## 2021-10-21 MED ORDER — LIDOCAINE HCL (CARDIAC) PF 100 MG/5ML IV SOSY
PREFILLED_SYRINGE | INTRAVENOUS | Status: DC | PRN
  Administered 2021-10-21: 100 mg via INTRAVENOUS

## 2021-10-21 MED ORDER — PROPOFOL 10 MG/ML IV BOLUS
INTRAVENOUS | Status: DC | PRN
Start: 2021-10-21 — End: 2021-10-21
  Administered 2021-10-21: 80 mg via INTRAVENOUS
  Administered 2021-10-21: 120 ug/kg/min via INTRAVENOUS

## 2021-10-21 MED ORDER — PROPOFOL 10 MG/ML IV BOLUS
INTRAVENOUS | Status: AC
  Filled 2021-10-21: qty 20

## 2021-10-21 MED ORDER — LIDOCAINE HCL (PF) 2 % IJ SOLN
INTRAMUSCULAR | Status: AC
  Filled 2021-10-21: qty 5

## 2021-10-21 MED ORDER — SODIUM CHLORIDE 0.9 % IV SOLN: INTRAVENOUS | Status: DC

## 2021-10-21 NOTE — Op Note (Signed)
[facility] ?Gastroenterology ?Patient Name: Rebecca Gutierrez ?Procedure Date: 10/21/2021 8:05 AM ?MRN: [id number] ?Account #: [id number] ?Date of Birth: 1941/02/10 ?Admit Type: Outpatient ?Age: 81 ?Room: [facility] ENDO ROOM 4 ?Gender: Female ?Note Status: Finalized ?Instrument Name: Upper Endoscope [id number] ?Procedure:             Upper GI endoscopy ?Indications:           Indigestion, Heartburn ?Providers:             [name] MD, MD ?Medicines:             Propofol per Anesthesia ?Complications:         No immediate complications. ?Procedure:             Pre-Anesthesia Assessment: ?                       - Prior to the procedure, a History and Physical was  ?                       performed, and patient medications and allergies were  ?                       reviewed. The patient's tolerance of previous  ?                       anesthesia was also reviewed. The risks and benefits  ?                       of the procedure and the sedation options and risks  ?                       were discussed with the patient. All questions were  ?                       answered, and informed consent was obtained. Prior  ?                       Anticoagulants: The patient has taken no previous  ?                       anticoagulant or antiplatelet agents. ASA Grade  ?                       Assessment: II - A patient with mild systemic disease.  ?                       After reviewing the risks and benefits, the patient  ?                       was deemed in satisfactory condition to undergo the  ?                       procedure. ?                       After obtaining informed consent, the endoscope was  ?                       passed under direct vision. Throughout the procedure,  ?  the patient's blood pressure, pulse, and oxygen  ?                       saturations were monitored continuously. The Endoscope  ?                       was introduced through the mouth, and advanced to the  ?                        second part of duodenum. The upper GI endoscopy was  ?                       accomplished without difficulty. The patient tolerated  ?                       the procedure well. ?Findings: ?     A 10 cm hiatal hernia was present. The BRAVO capsule with delivery  ?     system was introduced through the mouth and advanced into the esophagus,  ?     such that the BRAVO pH capsule was positioned 24 cm from the incisors,  ?     which was 6 cm proximal to the GE junction. Suction was applied to the  ?     well of the BRAVO pH capsule to suck in the adjacent mucosa of the  ?     esophagus using the external vacuum pump set at a minimum vacuum  ?     pressure of 550 mmHg for 30 seconds. The BRAVO pH capsule was then  ?     deployed by depressing the plunger on top of the handle to advance the  ?     locking pin into the mucosa, thereby attaching the capsule to the  ?     esophagus. The plunger was then rotated a quarter turn clockwise to  ?     release the capsule from the delivery system. The delivery system was  ?     then withdrawn. Endoscopy was utilized for placement of the probe only. ?     The stomach was normal. ?     The examined duodenum was normal. ?     Biopsies were taken with a cold forceps at the gastroesophageal junction  ?     for histology. ?Impression:            - 10 cm hiatal hernia. ?                       - Normal stomach. ?                       - Normal examined duodenum. ?                       - The BRAVO pH capsule was positioned 24 cm from the  ?                       incisors, which was 6 cm proximal to the GE junction. ?                       - Biopsies were taken with a cold forceps for  ?  histology at the gastroesophageal junction. ?Recommendation:        - Discharge patient to home. ?                       - Resume previous diet. ?                       - Continue present medications. ?Procedure Code(s):     --- Professional --- ?                        [phone], Esophagogastroduodenoscopy, flexible,  ?                       transoral; with biopsy, single or multiple ?                       [id number], Esophagus, gastroesophageal reflux test; with  ?                       mucosal attached telemetry pH electrode placement,  ?                       recording, analysis and interpretation ?Diagnosis Code(s):     --- Professional --- ?                       R12, Heartburn ?                       K30, Functional dyspepsia ?CPT copyright 2019 American Medical Association. All rights reserved. ?The codes documented in this report are preliminary and upon coder review may  ?be revised to meet current compliance requirements. ?[name] MD, MD ?10/21/2021 8:31:36 AM ?This report has been signed electronically. ?Number of Addenda: 0 ?Note Initiated On: 10/21/2021 8:05 AM ?Estimated Blood Loss:  Estimated blood loss: none. ?     [facility] ?

## 2021-10-21 NOTE — Interval H&P Note (Signed)
? ?Rebecca Lame, MD [facility] ?[address] ?[place], [place] [address] ?Phone:[phone] ?Fax : [phone] ? ?Primary Care Physician:  [name], MD ?Primary Gastroenterologist:  Dr. Allen Norris ? ?Pre-Procedure History & Physical: ?HPI:  Rebecca Gutierrez is a 81 y.o. female is here for an endoscopy. ?  ?Past Medical History:  ?Diagnosis Date  ? Abnormal ECG 10/28/2017  ? Anxiety   ? Bradycardia 11/10/2017  ? Cancer [facility]   ? skin ca  ? Cystocele 09/13/2013  ? small recurrent, less than before 09/13/13 JVF   ? Gilbert's syndrome   ? Hearing loss 1975  ? History of recurrent urinary tract infection 03/24/2016  ? Hyperlipidemia 2000  ? Hypertension 2000  ? ? ?Past Surgical History:  ?Procedure Laterality Date  ? ANTERIOR AND POSTERIOR REPAIR N/A 05/02/2013  ? Procedure: ANTERIOR (CYSTOCELE) AND POSTERIOR REPAIR (RECTOCELE);  Surgeon: [name], MD;  Location: [facility] ORS;  Service: Gynecology;  Laterality: N/A;  ? CATARACT EXTRACTION, BILATERAL    ? CHOLECYSTECTOMY  10/15/2020  ? COLONOSCOPY  08/28/2011  ? Procedure: COLONOSCOPY;  Surgeon: [name], MD;  Location: [facility] ENDO SUITE;  Service: Endoscopy;  Laterality: N/A;  930  ? EYE SURGERY Bilateral 02/13/2011  ? other 2013  ? MOHS SURGERY  06/2016  ? Removed skin cancer from Rt shin  ? TUBAL LIGATION    ? VAGINAL HYSTERECTOMY N/A 05/02/2013  ? Procedure: HYSTERECTOMY VAGINAL;  Surgeon: [name], MD;  Location: [facility] ORS;  Service: Gynecology;  Laterality: N/A;  ? ? ?Prior to Admission medications   ?Medication Sig Start Date End Date Taking? Authorizing Provider  ?aspirin EC 81 MG tablet Take 81 mg by mouth at bedtime.    Yes [provider]  ?Cholecalciferol 50 MCG (2000 UT) TABS Take 1 capsule by mouth in the morning.   Yes [provider]  ?estradiol (ESTRACE VAGINAL) 0.1 MG/GM vaginal cream Apply 0.'5mg'$  (pea-sized amount)  just inside the vaginal introitus with a finger-tip on Monday, Wednesday and Friday nights. 04/30/21  Yes  [name], MD  ?hydrochlorothiazide (HYDRODIURIL) 25 MG tablet Take 1 tablet (25 mg total) by mouth daily. 08/21/21  Yes [name], MD  ?metoprolol tartrate (LOPRESSOR) 25 MG tablet TAKE ONE AND ONE-HALF TABLETS TWICE A DAY 07/03/21  Yes [name], MD  ?nitrofurantoin (MACRODANTIN) 100 MG capsule Take 100 mg by mouth daily. 07/04/21  Yes [provider]  ?pantoprazole (PROTONIX) 40 MG tablet TAKE 1 TABLET BY MOUTH AT  BEDTIME 09/09/21  Yes Rebecca Lame, MD  ?ramipril (ALTACE) 10 MG capsule Take 1 capsule (10 mg total) by mouth daily. 07/03/21  Yes [name], MD  ?simvastatin (ZOCOR) 10 MG tablet Take 1 tablet (10 mg total) by mouth daily. 08/06/21  Yes [name], MD  ? ? ?Allergies as of 09/24/2021 - Review Complete 08/25/2021  ?Allergen Reaction Noted  ? Ciprofloxacin Other (See Comments) 05/27/2018  ? Levaquin [levofloxacin] Other (See Comments) 05/11/2017  ? [name] Diarrhea 06/24/2015  ? [name] Palpitations and Rash 06/02/2007  ? ? ?Family History  ?Problem Relation Age of Onset  ? Hypertension Mother   ? Cancer Mother   ?     stomach  ? Healthy Father   ? Healthy Brother   ? Colon cancer Neg Hx   ? Prostate cancer Neg Hx   ? Kidney cancer Neg Hx   ? Bladder Cancer Neg Hx   ? Breast cancer Neg Hx   ? ? ?  Social History  ? ?Socioeconomic History  ? Marital status: Widowed  ?  Spouse name: Not on file  ? Number of children: 2  ? Years of education: 107  ? Highest education level: High school graduate  ?Occupational History  ? Occupation: Retired  ?Tobacco Use  ? Smoking status: Never  ? Smokeless tobacco: Never  ?Vaping Use  ? Vaping Use: Never used  ?Substance and Sexual Activity  ? Alcohol use: Yes  ?  Alcohol/week: 1.0 standard drink  ?  Types: 1 Standard drinks or equivalent per week  ? Drug use: Never  ? Sexual activity: Never  ?  Partners: Male  ?  Birth control/protection: Post-menopausal, Surgical  ?Other Topics Concern  ? Not on  file  ?Social History Narrative  ? Not on file  ? ?Social Determinants of Health  ? ?Financial Resource Strain: Low Risk   ? Difficulty of Paying Living Expenses: Not hard at all  ?Food Insecurity: No Food Insecurity  ? Worried About Charity fundraiser in the Last Year: Never true  ? Ran Out of Food in the Last Year: Never true  ?Transportation Needs: No Transportation Needs  ? Lack of Transportation (Medical): No  ? Lack of Transportation (Non-Medical): No  ?Physical Activity: Insufficiently Active  ? Days of Exercise per Week: 3 days  ? Minutes of Exercise per Session: 30 min  ?Stress: No Stress Concern Present  ? Feeling of Stress : Not at all  ?Social Connections: Socially Isolated  ? Frequency of Communication with Friends and Family: More than three times a week  ? Frequency of Social Gatherings with Friends and Family: Three times a week  ? Attends Religious Services: Never  ? Active Member of Clubs or Organizations: No  ? Attends Archivist Meetings: Never  ? Marital Status: Widowed  ?Intimate Partner Violence: Not At Risk  ? Fear of Current or Ex-Partner: No  ? Emotionally Abused: No  ? Physically Abused: No  ? Sexually Abused: No  ? ? ?Review of Systems: ?See HPI, otherwise negative ROS ? ?Physical Exam: ?BP (!) 178/92   Pulse 75   Temp (!) 96.9 ?F (36.1 ?C) (Temporal)   Resp 18   Ht '5\' 1"'$  (1.549 m)   Wt 59 kg   SpO2 96%   BMI 24.56 kg/m?  ?General:   Alert,  pleasant and cooperative in NAD ?Head:  Normocephalic and atraumatic. ?Neck:  Supple; no masses or thyromegaly. ?Lungs:  Clear throughout to auscultation.    ?Heart:  Regular rate and rhythm. ?Abdomen:  Soft, nontender and nondistended. Normal bowel sounds, without guarding, and without rebound.   ?Neurologic:  Alert and  oriented x4;  grossly normal neurologically. ? ?Impression/Plan: ?Rebecca Gutierrez is here for an endoscopy to be performed for GERD ? ?Risks, benefits, limitations, and alternatives regarding  endoscopy have been  reviewed with the patient.  Questions have been answered.  All parties agreeable. ? ? ?Rebecca Lame, MD  10/21/2021, 7:35 AM ?

## 2021-10-21 NOTE — Brief Op Note (Signed)
GEJ at 30cm ?Diaphram at 35 ?Bravo applied at 24cm ?

## 2021-10-21 NOTE — Anesthesia Preprocedure Evaluation (Signed)
Anesthesia Evaluation  ?Patient identified by MRN, date of birth, ID band ?Patient awake ? ? ? ?Reviewed: ?Allergy & Precautions, H&P , NPO status , Patient's Chart, lab work & pertinent test results, reviewed documented beta blocker date and time  ? ?Airway ?Mallampati: II ? ? ?Neck ROM: full ? ? ? Dental ? ?(+) Poor Dentition ?  ?Pulmonary ?neg pulmonary ROS,  ?  ?Pulmonary exam normal ? ? ? ? ? ? ? Cardiovascular ?Exercise Tolerance: Good ?hypertension, On Medications ?negative cardio ROS ?Normal cardiovascular exam ?Rhythm:regular Rate:Normal ? ? ?  ?Neuro/Psych ?Anxiety  Neuromuscular disease negative psych ROS  ? GI/Hepatic ?negative GI ROS, Neg liver ROS,   ?Endo/Other  ?negative endocrine ROS ? Renal/GU ?negative Renal ROS  ?negative genitourinary ?  ?Musculoskeletal ? ? Abdominal ?  ?Peds ? Hematology ?negative hematology ROS ?(+)   ?Anesthesia Other Findings ?Past Medical History: ?10/28/2017: Abnormal ECG ?No date: Anxiety ?11/10/2017: Bradycardia ?No date: Cancer [facility]) ?    Comment:  skin ca ?09/13/2013: Cystocele ?    Comment:  small recurrent, less than before 09/13/13 JVF  ?No date: Gilbert's syndrome ?1975: Hearing loss ?03/24/2016: History of recurrent urinary tract infection ?2000: Hyperlipidemia ?2000: Hypertension ?Past Surgical History: ?05/02/2013: ANTERIOR AND POSTERIOR REPAIR; N/A ?    Comment:  Procedure: ANTERIOR (CYSTOCELE) AND POSTERIOR REPAIR  ?             (RECTOCELE);  Surgeon: [name], MD;  Location: [facility] ?             ORS;  Service: Gynecology;  Laterality: N/A; ?No date: CATARACT EXTRACTION, BILATERAL ?10/15/2020: CHOLECYSTECTOMY ?08/28/2011: COLONOSCOPY ?    Comment:  Procedure: COLONOSCOPY;  Surgeon: [name], MD;   ?             Location: [facility] ENDO SUITE;  Service: Endoscopy;   ?             Laterality: N/A;  930 ?02/13/2011: EYE SURGERY; Bilateral ?    Comment:  other 2013 ?06/2016: MOHS SURGERY ?    Comment:  Removed skin cancer from Rt  shin ?No date: TUBAL LIGATION ?05/02/2013: VAGINAL HYSTERECTOMY; N/A ?    Comment:  Procedure: HYSTERECTOMY VAGINAL;  Surgeon: Angelyn Punt  ?             Glo Herring, MD;  Location: [facility] ORS;  Service: Gynecology;   ?             Laterality: N/A; ?BMI   ? Body Mass Index: 24.56 kg/m?  ?  ? Reproductive/Obstetrics ?negative OB ROS ? ?  ? ? ? ? ? ? ? ? ? ? ? ? ? ?  ?  ? ? ? ? ? ? ? ? ?Anesthesia Physical ?Anesthesia Plan ? ?ASA: 3 ? ?Anesthesia Plan: General  ? ?Post-op Pain Management:   ? ?Induction:  ? ?PONV Risk Score and Plan:  ? ?Airway Management Planned:  ? ?Additional Equipment:  ? ?Intra-op Plan:  ? ?Post-operative Plan:  ? ?Informed Consent: I have reviewed the patients History and Physical, chart, labs and discussed the procedure including the risks, benefits and alternatives for the proposed anesthesia with the patient or authorized representative who has indicated his/her understanding and acceptance.  ? ? ? ?Dental Advisory Given ? ?Plan Discussed with: CRNA ? ?Anesthesia Plan Comments:   ? ? ? ? ? ? ?Anesthesia Quick Evaluation ? ?

## 2021-10-21 NOTE — Transfer of Care (Signed)
Immediate Anesthesia Transfer of Care Note ? ?Patient: Rebecca Gutierrez ? ?Procedure(s) Performed: BRAVO [place] ?ESOPHAGOGASTRODUODENOSCOPY (EGD) ? ?Patient Location: PACU ? ?Anesthesia Type:General ? ?Level of Consciousness: awake ? ?Airway & Oxygen Therapy: Patient Spontanous Breathing ? ?Post-op Assessment: Report given to RN and Post -op Vital signs reviewed and stable ? ?Post vital signs: Reviewed and stable ? ?Last Vitals:  ?Vitals Value Taken Time  ?BP 106/80 10/21/21 0840  ?Temp 36 0835  ?Pulse 81 10/21/21 0836  ?Resp 12 10/21/21 0840  ?SpO2 99 % 10/21/21 0836  ?Vitals shown include unvalidated device data. ? ?Last Pain:  ?Vitals:  ? 10/21/21 0710  ?TempSrc: Temporal  ?PainSc: 0-No pain  ?   ? ?  ? ?Complications: No notable events documented. ?

## 2021-10-21 NOTE — Anesthesia Postprocedure Evaluation (Signed)
Anesthesia Post Note ? ?Patient: Rena Hunke Vieau ? ?Procedure(s) Performed: BRAVO [place] ?ESOPHAGOGASTRODUODENOSCOPY (EGD) ? ?Patient location during evaluation: PACU ?Anesthesia Type: General ?Level of consciousness: awake and alert ?Pain management: pain level controlled ?Vital Signs Assessment: post-procedure vital signs reviewed and stable ?Respiratory status: spontaneous breathing, nonlabored ventilation, respiratory function stable and patient connected to nasal cannula oxygen ?Cardiovascular status: blood pressure returned to baseline and stable ?Postop Assessment: no apparent nausea or vomiting ?Anesthetic complications: no ? ? ?No notable events documented. ? ? ?Last Vitals:  ?Vitals:  ? 10/21/21 0840 10/21/21 0850  ?BP:    ?Pulse: 81   ?Resp:    ?Temp:    ?SpO2: 98% 97%  ?  ?Last Pain:  ?Vitals:  ? 10/21/21 0850  ?TempSrc:   ?PainSc: 0-No pain  ? ? ?  ?  ?  ?  ?  ?  ? ?Molli Barrows ? ? ? ? ?

## 2021-10-21 NOTE — Progress Notes (Signed)
Coughing improved prior to discharge. Instructions on documenting on the log as well as documenting on the Bravo recorder was reiterated to the patient. All questions were answered.  ?

## 2021-10-22 ENCOUNTER — Encounter: Payer: Self-pay | Admitting: Gastroenterology

## 2021-10-22 ENCOUNTER — Ambulatory Visit: Payer: Self-pay

## 2021-10-22 ENCOUNTER — Ambulatory Visit (INDEPENDENT_AMBULATORY_CARE_PROVIDER_SITE_OTHER): Payer: Medicare Other

## 2021-10-22 DIAGNOSIS — Z1231 Encounter for screening mammogram for malignant neoplasm of breast: Secondary | ICD-10-CM

## 2021-10-22 DIAGNOSIS — Z0000 Encounter for general adult medical examination without abnormal findings: Secondary | ICD-10-CM

## 2021-10-22 NOTE — Telephone Encounter (Signed)
Requested Prescriptions  ?Pending Prescriptions Disp Refills  ?? hydrochlorothiazide (HYDRODIURIL) 25 MG tablet [Pharmacy Med Name: hydroCHLOROthiazide 25 MG Oral Tablet] 90 tablet 3  ?  Sig: TAKE 1 TABLET BY MOUTH DAILY  ?  ? Cardiovascular: Diuretics - Thiazide Failed - 10/21/2021 10:33 PM  ?  ?  Failed - Last BP in normal range  ?  BP Readings from Last 1 Encounters:  ?10/21/21 (!) 205/104  ?   ?  ?  Passed - Cr in normal range and within 180 days  ?  Creat  ?Date Value Ref Range Status  ?09/25/2015 0.73 0.60 - 0.93 mg/dL Final  ? ?Creatinine, Ser  ?Date Value Ref Range Status  ?04/30/2021 0.79 0.57 - 1.00 mg/dL Final  ?   ?  ?  Passed - K in normal range and within 180 days  ?  Potassium  ?Date Value Ref Range Status  ?04/30/2021 4.0 3.5 - 5.2 mmol/L Final  ?10/13/2011 3.8 3.5 - 5.1 mmol/L Final  ?   ?  ?  Passed - Na in normal range and within 180 days  ?  Sodium  ?Date Value Ref Range Status  ?04/30/2021 138 134 - 144 mmol/L Final  ?10/13/2011 142 136 - 145 mmol/L Final  ?   ?  ?  Passed - Valid encounter within last 6 months  ?  Recent Outpatient Visits   ?      ? 3 months ago Cough in adult  ? [facility] [name], MD  ? 5 months ago Essential hypertension, benign  ? [facility] [name], MD  ? 11 months ago Annual physical exam  ? [facility] [name], MD  ? 1 year ago Essential hypertension, benign  ? [facility] [name], MD  ? 1 year ago Epigastric pain  ? [facility] [name], MD  ?  ?  ?Future Appointments   ?        ? In 1 week [name], MD [facility] [place]  ? In 6 months MacDiarmid, Nicki Reaper, MD [place]  ?  ? ?  ?  ?  ? ?

## 2021-10-22 NOTE — Patient Instructions (Signed)
Rebecca Gutierrez , ?Thank you for taking time to come for your Medicare Wellness Visit. I appreciate your ongoing commitment to your health goals. Please review the following plan we discussed and let me know if I can assist you in the future.  ? ?Screening recommendations/referrals: ?Colonoscopy: no longer required ?Mammogram: done 12/19/20 ?Bone Density: done 12/19/20 ?Recommended yearly ophthalmology/optometry visit for glaucoma screening and checkup ?Recommended yearly dental visit for hygiene and checkup ? ?Vaccinations: ?Influenza vaccine: done 03/19/21 ?Pneumococcal vaccine: done 09/17/14 ?Tdap vaccine: done 03/14/14 ?Shingles vaccine: done 01/09/19 & 05/18/19   ?Covid-19:done 06/29/19, 07/20/19, 04/03/20 & 03/11/21 ? ?Conditions/risks identified: Keep up the great work! ? ?Next appointment: Follow up in one year for your annual wellness visit  ? ? ?Preventive Care 101 Years and Older, Female ?Preventive care refers to lifestyle choices and visits with your health care provider that can promote health and wellness. ?What does preventive care include? ?A yearly physical exam. This is also called an annual well check. ?Dental exams once or twice a year. ?Routine eye exams. Ask your health care provider how often you should have your eyes checked. ?Personal lifestyle choices, including: ?Daily care of your teeth and gums. ?Regular physical activity. ?Eating a healthy diet. ?Avoiding tobacco and drug use. ?Limiting alcohol use. ?Practicing safe sex. ?Taking low-dose aspirin every day. ?Taking vitamin and mineral supplements as recommended by your health care provider. ?What happens during an annual well check? ?The services and screenings done by your health care provider during your annual well check will depend on your age, overall health, lifestyle risk factors, and family history of disease. ?Counseling  ?Your health care provider may ask you questions about your: ?Alcohol use. ?Tobacco use. ?Drug use. ?Emotional  well-being. ?Home and relationship well-being. ?Sexual activity. ?Eating habits. ?History of falls. ?Memory and ability to understand (cognition). ?Work and work Statistician. ?Reproductive health. ?Screening  ?You may have the following tests or measurements: ?Height, weight, and BMI. ?Blood pressure. ?Lipid and cholesterol levels. These may be checked every 5 years, or more frequently if you are over 65 years old. ?Skin check. ?Lung cancer screening. You may have this screening every year starting at age 32 if you have a 30-pack-year history of smoking and currently smoke or have quit within the past 15 years. ?Fecal occult blood test (FOBT) of the stool. You may have this test every year starting at age 29. ?Flexible sigmoidoscopy or colonoscopy. You may have a sigmoidoscopy every 5 years or a colonoscopy every 10 years starting at age 84. ?Hepatitis C blood test. ?Hepatitis B blood test. ?Sexually transmitted disease (STD) testing. ?Diabetes screening. This is done by checking your blood sugar (glucose) after you have not eaten for a while (fasting). You may have this done every 1-3 years. ?Bone density scan. This is done to screen for osteoporosis. You may have this done starting at age 83. ?Mammogram. This may be done every 1-2 years. Talk to your health care provider about how often you should have regular mammograms. ?Talk with your health care provider about your test results, treatment options, and if necessary, the need for more tests. ?Vaccines  ?Your health care provider may recommend certain vaccines, such as: ?Influenza vaccine. This is recommended every year. ?Tetanus, diphtheria, and acellular pertussis (Tdap, Td) vaccine. You may need a Td booster every 10 years. ?Zoster vaccine. You may need this after age 7. ?Pneumococcal 13-valent conjugate (PCV13) vaccine. One dose is recommended after age 29. ?Pneumococcal polysaccharide (PPSV23) vaccine. One dose is recommended  after age 44. ?Talk to your  health care provider about which screenings and vaccines you need and how often you need them. ?This information is not intended to replace advice given to you by your health care provider. Make sure you discuss any questions you have with your health care provider. ?Document Released: 06/28/2015 Document Revised: 02/19/2016 Document Reviewed: 04/02/2015 ?Elsevier Interactive Patient Education ? 2017 [place]. ? ?Fall Prevention in the Home ?Falls can cause injuries. They can happen to people of all ages. There are many things you can do to make your home safe and to help prevent falls. ?What can I do on the outside of my home? ?Regularly fix the edges of walkways and driveways and fix any cracks. ?Remove anything that might make you trip as you walk through a door, such as a raised step or threshold. ?Trim any bushes or trees on the path to your home. ?Use bright outdoor lighting. ?Clear any walking paths of anything that might make someone trip, such as rocks or tools. ?Regularly check to see if handrails are loose or broken. Make sure that both sides of any steps have handrails. ?Any raised decks and porches should have guardrails on the edges. ?Have any leaves, snow, or ice cleared regularly. ?Use sand or salt on walking paths during winter. ?Clean up any spills in your garage right away. This includes oil or grease spills. ?What can I do in the bathroom? ?Use night lights. ?Install grab bars by the toilet and in the tub and shower. Do not use towel bars as grab bars. ?Use non-skid mats or decals in the tub or shower. ?If you need to sit down in the shower, use a plastic, non-slip stool. ?Keep the floor dry. Clean up any water that spills on the floor as soon as it happens. ?Remove soap buildup in the tub or shower regularly. ?Attach bath mats securely with double-sided non-slip rug tape. ?Do not have throw rugs and other things on the floor that can make you trip. ?What can I do in the bedroom? ?Use night  lights. ?Make sure that you have a light by your bed that is easy to reach. ?Do not use any sheets or blankets that are too big for your bed. They should not hang down onto the floor. ?Have a firm chair that has side arms. You can use this for support while you get dressed. ?Do not have throw rugs and other things on the floor that can make you trip. ?What can I do in the kitchen? ?Clean up any spills right away. ?Avoid walking on wet floors. ?Keep items that you use a lot in easy-to-reach places. ?If you need to reach something above you, use a strong step stool that has a grab bar. ?Keep electrical cords out of the way. ?Do not use floor polish or wax that makes floors slippery. If you must use wax, use non-skid floor wax. ?Do not have throw rugs and other things on the floor that can make you trip. ?What can I do with my stairs? ?Do not leave any items on the stairs. ?Make sure that there are handrails on both sides of the stairs and use them. Fix handrails that are broken or loose. Make sure that handrails are as long as the stairways. ?Check any carpeting to make sure that it is firmly attached to the stairs. Fix any carpet that is loose or worn. ?Avoid having throw rugs at the top or bottom of the stairs. If you  do have throw rugs, attach them to the floor with carpet tape. ?Make sure that you have a light switch at the top of the stairs and the bottom of the stairs. If you do not have them, ask someone to add them for you. ?What else can I do to help prevent falls? ?Wear shoes that: ?Do not have high heels. ?Have rubber bottoms. ?Are comfortable and fit you well. ?Are closed at the toe. Do not wear sandals. ?If you use a stepladder: ?Make sure that it is fully opened. Do not climb a closed stepladder. ?Make sure that both sides of the stepladder are locked into place. ?Ask someone to hold it for you, if possible. ?Clearly mark and make sure that you can see: ?Any grab bars or handrails. ?First and last  steps. ?Where the edge of each step is. ?Use tools that help you move around (mobility aids) if they are needed. These include: ?Canes. ?Walkers. ?Scooters. ?Crutches. ?Turn on the lights when you go into a dark area. Replac

## 2021-10-22 NOTE — Progress Notes (Signed)
? ?Subjective:  ? Rebecca Gutierrez is a 81 y.o. female who presents for Medicare Annual (Subsequent) preventive examination. ? ?Virtual Visit via Telephone Note ? ?I connected with  [name] on 10/22/21 at  9:45 AM EDT by telephone and verified that I am speaking with the correct person using two identifiers. ? ?Location: ?Patient: home ?Provider: [facility] ?Persons participating in the virtual visit: patient/Nurse Health Advisor ?  ?I discussed the limitations, risks, security and privacy concerns of performing an evaluation and management service by telephone and the availability of in person appointments. The patient expressed understanding and agreed to proceed. ? ?Interactive audio and video telecommunications were attempted between this nurse and patient, however failed, due to patient having technical difficulties OR patient did not have access to video capability.  We continued and completed visit with audio only. ? ?Some vital signs may be absent or patient reported.  ? ?[name], LPN ? ? ?Review of Systems    ? ?Cardiac Risk Factors include: advanced age (>78mn, >>53women);dyslipidemia;hypertension ? ?   ?Objective:  ?  ?Today's Vitals  ? 10/22/21 0953  ?PainSc: 0-No pain  ? ?There is no height or weight on file to calculate BMI. ? ? ?  10/22/2021  ?  9:57 AM 10/21/2021  ?  7:14 AM 10/21/2020  ?  2:22 PM 10/18/2019  ?  1:30 PM 10/10/2018  ?  9:33 AM 10/04/2017  ?  9:11 AM 09/29/2016  ?  9:38 AM  ?Advanced Directives  ?Does Patient Have a Medical Advance Directive? Yes Yes Yes Yes Yes Yes Yes  ?Type of [place]of [place] will [place] will [place] will [place]ount CarmelLiving will Living will;Healthcare Power of [place]andy OaksLiving will Healthcare Power of Attorney  ?Copy of [place]? Yes - validated most recent copy scanned in chart (See row  information) No - copy requested No - copy requested No - copy requested No - copy requested No - copy requested No - copy requested  ? ? ?Current Medications (verified) ?Outpatient Encounter Medications as of 10/22/2021  ?Medication Sig  ? aspirin EC 81 MG tablet Take 81 mg by mouth at bedtime.   ? Cholecalciferol 50 MCG (2000 UT) TABS Take 1 capsule by mouth in the morning.  ? estradiol (ESTRACE VAGINAL) 0.1 MG/GM vaginal cream Apply 0.'5mg'$  (pea-sized amount)  just inside the vaginal introitus with a finger-tip on Monday, Wednesday and Friday nights.  ? hydrochlorothiazide (HYDRODIURIL) 25 MG tablet Take 1 tablet (25 mg total) by mouth daily.  ? metoprolol tartrate (LOPRESSOR) 25 MG tablet TAKE ONE AND ONE-HALF TABLETS TWICE A DAY  ? nitrofurantoin (MACRODANTIN) 100 MG capsule Take 100 mg by mouth daily.  ? pantoprazole (PROTONIX) 40 MG tablet TAKE 1 TABLET BY MOUTH AT  BEDTIME  ? ramipril (ALTACE) 10 MG capsule Take 1 capsule (10 mg total) by mouth daily.  ? simvastatin (ZOCOR) 10 MG tablet Take 1 tablet (10 mg total) by mouth daily.  ? ?No facility-administered encounter medications on file as of 10/22/2021.  ? ? ?Allergies (verified) ?Ciprofloxacin, Levaquin [levofloxacin], [name], and [name]  ? ?History: ?Past Medical History:  ?Diagnosis Date  ? Abnormal ECG 10/28/2017  ? Anxiety   ? Bradycardia 11/10/2017  ? Cancer ([facility]   ? skin ca  ? Cystocele 09/13/2013  ? small recurrent, less than before 09/13/13 JVF   ? Gilbert's syndrome   ? Hearing  loss 1975  ? History of recurrent urinary tract infection 03/24/2016  ? Hyperlipidemia 2000  ? Hypertension 2000  ? ?Past Surgical History:  ?Procedure Laterality Date  ? ANTERIOR AND POSTERIOR REPAIR N/A 05/02/2013  ? Procedure: ANTERIOR (CYSTOCELE) AND POSTERIOR REPAIR (RECTOCELE);  Surgeon: [name], MD;  Location: [facility] ORS;  Service: Gynecology;  Laterality: N/A;  ? BRAVO [place] STUDY N/A 10/21/2021  ? Procedure: BRAVO [place];  Surgeon: [name], MD;  Location: [facility] ENDOSCOPY;  Service: Endoscopy;  Laterality: N/A;  ? CATARACT EXTRACTION, BILATERAL    ? CHOLECYSTECTOMY  10/15/2020  ? COLONOSCOPY  08/28/2011  ? Procedure: COLONOSCOPY;  Surgeon: [name], MD;  Location: [facility] ENDO SUITE;  Service: Endoscopy;  Laterality: N/A;  930  ? ESOPHAGOGASTRODUODENOSCOPY N/A 10/21/2021  ? Procedure: ESOPHAGOGASTRODUODENOSCOPY (EGD);  Surgeon: [name], MD;  Location: [facility] ENDOSCOPY;  Service: Endoscopy;  Laterality: N/A;  ? EYE SURGERY Bilateral 02/13/2011  ? other 2013  ? MOHS SURGERY  06/2016  ? Removed skin cancer from Rt shin  ? TUBAL LIGATION    ? VAGINAL HYSTERECTOMY N/A 05/02/2013  ? Procedure: HYSTERECTOMY VAGINAL;  Surgeon: [name], MD;  Location: [facility] ORS;  Service: Gynecology;  Laterality: N/A;  ? ?Family History  ?Problem Relation Age of Onset  ? Hypertension Mother   ? Cancer Mother   ?     stomach  ? Healthy Father   ? Healthy Brother   ? Colon cancer Neg Hx   ? Prostate cancer Neg Hx   ? Kidney cancer Neg Hx   ? Bladder Cancer Neg Hx   ? Breast cancer Neg Hx   ? ?Social History  ? ?Socioeconomic History  ? Marital status: Widowed  ?  Spouse name: Not on file  ? Number of children: 2  ? Years of education: 77  ? Highest education level: High school graduate  ?Occupational History  ? Occupation: Retired  ?Tobacco Use  ? Smoking status: Never  ? Smokeless tobacco: Never  ?Vaping Use  ? Vaping Use: Never used  ?Substance and Sexual Activity  ? Alcohol use: Yes  ?  Alcohol/week: 1.0 standard drink  ?  Types: 1 Standard drinks or equivalent per week  ? Drug use: Never  ? Sexual activity: Never  ?  Partners: Male  ?  Birth control/protection: Post-menopausal, Surgical  ?Other Topics Concern  ? Not on file  ?Social History Narrative  ? Pt lives alone  ? ?Social Determinants of Health  ? ?Financial Resource Strain: Low Risk   ? Difficulty of Paying Living Expenses: Not hard at all  ?Food Insecurity: No Food Insecurity  ? Worried About Paediatric nurse in the Last Year: Never true  ? Ran Out of Food in the Last Year: Never true  ?Transportation Needs: No Transportation Needs  ? Lack of Transportation (Medical): No  ? Lack of Transportation (Non-Medical): No  ?Physical Activity: Insufficiently Active  ? Days of Exercise per Week: 3 days  ? Minutes of Exercise per Session: 30 min  ?Stress: No Stress Concern Present  ? Feeling of Stress : Not at all  ?Social Connections: Socially Isolated  ? Frequency of Communication with Friends and Family: More than three times a week  ? Frequency of Social Gatherings with Friends and Family: Three times a week  ? Attends Religious Services: Never  ? Active Member of Clubs or Organizations: No  ? Attends Archivist Meetings: Never  ? Marital Status: Widowed  ? ? ?  Tobacco Counseling ?Counseling given: Not Answered ? ? ?Clinical Intake: ? ?Pre-visit preparation completed: Yes ? ?Pain : No/denies pain ?Pain Score: 0-No pain ? ?  ? ?Nutritional Risks: None ?Diabetes: No ? ?How often do you need to have someone help you when you read instructions, pamphlets, or other written materials from your doctor or pharmacy?: 1 - Never ? ?Interpreter Needed?: No ? ?Information entered by :: [name] LPN ? ? ?Activities of Daily Living ? ?  10/22/2021  ?  9:58 AM 07/03/2021  ? 10:15 AM  ?In your present state of health, do you have any difficulty performing the following activities:  ?Hearing? 0 1  ?Vision? 0 0  ?Difficulty concentrating or making decisions? 0 0  ?Walking or climbing stairs? 0 0  ?Dressing or bathing? 0 0  ?Doing errands, shopping? 0 0  ?Preparing Food and eating ? N   ?Using the Toilet? N   ?In the past six months, have you accidently leaked urine? N   ?Do you have problems with loss of bowel control? N   ?Managing your Medications? N   ?Managing your Finances? N   ?Housekeeping or managing your Housekeeping? N   ? ? ?Patient Care Team: ?[name], MD as PCP - General (Internal  Medicine) ?Ree Edman, MD (Dermatology) ?Corey Skains, MD as Consulting Physician (Cardiology) ?[name], MD as Consulting Physician (Gastroenterology) ?Bjorn Loser, MD as Consulting Physician (Urology) ? ?In

## 2021-10-23 LAB — SURGICAL PATHOLOGY

## 2021-10-27 ENCOUNTER — Encounter: Payer: Self-pay | Admitting: Gastroenterology

## 2021-10-30 ENCOUNTER — Ambulatory Visit (INDEPENDENT_AMBULATORY_CARE_PROVIDER_SITE_OTHER): Payer: Medicare Other | Admitting: Internal Medicine

## 2021-10-30 ENCOUNTER — Encounter: Payer: Self-pay | Admitting: Internal Medicine

## 2021-10-30 VITALS — BP 135/78 | HR 88 | Ht 61.0 in | Wt 129.0 lb

## 2021-10-30 DIAGNOSIS — E785 Hyperlipidemia, unspecified: Secondary | ICD-10-CM

## 2021-10-30 DIAGNOSIS — K219 Gastro-esophageal reflux disease without esophagitis: Secondary | ICD-10-CM | POA: Diagnosis not present

## 2021-10-30 DIAGNOSIS — I700 Atherosclerosis of aorta: Secondary | ICD-10-CM | POA: Diagnosis not present

## 2021-10-30 DIAGNOSIS — Z0000 Encounter for general adult medical examination without abnormal findings: Secondary | ICD-10-CM | POA: Diagnosis not present

## 2021-10-30 DIAGNOSIS — R7309 Other abnormal glucose: Secondary | ICD-10-CM

## 2021-10-30 DIAGNOSIS — Z1231 Encounter for screening mammogram for malignant neoplasm of breast: Secondary | ICD-10-CM | POA: Diagnosis not present

## 2021-10-30 DIAGNOSIS — I10 Essential (primary) hypertension: Secondary | ICD-10-CM | POA: Diagnosis not present

## 2021-10-30 NOTE — Progress Notes (Signed)
Date:  10/30/2021   Name:  Rebecca Gutierrez   DOB:  1940/09/21   MRN:  [id number]   Chief Complaint: Annual Exam (Breast exam no pap ) Rebecca Gutierrez is a 81 y.o. female who presents today for her Complete Annual Exam. She feels fairly well. She reports exercising. She reports she is sleeping well. Breast complaints none.  Mammogram: 12/2020 - ordered DEXA: 12/2020 osteopenia Pap smear: discontinued Colonoscopy: aged out  There are no preventive care reminders to display for this patient.  Immunization History  Administered Date(s) Administered   Fluad Quad(high Dose 65+) 04/18/2019, 04/29/2020   Influenza Split 04/01/2015   Influenza Whole 03/25/2006, 05/25/2011   Influenza, High Dose Seasonal PF 03/31/2017, 04/22/2018, 03/19/2021   Influenza,inj,Quad PF,6+ Mos 03/01/2013, 03/14/2014, 03/24/2016   PFIZER(Purple Top)SARS-COV-2 Vaccination 06/29/2019, 07/20/2019, 04/03/2020   Pfizer Covid-19 Vaccine Bivalent Booster 34yr & up 03/11/2021   Pneumococcal Conjugate-13 09/17/2014   Pneumococcal Polysaccharide-23 09/23/2009   Td 02/04/2004, 03/14/2014   Zoster Recombinat (Shingrix) 01/09/2019, 05/18/2019   Zoster, Live 07/17/2010    Hypertension This is a chronic problem. The problem is uncontrolled. Pertinent negatives include no chest pain, headaches, palpitations or shortness of breath. Past treatments include ACE inhibitors, beta blockers and diuretics. The current treatment provides significant improvement. There are no compliance problems.  Hypertensive end-organ damage includes PVD. There is no history of kidney disease, CAD/MI or CVA.  Hyperlipidemia This is a chronic problem. The problem is controlled. Pertinent negatives include no chest pain or shortness of breath. Current antihyperlipidemic treatment includes statins. The current treatment provides significant improvement of lipids.  Gastroesophageal Reflux She complains of coughing (intermittent but much improved).  She reports no abdominal pain, no chest pain or no wheezing. This is a recurrent problem. The problem occurs frequently. Pertinent negatives include no fatigue. She has tried a PPI for the symptoms. Past procedures include an EGD. and recent pH capsule study.  Her cough is much improved but still present intermittently.  She stopped the Protonix because she was not sure about the side effects and whether it was intended to be ongoing.  Lab Results  Component Value Date   NA 138 04/30/2021   K 4.0 04/30/2021   CO2 25 04/30/2021   GLUCOSE 97 04/30/2021   BUN 16 04/30/2021   CREATININE 0.79 04/30/2021   CALCIUM 10.0 04/30/2021   EGFR 77 04/30/2021   GFRNONAA 78 07/22/2020   Lab Results  Component Value Date   CHOL 178 10/28/2020   HDL 53 10/28/2020   LDLCALC 99 10/28/2020   TRIG 152 (H) 10/28/2020   CHOLHDL 3.4 10/28/2020   Lab Results  Component Value Date   TSH 2.300 10/28/2020   Lab Results  Component Value Date   HGBA1C 5.7 (H) 04/30/2021   Lab Results  Component Value Date   WBC 6.6 06/10/2021   HGB 13.4 06/10/2021   HCT 38.1 06/10/2021   MCV 87 06/10/2021   PLT 246 06/10/2021   Lab Results  Component Value Date   ALT 26 10/28/2020   AST 21 10/28/2020   ALKPHOS 70 10/28/2020   BILITOT 1.1 10/28/2020   Lab Results  Component Value Date   VD25OH 32.2 04/01/2016     Review of Systems  Constitutional:  Negative for chills, fatigue and fever.  HENT:  Negative for congestion, hearing loss, tinnitus, trouble swallowing and voice change.   Eyes:  Negative for visual disturbance.  Respiratory:  Positive for cough (intermittent but much improved). Negative for  chest tightness, shortness of breath and wheezing.   Cardiovascular:  Negative for chest pain, palpitations and leg swelling.  Gastrointestinal:  Negative for abdominal pain, constipation, diarrhea and vomiting.       Gerd in the evening relieved by TUMS  Endocrine: Negative for polydipsia and polyuria.   Genitourinary:  Negative for dysuria, frequency, genital sores, vaginal bleeding and vaginal discharge.  Musculoskeletal:  Negative for arthralgias, gait problem and joint swelling.  Skin:  Negative for color change and rash.  Neurological:  Negative for dizziness, tremors, light-headedness and headaches.  Hematological:  Negative for adenopathy. Does not bruise/bleed easily.  Psychiatric/Behavioral:  Negative for dysphoric mood and sleep disturbance. The patient is not nervous/anxious.    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   Hearing loss 06/02/2007   Essential hypertension, benign 06/02/2007   Acute bilateral low back pain with bilateral sciatica 06/02/2007    Allergies  Allergen Reactions   Ciprofloxacin Other (See Comments)    Tendinitis    Levaquin [Levofloxacin] Other (See Comments)    insomnia   [name] Diarrhea   [name] Palpitations and Rash    Last reaction was in 1969    Past Surgical History:  Procedure Laterality Date   ANTERIOR AND POSTERIOR REPAIR N/A 05/02/2013   Procedure: ANTERIOR (CYSTOCELE) AND POSTERIOR REPAIR (RECTOCELE);  Surgeon: [name], MD;  Location: [facility] ORS;  Service: Gynecology;  Laterality: N/A;   BRAVO [place] STUDY N/A 10/21/2021   Procedure: BRAVO [place];  Surgeon: [name], MD;  Location: [facility] ENDOSCOPY;  Service: Endoscopy;  Laterality: N/A;   CATARACT EXTRACTION, BILATERAL     CHOLECYSTECTOMY  10/15/2020   COLONOSCOPY  08/28/2011   Procedure: COLONOSCOPY;  Surgeon: [name], MD;  Location: [facility] ENDO SUITE;  Service: Endoscopy;   Laterality: N/A;  930   ESOPHAGOGASTRODUODENOSCOPY N/A 10/21/2021   Procedure: ESOPHAGOGASTRODUODENOSCOPY (EGD);  Surgeon: [name], MD;  Location: [facility] ENDOSCOPY;  Service: Endoscopy;  Laterality: N/A;   EYE SURGERY Bilateral 02/13/2011   other 2013   MOHS SURGERY  06/2016   Removed skin cancer from Rt shin   TUBAL LIGATION     VAGINAL HYSTERECTOMY N/A 05/02/2013   Procedure: HYSTERECTOMY VAGINAL;  Surgeon: [name], MD;  Location: [facility] ORS;  Service: Gynecology;  Laterality: N/A;    Social History   Tobacco Use   Smoking status: Never   Smokeless tobacco: Never  Vaping Use   Vaping Use: Never used  Substance Use Topics   Alcohol use: Yes    Alcohol/week: 1.0 standard drink    Types: 1 Standard drinks or equivalent per week   Drug use: Never     Medication list has been reviewed and updated.  Current Meds  Medication Sig   aspirin EC 81 MG tablet Take 81 mg by mouth at bedtime.    Cholecalciferol 50 MCG (2000 UT) TABS Take 1 capsule by mouth in the morning.   estradiol (ESTRACE VAGINAL) 0.1 MG/GM vaginal cream Apply 0.27m (pea-sized amount)  just inside the vaginal introitus with a finger-tip on Monday, Wednesday and Friday nights.   hydrochlorothiazide (HYDRODIURIL) 25 MG tablet TAKE 1 TABLET BY  MOUTH DAILY   metoprolol tartrate (LOPRESSOR) 25 MG tablet TAKE ONE AND ONE-HALF TABLETS TWICE A DAY   nitrofurantoin (MACRODANTIN) 100 MG capsule Take 100 mg by mouth daily.   pantoprazole (PROTONIX) 40 MG tablet TAKE 1 TABLET BY MOUTH AT  BEDTIME   ramipril (ALTACE) 10 MG capsule Take 1 capsule (10 mg total) by mouth daily.   simvastatin (ZOCOR) 10 MG tablet Take 1 tablet (10 mg total) by mouth daily.       10/30/2021    9:44 AM 07/03/2021   10:14 AM 04/30/2021   10:34 AM 10/28/2020    8:43 AM  GAD 7 : Generalized Anxiety Score  Nervous, Anxious, on Edge 0 0 0 0  Control/stop worrying 0 0 0 0  Worry too much - different things 0 0 0 0  Trouble relaxing 0 0 0 0   Restless 0 0 0 0  Easily annoyed or irritable 0 0 0 0  Afraid - awful might happen 0 0 0 0  Total GAD 7 Score 0 0 0 0  Anxiety Difficulty Not difficult at all Not difficult at all Not difficult at all Not difficult at all       10/30/2021    9:44 AM  Depression screen PHQ 2/9  Decreased Interest 0  Down, Depressed, Hopeless 0  PHQ - 2 Score 0  Altered sleeping 0  Tired, decreased energy 0  Change in appetite 0  Feeling bad or failure about yourself  0  Trouble concentrating 0  Moving slowly or fidgety/restless 0  Suicidal thoughts 0  PHQ-9 Score 0  Difficult doing work/chores Not difficult at all    BP Readings from Last 3 Encounters:  10/30/21 135/78  10/21/21 (!) 205/104  10/06/21 (!) 162/78    Physical Exam Vitals and nursing note reviewed.  Constitutional:      General: She is not in acute distress.    Appearance: She is well-developed.  HENT:     Head: Normocephalic and atraumatic.     Right Ear: Tympanic membrane and ear canal normal.     Left Ear: Tympanic membrane and ear canal normal.     Nose:     Right Sinus: No maxillary sinus tenderness.     Left Sinus: No maxillary sinus tenderness.  Eyes:     General: No scleral icterus.       Right eye: No discharge.        Left eye: No discharge.     Conjunctiva/sclera: Conjunctivae normal.  Neck:     Thyroid: No thyromegaly.     Vascular: No carotid bruit.  Cardiovascular:     Rate and Rhythm: Normal rate and regular rhythm.     Pulses: Normal pulses.     Heart sounds: Normal heart sounds.  Pulmonary:     Effort: Pulmonary effort is normal. No respiratory distress.     Breath sounds: No wheezing.  Chest:     Comments: Breast exam declined  Abdominal:     General: Bowel sounds are normal.     Palpations: Abdomen is soft.     Tenderness: There is no abdominal tenderness.  Musculoskeletal:     Cervical back: Normal range of motion. No erythema.     Right lower leg: No edema.     Left lower leg: No  edema.  Lymphadenopathy:     Cervical: No cervical adenopathy.  Skin:    General: Skin is warm and dry.     Findings: No rash.  Neurological:     Mental Status: She is alert and oriented to person, place, and time.     Cranial Nerves: No cranial nerve deficit.     Sensory: No sensory deficit.     Deep Tendon Reflexes: Reflexes are normal and symmetric.  Psychiatric:        Attention and Perception: Attention normal.        Mood and Affect: Mood normal.    Wt Readings from Last 3 Encounters:  10/30/21 129 lb (58.5 kg)  10/21/21 130 lb (59 kg)  10/06/21 132 lb (59.9 kg)    BP 135/78 (BP Location: Right Arm, Cuff Size: Normal)   Pulse 88   Ht 5' 1" (1.549 m)   Wt 129 lb (58.5 kg)   SpO2 96%   BMI 24.37 kg/m   Assessment and Plan: 1. Annual physical exam Normal exam. Up to date on screenings and immunizations.  2. Encounter for screening mammogram for breast cancer To be scheduled next month.  3. Essential hypertension, benign Clinically stable exam with well controlled BP. Tolerating medications without side effects at this time. Pt to continue current regimen and low sodium diet; benefits of regular exercise as able discussed. - Comprehensive metabolic panel - TSH  4. Aortic atherosclerosis (HCC) Continue statin therapy - Lipid panel  5. Gastroesophageal reflux disease, unspecified whether esophagitis present Recent EGD showed a large HH but biopsies were negative. pH capsule study inconclusive I recommend she resume Protonix daily for esophagitis and cough prevention  - CBC with Differential/Platelet  6. Hyperlipidemia LDL goal <100 Check labs and advise - Lipid panel  7. Elevated glucose level Will rule out pre-diabetes - Hemoglobin A1c   Partially dictated using Editor, commissioning. Any errors are unintentional.  Halina Maidens, MD [facility]  10/30/2021

## 2021-10-31 LAB — COMPREHENSIVE METABOLIC PANEL
ALT: 143 IU/L — ABNORMAL HIGH (ref 0–32)
AST: 105 IU/L — ABNORMAL HIGH (ref 0–40)
Albumin/Globulin Ratio: 1.2 (ref 1.2–2.2)
Albumin: 4.5 g/dL (ref 3.7–4.7)
Alkaline Phosphatase: 90 IU/L (ref 44–121)
BUN/Creatinine Ratio: 24 (ref 12–28)
BUN: 21 mg/dL (ref 8–27)
Bilirubin Total: 1.7 mg/dL — ABNORMAL HIGH (ref 0.0–1.2)
CO2: 21 mmol/L (ref 20–29)
Calcium: 9.6 mg/dL (ref 8.7–10.3)
Chloride: 99 mmol/L (ref 96–106)
Creatinine, Ser: 0.89 mg/dL (ref 0.57–1.00)
Globulin, Total: 3.8 g/dL (ref 1.5–4.5)
Glucose: 117 mg/dL — ABNORMAL HIGH (ref 70–99)
Potassium: 3.9 mmol/L (ref 3.5–5.2)
Sodium: 136 mmol/L (ref 134–144)
Total Protein: 8.3 g/dL (ref 6.0–8.5)
eGFR: 66 mL/min/{1.73_m2} (ref >59)

## 2021-10-31 LAB — CBC WITH DIFFERENTIAL/PLATELET
Basophils Absolute: 0.1 10*3/uL (ref 0.0–0.2)
Basos: 1 %
EOS (ABSOLUTE): 0.1 10*3/uL (ref 0.0–0.4)
Eos: 2 %
Hematocrit: 41.6 % (ref 34.0–46.6)
Hemoglobin: 14.8 g/dL (ref 11.1–15.9)
Immature Grans (Abs): 0 10*3/uL (ref 0.0–0.1)
Immature Granulocytes: 1 %
Lymphocytes Absolute: 1.1 10*3/uL (ref 0.7–3.1)
Lymphs: 16 %
MCH: 30.2 pg (ref 26.6–33.0)
MCHC: 35.6 g/dL (ref 31.5–35.7)
MCV: 85 fL (ref 79–97)
Monocytes Absolute: 0.5 10*3/uL (ref 0.1–0.9)
Monocytes: 8 %
Neutrophils Absolute: 4.7 10*3/uL (ref 1.4–7.0)
Neutrophils: 72 %
Platelets: 256 10*3/uL (ref 150–450)
RBC: 4.9 x10E6/uL (ref 3.77–5.28)
RDW: 13 % (ref 11.7–15.4)
WBC: 6.5 10*3/uL (ref 3.4–10.8)

## 2021-10-31 LAB — TSH: TSH: 2.07 u[IU]/mL (ref 0.450–4.500)

## 2021-10-31 LAB — HEMOGLOBIN A1C
Est. average glucose Bld gHb Est-mCnc: 114 mg/dL
Hgb A1c MFr Bld: 5.6 % (ref 4.8–5.6)

## 2021-10-31 LAB — LIPID PANEL
Chol/HDL Ratio: 2.9 ratio (ref 0.0–4.4)
Cholesterol, Total: 171 mg/dL (ref 100–199)
HDL: 59 mg/dL (ref >39)
LDL Chol Calc (NIH): 93 mg/dL (ref 0–99)
Triglycerides: 108 mg/dL (ref 0–149)
VLDL Cholesterol Cal: 19 mg/dL (ref 5–40)

## 2021-11-26 ENCOUNTER — Ambulatory Visit: Payer: Medicare Other | Admitting: Gastroenterology

## 2021-11-29 ENCOUNTER — Other Ambulatory Visit: Payer: Self-pay | Admitting: Internal Medicine

## 2021-11-29 DIAGNOSIS — I10 Essential (primary) hypertension: Secondary | ICD-10-CM

## 2021-11-29 DIAGNOSIS — R002 Palpitations: Secondary | ICD-10-CM

## 2021-12-01 NOTE — Telephone Encounter (Signed)
Requested Prescriptions  Pending Prescriptions Disp Refills  . metoprolol tartrate (LOPRESSOR) 25 MG tablet [Pharmacy Med Name: Metoprolol Tartrate 25 MG Oral Tablet] 270 tablet 1    Sig: TAKE 1 AND 1/2 TABLETS BY MOUTH  TWICE DAILY     Cardiovascular:  Beta Blockers Passed - 11/29/2021 12:27 PM      Passed - Last BP in normal range    BP Readings from Last 1 Encounters:  10/30/21 135/78         Passed - Last Heart Rate in normal range    Pulse Readings from Last 1 Encounters:  10/30/21 88         Passed - Valid encounter within last 6 months    Recent Outpatient Visits          1 month ago Annual physical exam   [facility] [name], MD   5 months ago Cough in adult   [facility] [name], MD   7 months ago Essential hypertension, benign   [facility] [name], MD   1 year ago Annual physical exam   [facility] [name], MD   1 year ago Essential hypertension, benign   [facility] [name], MD      Future Appointments            In 5 months [name], Jesse Sans, MD [facility], [place]   In 5 months MacDiarm[place], Nicki Reaper, MD [place]           . ramipril (ALTACE) 10 MG capsule [Pharmacy Med Name: Ramipril 10 MG Oral Capsule] 90 capsule 1    Sig: TAKE 1 CAPSULE BY MOUTH DAILY     Cardiovascular:  ACE Inhibitors Passed - 11/29/2021 12:27 PM      Passed - Cr in normal range and within 180 days    Creat  Date Value Ref Range Status  09/25/2015 0.73 0.60 - 0.93 mg/dL Final   Creatinine, Ser  Date Value Ref Range Status  10/30/2021 0.89 0.57 - 1.00 mg/dL Final         Passed - K in normal range and within 180 days    Potassium  Date Value Ref Range Status  10/30/2021 3.9 3.5 - 5.2 mmol/L Final  10/13/2011 3.8 3.5 - 5.1 mmol/L Final         Passed - Patient is not pregnant      Passed - Last BP in normal range    BP Readings from Last 1  Encounters:  10/30/21 135/78         Passed - Valid encounter within last 6 months    Recent Outpatient Visits          1 month ago Annual physical exam   [facility] [name], MD   5 months ago Cough in adult   [facility] [name], MD   7 months ago Essential hypertension, benign   [facility] [name], MD   1 year ago Annual physical exam   [facility] [name], MD   1 year ago Essential hypertension, benign   [facility] [name], MD      Future Appointments            In 5 months [name], Jesse Sans, MD [facility], [place]   In 5 months [place], Nicki Reaper, [facility]

## 2021-12-05 ENCOUNTER — Telehealth: Payer: Self-pay | Admitting: Internal Medicine

## 2021-12-05 ENCOUNTER — Other Ambulatory Visit: Payer: Self-pay

## 2021-12-05 DIAGNOSIS — R7989 Other specified abnormal findings of blood chemistry: Secondary | ICD-10-CM

## 2021-12-06 LAB — COMPREHENSIVE METABOLIC PANEL
ALT: 123 IU/L — ABNORMAL HIGH (ref 0–32)
AST: 94 IU/L — ABNORMAL HIGH (ref 0–40)
Albumin/Globulin Ratio: 1.2 (ref 1.2–2.2)
Albumin: 4.2 g/dL (ref 3.7–4.7)
Alkaline Phosphatase: 75 IU/L (ref 44–121)
BUN/Creatinine Ratio: 18 (ref 12–28)
BUN: 17 mg/dL (ref 8–27)
Bilirubin Total: 1.2 mg/dL (ref 0.0–1.2)
CO2: 24 mmol/L (ref 20–29)
Calcium: 9.7 mg/dL (ref 8.7–10.3)
Chloride: 101 mmol/L (ref 96–106)
Creatinine, Ser: 0.92 mg/dL (ref 0.57–1.00)
Globulin, Total: 3.6 g/dL (ref 1.5–4.5)
Glucose: 99 mg/dL (ref 70–99)
Potassium: 4.2 mmol/L (ref 3.5–5.2)
Sodium: 142 mmol/L (ref 134–144)
Total Protein: 7.8 g/dL (ref 6.0–8.5)
eGFR: 63 mL/min/{1.73_m2} (ref >59)

## 2021-12-07 ENCOUNTER — Encounter: Payer: Self-pay | Admitting: Internal Medicine

## 2021-12-07 DIAGNOSIS — K760 Fatty (change of) liver, not elsewhere classified: Secondary | ICD-10-CM | POA: Insufficient documentation

## 2021-12-25 ENCOUNTER — Ambulatory Visit
Admission: RE | Admit: 2021-12-25 | Discharge: 2021-12-25 | Disposition: A | Discharge status: Home / Self Care | Payer: Medicare Other | Source: Ambulatory Visit | Attending: Internal Medicine | Admitting: Internal Medicine

## 2021-12-25 DIAGNOSIS — Z1231 Encounter for screening mammogram for malignant neoplasm of breast: Secondary | ICD-10-CM | POA: Insufficient documentation

## 2021-12-26 ENCOUNTER — Other Ambulatory Visit: Payer: Self-pay | Admitting: Internal Medicine

## 2021-12-26 DIAGNOSIS — E785 Hyperlipidemia, unspecified: Secondary | ICD-10-CM

## 2021-12-29 NOTE — Telephone Encounter (Signed)
Requested Prescriptions  Pending Prescriptions Disp Refills  . simvastatin (ZOCOR) 10 MG tablet [Pharmacy Med Name: Simvastatin 10 MG Oral Tablet] 90 tablet 3    Sig: TAKE 1 TABLET BY MOUTH DAILY     Cardiovascular:  Antilipid - Statins Failed - 12/26/2021 10:11 PM      Failed - Lipid Panel in normal range within the last 12 months    Cholesterol, Total  Date Value Ref Range Status  10/30/2021 171 100 - 199 mg/dL Final   LDL Chol Calc (NIH)  Date Value Ref Range Status  10/30/2021 93 0 - 99 mg/dL Final   HDL  Date Value Ref Range Status  10/30/2021 59 >39 mg/dL Final   Triglycerides  Date Value Ref Range Status  10/30/2021 108 0 - 149 mg/dL Final         Passed - Patient is not pregnant      Passed - Valid encounter within last 12 months    Recent Outpatient Visits          2 months ago Annual physical exam   [facility] [name], MD   5 months ago Cough in adult   [place], [place]   8 months ago Essential hypertension, benign   [facility] [name], MD   1 year ago Annual physical exam   [facility] [name], MD   1 year ago Essential hypertension, benign   [facility] [name], MD      Future Appointments            In 1 month [name] Jesse Sans, MD [facility], [place]   In 4 months [name], Jesse Sans, MD [facility], [place]   In 4 months [place], Nicki Reaper, [place]

## 2022-01-16 ENCOUNTER — Other Ambulatory Visit: Payer: Self-pay | Admitting: Internal Medicine

## 2022-01-16 NOTE — Telephone Encounter (Signed)
Requested Prescriptions  Pending Prescriptions Disp Refills  . hydrochlorothiazide (HYDRODIURIL) 25 MG tablet [Pharmacy Med Name: hydroCHLOROthiazide 25 MG Oral Tablet] 90 tablet 1    Sig: TAKE 1 TABLET BY MOUTH DAILY     Cardiovascular: Diuretics - Thiazide Passed - 01/16/2022 10:01 AM      Passed - Cr in normal range and within 180 days    Creat  Date Value Ref Range Status  09/25/2015 0.73 0.60 - 0.93 mg/dL Final   Creatinine, Ser  Date Value Ref Range Status  12/05/2021 0.92 0.57 - 1.00 mg/dL Final         Passed - K in normal range and within 180 days    Potassium  Date Value Ref Range Status  12/05/2021 4.2 3.5 - 5.2 mmol/L Final  10/13/2011 3.8 3.5 - 5.1 mmol/L Final         Passed - Na in normal range and within 180 days    Sodium  Date Value Ref Range Status  12/05/2021 142 134 - 144 mmol/L Final  10/13/2011 142 136 - 145 mmol/L Final         Passed - Last BP in normal range    BP Readings from Last 1 Encounters:  10/30/21 135/78         Passed - Valid encounter within last 6 months    Recent Outpatient Visits          2 months ago Annual physical exam   [facility] [name], MD   6 months ago Cough in adult   [facility] [name], MD   8 months ago Essential hypertension, benign   [facility] [name], MD   1 year ago Annual physical exam   [facility] [name], MD   1 year ago Essential hypertension, benign   [facility] [name], MD      Future Appointments            In 3 weeks [name] Jesse Sans, MD [facility], [place]   In 3 months [name], Jesse Sans, MD [facility], [place]   In 3 months [place], Nicki Reaper, [facility]

## 2022-02-10 ENCOUNTER — Ambulatory Visit (INDEPENDENT_AMBULATORY_CARE_PROVIDER_SITE_OTHER): Payer: Medicare Other | Admitting: Internal Medicine

## 2022-02-10 ENCOUNTER — Encounter: Payer: Self-pay | Admitting: Internal Medicine

## 2022-02-10 VITALS — BP 128/66 | HR 63 | Ht 61.0 in | Wt 130.0 lb

## 2022-02-10 DIAGNOSIS — I10 Essential (primary) hypertension: Secondary | ICD-10-CM

## 2022-02-10 DIAGNOSIS — K760 Fatty (change of) liver, not elsewhere classified: Secondary | ICD-10-CM

## 2022-02-10 NOTE — Progress Notes (Signed)
Date:  02/10/2022   Name:  Rebecca Gutierrez   DOB:  1941-03-20   MRN:  [id number]   Chief Complaint: Fatty Liver (Wants to discuss diagnosis. ) Since the elevated liver tests in May she has cut out more fat from her diet.  She has not had any fried foods or fast food and has been reading labels.  She feels well - no abdominal pain, nausea, change in bowel habits.  She is taking a PPI for GERD with good control of symptoms.  Hypertension This is a chronic problem. The problem is controlled. Pertinent negatives include no chest pain or shortness of breath. Past treatments include ACE inhibitors, beta blockers and diuretics. The current treatment provides significant improvement. There is no history of kidney disease, CAD/MI or CVA.    Lab Results  Component Value Date   NA 142 12/05/2021   K 4.2 12/05/2021   CO2 24 12/05/2021   GLUCOSE 99 12/05/2021   BUN 17 12/05/2021   CREATININE 0.92 12/05/2021   CALCIUM 9.7 12/05/2021   EGFR 63 12/05/2021   GFRNONAA 78 07/22/2020   Lab Results  Component Value Date   CHOL 171 10/30/2021   HDL 59 10/30/2021   LDLCALC 93 10/30/2021   TRIG 108 10/30/2021   CHOLHDL 2.9 10/30/2021   Lab Results  Component Value Date   TSH 2.070 10/30/2021   Lab Results  Component Value Date   HGBA1C 5.6 10/30/2021   Lab Results  Component Value Date   WBC 6.5 10/30/2021   HGB 14.8 10/30/2021   HCT 41.6 10/30/2021   MCV 85 10/30/2021   PLT 256 10/30/2021   Lab Results  Component Value Date   ALT 123 (H) 12/05/2021   AST 94 (H) 12/05/2021   ALKPHOS 75 12/05/2021   BILITOT 1.2 12/05/2021   Lab Results  Component Value Date   VD25OH 32.2 04/01/2016     Review of Systems  Constitutional:  Negative for chills, fatigue and unexpected weight change.  Respiratory:  Negative for chest tightness and shortness of breath.   Cardiovascular:  Negative for chest pain and leg swelling.  Gastrointestinal:  Negative for abdominal distention, abdominal  pain and constipation.  Psychiatric/Behavioral:  Negative for dysphoric mood and sleep disturbance. The patient is not nervous/anxious.     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   Hearing loss 06/02/2007   Essential hypertension, benign 06/02/2007   Acute bilateral low back pain with bilateral sciatica 06/02/2007    Allergies  Allergen Reactions   Ciprofloxacin Other (See Comments)    Tendinitis    Levaquin [Levofloxacin] Other (See Comments)    insomnia   [name] Diarrhea   [name] Palpitations and Rash    Last reaction was in 1969    Past Surgical History:  Procedure Laterality Date   ANTERIOR AND POSTERIOR REPAIR N/A 05/02/2013   Procedure: ANTERIOR (CYSTOCELE) AND POSTERIOR REPAIR (RECTOCELE);  Surgeon: [name], MD;  Location: [facility];  Service: Gynecology;  Laterality: N/A;   BRAVO [place] STUDY N/A 10/21/2021   Procedure: BRAVO [place];  Surgeon: [name], MD;  Location: [facility] ENDOSCOPY;  Service: Endoscopy;  Laterality: N/A;   CATARACT EXTRACTION, BILATERAL     CHOLECYSTECTOMY  10/15/2020   COLONOSCOPY  08/28/2011   Procedure: COLONOSCOPY;  Surgeon: [name], MD;  Location: [facility];  Service: Endoscopy;  Laterality: N/A;  930   ESOPHAGOGASTRODUODENOSCOPY N/A 10/21/2021   Procedure: ESOPHAGOGASTRODUODENOSCOPY (EGD);  Surgeon: [name], MD;  Location: [facility] ENDOSCOPY;  Service: Endoscopy;  Laterality: N/A;   EYE SURGERY Bilateral 02/13/2011   other 2013   MOHS SURGERY  06/2016   Removed skin cancer from Rt shin   TUBAL  LIGATION     VAGINAL HYSTERECTOMY N/A 05/02/2013   Procedure: HYSTERECTOMY VAGINAL;  Surgeon: [name], MD;  Location: [facility];  Service: Gynecology;  Laterality: N/A;    Social History   Tobacco Use   Smoking status: Never   Smokeless tobacco: Never  Vaping Use   Vaping Use: Never used  Substance Use Topics   Alcohol use: Yes    Alcohol/week: 1.0 standard drink of alcohol    Types: 1 Standard drinks or equivalent per week   Drug use: Never     Medication list has been reviewed and updated.  Current Meds  Medication Sig   aspirin EC 81 MG tablet Take 81 mg by mouth at bedtime.    Cholecalciferol 50 MCG (2000 UT) TABS Take 1 capsule by mouth in the morning.   estradiol (ESTRACE VAGINAL) 0.1 MG/GM vaginal cream Apply 0.46m (pea-sized amount)  just inside the vaginal introitus with a finger-tip on Monday, Wednesday and Friday nights.   hydrochlorothiazide (HYDRODIURIL) 25 MG tablet TAKE 1 TABLET BY MOUTH DAILY   metoprolol tartrate (LOPRESSOR) 25 MG tablet TAKE 1 AND 1/2 TABLETS BY MOUTH  TWICE DAILY   nitrofurantoin (MACRODANTIN) 100 MG capsule Take 100 mg by mouth daily.   pantoprazole (PROTONIX) 40 MG tablet TAKE 1 TABLET BY MOUTH AT  BEDTIME   ramipril (ALTACE) 10 MG capsule TAKE 1 CAPSULE BY MOUTH DAILY   simvastatin (ZOCOR) 10 MG tablet TAKE 1 TABLET BY MOUTH DAILY       10/30/2021    9:44 AM 07/03/2021   10:14 AM 04/30/2021   10:34 AM 10/28/2020    8:43 AM  GAD 7 : Generalized Anxiety Score  Nervous, Anxious, on Edge 0 0 0 0  Control/stop worrying 0 0 0 0  Worry too much - different things 0 0 0 0  Trouble relaxing 0 0 0 0  Restless 0 0 0 0  Easily annoyed or irritable 0 0 0 0  Afraid - awful might happen 0 0 0 0  Total GAD 7 Score 0 0 0 0  Anxiety Difficulty Not difficult at all Not difficult at all Not difficult at all Not difficult at all       10/30/2021    9:44 AM 10/22/2021    9:57 AM 07/03/2021   10:14 AM  Depression screen PHQ 2/9  Decreased  Interest 0 0 0  Down, Depressed, Hopeless 0 0 0  PHQ - 2 Score 0 0 0  Altered sleeping 0  0  Tired, decreased energy 0  0  Change in appetite 0  0  Feeling bad or failure about yourself  0  0  Trouble concentrating 0  0  Moving slowly or fidgety/restless 0  0  Suicidal thoughts 0  0  PHQ-9 Score 0  0  Difficult doing work/chores Not difficult at all  Not difficult at all  BP Readings from Last 3 Encounters:  02/10/22 128/66  10/30/21 135/78  10/21/21 (!) 205/104    Physical Exam Vitals and nursing note reviewed.  Constitutional:      General: She is not in acute distress.    Appearance: Normal appearance. She is well-developed.  HENT:     Head: Normocephalic and atraumatic.  Cardiovascular:     Rate and Rhythm: Normal rate and regular rhythm.  Pulmonary:     Effort: Pulmonary effort is normal. No respiratory distress.     Breath sounds: No wheezing or rhonchi.  Abdominal:     General: Abdomen is flat.     Palpations: Abdomen is soft.     Tenderness: There is no abdominal tenderness.  Musculoskeletal:     Cervical back: Normal range of motion.  Lymphadenopathy:     Cervical: No cervical adenopathy.  Skin:    General: Skin is warm and dry.     Findings: No rash.  Neurological:     Mental Status: She is alert and oriented to person, place, and time.  Psychiatric:        Mood and Affect: Mood normal.        Behavior: Behavior normal.     Wt Readings from Last 3 Encounters:  02/10/22 130 lb (59 kg)  10/30/21 129 lb (58.5 kg)  10/21/21 130 lb (59 kg)    BP 128/66   Pulse 63   Ht _0  (1.549 m)   Wt 130 lb (59 kg)   SpO2 96%   BMI 24.56 kg/m   Assessment and Plan: 1. Hepatic steatosis Mildly elevated liver tests 2 months ago.  She has changed her diet to very low fat. She is concerned that one of her medications has caused this and wants to know the next steps. Will repeat labs and refer to GI for further evaluation. - Comprehensive metabolic panel -  Ambulatory referral to Gastroenterology  2. Essential hypertension, benign Clinically stable exam with well controlled BP. Tolerating medications without side effects at this time. Pt to continue current regimen and low sodium diet; benefits of regular exercise as able discussed.   Partially dictated using Editor, commissioning. Any errors are unintentional.  Halina Maidens, MD [facility]  02/10/2022

## 2022-02-11 LAB — COMPREHENSIVE METABOLIC PANEL
ALT: 96 IU/L — ABNORMAL HIGH (ref 0–32)
AST: 70 IU/L — ABNORMAL HIGH (ref 0–40)
Albumin/Globulin Ratio: 1.1 — ABNORMAL LOW (ref 1.2–2.2)
Albumin: 4.1 g/dL (ref 3.8–4.8)
Alkaline Phosphatase: 79 IU/L (ref 44–121)
BUN/Creatinine Ratio: 22 (ref 12–28)
BUN: 20 mg/dL (ref 8–27)
Bilirubin Total: 1.4 mg/dL — ABNORMAL HIGH (ref 0.0–1.2)
CO2: 22 mmol/L (ref 20–29)
Calcium: 9.7 mg/dL (ref 8.7–10.3)
Chloride: 101 mmol/L (ref 96–106)
Creatinine, Ser: 0.93 mg/dL (ref 0.57–1.00)
Globulin, Total: 3.7 g/dL (ref 1.5–4.5)
Glucose: 145 mg/dL — ABNORMAL HIGH (ref 70–99)
Potassium: 3.4 mmol/L — ABNORMAL LOW (ref 3.5–5.2)
Sodium: 139 mmol/L (ref 134–144)
Total Protein: 7.8 g/dL (ref 6.0–8.5)
eGFR: 63 mL/min/{1.73_m2} (ref >59)

## 2022-02-26 ENCOUNTER — Encounter: Payer: Self-pay | Admitting: Gastroenterology

## 2022-02-26 ENCOUNTER — Ambulatory Visit (INDEPENDENT_AMBULATORY_CARE_PROVIDER_SITE_OTHER): Payer: Medicare Other | Admitting: Gastroenterology

## 2022-02-26 VITALS — BP 157/75 | HR 74 | Temp 97.9°F | Wt 130.0 lb

## 2022-02-26 DIAGNOSIS — R748 Abnormal levels of other serum enzymes: Secondary | ICD-10-CM | POA: Diagnosis not present

## 2022-02-26 DIAGNOSIS — K760 Fatty (change of) liver, not elsewhere classified: Secondary | ICD-10-CM

## 2022-02-26 NOTE — Progress Notes (Signed)
Primary Care Physician: [name], MD  Primary Gastroenterologist:  Dr. [name]  Chief Complaint  Patient presents with   Fatty Liver    HPI: Rebecca Gutierrez is a 81 y.o. female here who comes to see me today with a finding of fatty liver.  The patient was found to have abnormal liver enzymes with the liver enzymes showing:  Component     Latest Ref Rng 10/30/2021 12/05/2021 02/10/2022  Total Bilirubin     0.0 - 1.2 mg/dL 1.7 (H)  1.2  1.4 (H)   Alkaline Phosphatase     44 - 121 IU/L 90  75  79   AST     0 - 40 IU/L 105 (H)  94 (H)  70 (H)   ALT     0 - 32 IU/L 143 (H)  123 (H)  96 (H)     Prior to that the patient's liver enzymes were normal.  The patient underwent an ultrasound of the right upper quadrant in February that showed:  IMPRESSION: 1. Cholelithiasis and sludge without sonographic evidence of acute cholecystitis. 2. Unchanged hepatic steatosis.  The patient reports that she has had a 10 pound weight loss because she has cut out fatty foods and sugars from her diet because of the fatty liver diagnosis.  The patient also has had a decrease in her liver enzymes as seen above.  She denies any alcohol abuse and only has about 2 drinks a month.  She states that she drinks rum and coke.  There is no report of any black stools or bloody stools or nausea and vomiting.  She also denies any high risk activity for viral hepatitis.  Past Medical History:  Diagnosis Date   Abnormal ECG 10/28/2017   Anxiety    Bradycardia 11/10/2017   Cancer ([place])    skin ca   Cystocele 09/13/2013   small recurrent, less than before 09/13/13 JVF    Gilbert's syndrome    Hearing loss 1975   History of recurrent urinary tract infection 03/24/2016   Hyperlipidemia 2000   Hypertension 2000    Current Outpatient Medications  Medication Sig Dispense Refill   aspirin EC 81 MG tablet Take 81 mg by mouth at bedtime.      Cholecalciferol 50 MCG (2000 UT) TABS Take 1 capsule by mouth  in the morning.     estradiol (ESTRACE VAGINAL) 0.1 MG/GM vaginal cream Apply 0.'5mg'$  (pea-sized amount)  just inside the vaginal introitus with a finger-tip on Monday, Wednesday and Friday nights. 30 g 0   hydrochlorothiazide (HYDRODIURIL) 25 MG tablet TAKE 1 TABLET BY MOUTH DAILY 90 tablet 1   metoprolol tartrate (LOPRESSOR) 25 MG tablet TAKE 1 AND 1/2 TABLETS BY MOUTH  TWICE DAILY 270 tablet 1   nitrofurantoin (MACRODANTIN) 100 MG capsule Take 100 mg by mouth daily.     pantoprazole (PROTONIX) 40 MG tablet TAKE 1 TABLET BY MOUTH AT  BEDTIME 90 tablet 1   ramipril (ALTACE) 10 MG capsule TAKE 1 CAPSULE BY MOUTH DAILY 90 capsule 1   simvastatin (ZOCOR) 10 MG tablet TAKE 1 TABLET BY MOUTH DAILY 90 tablet 3   No current facility-administered medications for this visit.    Allergies as of 02/26/2022 - Review Complete 02/26/2022  Allergen Reaction Noted   Ciprofloxacin Other (See Comments) 05/27/2018   Levaquin [levofloxacin] Other (See Comments) 05/11/2017   [name] Diarrhea 06/24/2015   [name] Palpitations and Rash 06/02/2007    ROS:  General:  Negative for anorexia, weight loss, fever, chills, fatigue, weakness. ENT: Negative for hoarseness, difficulty swallowing , nasal congestion. CV: Negative for chest pain, angina, palpitations, dyspnea on exertion, peripheral edema.  Respiratory: Negative for dyspnea at rest, dyspnea on exertion, cough, sputum, wheezing.  GI: See history of present illness. GU:  Negative for dysuria, hematuria, urinary incontinence, urinary frequency, nocturnal urination.  Endo: Negative for unusual weight change.    Physical Examination:   BP (!) 157/75   Pulse 74   Temp 97.9 F (36.6 C) (Oral)   Wt 130 lb (59 kg)   BMI 24.56 kg/m   General: Well-nourished, well-developed in no acute distress.  Eyes: No icterus. Conjunctivae pink. Extremities: No lower extremity edema. No clubbing or deformities. Neuro: Alert and oriented  x 3.  Grossly intact. Skin: Warm and dry, no jaundice.   Psych: Alert and cooperative, normal mood and affect.  Labs:    Imaging Studies: No results found.  Assessment and Plan:   Rebecca Gutierrez is a 81 y.o. y/o female who comes in today with a finding of fatty liver with her liver enzymes decreasing steadily.  The patient denies any NSAID use except for baby aspirin.  She also denies any alcohol use.  I have discussed and reassured the patient that her levels are coming down and I suggest continue monitoring the levels prior to doing any extensive work-up.  The patient has blood work planned for November with her PCP.  At that time if her liver enzymes do not continue to decrease then blood work will be sent off for other possible cause of abnormal liver enzymes.  The patient has been explained the plan and agrees with it.     [name], MD. [facility]    Note: This dictation was prepared with Dragon dictation along with smaller phrase technology. Any transcriptional errors that result from this process are unintentional.

## 2022-04-26 ENCOUNTER — Other Ambulatory Visit: Payer: Self-pay | Admitting: Gastroenterology

## 2022-05-01 ENCOUNTER — Ambulatory Visit (INDEPENDENT_AMBULATORY_CARE_PROVIDER_SITE_OTHER): Payer: Medicare Other | Admitting: Internal Medicine

## 2022-05-01 ENCOUNTER — Encounter: Payer: Self-pay | Admitting: Internal Medicine

## 2022-05-01 VITALS — BP 128/76 | HR 88 | Temp 98.3°F | Ht 61.0 in | Wt 129.2 lb

## 2022-05-01 DIAGNOSIS — J0100 Acute maxillary sinusitis, unspecified: Secondary | ICD-10-CM

## 2022-05-01 MED ORDER — AZITHROMYCIN 250 MG PO TABS: ORAL_TABLET | ORAL | 0 refills | Status: AC

## 2022-05-01 NOTE — Patient Instructions (Addendum)
Take sudafed 30 mg - one in the AM, one mid afternoon.  Take Mucinex as instructed as instructed on the package.  Push fluids

## 2022-05-01 NOTE — Progress Notes (Signed)
Date:  05/01/2022   Name:  Rebecca Gutierrez   DOB:  Sep 12, 1940   MRN:  [id number]   Chief Complaint: Cough  URI  This is a new problem. The current episode started 1 to 4 weeks ago. The problem has been waxing and waning. There has been no fever. Associated symptoms include congestion, coughing, diarrhea, sinus pain and a sore throat. Pertinent negatives include no chest pain, ear pain, headaches or wheezing. She has tried decongestant for the symptoms. The treatment provided mild relief.    Lab Results  Component Value Date   NA 139 02/10/2022   K 3.4 (L) 02/10/2022   CO2 22 02/10/2022   GLUCOSE 145 (H) 02/10/2022   BUN 20 02/10/2022   CREATININE 0.93 02/10/2022   CALCIUM 9.7 02/10/2022   EGFR 63 02/10/2022   GFRNONAA 78 07/22/2020   Lab Results  Component Value Date   CHOL 171 10/30/2021   HDL 59 10/30/2021   LDLCALC 93 10/30/2021   TRIG 108 10/30/2021   CHOLHDL 2.9 10/30/2021   Lab Results  Component Value Date   TSH 2.070 10/30/2021   Lab Results  Component Value Date   HGBA1C 5.6 10/30/2021   Lab Results  Component Value Date   WBC 6.5 10/30/2021   HGB 14.8 10/30/2021   HCT 41.6 10/30/2021   MCV 85 10/30/2021   PLT 256 10/30/2021   Lab Results  Component Value Date   ALT 96 (H) 02/10/2022   AST 70 (H) 02/10/2022   ALKPHOS 79 02/10/2022   BILITOT 1.4 (H) 02/10/2022   Lab Results  Component Value Date   VD25OH 32.2 04/01/2016     Review of Systems  Constitutional:  Negative for chills, fatigue, fever and unexpected weight change.  HENT:  Positive for congestion, sinus pain and sore throat. Negative for ear pain.   Respiratory:  Positive for cough. Negative for chest tightness, shortness of breath and wheezing.   Cardiovascular:  Negative for chest pain and palpitations.  Gastrointestinal:  Positive for diarrhea.  Neurological:  Negative for headaches.  Psychiatric/Behavioral:  Negative for dysphoric mood and sleep disturbance. The patient is  not nervous/anxious.     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   Hearing loss 06/02/2007   Essential hypertension, benign 06/02/2007   Acute bilateral low back pain with bilateral sciatica 06/02/2007    Allergies  Allergen Reactions   Ciprofloxacin Other (See Comments)    Tendinitis    Levaquin [Levofloxacin] Other (See Comments)    insomnia   [name] Diarrhea   [name] Palpitations and Rash    Last reaction was in 1969    Past Surgical History:  Procedure Laterality Date   ANTERIOR AND POSTERIOR REPAIR N/A 05/02/2013   Procedure: ANTERIOR (CYSTOCELE) AND POSTERIOR REPAIR (RECTOCELE);  Surgeon: [name], MD;  Location: [facility];  Service: Gynecology;  Laterality: N/A;   BRAVO [place] STUDY N/A 10/21/2021   Procedure: BRAVO [place];  Surgeon: [name], MD;  Location: [facility] ENDOSCOPY;  Service: Endoscopy;  Laterality: N/A;   CATARACT EXTRACTION, BILATERAL     CHOLECYSTECTOMY  10/15/2020  COLONOSCOPY  08/28/2011   Procedure: COLONOSCOPY;  Surgeon: [name], MD;  [facility];  Service: Endoscopy;  Laterality: N/A;  930   ESOPHAGOGASTRODUODENOSCOPY N/A 10/21/2021   Procedure: ESOPHAGOGASTRODUODENOSCOPY (EGD);  Surgeon: [name], MD;  Location: [facility] ENDOSCOPY;  Service: Endoscopy;  Laterality: N/A;   EYE SURGERY Bilateral 02/13/2011   other 2013   MOHS SURGERY  06/2016   Removed skin cancer from Rt shin   TUBAL LIGATION     VAGINAL HYSTERECTOMY N/A 05/02/2013   Procedure: HYSTERECTOMY VAGINAL;  Surgeon: [name], MD;   Location: [facility];  Service: Gynecology;  Laterality: N/A;    Social History   Tobacco Use   Smoking status: Never   Smokeless tobacco: Never  Vaping Use   Vaping Use: Never used  Substance Use Topics   Alcohol use: Yes    Alcohol/week: 1.0 standard drink of alcohol    Types: 1 Standard drinks or equivalent per week   Drug use: Never     Medication list has been reviewed and updated.  Current Meds  Medication Sig   aspirin EC 81 MG tablet Take 81 mg by mouth at bedtime.    Cholecalciferol 50 MCG (2000 UT) TABS Take 1 capsule by mouth in the morning.   estradiol (ESTRACE VAGINAL) 0.1 MG/GM vaginal cream Apply 0.107m (pea-sized amount)  just inside the vaginal introitus with a finger-tip on Monday, Wednesday and Friday nights.   hydrochlorothiazide (HYDRODIURIL) 25 MG tablet TAKE 1 TABLET BY MOUTH DAILY   metoprolol tartrate (LOPRESSOR) 25 MG tablet TAKE 1 AND 1/2 TABLETS BY MOUTH  TWICE DAILY   nitrofurantoin (MACRODANTIN) 100 MG capsule Take 100 mg by mouth daily.   pantoprazole (PROTONIX) 40 MG tablet TAKE 1 TABLET BY MOUTH AT  BEDTIME   ramipril (ALTACE) 10 MG capsule TAKE 1 CAPSULE BY MOUTH DAILY   simvastatin (ZOCOR) 10 MG tablet TAKE 1 TABLET BY MOUTH DAILY       05/01/2022    8:52 AM 10/30/2021    9:44 AM 07/03/2021   10:14 AM 04/30/2021   10:34 AM  GAD 7 : Generalized Anxiety Score  Nervous, Anxious, on Edge 0 0 0 0  Control/stop worrying 0 0 0 0  Worry too much - different things 0 0 0 0  Trouble relaxing 0 0 0 0  Restless 0 0 0 0  Easily annoyed or irritable 0 0 0 0  Afraid - awful might happen 0 0 0 0  Total GAD 7 Score 0 0 0 0  Anxiety Difficulty Not difficult at all Not difficult at all Not difficult at all Not difficult at all       05/01/2022    8:52 AM 10/30/2021    9:44 AM 10/22/2021    9:57 AM  Depression screen PHQ 2/9  Decreased Interest 0 0 0  Down, Depressed, Hopeless 0 0 0  PHQ - 2 Score 0 0 0  Altered sleeping 0 0   Tired, decreased energy  0 0   Change in appetite 0 0   Feeling bad or failure about yourself  0 0   Trouble concentrating 0 0   Moving slowly or fidgety/restless 0 0   Suicidal thoughts 0 0   PHQ-9 Score 0 0   Difficult doing work/chores Not difficult at all Not difficult at all     BP Readings from Last 3 Encounters:  05/01/22 128/76  02/26/22 (!) 157/75  02/10/22 128/66    Physical Exam Constitutional:  Appearance: She is well-developed.  HENT:     Nose:     Right Sinus: Maxillary sinus tenderness and frontal sinus tenderness present.     Left Sinus: Maxillary sinus tenderness and frontal sinus tenderness present.     Mouth/Throat:     Mouth: No oral lesions.     Pharynx: Uvula midline. Posterior oropharyngeal erythema present. No oropharyngeal exudate.  Cardiovascular:     Rate and Rhythm: Normal rate and regular rhythm.     Heart sounds: Normal heart sounds.  Pulmonary:     Effort: Pulmonary effort is normal.     Breath sounds: Normal breath sounds. No decreased breath sounds, wheezing or rales.  Lymphadenopathy:     Cervical: No cervical adenopathy.  Neurological:     Mental Status: She is alert and oriented to person, place, and time.     Wt Readings from Last 3 Encounters:  05/01/22 129 lb 3.2 oz (58.6 kg)  02/26/22 130 lb (59 kg)  02/10/22 130 lb (59 kg)    BP 128/76 (BP Location: Left Arm, Cuff Size: Normal)   Pulse 88   Temp 98.3 F (36.8 C) (Oral)   Ht _0  (1.549 m)   Wt 129 lb 3.2 oz (58.6 kg)   SpO2 94%   BMI 24.41 kg/m   Assessment and Plan: 1. Acute non-recurrent maxillary sinusitis Continue Mucinex and sudafed 30 mg bid Push fluids - azithromycin (ZITHROMAX Z-PAK) 250 MG tablet; UAD  Dispense: 6 each; Refill: 0   Partially dictated using Editor, commissioning. Any errors are unintentional.  Halina Maidens, MD [facility]  05/01/2022

## 2022-05-04 ENCOUNTER — Encounter: Payer: Self-pay | Admitting: Internal Medicine

## 2022-05-04 ENCOUNTER — Ambulatory Visit (INDEPENDENT_AMBULATORY_CARE_PROVIDER_SITE_OTHER): Payer: Medicare Other | Admitting: Internal Medicine

## 2022-05-04 VITALS — BP 154/78 | HR 89 | Ht 61.0 in | Wt 128.0 lb

## 2022-05-04 DIAGNOSIS — I10 Essential (primary) hypertension: Secondary | ICD-10-CM | POA: Diagnosis not present

## 2022-05-04 DIAGNOSIS — J0100 Acute maxillary sinusitis, unspecified: Secondary | ICD-10-CM

## 2022-05-04 DIAGNOSIS — K760 Fatty (change of) liver, not elsewhere classified: Secondary | ICD-10-CM | POA: Diagnosis not present

## 2022-05-04 MED ORDER — METOPROLOL TARTRATE 25 MG PO TABS: ORAL_TABLET | ORAL | 1 refills | Status: DC

## 2022-05-04 MED ORDER — HYDROCHLOROTHIAZIDE 25 MG PO TABS: 25.0000 mg | ORAL_TABLET | Freq: Every day | ORAL | 1 refills | Status: DC

## 2022-05-04 MED ORDER — RAMIPRIL 10 MG PO CAPS: 10.0000 mg | ORAL_CAPSULE | Freq: Every day | ORAL | 1 refills | Status: DC

## 2022-05-04 NOTE — Patient Instructions (Signed)
Make an appointment with Nurse for Covid vaccine and Pneumonia vaccine if desired.  On separate days.

## 2022-05-04 NOTE — Progress Notes (Signed)
Date:  05/04/2022   Name:  Rebecca Gutierrez   DOB:  1940-12-17   MRN:  [id number]   Chief Complaint: Hypertension  Hypertension This is a chronic problem. The problem is controlled. Pertinent negatives include no chest pain, headaches, palpitations or shortness of breath. Past treatments include ACE inhibitors, beta blockers and diuretics. There is no history of kidney disease, CAD/MI or CVA.  Sinus Problem This is a new problem. The current episode started in the past 7 days. The problem has been gradually improving (since starting antibiotics) since onset. Associated symptoms include congestion, coughing and sinus pressure. Pertinent negatives include no chills, headaches or shortness of breath. Improvement on treatment: she is concerned there is mold or something else in her house causing her recurrent fall symptoms.  Elevated LFTs - [name] last year showed hepatic steatosis and gall stones.  She was seen by GI - recommended diet and weight loss.  Subsequently underwent cholecystectomy.  Follow up in September 2023 showed improvement in labs.  The plan at that time is to continue to monitor, work on diet and follow up with GI if values do not continue to decrease.  Lab Results  Component Value Date   NA 139 02/10/2022   K 3.4 (L) 02/10/2022   CO2 22 02/10/2022   GLUCOSE 145 (H) 02/10/2022   BUN 20 02/10/2022   CREATININE 0.93 02/10/2022   CALCIUM 9.7 02/10/2022   EGFR 63 02/10/2022   GFRNONAA 78 07/22/2020   Lab Results  Component Value Date   CHOL 171 10/30/2021   HDL 59 10/30/2021   LDLCALC 93 10/30/2021   TRIG 108 10/30/2021   CHOLHDL 2.9 10/30/2021   Lab Results  Component Value Date   TSH 2.070 10/30/2021   Lab Results  Component Value Date   HGBA1C 5.6 10/30/2021   Lab Results  Component Value Date   WBC 6.5 10/30/2021   HGB 14.8 10/30/2021   HCT 41.6 10/30/2021   MCV 85 10/30/2021   PLT 256 10/30/2021   Lab Results  Component Value Date   ALT 96 (H)  02/10/2022   AST 70 (H) 02/10/2022   ALKPHOS 79 02/10/2022   BILITOT 1.4 (H) 02/10/2022   Lab Results  Component Value Date   VD25OH 32.2 04/01/2016     Review of Systems  Constitutional:  Negative for chills, fatigue, fever and unexpected weight change.  HENT:  Positive for congestion and sinus pressure. Negative for nosebleeds.   Eyes:  Negative for visual disturbance.  Respiratory:  Positive for cough. Negative for chest tightness, shortness of breath and wheezing.   Cardiovascular:  Negative for chest pain, palpitations and leg swelling.  Gastrointestinal:  Negative for abdominal pain, constipation and diarrhea.  Neurological:  Negative for dizziness, weakness, light-headedness and headaches.  Psychiatric/Behavioral:  Negative for dysphoric mood and sleep disturbance. The patient is nervous/anxious.     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   Hearing loss 06/02/2007   Essential hypertension, benign 06/02/2007    Allergies  Allergen Reactions   Ciprofloxacin Other (See Comments)    Tendinitis    Levaquin [Levofloxacin] Other (See Comments)  insomnia   [name] Diarrhea   [name] Palpitations and Rash    Last reaction was in 1969    Past Surgical History:  Procedure Laterality Date   ANTERIOR AND POSTERIOR REPAIR N/A 05/02/2013   Procedure: ANTERIOR (CYSTOCELE) AND POSTERIOR REPAIR (RECTOCELE);  Surgeon: [name], MD;  Location: [facility] ORS;  Service: Gynecology;  Laterality: N/A;   BRAVO [place] STUDY N/A 10/21/2021   Procedure: BRAVO [place];  Surgeon: [name],  MD;  Location: [facility] ENDOSCOPY;  Service: Endoscopy;  Laterality: N/A;   CATARACT EXTRACTION, BILATERAL     CHOLECYSTECTOMY  10/15/2020   COLONOSCOPY  08/28/2011   Procedure: COLONOSCOPY;  Surgeon: [name], MD;  Location: [facility]O SUITE;  Service: Endoscopy;  Laterality: N/A;  930   ESOPHAGOGASTRODUODENOSCOPY N/A 10/21/2021   Procedure: ESOPHAGOGASTRODUODENOSCOPY (EGD);  Surgeon: [name], MD;  Location: [facility] ENDOSCOPY;  Service: Endoscopy;  Laterality: N/A;   EYE SURGERY Bilateral 02/13/2011   other 2013   MOHS SURGERY  06/2016   Removed skin cancer from Rt shin   TUBAL LIGATION     VAGINAL HYSTERECTOMY N/A 05/02/2013   Procedure: HYSTERECTOMY VAGINAL;  Surgeon: [name], MD;  Location: [facility] ORS;  Service: Gynecology;  Laterality: N/A;    Social History   Tobacco Use   Smoking status: Never   Smokeless tobacco: Never  Vaping Use   Vaping Use: Never used  Substance Use Topics   Alcohol use: Yes    Alcohol/week: 1.0 standard drink of alcohol    Types: 1 Standard drinks or equivalent per week   Drug use: Never     Medication list has been reviewed and updated.  Current Meds  Medication Sig   aspirin EC 81 MG tablet Take 81 mg by mouth at bedtime.    azithromycin (ZITHROMAX Z-PAK) 250 MG tablet UAD   Cholecalciferol 50 MCG (2000 UT) TABS Take 1 capsule by mouth in the morning.   estradiol (ESTRACE VAGINAL) 0.1 MG/GM vaginal cream Apply 0.63m (pea-sized amount)  just inside the vaginal introitus with a finger-tip on Monday, Wednesday and Friday nights.   hydrochlorothiazide (HYDRODIURIL) 25 MG tablet TAKE 1 TABLET BY MOUTH DAILY   metoprolol tartrate (LOPRESSOR) 25 MG tablet TAKE 1 AND 1/2 TABLETS BY MOUTH  TWICE DAILY   nitrofurantoin (MACRODANTIN) 100 MG capsule Take 100 mg by mouth daily.   pantoprazole (PROTONIX) 40 MG tablet TAKE 1 TABLET BY MOUTH AT  BEDTIME   ramipril (ALTACE) 10 MG capsule TAKE 1 CAPSULE BY MOUTH DAILY   simvastatin (ZOCOR) 10 MG tablet  TAKE 1 TABLET BY MOUTH DAILY       05/04/2022    9:40 AM 05/01/2022    8:52 AM 10/30/2021    9:44 AM 07/03/2021   10:14 AM  GAD 7 : Generalized Anxiety Score  Nervous, Anxious, on Edge 0 0 0 0  Control/stop worrying 0 0 0 0  Worry too much - different things 0 0 0 0  Trouble relaxing 0 0 0 0  Restless 0 0 0 0  Easily annoyed or irritable 0 0 0 0  Afraid - awful might happen 0 0 0 0  Total GAD 7 Score 0 0 0 0  Anxiety Difficulty Not difficult at all Not difficult at all Not difficult at all Not difficult at all       05/04/2022    9:40 AM 05/01/2022    8:52 AM 10/30/2021    9:44 AM  Depression screen PHQ 2/9  Decreased Interest 0 0 0  Down, Depressed, Hopeless 0 0 0  PHQ - 2 Score 0 0 0  Altered sleeping 0 0 0  Tired, decreased energy 0 0 0  Change in appetite 0 0 0  Feeling bad or failure about yourself  0 0 0  Trouble concentrating 0 0 0  Moving slowly or fidgety/restless 0 0 0  Suicidal thoughts 0 0 0  PHQ-9 Score 0 0 0  Difficult doing work/chores Not difficult at all Not difficult at all Not difficult at all    BP Readings from Last 3 Encounters:  05/04/22 (!) 154/78  05/01/22 128/76  02/26/22 (!) 157/75    Physical Exam Vitals and nursing note reviewed.  Constitutional:      General: She is not in acute distress.    Appearance: Normal appearance. She is well-developed.  HENT:     Head: Normocephalic and atraumatic.  Cardiovascular:     Rate and Rhythm: Normal rate and regular rhythm.  Pulmonary:     Effort: Pulmonary effort is normal. No respiratory distress.     Breath sounds: No wheezing or rhonchi.  Musculoskeletal:     Cervical back: Normal range of motion.     Right lower leg: No edema.     Left lower leg: No edema.  Skin:    General: Skin is warm and dry.     Findings: No rash.  Neurological:     Mental Status: She is alert and oriented to person, place, and time.  Psychiatric:        Mood and Affect: Mood normal.        Behavior: Behavior  normal.     Wt Readings from Last 3 Encounters:  05/04/22 128 lb (58.1 kg)  05/01/22 129 lb 3.2 oz (58.6 kg)  02/26/22 130 lb (59 kg)    BP (!) 154/78 (BP Location: Left Arm)   Pulse 89   Ht _0  (1.549 m)   Wt 128 lb (58.1 kg)   SpO2 96%   BMI 24.19 kg/m   Assessment and Plan: 1. Essential hypertension, benign BP slightly elevated today but current treated for sinus infection Will continue current regimen - CBC with Differential/Platelet - metoprolol tartrate (LOPRESSOR) 25 MG tablet; TAKE 1 AND 1/2 TABLETS BY MOUTH  TWICE DAILY  Dispense: 270 tablet; Refill: 1 - hydrochlorothiazide (HYDRODIURIL) 25 MG tablet; Take 1 tablet (25 mg total) by mouth daily.  Dispense: 90 tablet; Refill: 1 - ramipril (ALTACE) 10 MG capsule; Take 1 capsule (10 mg total) by mouth daily.  Dispense: 90 capsule; Refill: 1  2. Hepatic steatosis Check labs - if not improving, will refer back to GI - Comprehensive metabolic panel  3. Acute non-recurrent maxillary sinusitis Elizebeth Koller Zpak She is very concerned that there may be mold in her house - I encouraged her to have the air quality in her home tested to set her mind at ease.   Partially dictated using Editor, commissioning. Any errors are unintentional.  Halina Maidens, MD [facility]  05/04/2022

## 2022-05-05 LAB — COMPREHENSIVE METABOLIC PANEL
ALT: 43 IU/L — ABNORMAL HIGH (ref 0–32)
AST: 40 IU/L (ref 0–40)
Albumin/Globulin Ratio: 1.1 — ABNORMAL LOW (ref 1.2–2.2)
Albumin: 4.2 g/dL (ref 3.8–4.8)
Alkaline Phosphatase: 75 IU/L (ref 44–121)
BUN/Creatinine Ratio: 19 (ref 12–28)
BUN: 16 mg/dL (ref 8–27)
Bilirubin Total: 1 mg/dL (ref 0.0–1.2)
CO2: 21 mmol/L (ref 20–29)
Calcium: 9.9 mg/dL (ref 8.7–10.3)
Chloride: 101 mmol/L (ref 96–106)
Creatinine, Ser: 0.85 mg/dL (ref 0.57–1.00)
Globulin, Total: 4 g/dL (ref 1.5–4.5)
Glucose: 118 mg/dL — ABNORMAL HIGH (ref 70–99)
Potassium: 4 mmol/L (ref 3.5–5.2)
Sodium: 139 mmol/L (ref 134–144)
Total Protein: 8.2 g/dL (ref 6.0–8.5)
eGFR: 70 mL/min/{1.73_m2} (ref >59)

## 2022-05-05 LAB — CBC WITH DIFFERENTIAL/PLATELET
Basophils Absolute: 0 10*3/uL (ref 0.0–0.2)
Basos: 0 %
EOS (ABSOLUTE): 0.1 10*3/uL (ref 0.0–0.4)
Eos: 1 %
Hematocrit: 40.8 % (ref 34.0–46.6)
Hemoglobin: 14.3 g/dL (ref 11.1–15.9)
Immature Grans (Abs): 0 10*3/uL (ref 0.0–0.1)
Immature Granulocytes: 0 %
Lymphocytes Absolute: 0.8 10*3/uL (ref 0.7–3.1)
Lymphs: 12 %
MCH: 31.4 pg (ref 26.6–33.0)
MCHC: 35 g/dL (ref 31.5–35.7)
MCV: 90 fL (ref 79–97)
Monocytes Absolute: 0.4 10*3/uL (ref 0.1–0.9)
Monocytes: 6 %
Neutrophils Absolute: 5.5 10*3/uL (ref 1.4–7.0)
Neutrophils: 81 %
Platelets: 240 10*3/uL (ref 150–450)
RBC: 4.55 x10E6/uL (ref 3.77–5.28)
RDW: 12.4 % (ref 11.7–15.4)
WBC: 6.9 10*3/uL (ref 3.4–10.8)

## 2022-05-11 ENCOUNTER — Ambulatory Visit (INDEPENDENT_AMBULATORY_CARE_PROVIDER_SITE_OTHER): Payer: Medicare Other | Admitting: Urology

## 2022-05-11 VITALS — BP 175/81 | HR 77 | Ht 61.0 in | Wt 129.0 lb

## 2022-05-11 DIAGNOSIS — N8110 Cystocele, unspecified: Secondary | ICD-10-CM | POA: Diagnosis not present

## 2022-05-11 DIAGNOSIS — Z87440 Personal history of urinary (tract) infections: Secondary | ICD-10-CM

## 2022-05-11 DIAGNOSIS — N3020 Other chronic cystitis without hematuria: Secondary | ICD-10-CM

## 2022-05-11 MED ORDER — NITROFURANTOIN MACROCRYSTAL 100 MG PO CAPS: 100.0000 mg | ORAL_CAPSULE | Freq: Every day | ORAL | 3 refills | Status: DC

## 2022-05-11 NOTE — Progress Notes (Signed)
05/11/2022 10:10 AM   Rebecca Gutierrez Jul 29, 1940 [id number]  Referring provider: [name], MD [address] [place],  [place] [address] [address]  Chief Complaint  Patient presents with   Follow-up   Cystitis    HPI: 2019 I saw patient in 2019.   She can feel some vaginal bulging but she was actually quite nonspecific about it and has been chronic.  Used to be managed with pessary    Patient has had 5 or 6 bladder infections with typical cystitis symptoms that generally respond to antibiotics.  She does not really have prolapse symptoms otherwise.  She is had a hysterectomy.  She had an ultrasound that demonstrated a residual of 236 mL. Kidneys are normal   1 pelvic examination patient had moderate grade 3 cystocele with central defect. Vaginal cuff descended from 8 or 9 cm to approximately 5 cm. No rectocele. No stress incontinence. Cystocele was just reached the introitus   Residual today 175 mL   Picture drawn. Patient has a relatively asymptomatic cystocele. It could be related to the elevated residual. She understands if she had a transvaginal vault suspension with cystocele repair and graft her residual may improve with a natural history of her bladder infections is likely not going to change. She was getting breakthrough infections on trimethoprim. I suggest to see me back on daily Macrodantin in 2 months and check another residual. It is a lot of surgery to go through hoping to reduce bladder infection frequency. Role of urodynamics not discussed   Patient infection free on Macrodantin but for some reason she thought she should take it 4 times a day   I spent a lot of time clarifying to take it once a day and prescription was renewed 90x3 and see in 4 month   Today Frequency stable.  Patient is very happy on daily Macrodantin.  Prolapse table.  Clinically not infected.   PMH: Past Medical History:  Diagnosis Date   Abnormal ECG 10/28/2017   Anxiety     Bradycardia 11/10/2017   Cancer ([place])    skin ca   Cystocele 09/13/2013   small recurrent, less than before 09/13/13 JVF    Gilbert's syndrome    Hearing loss 1975   History of recurrent urinary tract infection 03/24/2016   Hyperlipidemia 2000   Hypertension 2000   Recurrent UTI 03/10/2015    Surgical History: Past Surgical History:  Procedure Laterality Date   ANTERIOR AND POSTERIOR REPAIR N/A 05/02/2013   Procedure: ANTERIOR (CYSTOCELE) AND POSTERIOR REPAIR (RECTOCELE);  Surgeon: [name], MD;  Location: [facility];  Service: Gynecology;  Laterality: N/A;   BRAVO [place] STUDY N/A 10/21/2021   Procedure: BRAVO [place];  Surgeon: [name], MD;  Location: [facility] ENDOSCOPY;  Service: Endoscopy;  Laterality: N/A;   CATARACT EXTRACTION, BILATERAL     CHOLECYSTECTOMY  10/15/2020   COLONOSCOPY  08/28/2011   Procedure: COLONOSCOPY;  Surgeon: [name], MD;  Location: [facility] ENDO SUITE;  Service: Endoscopy;  Laterality: N/A;  930   ESOPHAGOGASTRODUODENOSCOPY N/A 10/21/2021   Procedure: ESOPHAGOGASTRODUODENOSCOPY (EGD);  Surgeon: [name], MD;  Location: [facility] ENDOSCOPY;  Service: Endoscopy;  Laterality: N/A;   EYE SURGERY Bilateral 02/13/2011   other 2013   MOHS SURGERY  06/2016   Removed skin cancer from Rt shin   TUBAL LIGATION     VAGINAL HYSTERECTOMY N/A 05/02/2013   Procedure: HYSTERECTOMY VAGINAL;  Surgeon: [name], MD;  Location: [facility]S;  Service: Gynecology;  Laterality: N/A;    Home Medications:  Allergies as of 05/11/2022       Reactions   Ciprofloxacin Other (See Comments)   Tendinitis    Levaquin [levofloxacin] Other (See Comments)   insomnia   [name] Diarrhea   [name] Palpitations, Rash   Last reaction was in 1969        Medication List        Accurate as of May 11, 2022 10:10 AM. If you have any questions, ask your nurse or doctor.          aspirin EC 81 MG tablet Take 81 mg by mouth at bedtime.    Cholecalciferol 50 MCG (2000 UT) Tabs Take 1 capsule by mouth in the morning.   estradiol 0.1 MG/GM vaginal cream Commonly known as: ESTRACE VAGINAL Apply 0.'5mg'$  (pea-sized amount)  just inside the vaginal introitus with a finger-tip on Monday, Wednesday and Friday nights.   hydrochlorothiazide 25 MG tablet Commonly known as: HYDRODIURIL Take 1 tablet (25 mg total) by mouth daily.   metoprolol tartrate 25 MG tablet Commonly known as: LOPRESSOR TAKE 1 AND 1/2 TABLETS BY MOUTH  TWICE DAILY   nitrofurantoin 100 MG capsule Commonly known as: MACRODANTIN Take 100 mg by mouth daily.   pantoprazole 40 MG tablet Commonly known as: PROTONIX TAKE 1 TABLET BY MOUTH AT  BEDTIME   ramipril 10 MG capsule Commonly known as: ALTACE Take 1 capsule (10 mg total) by mouth daily.   simvastatin 10 MG tablet Commonly known as: ZOCOR TAKE 1 TABLET BY MOUTH DAILY        Allergies:  Allergies  Allergen Reactions   Ciprofloxacin Other (See Comments)    Tendinitis    Levaquin [Levofloxacin] Other (See Comments)    insomnia   [name] Diarrhea   [name] Palpitations and Rash    Last reaction was in 1969    Family History: Family History  Problem Relation Age of Onset   Hypertension Mother    Cancer Mother        stomach   Healthy Father    Healthy Brother    Colon cancer Neg Hx    Prostate cancer Neg Hx    Kidney cancer Neg Hx    Bladder Cancer Neg Hx    Breast cancer Neg Hx     Social History:  reports that she has never smoked. She has never used smokeless tobacco. She reports current alcohol use of about 1.0 standard drink of alcohol per week. She reports that she does not use drugs.  ROS:                                        Physical Exam: There were no vitals taken for this visit.  Constitutional:  Alert and oriented, No acute distress. HEENT: [place] AT, moist mucus membranes.  Trachea midline, no  masses.   Laboratory Data: Lab Results  Component Value Date   WBC 6.9 05/04/2022   HGB 14.3 05/04/2022   HCT 40.8 05/04/2022   MCV 90 05/04/2022   PLT 240 05/04/2022    Lab Results  Component Value Date   CREATININE 0.85 05/04/2022    No results found for: "PSA"  No results found for: "TESTOSTERONE"  Lab Results  Component Value Date   HGBA1C 5.6 10/30/2021    Urinalysis    Component Value Date/Time   COLORURINE YELLOW 08/07/2019 1128  APPEARANCEUR Clear 10/30/2019 1055   LABSPEC 1.010 08/07/2019 1128   PHURINE 6.5 08/07/2019 1128   GLUCOSEU Negative 10/30/2019 1055   HGBUR TRACE (A) 08/07/2019 1128   BILIRUBINUR Negative 10/30/2019 1055   KETONESUR NEGATIVE 08/07/2019 1128   PROTEINUR Negative 10/30/2019 1055   PROTEINUR NEGATIVE 08/07/2019 1128   UROBILINOGEN 0.2 01/13/2018 1147   UROBILINOGEN 0.2 05/02/2013 0930   NITRITE Negative 10/30/2019 1055   NITRITE NEGATIVE 08/07/2019 1128   LEUKOCYTESUR Negative 10/30/2019 1055   LEUKOCYTESUR MODERATE (A) 08/07/2019 1128    Pertinent Imaging:   Assessment & Plan: Macrodantin 100 mg 90 x 3 sent to pharmacy and I will see in 1 year  There are no diagnoses linked to this encounter.  No follow-ups on file.  Reece Packer, MD  [facility] [address] [place], [place] [address] [phone]

## 2022-05-20 ENCOUNTER — Ambulatory Visit: Payer: Medicare Other | Admitting: Gastroenterology

## 2022-07-30 ENCOUNTER — Ambulatory Visit (INDEPENDENT_AMBULATORY_CARE_PROVIDER_SITE_OTHER): Payer: Medicare Other | Admitting: Internal Medicine

## 2022-07-30 ENCOUNTER — Encounter: Payer: Self-pay | Admitting: Internal Medicine

## 2022-07-30 ENCOUNTER — Ambulatory Visit: Payer: Self-pay

## 2022-07-30 VITALS — BP 136/78 | HR 74 | Temp 98.5°F | Ht 61.0 in | Wt 131.0 lb

## 2022-07-30 DIAGNOSIS — J069 Acute upper respiratory infection, unspecified: Secondary | ICD-10-CM

## 2022-07-30 MED ORDER — DOXYCYCLINE HYCLATE 100 MG PO TABS: 100.0000 mg | ORAL_TABLET | Freq: Two times a day (BID) | ORAL | 0 refills | Status: AC

## 2022-07-30 MED ORDER — PROMETHAZINE-DM 6.25-15 MG/5ML PO SYRP: 5.0000 mL | ORAL_SOLUTION | Freq: Four times a day (QID) | ORAL | 0 refills | Status: AC | PRN

## 2022-07-30 NOTE — Progress Notes (Signed)
Date:  07/30/2022   Name:  Rebecca Gutierrez   DOB:  1940/07/22   MRN:  [id number]   Chief Complaint: Cough  Cough This is a new problem. Episode onset: X6 days. The problem has been gradually worsening. The problem occurs every few minutes. The cough is Productive of sputum (green when blowing nose). Associated symptoms include ear congestion, ear pain, nasal congestion, postnasal drip and rhinorrhea. Pertinent negatives include no chest pain, chills, fever, shortness of breath or wheezing. She has tried nothing for the symptoms.    Lab Results  Component Value Date   NA 139 05/04/2022   K 4.0 05/04/2022   CO2 21 05/04/2022   GLUCOSE 118 (H) 05/04/2022   BUN 16 05/04/2022   CREATININE 0.85 05/04/2022   CALCIUM 9.9 05/04/2022   EGFR 70 05/04/2022   GFRNONAA 78 07/22/2020   Lab Results  Component Value Date   CHOL 171 10/30/2021   HDL 59 10/30/2021   LDLCALC 93 10/30/2021   TRIG 108 10/30/2021   CHOLHDL 2.9 10/30/2021   Lab Results  Component Value Date   TSH 2.070 10/30/2021   Lab Results  Component Value Date   HGBA1C 5.6 10/30/2021   Lab Results  Component Value Date   WBC 6.9 05/04/2022   HGB 14.3 05/04/2022   HCT 40.8 05/04/2022   MCV 90 05/04/2022   PLT 240 05/04/2022   Lab Results  Component Value Date   ALT 43 (H) 05/04/2022   AST 40 05/04/2022   ALKPHOS 75 05/04/2022   BILITOT 1.0 05/04/2022   Lab Results  Component Value Date   VD25OH 32.2 04/01/2016     Review of Systems  Constitutional:  Negative for chills, fatigue and fever.  HENT:  Positive for ear pain, postnasal drip and rhinorrhea. Negative for trouble swallowing.   Respiratory:  Positive for cough. Negative for chest tightness, shortness of breath and wheezing.   Cardiovascular:  Negative for chest pain.  Psychiatric/Behavioral:  Negative for dysphoric mood and sleep disturbance. The patient is not nervous/anxious.     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   Hearing loss 06/02/2007   Essential hypertension, benign 06/02/2007    Allergies  Allergen Reactions   Ciprofloxacin Other (See Comments)    Tendinitis    Levaquin [Levofloxacin] Other (See Comments)    insomnia   [name] Diarrhea   [name] Palpitations and Rash    Last reaction was in 1969    Past Surgical History:  Procedure Laterality Date   ANTERIOR AND POSTERIOR REPAIR N/A 05/02/2013   Procedure: ANTERIOR (CYSTOCELE) AND POSTERIOR REPAIR (RECTOCELE);  Surgeon: [name], MD;  Location: [facility];  Service: Gynecology;  Laterality: N/A;   BRAVO [place] STUDY N/A 10/21/2021   Procedure: BRAVO [place];  Surgeon: [name], MD;  Location: [facility] ENDOSCOPY;  Service: Endoscopy;  Laterality: N/A;   CATARACT EXTRACTION, BILATERAL     CHOLECYSTECTOMY  10/15/2020   COLONOSCOPY  08/28/2011   Procedure: COLONOSCOPY;  Surgeon: [name], MD;  Location: [facility] ENDO SUITE;  Service: Endoscopy;  Laterality: N/A;  930   ESOPHAGOGASTRODUODENOSCOPY N/A 10/21/2021  Procedure: ESOPHAGOGASTRODUODENOSCOPY (EGD);  Surgeon: [name], MD;  Location: [facility] ENDOSCOPY;  Service: Endoscopy;  Laterality: N/A;   EYE SURGERY Bilateral 02/13/2011   other 2013   MOHS SURGERY  06/2016   Removed skin cancer from Rt shin   TUBAL LIGATION     VAGINAL HYSTERECTOMY N/A 05/02/2013   Procedure: HYSTERECTOMY VAGINAL;  Surgeon: [name], MD;  Location: [facility]S;  Service: Gynecology;  Laterality: N/A;    Social History   Tobacco Use   Smoking status: Never   Smokeless tobacco: Never  Vaping Use   Vaping Use:  Never used  Substance Use Topics   Alcohol use: Yes    Alcohol/week: 1.0 standard drink of alcohol    Types: 1 Standard drinks or equivalent per week   Drug use: Never     Medication list has been reviewed and updated.  Current Meds  Medication Sig   aspirin EC 81 MG tablet Take 81 mg by mouth at bedtime.    Cholecalciferol 50 MCG (2000 UT) TABS Take 1 capsule by mouth in the morning.   doxycycline (VIBRA-TABS) 100 MG tablet Take 1 tablet (100 mg total) by mouth 2 (two) times daily for 10 days.   estradiol (ESTRACE VAGINAL) 0.1 MG/GM vaginal cream Apply 0.38m (pea-sized amount)  just inside the vaginal introitus with a finger-tip on Monday, Wednesday and Friday nights.   hydrochlorothiazide (HYDRODIURIL) 25 MG tablet Take 1 tablet (25 mg total) by mouth daily.   metoprolol tartrate (LOPRESSOR) 25 MG tablet TAKE 1 AND 1/2 TABLETS BY MOUTH  TWICE DAILY   pantoprazole (PROTONIX) 40 MG tablet TAKE 1 TABLET BY MOUTH AT  BEDTIME   promethazine-dextromethorphan (PROMETHAZINE-DM) 6.25-15 MG/5ML syrup Take 5 mLs by mouth 4 (four) times daily as needed for up to 9 days for cough.   ramipril (ALTACE) 10 MG capsule Take 1 capsule (10 mg total) by mouth daily.   simvastatin (ZOCOR) 10 MG tablet TAKE 1 TABLET BY MOUTH DAILY       07/30/2022   10:26 AM 05/04/2022    9:40 AM 05/01/2022    8:52 AM 10/30/2021    9:44 AM  GAD 7 : Generalized Anxiety Score  Nervous, Anxious, on Edge 0 0 0 0  Control/stop worrying 0 0 0 0  Worry too much - different things 0 0 0 0  Trouble relaxing 0 0 0 0  Restless 0 0 0 0  Easily annoyed or irritable 0 0 0 0  Afraid - awful might happen 0 0 0 0  Total GAD 7 Score 0 0 0 0  Anxiety Difficulty Not difficult at all Not difficult at all Not difficult at all Not difficult at all       07/30/2022   10:26 AM 05/04/2022    9:40 AM 05/01/2022    8:52 AM  Depression screen PHQ 2/9  Decreased Interest 0 0 0  Down, Depressed, Hopeless 0 0 0  PHQ - 2 Score 0 0 0   Altered sleeping 0 0 0  Tired, decreased energy 0 0 0  Change in appetite 0 0 0  Feeling bad or failure about yourself  0 0 0  Trouble concentrating 0 0 0  Moving slowly or fidgety/restless 0 0 0  Suicidal thoughts 0 0 0  PHQ-9 Score 0 0 0  Difficult doing work/chores Not difficult at all Not difficult at all Not difficult at all    BP Readings from Last 3 Encounters:  07/30/22 136/78  05/11/22 (Marland Kitchen  175/81  05/04/22 (!) 154/78    Physical Exam Constitutional:      Appearance: She is well-developed.  HENT:     Right Ear: Tympanic membrane is not erythematous or retracted.     Left Ear: Tympanic membrane is not erythematous or retracted.     Nose:     Right Sinus: Maxillary sinus tenderness and frontal sinus tenderness present.     Left Sinus: Maxillary sinus tenderness and frontal sinus tenderness present.     Mouth/Throat:     Mouth: No oral lesions.     Pharynx: Uvula midline.  Cardiovascular:     Rate and Rhythm: Normal rate and regular rhythm.     Heart sounds: Normal heart sounds.  Pulmonary:     Breath sounds: Transmitted upper airway sounds present. Examination of the right-upper field reveals rhonchi. Rhonchi present. No wheezing or rales.  Musculoskeletal:     Cervical back: Normal range of motion.  Lymphadenopathy:     Cervical: No cervical adenopathy.  Neurological:     Mental Status: She is alert and oriented to person, place, and time.     Wt Readings from Last 3 Encounters:  07/30/22 131 lb (59.4 kg)  05/11/22 129 lb (58.5 kg)  05/04/22 128 lb (58.1 kg)    BP 136/78 (BP Location: Right Arm, Cuff Size: Normal)   Pulse 74   Temp 98.5 F (36.9 C) (Oral)   Ht 5' 1"$  (1.549 m)   Wt 131 lb (59.4 kg)   SpO2 96%   BMI 24.75 kg/m   Assessment and Plan: Problem List Items Addressed This Visit   None Visit Diagnoses     Upper respiratory tract infection, unspecified type    -  Primary   Use Mucinex-DM Fluids, rest and follow up if no improvement    Relevant Medications   doxycycline (VIBRA-TABS) 100 MG tablet   promethazine-dextromethorphan (PROMETHAZINE-DM) 6.25-15 MG/5ML syrup        Partially dictated using Editor, commissioning. Any errors are unintentional.  Halina Maidens, MD [facility]  07/30/2022

## 2022-07-30 NOTE — Telephone Encounter (Signed)
   Chief Complaint: Cough, sinus drainage -green Symptoms: Above Frequency: 1 week ago Pertinent Negatives: Patient denies fever Disposition: '[]'$ ED /'[]'$ Urgent Care (no appt availability in office) / '[x]'$ Appointment(In office/virtual)/ '[]'$  [place] [facility]/ '[]'$ Home Care/ '[]'$ Refused Recommended Disposition /'[]'$ [place] [facility]/ '[]'$  Follow-up with PCP Additional Notes:    Reason for Disposition  [1] Continuous (nonstop) coughing interferes with work or school AND [2] no improvement using cough treatment per Care Advice  Answer Assessment - Initial Assessment Questions 1. ONSET: "When did the cough begin?"      1 week ago 2. SEVERITY: "How bad is the cough today?"      Moderate 3. SPUTUM: "Describe the color of your sputum" (none, dry cough; clear, white, yellow, green)     Green from nose 4. HEMOPTYSIS: "Are you coughing up any blood?" If so ask: "How much?" (flecks, streaks, tablespoons, etc.)     No 5. DIFFICULTY BREATHING: "Are you having difficulty breathing?" If Yes, ask: "How bad is it?" (e.g., mild, moderate, severe)    - MILD: No SOB at rest, mild SOB with walking, speaks normally in sentences, can lie down, no retractions, pulse < 100.    - MODERATE: SOB at rest, SOB with minimal exertion and prefers to sit, cannot lie down flat, speaks in phrases, mild retractions, audible wheezing, pulse 100-120.    - SEVERE: Very SOB at rest, speaks in single words, struggling to breathe, sitting hunched forward, retractions, pulse > 120      Mild 6. FEVER: "Do you have a fever?" If Yes, ask: "What is your temperature, how was it measured, and when did it start?"     No 7. CARDIAC HISTORY: "Do you have any history of heart disease?" (e.g., heart attack, congestive heart failure)      No 8. LUNG HISTORY: "Do you have any history of lung disease?"  (e.g., pulmonary embolus, asthma, emphysema)     No 9. PE RISK FACTORS: "Do you have a history of blood clots?" (or: recent major surgery,  recent prolonged travel, bedridden)     No 10. OTHER SYMPTOMS: "Do you have any other symptoms?" (e.g., runny nose, wheezing, chest pain)       Runny nose 11. PREGNANCY: "Is there any chance you are pregnant?" "When was your last menstrual period?"       No 12. TRAVEL: "Have you traveled out of the country in the last month?" (e.g., travel history, exposures)       No  Protocols used: Cough - Acute Non-Productive-A-AH

## 2022-07-30 NOTE — Patient Instructions (Signed)
Use Mucinex-DM to help with congestion and cough.  Drink plenty of fluids.

## 2022-10-05 ENCOUNTER — Ambulatory Visit (INDEPENDENT_AMBULATORY_CARE_PROVIDER_SITE_OTHER): Payer: Medicare Other | Admitting: Internal Medicine

## 2022-10-05 ENCOUNTER — Encounter: Payer: Self-pay | Admitting: Internal Medicine

## 2022-10-05 VITALS — BP 138/70 | HR 84 | Temp 98.2°F | Ht 61.0 in | Wt 132.0 lb

## 2022-10-05 DIAGNOSIS — J069 Acute upper respiratory infection, unspecified: Secondary | ICD-10-CM | POA: Diagnosis not present

## 2022-10-05 MED ORDER — AMOXICILLIN-POT CLAVULANATE 875-125 MG PO TABS: 1.0000 | ORAL_TABLET | Freq: Two times a day (BID) | ORAL | 0 refills | Status: AC

## 2022-10-05 MED ORDER — PROMETHAZINE-DM 6.25-15 MG/5ML PO SYRP
5.0000 mL | ORAL_SOLUTION | Freq: Four times a day (QID) | ORAL | 0 refills | Status: AC | PRN
Start: 2022-10-05 — End: 2022-10-14

## 2022-10-05 NOTE — Progress Notes (Signed)
Date:  10/05/2022   Name:  Rebecca Gutierrez   DOB:  Oct 05, 1940   MRN:  [id number]   Chief Complaint: Cough  Cough This is a new problem. The current episode started in the past 7 days. The problem has been waxing and waning. The cough is Productive of sputum. Pertinent negatives include no chest pain, chills, ear pain, fever, headaches, myalgias, rhinorrhea, sore throat, shortness of breath or wheezing. She has tried OTC cough suppressant and prescription cough suppressant for the symptoms.    Lab Results  Component Value Date   NA 139 05/04/2022   K 4.0 05/04/2022   CO2 21 05/04/2022   GLUCOSE 118 (H) 05/04/2022   BUN 16 05/04/2022   CREATININE 0.85 05/04/2022   CALCIUM 9.9 05/04/2022   EGFR 70 05/04/2022   GFRNONAA 78 07/22/2020   Lab Results  Component Value Date   CHOL 171 10/30/2021   HDL 59 10/30/2021   LDLCALC 93 10/30/2021   TRIG 108 10/30/2021   CHOLHDL 2.9 10/30/2021   Lab Results  Component Value Date   TSH 2.070 10/30/2021   Lab Results  Component Value Date   HGBA1C 5.6 10/30/2021   Lab Results  Component Value Date   WBC 6.9 05/04/2022   HGB 14.3 05/04/2022   HCT 40.8 05/04/2022   MCV 90 05/04/2022   PLT 240 05/04/2022   Lab Results  Component Value Date   ALT 43 (H) 05/04/2022   AST 40 05/04/2022   ALKPHOS 75 05/04/2022   BILITOT 1.0 05/04/2022   Lab Results  Component Value Date   VD25OH 32.2 04/01/2016     Review of Systems  Constitutional:  Negative for chills, diaphoresis, fatigue and fever.  HENT:  Negative for ear pain, rhinorrhea, sore throat and trouble swallowing.   Respiratory:  Positive for cough. Negative for chest tightness, shortness of breath and wheezing.   Cardiovascular:  Negative for chest pain.  Gastrointestinal:  Negative for diarrhea and nausea.  Musculoskeletal:  Negative for myalgias.  Neurological:  Negative for dizziness, light-headedness and headaches.  Psychiatric/Behavioral:  Negative for dysphoric  mood and sleep disturbance. The patient is not nervous/anxious.     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   Hearing loss 06/02/2007   Essential hypertension, benign 06/02/2007    Allergies  Allergen Reactions   Ciprofloxacin Other (See Comments)    Tendinitis    Levaquin [Levofloxacin] Other (See Comments)    insomnia   [name] Diarrhea   [name] Palpitations and Rash    Last reaction was in 1969    Past Surgical History:  Procedure Laterality Date   ANTERIOR AND POSTERIOR REPAIR N/A 05/02/2013   Procedure: ANTERIOR (CYSTOCELE) AND POSTERIOR REPAIR (RECTOCELE);  Surgeon: [name], MD;  Location: [facility] ORS;  Service: Gynecology;  Laterality: N/A;   BRAVO PH STUDY N/A 10/21/2021   Procedure: BRAVO PH STUDY;  Surgeon: Midge Minium, MD;  Location: [facility] ENDOSCOPY;  Service: Endoscopy;  Laterality: N/A;   CATARACT EXTRACTION, BILATERAL     CHOLECYSTECTOMY  10/15/2020   COLONOSCOPY  08/28/2011   Procedure: COLONOSCOPY;  Surgeon: Malissa Hippo, MD;  Location: [facility]  ENDO SUITE;  Service: Endoscopy;  Laterality: N/A;  930   ESOPHAGOGASTRODUODENOSCOPY N/A 10/21/2021   Procedure: ESOPHAGOGASTRODUODENOSCOPY (EGD);  Surgeon: Midge Minium, MD;  Location: [facility] ENDOSCOPY;  Service: Endoscopy;  Laterality: N/A;   EYE SURGERY Bilateral 02/13/2011   other 2013   MOHS SURGERY  06/2016   Removed skin cancer from Rt shin   TUBAL LIGATION     VAGINAL HYSTERECTOMY N/A 05/02/2013   Procedure: HYSTERECTOMY VAGINAL;  Surgeon: [name], MD;  Location: [facility] ORS;  Service: Gynecology;  Laterality: N/A;     Social History   Tobacco Use   Smoking status: Never   Smokeless tobacco: Never  Vaping Use   Vaping Use: Never used  Substance Use Topics   Alcohol use: Yes    Alcohol/week: 1.0 standard drink of alcohol    Types: 1 Standard drinks or equivalent per week   Drug use: Never     Medication list has been reviewed and updated.  Current Meds  Medication Sig   amoxicillin-clavulanate (AUGMENTIN) 875-125 MG tablet Take 1 tablet by mouth 2 (two) times daily for 10 days.   aspirin EC 81 MG tablet Take 81 mg by mouth at bedtime.    Cholecalciferol 50 MCG (2000 UT) TABS Take 1 capsule by mouth in the morning.   estradiol (ESTRACE VAGINAL) 0.1 MG/GM vaginal cream Apply 0.5mg  (pea-sized amount)  just inside the vaginal introitus with a finger-tip on Monday, Wednesday and Friday nights.   hydrochlorothiazide (HYDRODIURIL) 25 MG tablet Take 1 tablet (25 mg total) by mouth daily.   metoprolol tartrate (LOPRESSOR) 25 MG tablet TAKE 1 AND 1/2 TABLETS BY MOUTH  TWICE DAILY   pantoprazole (PROTONIX) 40 MG tablet TAKE 1 TABLET BY MOUTH AT  BEDTIME   promethazine-dextromethorphan (PROMETHAZINE-DM) 6.25-15 MG/5ML syrup Take 5 mLs by mouth 4 (four) times daily as needed for up to 9 days for cough.   ramipril (ALTACE) 10 MG capsule Take 1 capsule (10 mg total) by mouth daily.   simvastatin (ZOCOR) 10 MG tablet TAKE 1 TABLET BY MOUTH DAILY       07/30/2022   10:26 AM 05/04/2022    9:40 AM 05/01/2022    8:52 AM 10/30/2021    9:44 AM  GAD 7 : Generalized Anxiety Score  Nervous, Anxious, on Edge 0 0 0 0  Control/stop worrying 0 0 0 0  Worry too much - different things 0 0 0 0  Trouble relaxing 0 0 0 0  Restless 0 0 0 0  Easily annoyed or irritable 0 0 0 0  Afraid - awful might happen 0 0 0 0  Total GAD 7 Score 0 0 0 0  Anxiety Difficulty Not difficult at all Not difficult at all Not difficult at all Not difficult at all       07/30/2022   10:26 AM 05/04/2022    9:40 AM 05/01/2022    8:52  AM  Depression screen PHQ 2/9  Decreased Interest 0 0 0  Down, Depressed, Hopeless 0 0 0  PHQ - 2 Score 0 0 0  Altered sleeping 0 0 0  Tired, decreased energy 0 0 0  Change in appetite 0 0 0  Feeling bad or failure about yourself  0 0 0  Trouble concentrating 0 0 0  Moving slowly or fidgety/restless 0 0 0  Suicidal thoughts 0 0 0  PHQ-9 Score 0 0 0  Difficult doing work/chores Not difficult at all Not difficult at all Not difficult at all  BP Readings from Last 3 Encounters:  10/05/22 138/70  07/30/22 136/78  05/11/22 (!) 175/81    Physical Exam Vitals and nursing note reviewed.  Constitutional:      General: She is not in acute distress.    Appearance: Normal appearance. She is well-developed.  HENT:     Head: Normocephalic and atraumatic.  Cardiovascular:     Rate and Rhythm: Normal rate and regular rhythm.  Pulmonary:     Effort: Pulmonary effort is normal. No respiratory distress.     Breath sounds: No wheezing or rhonchi.  Musculoskeletal:     Cervical back: Normal range of motion.  Lymphadenopathy:     Cervical: No cervical adenopathy.  Skin:    General: Skin is warm and dry.     Findings: No rash.  Neurological:     Mental Status: She is alert and oriented to person, place, and time.  Psychiatric:        Mood and Affect: Mood normal.        Behavior: Behavior normal.     Wt Readings from Last 3 Encounters:  10/05/22 132 lb (59.9 kg)  07/30/22 131 lb (59.4 kg)  05/11/22 129 lb (58.5 kg)    BP 138/70 (BP Location: Left Arm, Cuff Size: Normal)   Pulse 84   Temp 98.2 F (36.8 C) (Oral)   Ht  (1.549 m)   Wt 132 lb (59.9 kg)   SpO2 96%   BMI 24.94 kg/m   Assessment and Plan:  Problem List Items Addressed This Visit   None Visit Diagnoses     Upper respiratory tract infection, unspecified type    -  Primary   Likely viral - continue Mucinex has a propensity to transition to bacterial bronchitis Use cough syrup at bedtime Augmentin if  symptoms worsen   Relevant Medications   amoxicillin-clavulanate (AUGMENTIN) 875-125 MG tablet   promethazine-dextromethorphan (PROMETHAZINE-DM) 6.25-15 MG/5ML syrup       No follow-ups on file.   Partially dictated using Dragon software, any errors are not intentional.  Reubin Milan, MD [facility] [place], [place]

## 2022-10-08 ENCOUNTER — Telehealth: Payer: Self-pay | Admitting: Internal Medicine

## 2022-10-08 NOTE — Telephone Encounter (Signed)
Contacted Syrena A Auman to schedule their annual wellness visit. Appointment made for 11/04/2022.  Verlee Rossetti; Care Guide Ambulatory Clinical Support [place] [facility] Direct Dial: [phone]

## 2022-10-08 NOTE — Telephone Encounter (Signed)
Patient: Rebecca Gutierrez Phone: [phone] Provider Bari Edward MD   Spoke with patient to schedule her AWV.  She stated that she was still coughing and the medication was giving her diarrhea.  She also stated that she is taken amoxicillin 2xd. She would like a call from provider.  Please advise.

## 2022-10-09 ENCOUNTER — Ambulatory Visit: Payer: Self-pay | Admitting: *Deleted

## 2022-10-09 NOTE — Telephone Encounter (Signed)
Called and left VM informing pt if she wants a different antibiotic called in then call back. Otherwise, the cough can take several weeks to taper off and then resolve so she needs to give it more time.  - Earon Rivest

## 2022-10-09 NOTE — Telephone Encounter (Signed)
Patient informed.  Rebecca Gutierrez 

## 2022-10-09 NOTE — Telephone Encounter (Signed)
  Chief Complaint: OV Monday- antibiotic started 2 days ago Symptoms: cough,chest congestion- green, causes SOB with exertion, coughing spells Frequency: appointment Monday Pertinent Negatives: Patient denies fever, nasal symptoms  Disposition: [] ED /[] Urgent Care (no appt availability in office) / [] Appointment(In office/virtual)/ []  [place] [facility]/ [] Home Care/ [] Refused Recommended Disposition /[] [place] [facility]/ [x]  Follow-up with PCP Additional Notes: Patient has only been taking antibiotic for 2 days and her diarrhea was before she started the antibiotic. Patient is having problems with her cough and she is not treating that now. Patient had stopped Mucinex and cough medication when she started the antibiotic. Patient would like provider to advise her on what to take for cough since that seems to be causing her fatiue

## 2022-10-09 NOTE — Telephone Encounter (Signed)
Reason for Disposition  [1] Taking antibiotic > 72 hours (3 days) AND [2] sinus pain not improved    No sinus pain- more cough and chest congestion  Answer Assessment - Initial Assessment Questions 1. ANTIBIOTIC: "What antibiotic are you taking?" "How many times a day?"     Amoxicillin- 2 days- bid 2. ONSET: "When was the antibiotic started?"     2 days ago 3. PAIN: "How bad is the sinus pain?"   (Scale 1-10; mild, moderate or severe)   - MILD (1-3): doesn't interfere with normal activities    - MODERATE (4-7): interferes with normal activities (e.g., work or school) or awakens from sleep   - SEVERE (8-10): excruciating pain and patient unable to do any normal activities        No pain 4. FEVER: "Do you have a fever?" If Yes, ask: "What is it, how was it measured, and when did it start?"      no 5. SYMPTOMS: "Are there any other symptoms you're concerned about?" If Yes, ask: "When did it start?"     Cough/chest congestion  Patient stopped the cough medication and the Mucinex- she had diarrhea.She was not taking the antibiotic at that time.Patient wants to know if she should restart the cough medication.  Answer Assessment - Initial Assessment Questions 1. RESPIRATORY STATUS: "Describe your breathing?" (e.g., wheezing, shortness of breath, unable to speak, severe coughing)      Cough, feels breath is shorter with cough and exertion 2. ONSET: "When did this breathing problem begin?"      OV Monday- amoxicillin - patient started Wednesday 3. PATTERN "Does the difficult breathing come and go, or has it been constant since it started?"      Comes and goes- with coughing or after activity 4. SEVERITY: "How bad is your breathing?" (e.g., mild, moderate, severe)    - MILD: No SOB at rest, mild SOB with walking, speaks normally in sentences, can lie down, no retractions, pulse < 100.    - MODERATE: SOB at rest, SOB with minimal exertion and prefers to sit, cannot lie down flat, speaks in  phrases, mild retractions, audible wheezing, pulse 100-120.    - SEVERE: Very SOB at rest, speaks in single words, struggling to breathe, sitting hunched forward, retractions, pulse > 120      mild 5. RECURRENT SYMPTOM: "Have you had difficulty breathing before?" If Yes, ask: "When was the last time?" and "What happened that time?"      No- not this bad 6. CARDIAC HISTORY: "Do you have any history of heart disease?" (e.g., heart attack, angina, bypass surgery, angioplasty)      PVC hx, hypertension 7. LUNG HISTORY: "Do you have any history of lung disease?"  (e.g., pulmonary embolus, asthma, emphysema)     no 8. CAUSE: "What do you think is causing the breathing problem?"      Congestion in chest 9. OTHER SYMPTOMS: "Do you have any other symptoms? (e.g., dizziness, runny nose, cough, chest pain, fever)     Cough-light green  Protocols used: Breathing Difficulty-A-AH, Sinus Infection on Antibiotic Follow-up Call-A-AH

## 2022-10-13 ENCOUNTER — Ambulatory Visit: Payer: Self-pay | Admitting: *Deleted

## 2022-10-13 NOTE — Telephone Encounter (Signed)
stomach discomfort / rx concern   The patient has recently been prescribed amoxicillin-clavulanate (AUGMENTIN) 875-125 MG tablet [[id number]] and shares that they have been using the bathroom at a highly increased rate  The patient would like to know if they should continue taking the medication  Please contact the patient further when possible    . Chief Complaint: Diarrhea Symptoms: Day 7 of Augmentin, off and on loose stools, worsening last night and this AM. Mostly watery now, with some form, yellowish stools, "Small amounts though."  Frequency: Increasing loose stools yesterday, "Small amounts because I'm not eating much."  States "One explosive this morning." Pertinent Negatives: Patient denies pain,  fever, is staying hydrated.  Disposition: [] ED /[] Urgent Care (no appt availability in office) / [] Appointment(In office/virtual)/ []  [place] [facility]/ [] Home Care/ [] Refused Recommended Disposition /[] [place] [facility]/ [x]  Follow-up with PCP Additional Notes: Pt also states "Cough is no better." States more mucous but I think that's also from my acid reflux." Questioning if she should stay on antibiotic. Please advise.  Care advise provided, pt verbalizes understanding.   Reason for Disposition  Patient wants to stop the antibiotic  Answer Assessment - Initial Assessment Questions 1. ANTIBIOTIC: "What antibiotic are you taking?" "How many times per day?"     Augmentin 2. ANTIBIOTIC ONSET: "When was the antibiotic started?"     On day 7 3. DIARRHEA SEVERITY: "How bad is the diarrhea?" "How many more stools have you had in the past 24 hours than normal?"    - NO DIARRHEA (SCALE 0)   - MILD (SCALE 1-3): Few loose or mushy BMs; increase of 1-3 stools over normal daily number of stools; mild increase in ostomy output.   -  MODERATE (SCALE 4-7): Increase of 4-6 stools daily over normal; moderate increase in ostomy output. * SEVERE (SCALE 8-10; OR 'WORST POSSIBLE'): Increase  of 7 or more stools daily over normal; moderate increase in ostomy output; incontinence.     Moderate 4. ONSET: "When did the diarrhea begin?"      Off and on since on, worsening last night 5. BM CONSISTENCY: "How loose or watery is the diarrhea?"      Some form until yesterday, mostly liquid. 6. VOMITING: "Are you also vomiting?" If Yes, ask: "How many times in the past 24 hours?"      No 7. ABDOMEN PAIN: "Are you having any abdomen pain?" If Yes, ask: "What does it feel like?" (e.g., crampy, dull, intermittent, constant)      No 8. ABDOMEN PAIN SEVERITY: If present, ask: "How bad is the pain?"  (e.g., Scale 1-10; mild, moderate, or severe)   - MILD (1-3): doesn't interfere with normal activities, abdomen soft and not tender to touch    - MODERATE (4-7): interferes with normal activities or awakens from sleep, abdomen tender to touch    - SEVERE (8-10): excruciating pain, doubled over, unable to do any normal activities       NA 9. ORAL INTAKE: If vomiting, "Have you been able to drink liquids?" "How much liquids have you had in the past 24 hours?"     NA 10. HYDRATION: "Any signs of dehydration?" (e.g., dry mouth [not just dry lips], too weak to stand, dizziness, new weight loss) "When did you last urinate?"       STaying hydrated, drinking more 12. OTHER SYMPTOMS: "Do you have any other symptoms?" (e.g., fever, blood in stool)       No. But cough no better  Protocols used: Diarrhea on Antibiotics-A-AH

## 2022-10-13 NOTE — Telephone Encounter (Signed)
Patient informed and agreed to plan. She said she may call Dr Servando Snare to follow up on her GERD since she is still having issues with this as well.  - Sharran Caratachea

## 2022-10-20 ENCOUNTER — Ambulatory Visit: Payer: Self-pay | Admitting: *Deleted

## 2022-10-20 NOTE — Telephone Encounter (Signed)
Message from Allen Kell sent at 10/20/2022  2:19 PM EDT  Summary: bladder infection?   Pt states that she is having a bladder infection, her urine is cloudy, she is going frequently, sometimes it gives her the chills when she go, not much of a pain. Pt is wanting to see if medication could be called in for her. Please advise.          Call History   Type Contact Phone/Fax User  10/20/2022 02:17 PM EDT Phone (Incoming) Gutierrez, Rebecca Harkin (Self) [phone] Rebecca Gutierrez, Rebecca Gutierrez    Reason for Disposition  Urinating more frequently than usual (i.e., frequency)  Answer Assessment - Initial Assessment Questions 1. SYMPTOM: "What's the main symptom you're concerned about?" (e.g., frequency, incontinence)     Frequency, urine is cloudy.    I've had many UTIs.    2. ONSET: "When did the  frequency  start?"     No burning.   Over the weekend it started.   Worse yesterday.    3. PAIN: "Is there any pain?" If Yes, ask: "How bad is it?" (Scale: 1-10; mild, moderate, severe)     No burning 4. CAUSE: "What do you think is causing the symptoms?"     UTI 5. OTHER SYMPTOMS: "Do you have any other symptoms?" (e.g., blood in urine, fever, flank pain, pain with urination)     Cloudy urine.    6. PREGNANCY: "Is there any chance you are pregnant?" "When was your last menstrual period?"     N/A  Protocols used: Urinary Symptoms-A-AH  Chief Complaint: Urinary frequency, cloudy urine Symptoms: above Frequency: Started over the weekend. Pertinent Negatives: Patient denies burning Disposition: [] ED /[] Urgent Care (no appt availability in office) /Appointment(In office/virtual)/ []  [place] [facility]/ [] Home Care/ [] Refused Recommended Disposition /[] [place] [facility]/ []  Follow-up with PCP Additional Notes: Appt made with Dr. Judithann Graves for 10/21/2022 at 10:40. Encouraged to drink plenty of fluids

## 2022-10-21 ENCOUNTER — Ambulatory Visit (INDEPENDENT_AMBULATORY_CARE_PROVIDER_SITE_OTHER): Payer: Medicare Other | Admitting: Internal Medicine

## 2022-10-21 ENCOUNTER — Encounter: Payer: Self-pay | Admitting: Internal Medicine

## 2022-10-21 VITALS — BP 128/70 | HR 62 | Ht 61.0 in | Wt 131.0 lb

## 2022-10-21 DIAGNOSIS — R350 Frequency of micturition: Secondary | ICD-10-CM | POA: Diagnosis not present

## 2022-10-21 DIAGNOSIS — I10 Essential (primary) hypertension: Secondary | ICD-10-CM

## 2022-10-21 LAB — POCT URINALYSIS DIPSTICK
Bilirubin, UA: NEGATIVE
Glucose, UA: NEGATIVE
Ketones, UA: NEGATIVE
Nitrite, UA: NEGATIVE
Protein, UA: NEGATIVE
Spec Grav, UA: 1.015 (ref 1.010–1.025)
Urobilinogen, UA: 0.2 E.U./dL
pH, UA: 6 (ref 5.0–8.0)

## 2022-10-21 MED ORDER — METOPROLOL TARTRATE 25 MG PO TABS
ORAL_TABLET | ORAL | 1 refills | Status: DC
Start: 2022-10-21 — End: 2023-03-02

## 2022-10-21 MED ORDER — RAMIPRIL 10 MG PO CAPS: 10.0000 mg | ORAL_CAPSULE | Freq: Every day | ORAL | 1 refills | Status: DC

## 2022-10-21 MED ORDER — HYDROCHLOROTHIAZIDE 25 MG PO TABS: 25.0000 mg | ORAL_TABLET | Freq: Every day | ORAL | 1 refills | Status: DC

## 2022-10-21 NOTE — Patient Instructions (Addendum)
Increase the Nitrofurantion to twice a day until we get the culture back to direct treatment.

## 2022-10-21 NOTE — Progress Notes (Signed)
Date:  10/21/2022   Name:  Rebecca Gutierrez   DOB:  09/30/1940   MRN:  [id number]   Chief Complaint: Urinary Tract Infection  Urinary Tract Infection  This is a new problem. The current episode started in the past 7 days. The problem occurs intermittently. The problem has been waxing and waning. There has been no fever. Associated symptoms include chills, frequency and urgency. Pertinent negatives include no discharge, flank pain, hematuria, nausea or vomiting. Treatments tried: on Macrobid.    Lab Results  Component Value Date   NA 139 05/04/2022   K 4.0 05/04/2022   CO2 21 05/04/2022   GLUCOSE 118 (H) 05/04/2022   BUN 16 05/04/2022   CREATININE 0.85 05/04/2022   CALCIUM 9.9 05/04/2022   EGFR 70 05/04/2022   GFRNONAA 78 07/22/2020   Lab Results  Component Value Date   CHOL 171 10/30/2021   HDL 59 10/30/2021   LDLCALC 93 10/30/2021   TRIG 108 10/30/2021   CHOLHDL 2.9 10/30/2021   Lab Results  Component Value Date   TSH 2.070 10/30/2021   Lab Results  Component Value Date   HGBA1C 5.6 10/30/2021   Lab Results  Component Value Date   WBC 6.9 05/04/2022   HGB 14.3 05/04/2022   HCT 40.8 05/04/2022   MCV 90 05/04/2022   PLT 240 05/04/2022   Lab Results  Component Value Date   ALT 43 (H) 05/04/2022   AST 40 05/04/2022   ALKPHOS 75 05/04/2022   BILITOT 1.0 05/04/2022   Lab Results  Component Value Date   VD25OH 32.2 04/01/2016     Review of Systems  Constitutional:  Positive for chills.  Respiratory:  Negative for chest tightness and shortness of breath.   Cardiovascular:  Negative for chest pain and palpitations.  Gastrointestinal:  Negative for diarrhea, nausea and vomiting.  Genitourinary:  Positive for frequency and urgency. Negative for dysuria, flank pain and hematuria.  Psychiatric/Behavioral:  Negative for dysphoric mood and sleep disturbance. The patient is not nervous/anxious.     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   Hearing loss 06/02/2007   Essential hypertension, benign 06/02/2007    Allergies  Allergen Reactions   Ciprofloxacin Other (See Comments)    Tendinitis    Levaquin [Levofloxacin] Other (See Comments)    insomnia   Potassium Clavulanate [Clavulanic Acid] Diarrhea   [name] Diarrhea   [name] Palpitations and Rash    Last reaction was in 1969    Past Surgical History:  Procedure Laterality Date   ANTERIOR AND POSTERIOR REPAIR N/A 05/02/2013   Procedure: ANTERIOR (CYSTOCELE) AND POSTERIOR REPAIR (RECTOCELE);  Surgeon: [name], MD;  Location: [facility];  Service: Gynecology;  Laterality: N/A;   BRAVO PH STUDY N/A 10/21/2021   Procedure: BRAVO PH STUDY;  Surgeon: Midge Minium, MD;  Location: [facility] ENDOSCOPY;  Service: Endoscopy;  Laterality: N/A;   CATARACT EXTRACTION, BILATERAL     CHOLECYSTECTOMY  10/15/2020   COLONOSCOPY  08/28/2011   Procedure: COLONOSCOPY;  Surgeon: Malissa Hippo, MD;  Location: [facility];  Service: Endoscopy;  Laterality: N/A;  930   ESOPHAGOGASTRODUODENOSCOPY N/A  10/21/2021   Procedure: ESOPHAGOGASTRODUODENOSCOPY (EGD);  Surgeon: Midge Minium, MD;  Location: [facility] ENDOSCOPY;  Service: Endoscopy;  Laterality: N/A;   EYE SURGERY Bilateral 02/13/2011   other 2013   MOHS SURGERY  06/2016   Removed skin cancer from Rt shin   TUBAL LIGATION     VAGINAL HYSTERECTOMY N/A 05/02/2013   Procedure: HYSTERECTOMY VAGINAL;  Surgeon: [name], MD;  Location: [facility];  Service: Gynecology;  Laterality: N/A;    Social History   Tobacco Use   Smoking status: Never   Smokeless  tobacco: Never  Vaping Use   Vaping Use: Never used  Substance Use Topics   Alcohol use: Yes    Alcohol/week: 1.0 standard drink of alcohol    Types: 1 Standard drinks or equivalent per week   Drug use: Never     Medication list has been reviewed and updated.  Current Meds  Medication Sig   aspirin EC 81 MG tablet Take 81 mg by mouth at bedtime.    Cholecalciferol 50 MCG (2000 UT) TABS Take 1 capsule by mouth in the morning.   estradiol (ESTRACE VAGINAL) 0.1 MG/GM vaginal cream Apply 0.5mg  (pea-sized amount)  just inside the vaginal introitus with a finger-tip on Monday, Wednesday and Friday nights.   nitrofurantoin (MACRODANTIN) 50 MG capsule Take 50 mg by mouth at bedtime.   pantoprazole (PROTONIX) 40 MG tablet TAKE 1 TABLET BY MOUTH AT  BEDTIME   simvastatin (ZOCOR) 10 MG tablet TAKE 1 TABLET BY MOUTH DAILY   [DISCONTINUED] hydrochlorothiazide (HYDRODIURIL) 25 MG tablet Take 1 tablet (25 mg total) by mouth daily.   [DISCONTINUED] metoprolol tartrate (LOPRESSOR) 25 MG tablet TAKE 1 AND 1/2 TABLETS BY MOUTH  TWICE DAILY   [DISCONTINUED] ramipril (ALTACE) 10 MG capsule Take 1 capsule (10 mg total) by mouth daily.       10/21/2022   10:31 AM 07/30/2022   10:26 AM 05/04/2022    9:40 AM 05/01/2022    8:52 AM  GAD 7 : Generalized Anxiety Score  Nervous, Anxious, on Edge 0 0 0 0  Control/stop worrying 0 0 0 0  Worry too much - different things 0 0 0 0  Trouble relaxing 0 0 0 0  Restless 0 0 0 0  Easily annoyed or irritable 0 0 0 0  Afraid - awful might happen 0 0 0 0  Total GAD 7 Score 0 0 0 0  Anxiety Difficulty Not difficult at all Not difficult at all Not difficult at all Not difficult at all       10/21/2022   10:31 AM 07/30/2022   10:26 AM 05/04/2022    9:40 AM  Depression screen PHQ 2/9  Decreased Interest 0 0 0  Down, Depressed, Hopeless 0 0 0  PHQ - 2 Score 0 0 0  Altered sleeping 0 0 0  Tired, decreased energy 0 0 0  Change in appetite 0 0 0  Feeling bad or  failure about yourself  0 0 0  Trouble concentrating 0 0 0  Moving slowly or fidgety/restless 0 0 0  Suicidal thoughts 0 0 0  PHQ-9 Score 0 0 0  Difficult doing work/chores Not difficult at all Not difficult at all Not difficult at all    BP Readings from Last 3 Encounters:  10/21/22 128/70  10/05/22 138/70  07/30/22 136/78    Physical Exam Vitals and nursing note reviewed.  Constitutional:      Appearance: She is well-developed.  Cardiovascular:  Rate and Rhythm: Normal rate and regular rhythm.     Heart sounds: Normal heart sounds.  Pulmonary:     Effort: Pulmonary effort is normal. No respiratory distress.     Breath sounds: Normal breath sounds.  Abdominal:     General: Bowel sounds are normal.     Palpations: Abdomen is soft.     Tenderness: There is abdominal tenderness in the suprapubic area. There is no right CVA tenderness, left CVA tenderness, guarding or rebound.  Musculoskeletal:     Cervical back: Normal range of motion.   .Urine dipstick shows positive for leukocytes.  Micro exam: not done.   Wt Readings from Last 3 Encounters:  10/21/22 131 lb (59.4 kg)  10/05/22 132 lb (59.9 kg)  07/30/22 131 lb (59.4 kg)    BP 128/70   Pulse 62   Ht 5\' 1"  (1.549 m)   Wt 131 lb (59.4 kg)   SpO2 97%   BMI 24.75 kg/m   Assessment and Plan:  Problem List Items Addressed This Visit       Cardiovascular and Mediastinum   Essential hypertension, benign (Chronic)   Relevant Medications   hydrochlorothiazide (HYDRODIURIL) 25 MG tablet   metoprolol tartrate (LOPRESSOR) 25 MG tablet   ramipril (ALTACE) 10 MG capsule   Other Visit Diagnoses     Urinary frequency    -  Primary   she has multiple antibiotic sensitivities will increase nitrofurantoin to bid while waiting for culture results   Relevant Orders   POCT Urinalysis Dipstick (Completed)   Urine Culture       No follow-ups on file.   Partially dictated using Dragon software, any errors are not  intentional.  Reubin Milan, MD [facility] [place], [place]

## 2022-10-23 ENCOUNTER — Other Ambulatory Visit: Payer: Self-pay | Admitting: *Deleted

## 2022-10-23 DIAGNOSIS — N3020 Other chronic cystitis without hematuria: Secondary | ICD-10-CM

## 2022-10-25 NOTE — Progress Notes (Unsigned)
10/26/2022 3:33 PM   Rebecca Gutierrez 21-Feb-1941 [id number]  Referring provider: Reubin Milan, MD [address] [place],  [place] [address]  Urological history: 1. rUTI's -contributing factors of age, GSM and incomplete bladder emptying -cysto (2019) - NED -documented urine cultures over the last year  None -nitrofurantoin 100 mg daily  2. Cystocele -contributing factors age age, vaginal births ***, hysterectomy and GSM -previously manage with a pessary  3. Parapelvic cysts -RUS (2021) - bilaterally   4. Left renal cyst -RUS (2021) - 1.4 cm left kidney  5. Urinary retention -post operatively (2022)     No chief complaint on file.   HPI: Rebecca Gutierrez is a 82 y.o. female who presents today for UTI.  Previous records reviewed.   She was last seen in our office on 05/11/2022 with Dr. Sherron Monday for follow up.  She was at goal w/ nitrofurantoin 100  mg daily for prophylaxis.   She was seen on 10/21/2022 for possible UTI by Dr. Judithann Graves.  Her UA dip noted large leukocytes.  Urine culture is pending. ***  She was instructed to increase her nitrofurantoin to 100 mg BID in the meantime.    UA *** PVR ***  PMH: Past Medical History:  Diagnosis Date   Abnormal ECG 10/28/2017   Anxiety    Bradycardia 11/10/2017   Cancer (HCC)    skin ca   Cystocele 09/13/2013   small recurrent, less than before 09/13/13 JVF    Gilbert's syndrome    Hearing loss 1975   History of recurrent urinary tract infection 03/24/2016   Hyperlipidemia 2000   Hypertension 2000   Recurrent UTI 03/10/2015    Surgical History: Past Surgical History:  Procedure Laterality Date   ANTERIOR AND POSTERIOR REPAIR N/A 05/02/2013   Procedure: ANTERIOR (CYSTOCELE) AND POSTERIOR REPAIR (RECTOCELE);  Surgeon: [name], MD;  Location: [facility];  Service: Gynecology;  Laterality: N/A;   BRAVO PH STUDY N/A 10/21/2021   Procedure: BRAVO PH STUDY;  Surgeon: Midge Minium, MD;   Location: [facility] ENDOSCOPY;  Service: Endoscopy;  Laterality: N/A;   CATARACT EXTRACTION, BILATERAL     CHOLECYSTECTOMY  10/15/2020   COLONOSCOPY  08/28/2011   Procedure: COLONOSCOPY;  Surgeon: Malissa Hippo, MD;  Location: [facility];  Service: Endoscopy;  Laterality: N/A;  930   ESOPHAGOGASTRODUODENOSCOPY N/A 10/21/2021   Procedure: ESOPHAGOGASTRODUODENOSCOPY (EGD);  Surgeon: Midge Minium, MD;  Location: [facility] ENDOSCOPY;  Service: Endoscopy;  Laterality: N/A;   EYE SURGERY Bilateral 02/13/2011   other 2013   MOHS SURGERY  06/2016   Removed skin cancer from Rt shin   TUBAL LIGATION     VAGINAL HYSTERECTOMY N/A 05/02/2013   Procedure: HYSTERECTOMY VAGINAL;  Surgeon: [name], MD;  Location: [facility];  Service: Gynecology;  Laterality: N/A;    Home Medications:  Allergies as of 10/26/2022       Reactions   Ciprofloxacin Other (See Comments)   Tendinitis    Levaquin [levofloxacin] Other (See Comments)   insomnia   Potassium Clavulanate [clavulanic Acid] Diarrhea   [name] Diarrhea   [name] Palpitations, Rash   Last reaction was in 1969        Medication List        Accurate as of Oct 25, 2022  3:33 PM. If you have any questions, ask your nurse or doctor.          aspirin EC 81 MG tablet Take 81 mg by mouth at  bedtime.   Cholecalciferol 50 MCG (2000 UT) Tabs Take 1 capsule by mouth in the morning.   estradiol 0.1 MG/GM vaginal cream Commonly known as: ESTRACE VAGINAL Apply 0.5mg  (pea-sized amount)  just inside the vaginal introitus with a finger-tip on Monday, Wednesday and Friday nights.   hydrochlorothiazide 25 MG tablet Commonly known as: HYDRODIURIL Take 1 tablet (25 mg total) by mouth daily.   metoprolol tartrate 25 MG tablet Commonly known as: LOPRESSOR TAKE 1 AND 1/2 TABLETS BY MOUTH  TWICE DAILY   nitrofurantoin 50 MG capsule Commonly known as: MACRODANTIN Take 50 mg by mouth at bedtime.   pantoprazole 40 MG  tablet Commonly known as: PROTONIX TAKE 1 TABLET BY MOUTH AT  BEDTIME   ramipril 10 MG capsule Commonly known as: ALTACE Take 1 capsule (10 mg total) by mouth daily.   simvastatin 10 MG tablet Commonly known as: ZOCOR TAKE 1 TABLET BY MOUTH DAILY        Allergies:  Allergies  Allergen Reactions   Ciprofloxacin Other (See Comments)    Tendinitis    Levaquin [Levofloxacin] Other (See Comments)    insomnia   Potassium Clavulanate [Clavulanic Acid] Diarrhea   [name] Diarrhea   [name] Palpitations and Rash    Last reaction was in 1969    Family History: Family History  Problem Relation Age of Onset   Hypertension Mother    Cancer Mother        stomach   Healthy Father    Healthy Brother    Colon cancer Neg Hx    Prostate cancer Neg Hx    Kidney cancer Neg Hx    Bladder Cancer Neg Hx    Breast cancer Neg Hx     Social History:  reports that she has never smoked. She has never used smokeless tobacco. She reports current alcohol use of about 1.0 standard drink of alcohol per week. She reports that she does not use drugs.  ROS: Pertinent ROS in HPI  Physical Exam: There were no vitals taken for this visit.  Constitutional:  Well nourished. Alert and oriented, No acute distress. HEENT: [place] AT, moist mucus membranes.  Trachea midline, no masses. Cardiovascular: No clubbing, cyanosis, or edema. Respiratory: Normal respiratory effort, no increased work of breathing. GU: No CVA tenderness.  No bladder fullness or masses. Vulvovaginal atrophy w/ pallor, loss of rugae, introital retraction, excoriations.  Vulvar thinning, fusion of labia, clitoral hood retraction, prominent urethral meatus.   *** external genitalia, *** pubic hair distribution, no lesions.  Normal urethral meatus, no lesions, no prolapse, no discharge.   No urethral masses, tenderness and/or tenderness. No bladder fullness, tenderness or masses. *** vagina mucosa, *** estrogen  effect, no discharge, no lesions, *** pelvic support, *** cystocele and *** rectocele noted.  No cervical motion tenderness.  Uterus is freely mobile and non-fixed.  No adnexal/parametria masses or tenderness noted.  Anus and perineum are without rashes or lesions.   ***  Neurologic: Grossly intact, no focal deficits, moving all 4 extremities. Psychiatric: Normal mood and affect.    Laboratory Data: Lab Results  Component Value Date   WBC 6.9 05/04/2022   HGB 14.3 05/04/2022   HCT 40.8 05/04/2022   MCV 90 05/04/2022   PLT 240 05/04/2022   Lab Results  Component Value Date   CREATININE 0.85 05/04/2022   Lab Results  Component Value Date   HGBA1C 5.6 10/30/2021   Lab Results  Component Value Date   TSH 2.070 10/30/2021  Component Value Date/Time   CHOL 171 10/30/2021 1022   HDL 59 10/30/2021 1022   CHOLHDL 2.9 10/30/2021 1022   CHOLHDL 2.1 09/25/2015 0822   VLDL 13 09/25/2015 0822   LDLCALC 93 10/30/2021 1022   Lab Results  Component Value Date   AST 40 05/04/2022   Lab Results  Component Value Date   ALT 43 (H) 05/04/2022   Urinalysis ***  I have reviewed the labs.   Pertinent Imaging: ***   Assessment & Plan:  ***  1. Suspected UTI -UA -urine sent for culture *** -  2. Incomplete emptying -PVR *** -secondary to cystocele    No follow-ups on file.  These notes generated with voice recognition software. I apologize for typographical errors.  Cloretta Ned  [facility] [address]  [address] [place], [place] [address] [phone]

## 2022-10-26 ENCOUNTER — Ambulatory Visit (INDEPENDENT_AMBULATORY_CARE_PROVIDER_SITE_OTHER): Payer: Medicare Other | Admitting: Urology

## 2022-10-26 ENCOUNTER — Encounter: Payer: Self-pay | Admitting: Urology

## 2022-10-26 ENCOUNTER — Other Ambulatory Visit
Admission: RE | Admit: 2022-10-26 | Discharge: 2022-10-26 | Disposition: A | Discharge status: Home / Self Care | Payer: Medicare Other | Attending: Urology | Admitting: Urology

## 2022-10-26 VITALS — BP 129/71 | HR 67 | Ht 61.0 in | Wt 130.0 lb

## 2022-10-26 DIAGNOSIS — R3989 Other symptoms and signs involving the genitourinary system: Secondary | ICD-10-CM | POA: Diagnosis not present

## 2022-10-26 DIAGNOSIS — N3020 Other chronic cystitis without hematuria: Secondary | ICD-10-CM | POA: Insufficient documentation

## 2022-10-26 DIAGNOSIS — R339 Retention of urine, unspecified: Secondary | ICD-10-CM

## 2022-10-26 LAB — URINALYSIS, COMPLETE (UACMP) WITH MICROSCOPIC
Bilirubin Urine: NEGATIVE
Glucose, UA: NEGATIVE mg/dL
Ketones, ur: NEGATIVE mg/dL
Nitrite: NEGATIVE
Protein, ur: NEGATIVE mg/dL
Specific Gravity, Urine: 1.015 (ref 1.005–1.030)
WBC, UA: >50 WBC/hpf (ref 0–5)
pH: 6.5 (ref 5.0–8.0)

## 2022-10-26 LAB — BLADDER SCAN AMB NON-IMAGING: Scan Result: 297

## 2022-10-27 LAB — URINE CULTURE: Culture: >=100000 — AB

## 2022-10-28 ENCOUNTER — Telehealth: Payer: Self-pay | Admitting: Family Medicine

## 2022-10-28 LAB — URINE CULTURE

## 2022-10-28 MED ORDER — CEPHALEXIN 500 MG PO CAPS: 500.0000 mg | ORAL_CAPSULE | Freq: Four times a day (QID) | ORAL | 0 refills | Status: DC

## 2022-10-28 NOTE — Telephone Encounter (Signed)
-----   Message from Harle Battiest, PA-C sent at 10/28/2022 10:18 AM EDT ----- Please let Rebecca Gutierrez know that her urine culture was positive for infection.  Keflex 500 mg four times daily for seven days then resume her nitrofurantoin once daily.

## 2022-10-28 NOTE — Telephone Encounter (Signed)
Patient notified and ABX sent to pharmacy.  

## 2022-10-29 LAB — URINE CULTURE

## 2022-10-30 ENCOUNTER — Encounter: Payer: Self-pay | Admitting: Internal Medicine

## 2022-10-30 ENCOUNTER — Ambulatory Visit (INDEPENDENT_AMBULATORY_CARE_PROVIDER_SITE_OTHER): Payer: Medicare Other | Admitting: Internal Medicine

## 2022-10-30 VITALS — BP 128/74 | HR 84 | Ht 61.0 in | Wt 129.0 lb

## 2022-10-30 DIAGNOSIS — R7309 Other abnormal glucose: Secondary | ICD-10-CM | POA: Diagnosis not present

## 2022-10-30 DIAGNOSIS — Z0000 Encounter for general adult medical examination without abnormal findings: Secondary | ICD-10-CM | POA: Diagnosis not present

## 2022-10-30 DIAGNOSIS — E785 Hyperlipidemia, unspecified: Secondary | ICD-10-CM

## 2022-10-30 DIAGNOSIS — I700 Atherosclerosis of aorta: Secondary | ICD-10-CM

## 2022-10-30 DIAGNOSIS — I10 Essential (primary) hypertension: Secondary | ICD-10-CM

## 2022-10-30 DIAGNOSIS — K219 Gastro-esophageal reflux disease without esophagitis: Secondary | ICD-10-CM

## 2022-10-30 DIAGNOSIS — Z1231 Encounter for screening mammogram for malignant neoplasm of breast: Secondary | ICD-10-CM

## 2022-10-30 MED ORDER — PANTOPRAZOLE SODIUM 40 MG PO TBEC
40.0000 mg | DELAYED_RELEASE_TABLET | Freq: Every day | ORAL | 3 refills | Status: DC
Start: 2022-10-30 — End: 2023-01-21

## 2022-10-30 MED ORDER — SIMVASTATIN 10 MG PO TABS: 10.0000 mg | ORAL_TABLET | Freq: Every day | ORAL | 3 refills | Status: DC

## 2022-10-30 NOTE — Patient Instructions (Addendum)
Call [facility] to schedule your mammogram at [phone].  Hold Ramipril for 1-2 weeks.  If the cough improves, call for an alternative medication.

## 2022-10-30 NOTE — Assessment & Plan Note (Addendum)
Stable exam with well controlled BP.  Currently taking ramipril, HCTZ and metoprolol. Having on-going dry cough - will hold ramipril for 1-2 weeks. Will continue to recommend low sodium diet.

## 2022-10-30 NOTE — Assessment & Plan Note (Addendum)
Reflux symptoms are minimal on current therapy - pantoprazole. No red flag signs such as weight loss, n/v, melena

## 2022-10-30 NOTE — Assessment & Plan Note (Signed)
On appropriate statin and aspirin Lab Results  Component Value Date   LDLCALC 93 10/30/2021

## 2022-10-30 NOTE — Progress Notes (Signed)
Date:  10/30/2022   Name:  Rebecca Gutierrez   DOB:  Jul 08, 1940   MRN:  [id number]   Chief Complaint: Annual Exam Rebecca Gutierrez is a 82 y.o. female who presents today for her Complete Annual Exam. She feels well. She reports exercising - some. She reports she is sleeping well. Breast complaints - none.  Mammogram: 12/2021 DEXA: 12/2020 stable osteopenia Colonoscopy: aged out  Health Maintenance Due  Topic Date Due   COVID-19 Vaccine (5 - 2023-24 season) 02/13/2022   Medicare Annual Wellness (AWV)  10/23/2022    Immunization History  Administered Date(s) Administered   Fluad Quad(high Dose 65+) 04/18/2019, 04/29/2020, 04/23/2022   Influenza Split 04/01/2015   Influenza Whole 03/25/2006, 05/25/2011   Influenza, High Dose Seasonal PF 03/31/2017, 04/22/2018, 03/19/2021   Influenza,inj,Quad PF,6+ Mos 03/01/2013, 03/14/2014, 03/24/2016   PFIZER(Purple Top)SARS-COV-2 Vaccination 06/29/2019, 07/20/2019, 04/03/2020   Pfizer Covid-19 Vaccine Bivalent Booster 22yrs & up 03/11/2021   Pneumococcal Conjugate-13 09/17/2014   Pneumococcal Polysaccharide-23 09/23/2009   Td 02/04/2004, 03/14/2014   Td (Adult), 2 Lf Tetanus Toxid, Preservative Free 02/04/2004, 03/14/2014   Zoster Recombinat (Shingrix) 01/09/2019, 05/18/2019   Zoster, Live 07/17/2010    Hypertension This is a chronic problem. The problem is controlled. Pertinent negatives include no chest pain, headaches, palpitations or shortness of breath. Past treatments include ACE inhibitors, calcium channel blockers and diuretics. The current treatment provides significant improvement. There is no history of kidney disease, CAD/MI or CVA.  Hyperlipidemia This is a chronic problem. The problem is controlled. Pertinent negatives include no chest pain or shortness of breath. Current antihyperlipidemic treatment includes statins.  Gastroesophageal Reflux She complains of coughing and heartburn. She reports no abdominal pain, no chest pain  or no wheezing. This is a recurrent problem. The problem occurs rarely. Pertinent negatives include no fatigue. She has tried a PPI for the symptoms.  Cough This is a chronic problem. The problem has been unchanged. The cough is Non-productive. Associated symptoms include heartburn. Pertinent negatives include no chest pain, chills, fever, headaches, rash, shortness of breath or wheezing. Treatments tried: PPI. The treatment provided mild relief.    Lab Results  Component Value Date   NA 139 05/04/2022   K 4.0 05/04/2022   CO2 21 05/04/2022   GLUCOSE 118 (H) 05/04/2022   BUN 16 05/04/2022   CREATININE 0.85 05/04/2022   CALCIUM 9.9 05/04/2022   EGFR 70 05/04/2022   GFRNONAA 78 07/22/2020   Lab Results  Component Value Date   CHOL 171 10/30/2021   HDL 59 10/30/2021   LDLCALC 93 10/30/2021   TRIG 108 10/30/2021   CHOLHDL 2.9 10/30/2021   Lab Results  Component Value Date   TSH 2.070 10/30/2021   Lab Results  Component Value Date   HGBA1C 5.6 10/30/2021   Lab Results  Component Value Date   WBC 6.9 05/04/2022   HGB 14.3 05/04/2022   HCT 40.8 05/04/2022   MCV 90 05/04/2022   PLT 240 05/04/2022   Lab Results  Component Value Date   ALT 43 (H) 05/04/2022   AST 40 05/04/2022   ALKPHOS 75 05/04/2022   BILITOT 1.0 05/04/2022   Lab Results  Component Value Date   VD25OH 32.2 04/01/2016     Review of Systems  Constitutional:  Negative for chills, fatigue and fever.  HENT:  Negative for congestion, hearing loss, tinnitus, trouble swallowing and voice change.   Eyes:  Negative for visual disturbance.  Respiratory:  Positive for cough. Negative for  chest tightness, shortness of breath and wheezing.   Cardiovascular:  Negative for chest pain, palpitations and leg swelling.  Gastrointestinal:  Positive for heartburn. Negative for abdominal pain, constipation, diarrhea and vomiting.  Endocrine: Negative for polydipsia and polyuria.  Genitourinary:  Negative for dysuria,  frequency, genital sores, vaginal bleeding and vaginal discharge.  Musculoskeletal:  Negative for arthralgias, gait problem and joint swelling.  Skin:  Negative for color change and rash.  Neurological:  Negative for dizziness, tremors, light-headedness and headaches.  Hematological:  Negative for adenopathy. Does not bruise/bleed easily.  Psychiatric/Behavioral:  Negative for dysphoric mood and sleep disturbance. The patient is not nervous/anxious.     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   Hearing loss 06/02/2007   Essential hypertension, benign 06/02/2007    Allergies  Allergen Reactions   Ciprofloxacin Other (See Comments)    Tendinitis    Levaquin [Levofloxacin] Other (See Comments)    insomnia   Potassium Clavulanate [Clavulanic Acid] Diarrhea   [name] Diarrhea   [name] Palpitations and Rash    Last reaction was in 1969    Past Surgical History:  Procedure Laterality Date   ANTERIOR AND POSTERIOR REPAIR N/A 05/02/2013   Procedure: ANTERIOR (CYSTOCELE) AND POSTERIOR REPAIR (RECTOCELE);  Surgeon: [name], MD;  Location: [facility];  Service: Gynecology;  Laterality: N/A;   BRAVO PH STUDY N/A 10/21/2021   Procedure: BRAVO PH STUDY;  Surgeon: Midge Minium, MD;  Location[facility];  Service: Endoscopy;  Laterality: N/A;   CATARACT EXTRACTION, BILATERAL     CHOLECYSTECTOMY  10/15/2020   COLONOSCOPY  08/28/2011   Procedure: COLONOSCOPY;  Surgeon: Malissa Hippo, MD;  Location: [facility];  Service: Endoscopy;  Laterality: N/A;  930   ESOPHAGOGASTRODUODENOSCOPY  N/A 10/21/2021   Procedure: ESOPHAGOGASTRODUODENOSCOPY (EGD);  Surgeon: Midge Minium, MD;  Location[facility] ENDOSCOPY;  Service: Endoscopy;  Laterality: N/A;   EYE SURGERY Bilateral 02/13/2011   other 2013   MOHS SURGERY  06/2016   Removed skin cancer from Rt shin   TUBAL LIGATION     VAGINAL HYSTERECTOMY N/A 05/02/2013   Procedure: HYSTERECTOMY VAGINAL;  Surgeon: [name], MD;  Location: [facility];  Service: Gynecology;  Laterality: N/A;    Social History   Tobacco Use   Smoking status: Never   Smokeless tobacco: Never  Vaping Use   Vaping Use: Never used  Substance Use Topics   Alcohol use: Yes    Alcohol/week: 1.0 standard drink of alcohol    Types: 1 Standard drinks or equivalent per week   Drug use: Never     Medication list has been reviewed and updated.  Current Meds  Medication Sig   aspirin EC 81 MG tablet Take 81 mg by mouth at bedtime.    cephALEXin (KEFLEX) 500 MG capsule Take 1 capsule (500 mg total) by mouth 4 (four) times daily.   Cholecalciferol 50 MCG (2000 UT) TABS Take 1 capsule by mouth in the morning.   estradiol (ESTRACE VAGINAL) 0.1 MG/GM vaginal cream Apply 0.5mg  (pea-sized amount)  just inside the vaginal introitus with a finger-tip on Monday, Wednesday and Friday nights.   hydrochlorothiazide (  HYDRODIURIL) 25 MG tablet Take 1 tablet (25 mg total) by mouth daily.   metoprolol tartrate (LOPRESSOR) 25 MG tablet TAKE 1 AND 1/2 TABLETS BY MOUTH  TWICE DAILY   nitrofurantoin (MACRODANTIN) 50 MG capsule Take 50 mg by mouth at bedtime.   ramipril (ALTACE) 10 MG capsule Take 1 capsule (10 mg total) by mouth daily.   [DISCONTINUED] pantoprazole (PROTONIX) 40 MG tablet TAKE 1 TABLET BY MOUTH AT  BEDTIME   [DISCONTINUED] simvastatin (ZOCOR) 10 MG tablet TAKE 1 TABLET BY MOUTH DAILY       10/30/2022    9:32 AM 10/21/2022   10:31 AM 07/30/2022   10:26 AM 05/04/2022    9:40 AM  GAD 7 : Generalized Anxiety Score  Nervous, Anxious, on Edge 0 0 0 0  Control/stop  worrying 0 0 0 0  Worry too much - different things 0 0 0 0  Trouble relaxing 0 0 0 0  Restless 0 0 0 0  Easily annoyed or irritable 0 0 0 0  Afraid - awful might happen 0 0 0 0  Total GAD 7 Score 0 0 0 0  Anxiety Difficulty Not difficult at all Not difficult at all Not difficult at all Not difficult at all       10/30/2022    9:31 AM 10/21/2022   10:31 AM 07/30/2022   10:26 AM  Depression screen PHQ 2/9  Decreased Interest 0 0 0  Down, Depressed, Hopeless 0 0 0  PHQ - 2 Score 0 0 0  Altered sleeping 0 0 0  Tired, decreased energy 0 0 0  Change in appetite 0 0 0  Feeling bad or failure about yourself  0 0 0  Trouble concentrating 0 0 0  Moving slowly or fidgety/restless 0 0 0  Suicidal thoughts 0 0 0  PHQ-9 Score 0 0 0  Difficult doing work/chores Not difficult at all Not difficult at all Not difficult at all    BP Readings from Last 3 Encounters:  10/30/22 128/74  10/26/22 129/71  10/21/22 128/70    Physical Exam Vitals and nursing note reviewed.  Constitutional:      General: She is not in acute distress.    Appearance: She is well-developed.  HENT:     Head: Normocephalic and atraumatic.     Right Ear: Tympanic membrane and ear canal normal.     Left Ear: Tympanic membrane and ear canal normal.     Nose:     Right Sinus: No maxillary sinus tenderness.     Left Sinus: No maxillary sinus tenderness.  Eyes:     General: No scleral icterus.       Right eye: No discharge.        Left eye: No discharge.     Conjunctiva/sclera: Conjunctivae normal.  Neck:     Thyroid: No thyromegaly.     Vascular: No carotid bruit.  Cardiovascular:     Rate and Rhythm: Normal rate and regular rhythm.     Pulses: Normal pulses.     Heart sounds: Normal heart sounds.  Pulmonary:     Effort: Pulmonary effort is normal. No respiratory distress.     Breath sounds: No wheezing.  Chest:  Breasts:    Right: No mass, nipple discharge, skin change or tenderness.     Left: No mass,  nipple discharge, skin change or tenderness.  Abdominal:     General: Bowel sounds are normal.     Palpations: Abdomen is soft.  Tenderness: There is no abdominal tenderness.  Musculoskeletal:     Cervical back: Normal range of motion. No erythema.     Right lower leg: No edema.     Left lower leg: No edema.  Lymphadenopathy:     Cervical: No cervical adenopathy.  Skin:    General: Skin is warm and dry.     Findings: No rash.  Neurological:     General: No focal deficit present.     Mental Status: She is alert and oriented to person, place, and time.     Cranial Nerves: No cranial nerve deficit.     Sensory: No sensory deficit.     Deep Tendon Reflexes: Reflexes are normal and symmetric.  Psychiatric:        Attention and Perception: Attention normal.        Mood and Affect: Mood normal.     Wt Readings from Last 3 Encounters:  10/30/22 129 lb (58.5 kg)  10/26/22 130 lb (59 kg)  10/21/22 131 lb (59.4 kg)    BP 128/74   Pulse 84   Ht 5\' 1"  (1.549 m)   Wt 129 lb (58.5 kg)   SpO2 95%   BMI 24.37 kg/m   Assessment and Plan:  Problem List Items Addressed This Visit     Hyperlipidemia LDL goal <100 (Chronic)   Relevant Medications   simvastatin (ZOCOR) 10 MG tablet   Essential hypertension, benign (Chronic)    Stable exam with well controlled BP.  Currently taking ramipril, HCTZ and metoprolol. Having on-going dry cough - will hold ramipril for 1-2 weeks. Will continue to recommend low sodium diet.       Relevant Medications   simvastatin (ZOCOR) 10 MG tablet   Other Relevant Orders   CBC with Differential/Platelet   Comprehensive metabolic panel   TSH   Aortic atherosclerosis (HCC) (Chronic)    On appropriate statin and aspirin Lab Results  Component Value Date   LDLCALC 93 10/30/2021        Relevant Medications   simvastatin (ZOCOR) 10 MG tablet   Other Relevant Orders   Lipid panel   Gastroesophageal reflux disease (Chronic)    Reflux symptoms  are minimal on current therapy - pantoprazole. No red flag signs such as weight loss, n/v, melena       Relevant Medications   pantoprazole (PROTONIX) 40 MG tablet   Other Relevant Orders   CBC with Differential/Platelet   Other Visit Diagnoses     Annual physical exam    -  Primary   Encounter for screening mammogram for breast cancer       Relevant Orders   MM 3D SCREENING MAMMOGRAM BILATERAL BREAST   Elevated glucose level       Relevant Orders   Hemoglobin A1c       Return in about 6 months (around 05/02/2023) for HTN.   Partially dictated using Dragon software, any errors are not intentional.  Reubin Milan, MD [facility] [place], [place]

## 2022-10-31 LAB — CBC WITH DIFFERENTIAL/PLATELET
Basophils Absolute: 0 10*3/uL (ref 0.0–0.2)
Basos: 1 %
EOS (ABSOLUTE): 0.2 10*3/uL (ref 0.0–0.4)
Eos: 4 %
Hematocrit: 42.3 % (ref 34.0–46.6)
Hemoglobin: 13.7 g/dL (ref 11.1–15.9)
Immature Grans (Abs): 0 10*3/uL (ref 0.0–0.1)
Immature Granulocytes: 0 %
Lymphocytes Absolute: 0.9 10*3/uL (ref 0.7–3.1)
Lymphs: 18 %
MCH: 28.8 pg (ref 26.6–33.0)
MCHC: 32.4 g/dL (ref 31.5–35.7)
MCV: 89 fL (ref 79–97)
Monocytes Absolute: 0.4 10*3/uL (ref 0.1–0.9)
Monocytes: 8 %
Neutrophils Absolute: 3.5 10*3/uL (ref 1.4–7.0)
Neutrophils: 69 %
Platelets: 161 10*3/uL (ref 150–450)
RBC: 4.75 x10E6/uL (ref 3.77–5.28)
RDW: 12.8 % (ref 11.7–15.4)
WBC: 4.9 10*3/uL (ref 3.4–10.8)

## 2022-10-31 LAB — COMPREHENSIVE METABOLIC PANEL
ALT: 32 IU/L (ref 0–32)
AST: 35 IU/L (ref 0–40)
Albumin/Globulin Ratio: 1.2 (ref 1.2–2.2)
Albumin: 4.1 g/dL (ref 3.8–4.8)
Alkaline Phosphatase: 73 IU/L (ref 44–121)
BUN/Creatinine Ratio: 21 (ref 12–28)
BUN: 18 mg/dL (ref 8–27)
Bilirubin Total: 1.4 mg/dL — ABNORMAL HIGH (ref 0.0–1.2)
CO2: 24 mmol/L (ref 20–29)
Calcium: 9.6 mg/dL (ref 8.7–10.3)
Chloride: 101 mmol/L (ref 96–106)
Creatinine, Ser: 0.86 mg/dL (ref 0.57–1.00)
Globulin, Total: 3.5 g/dL (ref 1.5–4.5)
Glucose: 109 mg/dL — ABNORMAL HIGH (ref 70–99)
Potassium: 4.2 mmol/L (ref 3.5–5.2)
Sodium: 138 mmol/L (ref 134–144)
Total Protein: 7.6 g/dL (ref 6.0–8.5)
eGFR: 68 mL/min/{1.73_m2} (ref >59)

## 2022-10-31 LAB — LIPID PANEL
Chol/HDL Ratio: 2.9 ratio (ref 0.0–4.4)
Cholesterol, Total: 163 mg/dL (ref 100–199)
HDL: 57 mg/dL (ref >39)
LDL Chol Calc (NIH): 89 mg/dL (ref 0–99)
Triglycerides: 94 mg/dL (ref 0–149)
VLDL Cholesterol Cal: 17 mg/dL (ref 5–40)

## 2022-10-31 LAB — TSH: TSH: 2.98 u[IU]/mL (ref 0.450–4.500)

## 2022-10-31 LAB — HEMOGLOBIN A1C
Est. average glucose Bld gHb Est-mCnc: 120 mg/dL
Hgb A1c MFr Bld: 5.8 % — ABNORMAL HIGH (ref 4.8–5.6)

## 2022-11-04 ENCOUNTER — Ambulatory Visit (INDEPENDENT_AMBULATORY_CARE_PROVIDER_SITE_OTHER): Payer: Medicare Other

## 2022-11-04 VITALS — Ht 61.0 in | Wt 129.0 lb

## 2022-11-04 DIAGNOSIS — Z0000 Encounter for general adult medical examination without abnormal findings: Secondary | ICD-10-CM | POA: Diagnosis not present

## 2022-11-04 NOTE — Patient Instructions (Signed)
Rebecca Gutierrez , Thank you for taking time to come for your Medicare Wellness Visit. I appreciate your ongoing commitment to your health goals. Please review the following plan we discussed and let me know if I can assist you in the future.   These are the goals we discussed:  Goals      DIET - EAT MORE FRUITS AND VEGETABLES     DIET - INCREASE WATER INTAKE     Recommend to drink at least 6-8 8oz glasses of water per day.        This is a list of the screening recommended for you and due dates:  Health Maintenance  Topic Date Due   COVID-19 Vaccine (5 - 2023-24 season) 02/13/2022   Mammogram  12/26/2022   Flu Shot  01/14/2023   Medicare Annual Wellness Visit  11/04/2023   DTaP/Tdap/Td vaccine (5 - Tdap) 03/14/2024   Pneumonia Vaccine  Completed   DEXA scan (bone density measurement)  Completed   Zoster (Shingles) Vaccine  Completed   HPV Vaccine  Aged Out    Advanced directives: yes  Conditions/risks identified: none  Next appointment: Follow up in one year for your annual wellness visit 11/10/23 @ 9:45 am by phone   Preventive Care 65 Years and Older, Female Preventive care refers to lifestyle choices and visits with your health care provider that can promote health and wellness. What does preventive care include? A yearly physical exam. This is also called an annual well check. Dental exams once or twice a year. Routine eye exams. Ask your health care provider how often you should have your eyes checked. Personal lifestyle choices, including: Daily care of your teeth and gums. Regular physical activity. Eating a healthy diet. Avoiding tobacco and drug use. Limiting alcohol use. Practicing safe sex. Taking low-dose aspirin every day. Taking vitamin and mineral supplements as recommended by your health care provider. What happens during an annual well check? The services and screenings done by your health care provider during your annual well check will depend on your  age, overall health, lifestyle risk factors, and family history of disease. Counseling  Your health care provider may ask you questions about your: Alcohol use. Tobacco use. Drug use. Emotional well-being. Home and relationship well-being. Sexual activity. Eating habits. History of falls. Memory and ability to understand (cognition). Work and work Astronomer. Reproductive health. Screening  You may have the following tests or measurements: Height, weight, and BMI. Blood pressure. Lipid and cholesterol levels. These may be checked every 5 years, or more frequently if you are over 24 years old. Skin check. Lung cancer screening. You may have this screening every year starting at age 22 if you have a 30-pack-year history of smoking and currently smoke or have quit within the past 15 years. Fecal occult blood test (FOBT) of the stool. You may have this test every year starting at age 19. Flexible sigmoidoscopy or colonoscopy. You may have a sigmoidoscopy every 5 years or a colonoscopy every 10 years starting at age 75. Hepatitis C blood test. Hepatitis B blood test. Sexually transmitted disease (STD) testing. Diabetes screening. This is done by checking your blood sugar (glucose) after you have not eaten for a while (fasting). You may have this done every 1-3 years. Bone density scan. This is done to screen for osteoporosis. You may have this done starting at age 45. Mammogram. This may be done every 1-2 years. Talk to your health care provider about how often you should have regular  mammograms. Talk with your health care provider about your test results, treatment options, and if necessary, the need for more tests. Vaccines  Your health care provider may recommend certain vaccines, such as: Influenza vaccine. This is recommended every year. Tetanus, diphtheria, and acellular pertussis (Tdap, Td) vaccine. You may need a Td booster every 10 years. Zoster vaccine. You may need this after  age 38. Pneumococcal 13-valent conjugate (PCV13) vaccine. One dose is recommended after age 71. Pneumococcal polysaccharide (PPSV23) vaccine. One dose is recommended after age 17. Talk to your health care provider about which screenings and vaccines you need and how often you need them. This information is not intended to replace advice given to you by your health care provider. Make sure you discuss any questions you have with your health care provider. Document Released: 06/28/2015 Document Revised: 02/19/2016 Document Reviewed: 04/02/2015 Elsevier Interactive Patient Education  2017 [employer].  Fall Prevention in the Home Falls can cause injuries. They can happen to people of all ages. There are many things you can do to make your home safe and to help prevent falls. What can I do on the outside of my home? Regularly fix the edges of walkways and driveways and fix any cracks. Remove anything that might make you trip as you walk through a door, such as a raised step or threshold. Trim any bushes or trees on the path to your home. Use bright outdoor lighting. Clear any walking paths of anything that might make someone trip, such as rocks or tools. Regularly check to see if handrails are loose or broken. Make sure that both sides of any steps have handrails. Any raised decks and porches should have guardrails on the edges. Have any leaves, snow, or ice cleared regularly. Use sand or salt on walking paths during winter. Clean up any spills in your garage right away. This includes oil or grease spills. What can I do in the bathroom? Use night lights. Install grab bars by the toilet and in the tub and shower. Do not use towel bars as grab bars. Use non-skid mats or decals in the tub or shower. If you need to sit down in the shower, use a plastic, non-slip stool. Keep the floor dry. Clean up any water that spills on the floor as soon as it happens. Remove soap buildup in the tub or shower  regularly. Attach bath mats securely with double-sided non-slip rug tape. Do not have throw rugs and other things on the floor that can make you trip. What can I do in the bedroom? Use night lights. Make sure that you have a light by your bed that is easy to reach. Do not use any sheets or blankets that are too big for your bed. They should not hang down onto the floor. Have a firm chair that has side arms. You can use this for support while you get dressed. Do not have throw rugs and other things on the floor that can make you trip. What can I do in the kitchen? Clean up any spills right away. Avoid walking on wet floors. Keep items that you use a lot in easy-to-reach places. If you need to reach something above you, use a strong step stool that has a grab bar. Keep electrical cords out of the way. Do not use floor polish or wax that makes floors slippery. If you must use wax, use non-skid floor wax. Do not have throw rugs and other things on the floor that can  make you trip. What can I do with my stairs? Do not leave any items on the stairs. Make sure that there are handrails on both sides of the stairs and use them. Fix handrails that are broken or loose. Make sure that handrails are as long as the stairways. Check any carpeting to make sure that it is firmly attached to the stairs. Fix any carpet that is loose or worn. Avoid having throw rugs at the top or bottom of the stairs. If you do have throw rugs, attach them to the floor with carpet tape. Make sure that you have a light switch at the top of the stairs and the bottom of the stairs. If you do not have them, ask someone to add them for you. What else can I do to help prevent falls? Wear shoes that: Do not have high heels. Have rubber bottoms. Are comfortable and fit you well. Are closed at the toe. Do not wear sandals. If you use a stepladder: Make sure that it is fully opened. Do not climb a closed stepladder. Make sure that  both sides of the stepladder are locked into place. Ask someone to hold it for you, if possible. Clearly mark and make sure that you can see: Any grab bars or handrails. First and last steps. Where the edge of each step is. Use tools that help you move around (mobility aids) if they are needed. These include: Canes. Walkers. Scooters. Crutches. Turn on the lights when you go into a dark area. Replace any light bulbs as soon as they burn out. Set up your furniture so you have a clear path. Avoid moving your furniture around. If any of your floors are uneven, fix them. If there are any pets around you, be aware of where they are. Review your medicines with your doctor. Some medicines can make you feel dizzy. This can increase your chance of falling. Ask your doctor what other things that you can do to help prevent falls. This information is not intended to replace advice given to you by your health care provider. Make sure you discuss any questions you have with your health care provider. Document Released: 03/28/2009 Document Revised: 11/07/2015 Document Reviewed: 07/06/2014 Elsevier Interactive Patient Education  2017 [employer].

## 2022-11-04 NOTE — Progress Notes (Signed)
I connected with  Rebecca Gutierrez on 11/04/22 by a audio enabled telemedicine application and verified that I am speaking with the correct person using two identifiers.  Patient Location: Home  Provider Location: Office/Clinic  I discussed the limitations of evaluation and management by telemedicine. The patient expressed understanding and agreed to proceed.  Subjective:   Rebecca Gutierrez is a 82 y.o. female who presents for Medicare Annual (Subsequent) preventive examination.  Review of Systems     Cardiac Risk Factors include: advanced age (>33men, >54 women);dyslipidemia;hypertension     Objective:    Today's Vitals   11/04/22 1003  PainSc: 0-No pain   There is no height or weight on file to calculate BMI.     11/04/2022   10:09 AM 10/22/2021    9:57 AM 10/21/2021    7:14 AM 10/21/2020    2:22 PM 10/18/2019    1:30 PM 10/10/2018    9:33 AM 10/04/2017    9:11 AM  Advanced Directives  Does Patient Have a Medical Advance Directive? Yes Yes Yes Yes Yes Yes Yes  Type of Estate agent of [place];Living will Healthcare Power of [place];Living will Healthcare Power of [place];Living will Healthcare Power of [place];Living will Healthcare Power of [place];Living will Living will;Healthcare Power of [employer] Power of [place];Living will  Does patient want to make changes to medical advance directive? No - Patient declined        Copy of Healthcare Power of Attorney in Chart? Yes - validated most recent copy scanned in chart (See row information) Yes - validated most recent copy scanned in chart (See row information) No - copy requested No - copy requested No - copy requested No - copy requested No - copy requested    Current Medications (verified) Outpatient Encounter Medications as of 11/04/2022  Medication Sig   aspirin EC 81 MG tablet Take 81 mg by mouth at bedtime.    cephALEXin (KEFLEX) 500 MG capsule Take 1 capsule (500 mg total) by mouth  4 (four) times daily.   Cholecalciferol 50 MCG (2000 UT) TABS Take 1 capsule by mouth in the morning.   estradiol (ESTRACE VAGINAL) 0.1 MG/GM vaginal cream Apply 0.5mg  (pea-sized amount)  just inside the vaginal introitus with a finger-tip on Monday, Wednesday and Friday nights.   hydrochlorothiazide (HYDRODIURIL) 25 MG tablet Take 1 tablet (25 mg total) by mouth daily.   metoprolol tartrate (LOPRESSOR) 25 MG tablet TAKE 1 AND 1/2 TABLETS BY MOUTH  TWICE DAILY   nitrofurantoin (MACRODANTIN) 50 MG capsule Take 50 mg by mouth at bedtime.   pantoprazole (PROTONIX) 40 MG tablet Take 1 tablet (40 mg total) by mouth at bedtime.   ramipril (ALTACE) 10 MG capsule Take 1 capsule (10 mg total) by mouth daily.   simvastatin (ZOCOR) 10 MG tablet Take 1 tablet (10 mg total) by mouth daily.   No facility-administered encounter medications on file as of 11/04/2022.    Allergies (verified) Ciprofloxacin, Levaquin [levofloxacin], Potassium clavulanate [clavulanic acid], [name], and [name]   History: Past Medical History:  Diagnosis Date   Abnormal ECG 10/28/2017   Anxiety    Bradycardia 11/10/2017   Cancer (HCC)    skin ca   Cystocele 09/13/2013   small recurrent, less than before 09/13/13 JVF    Gilbert's syndrome    Hearing loss 1975   History of recurrent urinary tract infection 03/24/2016   Hyperlipidemia 2000   Hypertension 2000   Recurrent UTI 03/10/2015   Past Surgical History:  Procedure Laterality Date   ANTERIOR AND POSTERIOR REPAIR N/A 05/02/2013   Procedure: ANTERIOR (CYSTOCELE) AND POSTERIOR REPAIR (RECTOCELE);  Surgeon: [name], MD;  Location: [facility];  Service: Gynecology;  Laterality: N/A;   BRAVO PH STUDY N/A 10/21/2021   Procedure: BRAVO PH STUDY;  Surgeon: Midge Minium, MD;  Location: [facility] ENDOSCOPY;  Service: Endoscopy;  Laterality: N/A;   CATARACT EXTRACTION, BILATERAL     CHOLECYSTECTOMY  10/15/2020   COLONOSCOPY  08/28/2011    Procedure: COLONOSCOPY;  Surgeon: Malissa Hippo, MD;  Location: [facility] ENDO SU[facility];  Service: Endoscopy;  Laterality: N/A;  930   ESOPHAGOGASTRODUODENOSCOPY N/A 10/21/2021   Procedure: ESOPHAGOGASTRODUODENOSCOPY (EGD);  Surgeon: Midge Minium, MD;  Location: [facility] ENDOSCOPY;  Service: Endoscopy;  Laterality: N/A;   EYE SURGERY Bilateral 02/13/2011   other 2013   MOHS SURGERY  06/2016   Removed skin cancer from Rt shin   TUBAL LIGATION     VAGINAL HYSTERECTOMY N/A 05/02/2013   Procedure: HYSTERECTOMY VAGINAL;  Surgeon: [name], MD;  Location: [facility];  Service: Gynecology;  Laterality: N/A;   Family History  Problem Relation Age of Onset   Hypertension Mother    Cancer Mother        stomach   Healthy Father    Healthy Brother    Colon cancer Neg Hx    Prostate cancer Neg Hx    Kidney cancer Neg Hx    Bladder Cancer Neg Hx    Breast cancer Neg Hx    Social History   Socioeconomic History   Marital status: Widowed    Spouse name: Not on file   Number of children: 2   Years of education: 12   Highest education level: High school graduate  Occupational History   Occupation: Retired  Tobacco Use   Smoking status: Never   Smokeless tobacco: Never  Vaping Use   Vaping Use: Never used  Substance and Sexual Activity   Alcohol use: Yes    Alcohol/week: 1.0 standard drink of alcohol    Types: 1 Standard drinks or equivalent per week   Drug use: Never   Sexual activity: Never    Partners: Male    Birth control/protection: Post-menopausal, Surgical  Other Topics Concern   Not on file  Social History Narrative   Pt lives alone   Social Determinants of Health   Financial Resource Strain: Low Risk  (11/04/2022)   Overall Financial Resource Strain (CARDIA)    Difficulty of Paying Living Expenses: Not hard at all  Food Insecurity: No Food Insecurity (11/04/2022)   Hunger Vital Sign    Worried About Running Out of Food in the Last Year: Never true    Ran Out of Food in the Last  Year: Never true  Transportation Needs: No Transportation Needs (11/04/2022)   PRAPARE - Administrator, Civil Service (Medical): No    Lack of Transportation (Non-Medical): No  Physical Activity: Inactive (11/04/2022)   Exercise Vital Sign    Days of Exercise per Week: 0 days    Minutes of Exercise per Session: 0 min  Stress: No Stress Concern Present (11/04/2022)   [employer] of Occupational Health - Occupational Stress Questionnaire    Feeling of Stress : Not at all  Social Connections: Socially Isolated (11/04/2022)   Social Connection and Isolation Panel [NHANES]    Frequency of Communication with Friends and Family: More than three times a week    Frequency of Social Gatherings with Friends and  Family: Not on file    Attends Religious Services: Never    Active Member of Clubs or Organizations: No    Attends Banker Meetings: Never    Marital Status: Widowed    Tobacco Counseling Counseling given: Not Answered   Clinical Intake:  Pre-visit preparation completed: Yes  Pain : No/denies pain Pain Score: 0-No pain     Nutritional Risks: None Diabetes: No  How often do you need to have someone help you when you read instructions, pamphlets, or other written materials from your doctor or pharmacy?: 1 - Never  Diabetic?no  Interpreter Needed?: No  Information entered by :: Kennedy Bucker, LPN   Activities of Daily Living    11/04/2022   10:10 AM  In your present state of health, do you have any difficulty performing the following activities:  Hearing? 1  Vision? 0  Difficulty concentrating or making decisions? 0  Walking or climbing stairs? 0  Dressing or bathing? 0  Doing errands, shopping? 0  Preparing Food and eating ? N  Using the Toilet? N  In the past six months, have you accidently leaked urine? N  Do you have problems with loss of bowel control? N  Managing your Medications? N  Managing your Finances? N  Housekeeping or  managing your Housekeeping? N    Patient Care Team: Reubin Milan, MD as PCP - General (Internal Medicine) Jesusita Oka, MD (Dermatology) Lamar Blinks, MD as Consulting Physician (Cardiology) Midge Minium, MD as Consulting Physician (Gastroenterology) Alfredo Martinez, MD as Consulting Physician (Urology)  Indicate any recent Medical Services you may have received from other than [facility] providers in the past year (date may be approximate).     Assessment:   This is a routine wellness examination for Marilynn.  Hearing/Vision screen Hearing Screening - Comments:: Wears aids Vision Screening - Comments:: Wears glasses- [facility]  Dietary issues and exercise activities discussed: Current Exercise Habits: The patient does not participate in regular exercise at present, Exercise limited by: None identified   Goals Addressed             This Visit's Progress    DIET - EAT MORE FRUITS AND VEGETABLES         Depression Screen    11/04/2022   10:07 AM 10/30/2022    9:31 AM 10/21/2022   10:31 AM 07/30/2022   10:26 AM 05/04/2022    9:40 AM 05/01/2022    8:52 AM 10/30/2021    9:44 AM  PHQ 2/9 Scores  PHQ - 2 Score 0 0 0 0 0 0 0  PHQ- 9 Score 0 0 0 0 0 0 0    Fall Risk    11/04/2022   10:09 AM 10/30/2022    9:31 AM 10/21/2022   10:31 AM 07/30/2022   10:26 AM 05/04/2022    9:40 AM  Fall Risk   Falls in the past year? 0 0 0 0 0  Number falls in past yr: 0 0 0 0 0  Injury with Fall? 0 0 0 0 0  Risk for fall due to : No Fall Risks No Fall Risks No Fall Risks No Fall Risks No Fall Risks  Follow up Falls prevention discussed;Falls evaluation completed Falls evaluation completed Falls evaluation completed Falls evaluation completed Falls evaluation completed    FALL RISK PREVENTION PERTAINING TO THE HOME:  Any stairs in or around the home? No  If so, are there any without handrails? No  Home free  of loose throw rugs in walkways, pet beds, electrical cords, etc?  Yes  Adequate lighting in your home to reduce risk of falls? Yes   ASSISTIVE DEVICES UTILIZED TO PREVENT FALLS:  Life alert? No  Use of a cane, walker or w/c? No  Grab bars in the bathroom? Yes  Shower chair or bench in shower? Yes  Elevated toilet seat or a handicapped toilet? No    Cognitive Function:        11/04/2022   10:13 AM 10/10/2018    9:39 AM 10/04/2017    9:16 AM 09/29/2016    9:40 AM  6CIT Screen  What Year? 0 points 0 points 0 points 0 points  What month? 0 points 0 points 0 points 0 points  What time? 0 points 0 points 0 points 0 points  Count back from 20 0 points 0 points 0 points 0 points  Months in reverse 0 points 0 points 0 points 0 points  Repeat phrase 0 points 0 points 0 points 0 points  Total Score 0 points 0 points 0 points 0 points    Immunizations Immunization History  Administered Date(s) Administered   Fluad Quad(high Dose 65+) 04/18/2019, 04/29/2020, 04/23/2022   Influenza Split 04/01/2015   Influenza Whole 03/25/2006, 05/25/2011   Influenza, High Dose Seasonal PF 03/31/2017, 04/22/2018, 03/19/2021   Influenza,inj,Quad PF,6+ Mos 03/01/2013, 03/14/2014, 03/24/2016   PFIZER(Purple Top)SARS-COV-2 Vaccination 06/29/2019, 07/20/2019, 04/03/2020   Pfizer Covid-19 Vaccine Bivalent Booster 99yrs & up 03/11/2021   Pneumococcal Conjugate-13 09/17/2014   Pneumococcal Polysaccharide-23 09/23/2009   Td 02/04/2004, 03/14/2014   Td (Adult), 2 Lf Tetanus Toxid, Preservative Free 02/04/2004, 03/14/2014   Zoster Recombinat (Shingrix) 01/09/2019, 05/18/2019   Zoster, Live 07/17/2010    TDAP status: Up to date  Flu Vaccine status: Up to date  Pneumococcal vaccine status: Up to date  Covid-19 vaccine status: Completed vaccines  Qualifies for Shingles Vaccine? Yes   Zostavax completed Yes   Shingrix Completed?: Yes  Screening Tests Health Maintenance  Topic Date Due   COVID-19 Vaccine (5 - 2023-24 season) 02/13/2022   MAMMOGRAM  12/26/2022    INFLUENZA VACCINE  01/14/2023   Medicare Annual Wellness (AWV)  11/04/2023   DTaP/Tdap/Td (5 - Tdap) 03/14/2024   Pneumonia Vaccine 32+ Years old  Completed   DEXA SCAN  Completed   Zoster Vaccines- Shingrix  Completed   HPV VACCINES  Aged Out    Health Maintenance  Health Maintenance Due  Topic Date Due   COVID-19 Vaccine (5 - 2023-24 season) 02/13/2022    Colorectal cancer screening: No longer required.   Mammogram status: Ordered 10/30/22. Pt provided with contact info and advised to call to schedule appt.   Bone Density status: Completed 12/19/20. Results reflect: Bone density results: OSTEOPENIA. Repeat every 5 years.  Lung Cancer Screening: (Low Dose CT Chest recommended if Age 53-80 years, 30 pack-year currently smoking OR have quit w/in 15years.) does not qualify.    Additional Screening:  Hepatitis C Screening: does not qualify; Completed no  Vision Screening: Recommended annual ophthalmology exams for early detection of glaucoma and other disorders of the eye. Is the patient up to date with their annual eye exam?  Yes  Who is the provider or what is the name of the office in which the patient attends annual eye exams? [facility] If pt is not established with a provider, would they like to be referred to a provider to establish care? No .   Dental Screening: Recommended annual  dental exams for proper oral hygiene  Community Resource Referral / Chronic Care Management: CRR required this visit?  No   CCM required this visit?  No      Plan:     I have personally reviewed and noted the following in the patient's chart:   Medical and social history Use of alcohol, tobacco or illicit drugs  Current medications and supplements including opioid prescriptions. Patient is not currently taking opioid prescriptions. Functional ability and status Nutritional status Physical activity Advanced directives List of other physicians Hospitalizations, surgeries, and ER  visits in previous 12 months Vitals Screenings to include cognitive, depression, and falls Referrals and appointments  In addition, I have reviewed and discussed with patient certain preventive protocols, quality metrics, and best practice recommendations. A written personalized care plan for preventive services as well as general preventive health recommendations were provided to patient.     Hal Hope, LPN   09/21/8117   Nurse Notes: none

## 2022-11-19 ENCOUNTER — Telehealth: Payer: Self-pay | Admitting: Gastroenterology

## 2022-11-19 NOTE — Telephone Encounter (Signed)
Pt left message would like a call back having severe acid reflux during the day

## 2022-11-25 NOTE — Telephone Encounter (Signed)
Pt has a f/u appt with Inetta Fermo in a few weeks... She is taking Pantoprazole 40 QD... Pt would like to know if there is any advice you can give in the meantime as she is experiencing an increase in GERD Sx during the day, more than before

## 2022-11-27 NOTE — Telephone Encounter (Signed)
Pt is aware as instructed and expressed understanding 

## 2022-12-23 ENCOUNTER — Encounter: Payer: Self-pay | Admitting: Physician Assistant

## 2022-12-23 ENCOUNTER — Ambulatory Visit: Payer: Medicare Other | Admitting: Physician Assistant

## 2022-12-23 VITALS — BP 143/74 | HR 75 | Temp 98.4°F | Ht 61.0 in | Wt 130.0 lb

## 2022-12-23 DIAGNOSIS — R053 Chronic cough: Secondary | ICD-10-CM | POA: Diagnosis not present

## 2022-12-23 DIAGNOSIS — K2100 Gastro-esophageal reflux disease with esophagitis, without bleeding: Secondary | ICD-10-CM

## 2022-12-23 DIAGNOSIS — K449 Diaphragmatic hernia without obstruction or gangrene: Secondary | ICD-10-CM

## 2022-12-23 NOTE — Progress Notes (Signed)
**Note Rebecca-Identified via Obfuscation** Celso Amy, PA-C [address]  [address]  [place], [place] [address]  Main: [phone]  Fax: [phone]   Primary Care Physician: Reubin Milan, MD  Primary Gastroenterologist:  Celso Amy, PA-C / Dr. Midge Minium    CC: Follow-up GERD  HPI: Rebecca Gutierrez is a 82 y.o. female for follow-up of acid reflux.  She last saw Dr. Servando Snare in 2023 to follow-up with chronic cough attributed to acid reflux.  She underwent Bravo test 10/2021 which showed many episodes of acid reflux.  EGD 10/21/2021 showed a large 10 cm hiatal hernia, normal stomach and normal duodenum.  Esophageal biopsies were negative for Barrett's esophagus.  Mild chronic esophagitis.  Of note, she is taking ramipril (ACE inhibitor) for over 8 years, which could contribute to chronic cough.  She has been on pantoprazole 40 mg once daily for acid reflux for over a year.  She takes pantoprazole 1 hour before bedtime.  It was helping, however she has had worsening cough for the past few months.  She has increased coughing when she talks.  Has noticed some voice hoarseness.  Increased cough when she lays down.  Takes Tums as needed which has helped.  She is having less mucus production.  Denies shortness of breath, respiratory problems, or history of allergies.  Labs 10/30/2022 showed normal CBC, CMP, and TSH.  GFR 68.  Hemoglobin 13.7.  Current Outpatient Medications  Medication Sig Dispense Refill   aspirin EC 81 MG tablet Take 81 mg by mouth at bedtime.      Cholecalciferol 50 MCG (2000 UT) TABS Take 1 capsule by mouth in the morning.     estradiol (ESTRACE VAGINAL) 0.1 MG/GM vaginal cream Apply 0.5mg  (pea-sized amount)  just inside the vaginal introitus with a finger-tip on Monday, Wednesday and Friday nights. 30 g 0   hydrochlorothiazide (HYDRODIURIL) 25 MG tablet Take 1 tablet (25 mg total) by mouth daily. 100 tablet 1   metoprolol tartrate (LOPRESSOR) 25 MG tablet TAKE 1 AND 1/2 TABLETS BY MOUTH  TWICE  DAILY 300 tablet 1   nitrofurantoin (MACRODANTIN) 50 MG capsule Take 50 mg by mouth at bedtime.     pantoprazole (PROTONIX) 40 MG tablet Take 1 tablet (40 mg total) by mouth at bedtime. 90 tablet 3   ramipril (ALTACE) 10 MG capsule Take 1 capsule (10 mg total) by mouth daily. 100 capsule 1   simvastatin (ZOCOR) 10 MG tablet Take 1 tablet (10 mg total) by mouth daily. 90 tablet 3   No current facility-administered medications for this visit.    Allergies as of 12/23/2022 - Review Complete 12/23/2022  Allergen Reaction Noted   Ciprofloxacin Other (See Comments) 05/27/2018   Levaquin [levofloxacin] Other (See Comments) 05/11/2017   Potassium clavulanate [clavulanic acid] Diarrhea 10/21/2022   [name] Diarrhea 06/24/2015   [name] Palpitations and Rash 06/02/2007    Past Medical History:  Diagnosis Date   Abnormal ECG 10/28/2017   Anxiety    Bradycardia 11/10/2017   Cancer (HCC)    skin ca   Cystocele 09/13/2013   small recurrent, less than before 09/13/13 JVF    Gilbert's syndrome    Hearing loss 1975   History of recurrent urinary tract infection 03/24/2016   Hyperlipidemia 2000   Hypertension 2000   Recurrent UTI 03/10/2015    Past Surgical History:  Procedure Laterality Date   ANTERIOR AND POSTERIOR REPAIR N/A 05/02/2013   Procedure: ANTERIOR (CYSTOCELE) AND POSTERIOR REPAIR (RECTOCELE);  Surgeon: [name],  MD;  Location: [facility];  Service: Gynecology;  Laterality: N/A;   BRAVO PH STUDY N/A 10/21/2021   Procedure: BRAVO PH STUDY;  Surgeon: Midge Minium, MD;  Location: [facility] ENDOSCOPY;  Service: Endoscopy;  Laterality: N/A;   CATARACT EXTRACTION, BILATERAL     CHOLECYSTECTOMY  10/15/2020   COLONOSCOPY  08/28/2011   Procedure: COLONOSCOPY;  Surgeon: Malissa Hippo, MD;  Location: [facility];  Service: Endoscopy;  Laterality: N/A;  930   ESOPHAGOGASTRODUODENOSCOPY N/A 10/21/2021   Procedure: ESOPHAGOGASTRODUODENOSCOPY (EGD);  Surgeon:  Midge Minium, MD;  Location: [facility] ENDOSCOPY;  Service: Endoscopy;  Laterality: N/A;   EYE SURGERY Bilateral 02/13/2011   other 2013   MOHS SURGERY  06/2016   Removed skin cancer from Rt shin   TUBAL LIGATION     VAGINAL HYSTERECTOMY N/A 05/02/2013   Procedure: HYSTERECTOMY VAGINAL;  Surgeon: [name], MD;  Location: [facility];  Service: Gynecology;  Laterality: N/A;    Review of Systems:    All systems reviewed and negative except where noted in HPI.   Physical Examination:   BP (!) 143/74   Pulse 75   Temp 98.4 F (36.9 C)   Ht 5\' 1"  (1.549 m)   Wt 130 lb (59 kg)   BMI 24.56 kg/m   General: Well-nourished, well-developed in no acute distress.  Eyes: No icterus. Conjunctivae pink. Mouth: Oropharyngeal mucosa moist and pink , no lesions erythema or exudate. Lungs: Clear to auscultation bilaterally. Non-labored. Heart: Regular rate and rhythm, no murmurs rubs or gallops.  Abdomen: Bowel sounds are normal; Abdomen is Soft; No hepatosplenomegaly, masses or hernias;  No Abdominal Tenderness; No guarding or rebound tenderness. Extremities: No lower extremity edema. No clubbing or deformities. Neuro: Alert and oriented x 3.  Grossly intact. Skin: Warm and dry, no jaundice.   Psych: Alert and cooperative, normal mood and affect.   Imaging Studies: No results found.  Assessment and Plan:   Rebecca Gutierrez is a 82 y.o. y/o female returns for follow-up of chronic cough and chronic acid reflux.  EGD last year showed large 10 cm hiatal hernia.  Bravo test showed several episodes of acid reflux.  I am increasing her PPI dose.  Once daily pantoprazole is not controlling her symptoms.  She had a lot of active coughing episodes during her office visit today.  Otherwise well-appearing.  Lungs clear on exam.  1.  GERD  Increase pantoprazole to 40 mg 1 tablet twice daily, take 30 minutes before breakfast and 1 hour before bedtime.  Add OTC Pepcid 20 Mg twice daily and Tums as  needed.  Recommend Lifestyle Modifications to prevent Acid Reflux.  Rec. Avoid coffee, sodas, peppermint, citrus fruits, and spicey foods.  Avoid eating 2-3 hours before bedtime.   2.  Large hiatal hernia  We discussed risks and benefits of fundoplication surgery to repair hiatal hernia.  Patient declined referral for surgery evaluation at this time.  3.  Chronic cough  Trial of high-dose PPI for 2 or 3 months.  If cough persist or she develops shortness of breath, then I recommend pulmonology evaluation.  I advised patient to talk with her PCP about switching hypertension medication.  Possibly stopping lisinopril ACE inhibitor which could contribute to chronic cough.   Celso Amy, PA-C  Follow up in 4 to 6 weeks with TG.

## 2022-12-28 ENCOUNTER — Ambulatory Visit
Admission: RE | Admit: 2022-12-28 | Discharge: 2022-12-28 | Disposition: A | Discharge status: Home / Self Care | Payer: Medicare Other | Source: Ambulatory Visit | Attending: Internal Medicine | Admitting: Internal Medicine

## 2022-12-28 DIAGNOSIS — Z1231 Encounter for screening mammogram for malignant neoplasm of breast: Secondary | ICD-10-CM | POA: Insufficient documentation

## 2022-12-31 ENCOUNTER — Encounter: Payer: Self-pay | Admitting: Physician Assistant

## 2022-12-31 ENCOUNTER — Ambulatory Visit (INDEPENDENT_AMBULATORY_CARE_PROVIDER_SITE_OTHER): Payer: Medicare Other | Admitting: Physician Assistant

## 2022-12-31 ENCOUNTER — Ambulatory Visit: Payer: Self-pay | Admitting: *Deleted

## 2022-12-31 VITALS — BP 124/76 | HR 95 | Temp 98.1°F | Ht 62.0 in | Wt 129.0 lb

## 2022-12-31 DIAGNOSIS — L282 Other prurigo: Secondary | ICD-10-CM | POA: Diagnosis not present

## 2022-12-31 MED ORDER — HYDROXYZINE HCL 25 MG PO TABS
25.0000 mg | ORAL_TABLET | Freq: Three times a day (TID) | ORAL | 0 refills | Status: DC | PRN
Start: 2022-12-31 — End: 2023-01-08

## 2022-12-31 NOTE — Telephone Encounter (Signed)
Reason for Disposition  SEVERE itching (i.e., interferes with sleep, normal activities or school)  Answer Assessment - Initial Assessment Questions 1. APPEARANCE of RASH: "Describe the rash." (e.g., spots, blisters, raised areas, skin peeling, scaly)     I thought this rash was jiggers.    It's not going away.   They can be in your hair too?    Sometimes I itch but not much.    At night I itch bad. This morning I had a big rash when I got up.    It's in my groin area and legs and warmer parts of my body. I've had jigger bites before but these are not going away.   I don't know how I got them.    Sat. I went out and picked up a hanging basket and put it on my porch out of the heat and sun.   I was out there maybe 10 seconds outside.   At night is when they itch really bad.    2. SIZE: "How big are the spots?" (e.g., tip of pen, eraser, coin; inches, centimeters)     It's like a little red spot.   If I wash and put cortisone on it that helps.    3. LOCATION: "Where is the rash located?"     Groin and both legs on the side of thighs by my groin.   4. COLOR: "What color is the rash?" (Note: It is difficult to assess rash color in people with darker-colored skin. When this situation occurs, simply ask the caller to describe what they see.)     It's a little red dot. 5. ONSET: "When did the rash begin?"     Sat. 6. FEVER: "Do you have a fever?" If Yes, ask: "What is your temperature, how was it measured, and when did it start?"     Not asked 7. ITCHING: "Does the rash itch?" If Yes, ask: "How bad is the itch?" (Scale 1-10; or mild, moderate, severe)     Yes especially at night 8. CAUSE: "What do you think is causing the rash?"     Maybe jiggers but I don't know.  I have some on my back, waist and sides too.  9. MEDICINE FACTORS: "Have you started any new medicines within the last 2 weeks?" (e.g., antibiotics)      No     10. OTHER SYMPTOMS: "Do you have any other symptoms?" (e.g., dizziness,  headache, sore throat, joint pain)       No 11. PREGNANCY: "Is there any chance you are pregnant?" "When was your last menstrual period?"       N/A due to age  Protocols used: Rash or Redness - [facility]

## 2022-12-31 NOTE — Telephone Encounter (Signed)
  Chief Complaint: itchy rash that is spreading Symptoms: little red dots Frequency: Since Sat. Pertinent Negatives: Patient denies knowing what it is.  Maybe jiggers? Disposition: [] ED /[] Urgent Care (no appt availability in office) / [x] Appointment(In office/virtual)/ []  [place] [facility]/ [] Home Care/ [] Refused Recommended Disposition /[] [place] [facility]/ []  Follow-up with PCP Additional Notes: Appt made with Tillie Fantasia, PA for this morning at 11:20.    No appts available with Dr. Judithann Graves until the end of July.

## 2022-12-31 NOTE — Progress Notes (Signed)
Date:  12/31/2022   Name:  Rebecca Gutierrez   DOB:  26-Dec-1940   MRN:  [id number]   Chief Complaint: Rash  Rash This is a new problem. Episode onset: X2 weeks. The problem is unchanged. The affected locations include the groin, abdomen, back and right arm. The rash is characterized by itchiness and redness. She was exposed to an insect bite/sting (chiggers). Pertinent negatives include no fatigue or fever. Past treatments include anti-itch cream. The treatment provided mild relief.   Rebecca Gutierrez is a pleasant 82 year old female new to me today, typically seen by my colleague Dr. Bari Edward, MD, here for evaluation of a diffuse pruritic pink macular rash which appears to be spreading since onset Saturday, unsure if caused by insect (chiggers) or other etiology. She tells me she was moving some plants around over the weekend, unsure if related.  Primarily seems to affect the proximal lower extremities and trunk, itching worse at night, topical hydrocortisone helps.  She has not seen any insects on her skin, and has washed all of the linens and checked the bed for bedbugs.  She maintains good hygiene.  She has not started any new medications, supplements, soaps, detergents, etc. in the last 2 weeks.    Medication list has been reviewed and updated.  Current Meds  Medication Sig   aspirin EC 81 MG tablet Take 81 mg by mouth at bedtime.    Cholecalciferol 50 MCG (2000 UT) TABS Take 1 capsule by mouth in the morning.   estradiol (ESTRACE VAGINAL) 0.1 MG/GM vaginal cream Apply 0.5mg  (pea-sized amount)  just inside the vaginal introitus with a finger-tip on Monday, Wednesday and Friday nights.   hydrochlorothiazide (HYDRODIURIL) 25 MG tablet Take 1 tablet (25 mg total) by mouth daily.   hydrOXYzine (ATARAX) 25 MG tablet Take 1 tablet (25 mg total) by mouth 3 (three) times daily as needed. May cause drowsiness   metoprolol tartrate (LOPRESSOR) 25 MG tablet TAKE 1 AND 1/2 TABLETS BY MOUTH  TWICE  DAILY   pantoprazole (PROTONIX) 40 MG tablet Take 1 tablet (40 mg total) by mouth at bedtime.   ramipril (ALTACE) 10 MG capsule Take 1 capsule (10 mg total) by mouth daily.   simvastatin (ZOCOR) 10 MG tablet Take 1 tablet (10 mg total) by mouth daily.   [DISCONTINUED] nitrofurantoin (MACRODANTIN) 50 MG capsule Take 50 mg by mouth at bedtime.     Review of Systems  Constitutional:  Negative for fatigue and fever.  Musculoskeletal:  Negative for arthralgias.  Skin:  Positive for rash.    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   Hearing loss 06/02/2007   Essential hypertension, benign 06/02/2007    Allergies  Allergen Reactions   Ciprofloxacin Other (See Comments)    Tendinitis    Levaquin [Levofloxacin] Other (See Comments)    insomnia   Potassium Clavulanate [Clavulanic Acid] Diarrhea   [name] Diarrhea   [name] Palpitations and Rash    Last reaction was in 1969    Immunization History  Administered Date(s) Administered   Fluad Quad(high Dose 65+) 04/18/2019, 04/29/2020, 04/23/2022   Influenza Split 04/01/2015   Influenza Whole 03/25/2006,  05/25/2011   Influenza, High Dose Seasonal PF 03/31/2017, 04/22/2018, 03/19/2021   Influenza,inj,Quad PF,6+ Mos 03/01/2013, 03/14/2014, 03/24/2016   PFIZER(Purple Top)SARS-COV-2 Vaccination 06/29/2019, 07/20/2019, 04/03/2020   Pfizer Covid-19 Vaccine Bivalent Booster 19yrs & up 03/11/2021   Pneumococcal Conjugate-13 09/17/2014   Pneumococcal Polysaccharide-23 09/23/2009   Td 02/04/2004, 03/14/2014   Td (Adult), 2 Lf Tetanus Toxid,  Preservative Free 02/04/2004, 03/14/2014   Zoster Recombinant(Shingrix) 01/09/2019, 05/18/2019   Zoster, Live 07/17/2010    Past Surgical History:  Procedure Laterality Date   ANTERIOR AND POSTERIOR REPAIR N/A 05/02/2013   Procedure: ANTERIOR (CYSTOCELE) AND POSTERIOR REPAIR (RECTOCELE);  Surgeon: [name], MD;  Location: [facility];  Service: Gynecology;  Laterality: N/A;   BRAVO PH STUDY N/A 10/21/2021   Procedure: BRAVO PH STUDY;  Surgeon: Midge Minium, MD;  Location: [facility] ENDOSCOPY;  Service: Endoscopy;  Laterality: N/A;   CATARACT EXTRACTION, BILATERAL     CHOLECYSTECTOMY  10/15/2020   COLONOSCOPY  08/28/2011   Procedure: COLONOSCOPY;  Surgeon: Malissa Hippo, MD;  Location: [facility];  Service: Endoscopy;  Laterality: N/A;  930   ESOPHAGOGASTRODUODENOSCOPY N/A 10/21/2021   Procedure: ESOPHAGOGASTRODUODENOSCOPY (EGD);  Surgeon: Midge Minium, MD;  Location: [facility] ENDOSCOPY;  Service: Endoscopy;  Laterality: N/A;   EYE SURGERY Bilateral 02/13/2011   other 2013   MOHS SURGERY  06/2016   Removed skin cancer from Rt shin   TUBAL LIGATION     VAGINAL HYSTERECTOMY N/A 05/02/2013   Procedure: HYSTERECTOMY VAGINAL;  Surgeon: [name], MD;  Location: [facility];  Service: Gynecology;  Laterality: N/A;    Social History   Tobacco Use   Smoking status: Never   Smokeless tobacco: Never  Vaping Use   Vaping status: Never Used  Substance Use Topics   Alcohol use: Yes    Alcohol/week: 1.0 standard drink of alcohol    Types: 1 Standard drinks or equivalent per week   Drug use: Never    Family History  Problem Relation Age of Onset   Hypertension Mother    Cancer Mother        stomach   Healthy Father    Healthy Brother    Colon cancer Neg Hx    Prostate cancer Neg Hx    Kidney cancer Neg Hx    Bladder Cancer Neg Hx    Breast cancer Neg Hx         12/31/2022   11:18 AM 10/30/2022    9:32 AM 10/21/2022   10:31 AM 07/30/2022   10:26 AM  GAD 7 : Generalized Anxiety Score   Nervous, Anxious, on Edge 0 0 0 0  Control/stop worrying 0 0 0 0  Worry too much - different things 0 0 0 0  Trouble relaxing 0 0 0 0  Restless 0 0 0 0  Easily annoyed or irritable 0 0 0 0  Afraid - awful might happen 0 0 0 0  Total GAD 7 Score 0 0 0 0  Anxiety Difficulty Not difficult at all Not difficult at all Not difficult at all Not difficult at all       12/31/2022   11:18 AM 11/04/2022   10:07 AM 10/30/2022    9:31 AM  Depression screen PHQ 2/9  Decreased Interest 0 0 0  Down, Depressed, Hopeless 0 0 0  PHQ - 2 Score 0 0 0  Altered sleeping 1 0 0  Tired, decreased energy 0 0 0  Change in appetite 0 0 0  Feeling bad or  failure about yourself  0 0 0  Trouble concentrating 0 0 0  Moving slowly or fidgety/restless 0 0 0  Suicidal thoughts 0 0 0  PHQ-9 Score 1 0 0  Difficult doing work/chores Not difficult at all Not difficult at all Not difficult at all    BP Readings from Last 3 Encounters:  12/31/22 124/76  12/23/22 (!) 143/74  10/30/22 128/74    Wt Readings from Last 3 Encounters:  12/31/22 129 lb (58.5 kg)  12/23/22 130 lb (59 kg)  11/04/22 129 lb (58.5 kg)    BP 124/76   Pulse 95   Temp 98.1 F (36.7 C) (Oral)   Ht 5\' 2"  (1.575 m)   Wt 129 lb (58.5 kg)   SpO2 96%   BMI 23.59 kg/m   Physical Exam Vitals and nursing note reviewed.  Constitutional:      Appearance: Normal appearance.  Cardiovascular:     Rate and Rhythm: Normal rate.  Pulmonary:     Effort: Pulmonary effort is normal.  Abdominal:     General: There is no distension.  Musculoskeletal:        General: Normal range of motion.  Skin:    General: Skin is warm and dry.     Comments: Diffuse pink macular blanchable rash primarily involving proximal medial legs and lower abdomen/flank but with scattered macules of the arms, upper back, chest.  No specific pattern.  Nontender.  Neurological:     Mental Status: She is alert and oriented to person, place, and time.     Gait: Gait is  intact.  Psychiatric:        Mood and Affect: Mood and affect normal.     Recent Labs     Component Value Date/Time   NA 138 10/30/2022 1014   NA 142 10/13/2011 1515   K 4.2 10/30/2022 1014   K 3.8 10/13/2011 1515   CL 101 10/30/2022 1014   CL 103 10/13/2011 1515   CO2 24 10/30/2022 1014   CO2 33 (H) 10/13/2011 1515   GLUCOSE 109 (H) 10/30/2022 1014   GLUCOSE 91 09/25/2015 0822   GLUCOSE 113 (H) 10/13/2011 1515   BUN 18 10/30/2022 1014   BUN 16 10/13/2011 1515   CREATININE 0.86 10/30/2022 1014   CREATININE 0.73 09/25/2015 0822   CALCIUM 9.6 10/30/2022 1014   CALCIUM 9.4 10/13/2011 1515   PROT 7.6 10/30/2022 1014   PROT 7.5 10/13/2011 1515   ALBUMIN 4.1 10/30/2022 1014   ALBUMIN 3.9 10/13/2011 1515   AST 35 10/30/2022 1014   AST 21 10/13/2011 1515   ALT 32 10/30/2022 1014   ALT 28 10/13/2011 1515   ALKPHOS 73 10/30/2022 1014   ALKPHOS 74 10/13/2011 1515   BILITOT 1.4 (H) 10/30/2022 1014   BILITOT 1.2 (H) 10/13/2011 1515   GFRNONAA 78 07/22/2020 1153   GFRNONAA 85 03/29/2015 0912   GFRAA 90 07/22/2020 1153   GFRAA >89 03/29/2015 0912    Lab Results  Component Value Date   WBC 4.9 10/30/2022   HGB 13.7 10/30/2022   HCT 42.3 10/30/2022   MCV 89 10/30/2022   PLT 161 10/30/2022   Lab Results  Component Value Date   HGBA1C 5.8 (H) 10/30/2022   Lab Results  Component Value Date   CHOL 163 10/30/2022   HDL 57 10/30/2022   LDLCALC 89 10/30/2022   TRIG 94 10/30/2022   CHOLHDL 2.9 10/30/2022   Lab Results  Component Value Date   TSH 2.980 10/30/2022  Assessment and Plan:  1. Pruritic rash Etiology not clear at this time.  I am not convinced these are insect bites, but they could be.  Patient reassured that even if they are chigger bites, no treatment necessary aside from symptom control.  Sending low-dose hydroxyzine which should help with the itching and sleep.  Patient advised this medication may cause drowsiness so she should take with caution.   Continue topical corticosteroids.  Follow-up if not improving  - hydrOXYzine (ATARAX) 25 MG tablet; Take 1 tablet (25 mg total) by mouth 3 (three) times daily as needed. May cause drowsiness  Dispense: 30 tablet; Refill: 0   Return if symptoms worsen or fail to improve.    Alvester Morin, PA-C, DMSc, Nutritionist [facility] [phone]

## 2023-01-08 ENCOUNTER — Ambulatory Visit: Payer: Medicare Other | Admitting: Urology

## 2023-01-08 ENCOUNTER — Encounter: Payer: Self-pay | Admitting: Urology

## 2023-01-08 VITALS — BP 164/76 | HR 93 | Wt 127.0 lb

## 2023-01-08 DIAGNOSIS — R8281 Pyuria: Secondary | ICD-10-CM

## 2023-01-08 DIAGNOSIS — R82998 Other abnormal findings in urine: Secondary | ICD-10-CM

## 2023-01-08 DIAGNOSIS — R8271 Bacteriuria: Secondary | ICD-10-CM | POA: Diagnosis not present

## 2023-01-08 DIAGNOSIS — R3129 Other microscopic hematuria: Secondary | ICD-10-CM

## 2023-01-08 DIAGNOSIS — R3989 Other symptoms and signs involving the genitourinary system: Secondary | ICD-10-CM

## 2023-01-08 LAB — URINALYSIS, COMPLETE
Bilirubin, UA: NEGATIVE
Glucose, UA: NEGATIVE
Ketones, UA: NEGATIVE
Nitrite, UA: POSITIVE — AB
Protein,UA: NEGATIVE
Specific Gravity, UA: 1.015 (ref 1.005–1.030)
Urobilinogen, Ur: 0.2 mg/dL (ref 0.2–1.0)
pH, UA: 6 (ref 5.0–7.5)

## 2023-01-08 LAB — MICROSCOPIC EXAMINATION: WBC, UA: >30 /hpf — AB (ref 0–5)

## 2023-01-08 MED ORDER — CEFUROXIME AXETIL 500 MG PO TABS
500.0000 mg | ORAL_TABLET | Freq: Two times a day (BID) | ORAL | 0 refills | Status: DC
Start: 2023-01-08 — End: 2023-02-11

## 2023-01-08 NOTE — Progress Notes (Signed)
01/08/2023 7:16 PM   Rebecca Gutierrez [birth year] [id number]  Referring provider: Reubin Milan, MD [address] [place],  [place] [address]  Urological history: 1. rUTI's -contributing factors of age, GSM and incomplete bladder emptying -cysto (2019) - NED -documented urine cultures over the last year  Oct 26, 2022 - Klebsiella pneumoniae  Oct 21, 2022 - Klebsiella pneumoniae  -nitrofurantoin 100 mg daily  2. Cystocele -contributing factors age age, vaginal births, hysterectomy and GSM -previously manage with a pessary  3. Parapelvic cysts -RUS (2021) - bilaterally   4. Left renal cyst -RUS (2021) - 1.4 cm left kidney  5. Urinary retention -post operatively (2022)     Chief Complaint  Patient presents with   Cystitis    HPI: Rebecca Gutierrez is a 82 y.o. female who presents today for UTI.  Previous records reviewed.   On Sunday night, she started to experience nocturia x 6 which is not typical for her.  As the weekend progressed, she also started to experience dysuria.    Patient denies any modifying or aggravating factors.  Patient denies any recent UTI's, gross hematuria or suprapubic/flank pain.  Patient denies any fevers, chills, nausea or vomiting.   UA yellow cloudy, specific remedy 1.015, 1+ heme, nitrate positive, pH 6.0, 1+ leuks, greater than 30 RBCs, 3-10 RBCs, 0-10 epithelial cells and many bacteria.  PMH: Past Medical History:  Diagnosis Date   Abnormal ECG 10/28/2017   Anxiety    Bradycardia 11/10/2017   Cancer (HCC)    skin ca   Cystocele 09/13/2013   small recurrent, less than before 09/13/13 JVF    Gilbert's syndrome    Hearing loss 1975   History of recurrent urinary tract infection 03/24/2016   Hyperlipidemia 2000   Hypertension 2000   Recurrent UTI 03/10/2015    Surgical History: Past Surgical History:  Procedure Laterality Date   ANTERIOR AND POSTERIOR REPAIR N/A 05/02/2013   Procedure: ANTERIOR (CYSTOCELE)  AND POSTERIOR REPAIR (RECTOCELE);  Surgeon: [name], MD;  Location: [facility];  Service: Gynecology;  Laterality: N/A;   BRAVO PH STUDY N/A 10/21/2021   Procedure: BRAVO PH STUDY;  Surgeon: Midge Minium, MD;  Location: [facility] ENDOSCOPY;  Service: Endoscopy;  Laterality: N/A;   CATARACT EXTRACTION, BILATERAL     CHOLECYSTECTOMY  10/15/2020   COLONOSCOPY  08/28/2011   Procedure: COLONOSCOPY;  Surgeon: Malissa Hippo, MD;  Location[facility];  Service: Endoscopy;  Laterality: N/A;  930   ESOPHAGOGASTRODUODENOSCOPY N/A 10/21/2021   Procedure: ESOPHAGOGASTRODUODENOSCOPY (EGD);  Surgeon: Midge Minium, MD;  Location: [facility] ENDOSCOPY;  Service: Endoscopy;  Laterality: N/A;   EYE SURGERY Bilateral 02/13/2011   other 2013   MOHS SURGERY  06/2016   Removed skin cancer from Rt shin   TUBAL LIGATION     VAGINAL HYSTERECTOMY N/A 05/02/2013   Procedure: HYSTERECTOMY VAGINAL;  Surgeon: [name], MD;  Location: [facility];  Service: Gynecology;  Laterality: N/A;    Home Medications:  Allergies as of 01/08/2023       Reactions   Ciprofloxacin Other (See Comments)   Tendinitis    Levaquin [levofloxacin] Other (See Comments)   insomnia   Potassium Clavulanate [clavulanic Acid] Diarrhea   [name] Diarrhea   [name] Palpitations, Rash   Last reaction was in 1969        Medication List        Accurate as of January 08, 2023  7:16 PM. If you have any  questions, ask your nurse or doctor.          STOP taking these medications    hydrOXYzine 25 MG tablet Commonly known as: ATARAX Stopped by: Michiel Cowboy       TAKE these medications    aspirin EC 81 MG tablet Take 81 mg by mouth at bedtime.   cefUROXime 500 MG tablet Commonly known as: CEFTIN Take 1 tablet (500 mg total) by mouth 2 (two) times daily with a meal. Started by: Michiel Cowboy   Cholecalciferol 50 MCG (2000 UT) Tabs Take 1 capsule by mouth in the morning.   estradiol 0.1  MG/GM vaginal cream Commonly known as: ESTRACE VAGINAL Apply 0.5mg  (pea-sized amount)  just inside the vaginal introitus with a finger-tip on Monday, Wednesday and Friday nights.   hydrochlorothiazide 25 MG tablet Commonly known as: HYDRODIURIL Take 1 tablet (25 mg total) by mouth daily.   metoprolol tartrate 25 MG tablet Commonly known as: LOPRESSOR TAKE 1 AND 1/2 TABLETS BY MOUTH  TWICE DAILY   pantoprazole 40 MG tablet Commonly known as: PROTONIX Take 1 tablet (40 mg total) by mouth at bedtime.   ramipril 10 MG capsule Commonly known as: ALTACE Take 1 capsule (10 mg total) by mouth daily.   simvastatin 10 MG tablet Commonly known as: ZOCOR Take 1 tablet (10 mg total) by mouth daily.        Allergies:  Allergies  Allergen Reactions   Ciprofloxacin Other (See Comments)    Tendinitis    Levaquin [Levofloxacin] Other (See Comments)    insomnia   Potassium Clavulanate [Clavulanic Acid] Diarrhea   [name] Diarrhea   [name] Palpitations and Rash    Last reaction was in 1969    Family History: Family History  Problem Relation Age of Onset   Hypertension Mother    Cancer Mother        stomach   Healthy Father    Healthy Brother    Colon cancer Neg Hx    Prostate cancer Neg Hx    Kidney cancer Neg Hx    Bladder Cancer Neg Hx    Breast cancer Neg Hx     Social History:  reports that she has never smoked. She has never used smokeless tobacco. She reports current alcohol use of about 1.0 standard drink of alcohol per week. She reports that she does not use drugs.  ROS: Pertinent ROS in HPI  Physical Exam: BP (!) 164/76   Pulse 93   Wt 127 lb (57.6 kg)   BMI 23.23 kg/m   Constitutional:  Well nourished. Alert and oriented, No acute distress. HEENT: [place] AT, moist mucus membranes.  Trachea midline Cardiovascular: No clubbing, cyanosis, or edema. Respiratory: Normal respiratory effort, no increased work of  breathing. Neurologic: Grossly intact, no focal deficits, moving all 4 extremities. Psychiatric: Normal mood and affect.     Laboratory Data: Lab Results  Component Value Date   WBC 4.9 10/30/2022   HGB 13.7 10/30/2022   HCT 42.3 10/30/2022   MCV 89 10/30/2022   PLT 161 10/30/2022   Lab Results  Component Value Date   CREATININE 0.86 10/30/2022   Lab Results  Component Value Date   HGBA1C 5.8 (H) 10/30/2022   Lab Results  Component Value Date   TSH 2.980 10/30/2022      Component Value Date/Time   CHOL 163 10/30/2022 1014   HDL 57 10/30/2022 1014   CHOLHDL 2.9 10/30/2022 1014   CHOLHDL 2.1 09/25/2015 1610  VLDL 13 09/25/2015 0822   LDLCALC 89 10/30/2022 1014   Lab Results  Component Value Date   AST 35 10/30/2022   Lab Results  Component Value Date   ALT 32 10/30/2022   Urinalysis Results for orders placed or performed in visit on 01/08/23  Microscopic Examination   Urine  Result Value Ref Range   WBC, UA >30 (A) 0 - 5 /hpf   RBC, Urine 3-10 (A) 0 - 2 /hpf   Epithelial Cells (non renal) 0-10 0 - 10 /hpf   Bacteria, UA Many (A) None seen/Few  Urinalysis, Complete  Result Value Ref Range   Specific Gravity, UA 1.015 1.005 - 1.030   pH, UA 6.0 5.0 - 7.5   Color, UA Yellow Yellow   Appearance Ur Cloudy (A) Clear   Leukocytes,UA 1+ (A) Negative   Protein,UA Negative Negative/Trace   Glucose, UA Negative Negative   Ketones, UA Negative Negative   RBC, UA 1+ (A) Negative   Bilirubin, UA Negative Negative   Urobilinogen, Ur 0.2 0.2 - 1.0 mg/dL   Nitrite, UA Positive (A) Negative   Microscopic Examination See below:     I have reviewed the labs.   Pertinent Imaging: N/A   Assessment & Plan:    1. Suspected UTI -UA w/ nitrite positive, pyuria, hematuria and bacteruria   -urine sent for culture  -started on Ceftin 500 mg BID until culture and sensitivities are available and will adjust if necessary  2. Microscopic hematuria -likely due to  UTI -advised the patient that we need to recheck her urine in one month to ensure it resolved with treatment of infection, if it does not resolve it may be a sign of a GU malignancy and we will need to run further tests    Return in about 1 month (around 02/08/2023) for UA and symptom recheck .  These notes generated with voice recognition software. I apologize for typographical errors.  Cloretta Ned  [facility] [address]  [address] [place], [place] [address] [phone]

## 2023-01-21 ENCOUNTER — Telehealth: Payer: Self-pay

## 2023-01-21 ENCOUNTER — Telehealth: Payer: Self-pay | Admitting: Gastroenterology

## 2023-01-21 ENCOUNTER — Other Ambulatory Visit: Payer: Self-pay | Admitting: Physician Assistant

## 2023-01-21 DIAGNOSIS — K219 Gastro-esophageal reflux disease without esophagitis: Secondary | ICD-10-CM

## 2023-01-21 DIAGNOSIS — K2100 Gastro-esophageal reflux disease with esophagitis, without bleeding: Secondary | ICD-10-CM

## 2023-01-21 MED ORDER — PANTOPRAZOLE SODIUM 40 MG PO TBEC
40.0000 mg | DELAYED_RELEASE_TABLET | Freq: Every day | ORAL | 3 refills | Status: DC
Start: 2023-01-21 — End: 2023-01-26

## 2023-01-21 NOTE — Telephone Encounter (Signed)
Patient called in she needs a refill on her medication. Patient stated when she came to her last visit, she was told to double up her medication and now she is running low.

## 2023-01-21 NOTE — Telephone Encounter (Signed)
Patient needs refill on Pantoprazole 40 mg. Told patient to check with pharmacy later today.

## 2023-01-21 NOTE — Telephone Encounter (Signed)
Needs refill pantoprazole

## 2023-01-21 NOTE — Telephone Encounter (Signed)
Spoke with patient to let her know Pantoprazole 40 mg was sent to [facility].

## 2023-01-26 ENCOUNTER — Telehealth: Payer: Self-pay

## 2023-01-26 ENCOUNTER — Telehealth: Payer: Self-pay | Admitting: Physician Assistant

## 2023-01-26 MED ORDER — PANTOPRAZOLE SODIUM 40 MG PO TBEC: 40.0000 mg | DELAYED_RELEASE_TABLET | Freq: Two times a day (BID) | ORAL | 3 refills | Status: DC

## 2023-01-26 NOTE — Telephone Encounter (Signed)
Spoke with patient- she needs RX Pantoprazole 40 mg BID sent to pharmacy.

## 2023-01-26 NOTE — Telephone Encounter (Signed)
Patient called in requesting a refill on pantoprazole 40 MG. The patient stated that mrs. Gerre Pebbles up her dose to two a day one in the morning and one at night. Patient stated that her pharmacy only gave her 90 pills and they didn't have the update lab on it as well. Patient stated that [facility] need the new prescription.

## 2023-01-27 ENCOUNTER — Ambulatory Visit (INDEPENDENT_AMBULATORY_CARE_PROVIDER_SITE_OTHER): Payer: Medicare Other | Admitting: Internal Medicine

## 2023-01-27 ENCOUNTER — Encounter: Payer: Self-pay | Admitting: Internal Medicine

## 2023-01-27 VITALS — BP 124/60 | HR 72 | Ht 62.0 in | Wt 129.0 lb

## 2023-01-27 DIAGNOSIS — R053 Chronic cough: Secondary | ICD-10-CM | POA: Diagnosis not present

## 2023-01-27 DIAGNOSIS — L723 Sebaceous cyst: Secondary | ICD-10-CM

## 2023-01-27 NOTE — Patient Instructions (Signed)
Stop Ramipril until next visit

## 2023-01-27 NOTE — Progress Notes (Signed)
Date:  01/27/2023   Name:  Rebecca Gutierrez   DOB:  1940/11/27   MRN:  [id number]   Chief Complaint: Shoulder Pain (Right arm/shoulder. Lump in skin. Noticed it a few days ago. Not painful.)  Cough This is a chronic problem. The current episode started more than 1 month ago. The problem has been unchanged. The problem occurs every few minutes. The cough is Non-productive. Pertinent negatives include no chest pain, chills, fever or wheezing.   Cyst - noted on right upper arm.  No pain or redness, no drainage.  Lab Results  Component Value Date   NA 138 10/30/2022   K 4.2 10/30/2022   CO2 24 10/30/2022   GLUCOSE 109 (H) 10/30/2022   BUN 18 10/30/2022   CREATININE 0.86 10/30/2022   CALCIUM 9.6 10/30/2022   EGFR 68 10/30/2022   GFRNONAA 78 07/22/2020   Lab Results  Component Value Date   CHOL 163 10/30/2022   HDL 57 10/30/2022   LDLCALC 89 10/30/2022   TRIG 94 10/30/2022   CHOLHDL 2.9 10/30/2022   Lab Results  Component Value Date   TSH 2.980 10/30/2022   Lab Results  Component Value Date   HGBA1C 5.8 (H) 10/30/2022   Lab Results  Component Value Date   WBC 4.9 10/30/2022   HGB 13.7 10/30/2022   HCT 42.3 10/30/2022   MCV 89 10/30/2022   PLT 161 10/30/2022   Lab Results  Component Value Date   ALT 32 10/30/2022   AST 35 10/30/2022   ALKPHOS 73 10/30/2022   BILITOT 1.4 (H) 10/30/2022   Lab Results  Component Value Date   VD25OH 32.2 04/01/2016     Review of Systems  Constitutional:  Negative for chills, fatigue and fever.  Respiratory:  Positive for cough. Negative for chest tightness and wheezing.   Cardiovascular:  Negative for chest pain.  Psychiatric/Behavioral:  Positive for sleep disturbance. Negative for dysphoric mood. The patient is not nervous/anxious.     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   Hearing loss 06/02/2007   Essential hypertension, benign 06/02/2007    Allergies  Allergen Reactions   Ciprofloxacin Other (See Comments)    Tendinitis    Levaquin [Levofloxacin] Other (See Comments)    insomnia   Potassium Clavulanate [Clavulanic Acid] Diarrhea   [name] Diarrhea   [name] Palpitations and Rash    Last reaction was in 1969    Past Surgical History:  Procedure Laterality Date   ANTERIOR AND POSTERIOR REPAIR N/A 05/02/2013   Procedure: ANTERIOR (CYSTOCELE) AND POSTERIOR REPAIR (RECTOCELE);  Surgeon: [name], MD;  Location: [facility];  Service: Gynecology;  Laterality: N/A;   BRAVO PH STUDY N/A 10/21/2021   Procedure: BRAVO PH STUDY;  Surgeon: Midge Minium, MD;  Location: [facility] ENDOSCOPY;  Service: Endoscopy;  Laterality: N/A;   CATARACT EXTRACTION, BILATERAL     CHOLECYSTECTOMY  10/15/2020   COLONOSCOPY  08/28/2011   Procedure: COLONOSCOPY;  Surgeon: Malissa Hippo, MD;  Location: [facility];  Service: Endoscopy;  Laterality: N/A;  930   ESOPHAGOGASTRODUODENOSCOPY N/A  10/21/2021   Procedure: ESOPHAGOGASTRODUODENOSCOPY (EGD);  Surgeon: Midge Minium, MD;  Location: [facility] ENDOSCOPY;  Service: Endoscopy;  Laterality: N/A;   EYE SURGERY Bilateral 02/13/2011   other 2013   MOHS SURGERY  06/2016   Removed skin cancer from Rt shin   TUBAL LIGATION     VAGINAL HYSTERECTOMY N/A 05/02/2013   Procedure: HYSTERECTOMY VAGINAL;  Surgeon: [name], MD;  Location: [facility];  Service: Gynecology;  Laterality: N/A;    Social History   Tobacco Use   Smoking status: Never   Smokeless tobacco: Never  Vaping Use   Vaping status: Never Used  Substance  Use Topics   Alcohol use: Yes    Alcohol/week: 1.0 standard drink of alcohol    Types: 1 Standard drinks or equivalent per week   Drug use: Never     Medication list has been reviewed and updated.  Current Meds  Medication Sig   aspirin EC 81 MG tablet Take 81 mg by mouth at bedtime.    cefUROXime (CEFTIN) 500 MG tablet Take 1 tablet (500 mg total) by mouth 2 (two) times daily with a meal.   Cholecalciferol 50 MCG (2000 UT) TABS Take 1 capsule by mouth in the morning.   estradiol (ESTRACE VAGINAL) 0.1 MG/GM vaginal cream Apply 0.5mg  (pea-sized amount)  just inside the vaginal introitus with a finger-tip on Monday, Wednesday and Friday nights.   hydrochlorothiazide (HYDRODIURIL) 25 MG tablet Take 1 tablet (25 mg total) by mouth daily.   metoprolol tartrate (LOPRESSOR) 25 MG tablet TAKE 1 AND 1/2 TABLETS BY MOUTH  TWICE DAILY   pantoprazole (PROTONIX) 40 MG tablet Take 1 tablet (40 mg total) by mouth 2 (two) times daily.   ramipril (ALTACE) 10 MG capsule Take 1 capsule (10 mg total) by mouth daily.   simvastatin (ZOCOR) 10 MG tablet Take 1 tablet (10 mg total) by mouth daily.       12/31/2022   11:18 AM 10/30/2022    9:32 AM 10/21/2022   10:31 AM 07/30/2022   10:26 AM  GAD 7 : Generalized Anxiety Score  Nervous, Anxious, on Edge 0 0 0 0  Control/stop worrying 0 0 0 0  Worry too much - different things 0 0 0 0  Trouble relaxing 0 0 0 0  Restless 0 0 0 0  Easily annoyed or irritable 0 0 0 0  Afraid - awful might happen 0 0 0 0  Total GAD 7 Score 0 0 0 0  Anxiety Difficulty Not difficult at all Not difficult at all Not difficult at all Not difficult at all       12/31/2022   11:18 AM 11/04/2022   10:07 AM 10/30/2022    9:31 AM  Depression screen PHQ 2/9  Decreased Interest 0 0 0  Down, Depressed, Hopeless 0 0 0  PHQ - 2 Score 0 0 0  Altered sleeping 1 0 0  Tired, decreased energy 0 0 0  Change in appetite 0 0 0  Feeling bad or failure about yourself  0 0 0  Trouble  concentrating 0 0 0  Moving slowly or fidgety/restless 0 0 0  Suicidal thoughts 0 0 0  PHQ-9 Score 1 0 0  Difficult doing work/chores Not difficult at all Not difficult at all Not difficult at all    BP Readings from Last 3 Encounters:  01/27/23 124/60  01/08/23 (!) 164/76  12/31/22 124/76    Physical Exam Vitals and nursing note reviewed.  Constitutional:  General: She is not in acute distress.    Appearance: Normal appearance. She is well-developed.  HENT:     Head: Normocephalic and atraumatic.  Cardiovascular:     Rate and Rhythm: Normal rate and regular rhythm.  Pulmonary:     Effort: Pulmonary effort is normal. No respiratory distress.     Breath sounds: No wheezing or rhonchi.  Musculoskeletal:     Right lower leg: No edema.     Left lower leg: No edema.  Skin:    General: Skin is warm and dry.     Findings: No rash.          Comments: 1.5 cm firm mobile non tender cyst  Neurological:     Mental Status: She is alert and oriented to person, place, and time.  Psychiatric:        Mood and Affect: Mood normal.        Behavior: Behavior normal.     Wt Readings from Last 3 Encounters:  01/27/23 129 lb (58.5 kg)  01/08/23 127 lb (57.6 kg)  12/31/22 129 lb (58.5 kg)    BP 124/60   Pulse 72   Ht 5\' 2"  (1.575 m)   Wt 129 lb (58.5 kg)   SpO2 96%   BMI 23.59 kg/m   Assessment and Plan:  Problem List Items Addressed This Visit       Unprioritized   Sebaceous cyst - Primary    Of right upper arm - appears benign No treatment needed unless it becomes infected or asymptomatic      Chronic cough    could be due to ACEI already on bid PPI will do a trial off of Ramipril       Return in about 3 weeks (around 02/17/2023) for HTN off of ramipril.    Reubin Milan, MD [facility] [place]

## 2023-01-27 NOTE — Assessment & Plan Note (Signed)
could be due to ACEI already on bid PPI will do a trial off of Ramipril

## 2023-01-27 NOTE — Assessment & Plan Note (Signed)
Of right upper arm - appears benign No treatment needed unless it becomes infected or asymptomatic

## 2023-02-08 ENCOUNTER — Ambulatory Visit: Payer: Medicare Other | Admitting: Urology

## 2023-02-10 NOTE — Progress Notes (Signed)
Rebecca Amy, PA-C [address]  [address]  [place], [place] [address]  Main: [phone]  Fax: [phone]   Primary Care Physician: Reubin Milan, MD  Primary Gastroenterologist:  Rebecca Amy, PA-C / Dr. Midge Minium    CC: F/U GERD, large hiatal hernia, and chronic cough  HPI: Rebecca Gutierrez is a 82 y.o. female returns for 6-week follow-up of GERD, large hiatal hernia, and chronic cough.  6 weeks ago pantoprazole was increased to 40 mg 1 tablet twice daily.  I Also recommended adding Pepcid 20 mg twice daily, however she has NOT started Pepcid.  She takes pantoprazole 30 minutes before breakfast and dinnertime.  She also takes Tums before bedtime.  Trial of high-dose PPI.  Has not helped her cough.  She was taken off lisinopril ACE inhibitor 2 weeks ago by her PCP.  Current symptoms: She states her cough is about the same.  It is worse when she talks.  Cough is better in the morning.  Worse after lunch and in the evening.  She sleeps with her head elevated at night.  She has increased coughing when she first lays down but resolves and does not last throughout the night.  She does not feel any acid coming up.  She denies heartburn.  She has had a few episodes where she got short of breath with exertion when she was gardening.  She has not had a chest x-ray or pulmonary evaluation.  She reports she has had a cough for about 2 years.  It worsened in the spring 2024 and has been persistent.  She saw ENT 3 weeks ago.  Reports having normal laryngoscopy.  She has not had a chest x-ray or pulmonary evaluation.  Bravo test 10/2021 which showed many episodes of acid reflux.   EGD 10/21/2021 showed a large 10 cm hiatal hernia, normal stomach and normal duodenum.  Esophageal biopsies were negative for Barrett's esophagus.  Mild chronic esophagitis.  Labs 10/30/2022 showed normal CBC, CMP, and TSH. GFR 68. Hemoglobin 13.7.   Prior to Admission medications   Medication Sig Start Date  End Date Taking? Authorizing Provider  aspirin EC 81 MG tablet Take 81 mg by mouth at bedtime.    Yes [provider]  Cholecalciferol 50 MCG (2000 UT) TABS Take 1 capsule by mouth in the morning.   Yes [provider]  estradiol (ESTRACE VAGINAL) 0.1 MG/GM vaginal cream Apply 0.5mg  (pea-sized amount)  just inside the vaginal introitus with a finger-tip on Monday, Wednesday and Friday nights. 04/30/21  Yes Reubin Milan, MD  hydrochlorothiazide (HYDRODIURIL) 25 MG tablet Take 1 tablet (25 mg total) by mouth daily. 10/21/22  Yes Reubin Milan, MD  metoprolol tartrate (LOPRESSOR) 25 MG tablet TAKE 1 AND 1/2 TABLETS BY MOUTH  TWICE DAILY 10/21/22  Yes Reubin Milan, MD  pantoprazole (PROTONIX) 40 MG tablet Take 1 tablet (40 mg total) by mouth 2 (two) times daily. 01/26/23  Yes Rebecca Amy, PA-C  simvastatin (ZOCOR) 10 MG tablet Take 1 tablet (10 mg total) by mouth daily. 10/30/22  Yes Reubin Milan, MD      Allergies as of 02/11/2023 - Review Complete 02/11/2023  Allergen Reaction Noted   Ciprofloxacin Other (See Comments) 05/27/2018   Levaquin [levofloxacin] Other (See Comments) 05/11/2017   Potassium clavulanate [clavulanic acid] Diarrhea 10/21/2022   [name] Diarrhea 06/24/2015   [name] Palpitations and Rash 06/02/2007    Past Medical History:  Diagnosis Date   Abnormal  ECG 10/28/2017   Anxiety    Bradycardia 11/10/2017   Cancer (HCC)    skin ca   Cystocele 09/13/2013   small recurrent, less than before 09/13/13 JVF    Gilbert's syndrome    Hearing loss 1975   History of recurrent urinary tract infection 03/24/2016   Hyperlipidemia 2000   Hypertension 2000   Recurrent UTI 03/10/2015    Past Surgical History:  Procedure Laterality Date   ANTERIOR AND POSTERIOR REPAIR N/A 05/02/2013   Procedure: ANTERIOR (CYSTOCELE) AND POSTERIOR REPAIR (RECTOCELE);  Surgeon: [name], MD;  Location: [facility];  Service:  Gynecology;  Laterality: N/A;   BRAVO PH STUDY N/A 10/21/2021   Procedure: BRAVO PH STUDY;  Surgeon: Midge Minium, MD;  Location: [facility] ENDOSCOPY;  Service: Endoscopy;  Laterality: N/A;   CATARACT EXTRACTION, BILATERAL     CHOLECYSTECTOMY  10/15/2020   COLONOSCOPY  08/28/2011   Procedure: COLONOSCOPY;  Surgeon: Malissa Hippo, MD;  [facility];  Service: Endoscopy;  Laterality: N/A;  930   ESOPHAGOGASTRODUODENOSCOPY N/A 10/21/2021   Procedure: ESOPHAGOGASTRODUODENOSCOPY (EGD);  Surgeon: Midge Minium, MD;  Location: [facility] ENDOSCOPY;  Service: Endoscopy;  Laterality: N/A;   EYE SURGERY Bilateral 02/13/2011   other 2013   MOHS SURGERY  06/2016   Removed skin cancer from Rt shin   TUBAL LIGATION     VAGINAL HYSTERECTOMY N/A 05/02/2013   Procedure: HYSTERECTOMY VAGINAL;  Surgeon: [name], MD;  Location: [facility];  Service: Gynecology;  Laterality: N/A;    Review of Systems:    All systems reviewed and negative except where noted in HPI.   Physical Examination:   BP 137/78 (BP Location: Right Arm, Patient Position: Sitting, Cuff Size: Normal)   Pulse 66   Temp 97.9 F (36.6 C) (Oral)   Ht 5\' 2"  (1.575 m)   Wt 128 lb 4 oz (58.2 kg)   BMI 23.46 kg/m   General: Well-nourished, well-developed in no acute distress.  Lungs: He has a chronic persistent cough throughout her visit today.  Cough is worse when she talks.  Does not appear to be short of breath at rest.  I hear a slight expiratory wheeze over the left posterior lower lung field.  No rales or rhonchi.  No labored breathing.  No use of accessory muscles to breathe. Heart: Regular rate and rhythm, no murmurs rubs or gallops.  Abdomen: Bowel sounds are normal; Abdomen is Soft; No hepatosplenomegaly, masses or hernias;  No Abdominal Tenderness; No guarding or rebound tenderness. Neuro: Alert and oriented x 3.  Grossly intact.  Psych: Alert and cooperative, normal mood and affect.   Imaging Studies: See  HPI.  Assessment and Plan:   Rebecca Gutierrez is a 82 y.o. y/o female returns for follow-up of chronic cough and chronic acid reflux.  EGD last year showed large 10 cm hiatal hernia.  Bravo test showed several episodes of acid reflux.  She was recently taken off lisinopril ACE inhibitor 2 weeks ago.  She has not had a chest x-ray or pulmonary evaluation.  She is on high-dose PPI pantoprazole 40 Mg twice daily and continues to have persistent chronic cough.  She also reports a few episodes of shortness of breath with exertion.  I am ordering a chest x-ray and pulmonology evaluation.  Asthma, reactive airway disease, and allergies are in the differential.  She is on maximal treatment for GERD.  1.  GERD  Continue pantoprazole to 40 mg 1 tablet twice daily             Add Pepcid 20 Mg twice daily. Continue Tums as needed.             Continue lifestyle Modifications to prevent Acid Reflux.    2.  Large hiatal hernia             We discussed risks and benefits of fundoplication surgery to repair hiatal hernia.  Patient declined referral for surgery evaluation at this time.  Surgery as a last resort if symptoms persist.   3.  Chronic cough: Rule out pulmonary etiology.             Remain off lisinopril ACE inhibitor.  Ordering chest x-ray two-view PA and lateral.  Referred to pulmonology for further evaluation.  Rebecca Amy, PA-C  Follow up with GI in 3 months.

## 2023-02-11 ENCOUNTER — Ambulatory Visit: Payer: Medicare Other | Admitting: Urology

## 2023-02-11 ENCOUNTER — Encounter: Payer: Self-pay | Admitting: Physician Assistant

## 2023-02-11 ENCOUNTER — Ambulatory Visit (INDEPENDENT_AMBULATORY_CARE_PROVIDER_SITE_OTHER): Payer: Medicare Other | Admitting: Physician Assistant

## 2023-02-11 VITALS — BP 137/78 | HR 66 | Temp 97.9°F | Ht 62.0 in | Wt 128.2 lb

## 2023-02-11 DIAGNOSIS — R053 Chronic cough: Secondary | ICD-10-CM | POA: Diagnosis not present

## 2023-02-11 DIAGNOSIS — K449 Diaphragmatic hernia without obstruction or gangrene: Secondary | ICD-10-CM | POA: Diagnosis not present

## 2023-02-11 DIAGNOSIS — K2100 Gastro-esophageal reflux disease with esophagitis, without bleeding: Secondary | ICD-10-CM

## 2023-02-11 DIAGNOSIS — R0609 Other forms of dyspnea: Secondary | ICD-10-CM

## 2023-02-11 MED ORDER — FAMOTIDINE 20 MG PO TABS
20.0000 mg | ORAL_TABLET | Freq: Two times a day (BID) | ORAL | 0 refills | Status: DC
Start: 2023-02-11 — End: 2023-04-27

## 2023-02-11 NOTE — Progress Notes (Unsigned)
02/12/2023 12:22 PM   Carmisha Gershenson Shone [birth year] [id number]  Referring provider: Reubin Milan, MD [address] [place],  [place] [address]  Urological history: 1. rUTI's -contributing factors of age, GSM and incomplete bladder emptying -cysto (2019) - NED -documented urine cultures over the last year  January 08, 2023 - Klebsiella pneumoniae  Oct 26, 2022 - Klebsiella pneumoniae  Oct 21, 2022 - Klebsiella pneumoniae  -nitrofurantoin 100 mg daily  2. Cystocele -contributing factors age age, vaginal births, hysterectomy and GSM -previously manage with a pessary  3. Parapelvic cysts -RUS (2021) - bilaterally   4. Left renal cyst -RUS (2021) - 1.4 cm left kidney  5. Urinary retention -post operatively (2022)   Chief Complaint  Patient presents with   Follow-up   HPI: EMMAJANE FRYER is a 82 y.o. female who presents today for one momth follow up.   Previous records reviewed.   At her visit on 01/08/2023, on Sunday night, she started to experience nocturia x 6 which is not typical for her.  As the weekend progressed, she also started to experience dysuria.   Patient denies any modifying or aggravating factors.  Patient denies any recent UTI's, gross hematuria or suprapubic/flank pain.  Patient denies any fevers, chills, nausea or vomiting.   UA yellow cloudy, specific remedy 1.015, 1+ heme, nitrate positive, pH 6.0, 1+ leuks, greater than 30 RBCs, 3-10 RBCs, 0-10 epithelial cells and many bacteria.  Urine culture positive for Klebsiella pneumoniae.  She was treated for seven days with culture appropriate antibiotics.    She feels well today.  Patient denies any modifying or aggravating factors.  Patient denies any recent UTI's, gross hematuria, dysuria or suprapubic/flank pain.  Patient denies any fevers, chills, nausea or vomiting.    UA yellow slightly cloudy, specific gravity 1.015, pH 6.5, 1+ leuks, 6-10 WBCs, 0-2 RBCs, 0-10 epithelial cells, 0-10 renal  epithelial cells, granular casts present, hyaline cast present, amorphous sediment present mucus threads present and moderate bacteria.  She continues to have a cough.    PMH: Past Medical History:  Diagnosis Date   Abnormal ECG 10/28/2017   Anxiety    Bradycardia 11/10/2017   Cancer (HCC)    skin ca   Cystocele 09/13/2013   small recurrent, less than before 09/13/13 JVF    Gilbert's syndrome    Hearing loss 1975   History of recurrent urinary tract infection 03/24/2016   Hyperlipidemia 2000   Hypertension 2000   Recurrent UTI 03/10/2015    Surgical History: Past Surgical History:  Procedure Laterality Date   ANTERIOR AND POSTERIOR REPAIR N/A 05/02/2013   Procedure: ANTERIOR (CYSTOCELE) AND POSTERIOR REPAIR (RECTOCELE);  Surgeon: [name], MD;  Location: [facility];  Service: Gynecology;  Laterality: N/A;   BRAVO PH STUDY N/A 10/21/2021   Procedure: BRAVO PH STUDY;  Surgeon: Midge Minium, MD;  Location: [facility] ENDOSCOPY;  Service: Endoscopy;  Laterality: N/A;   CATARACT EXTRACTION, BILATERAL     CHOLECYSTECTOMY  10/15/2020   COLONOSCOPY  08/28/2011   Procedure: COLONOSCOPY;  Surgeon: Malissa Hippo, MD;  [facility];  Service: Endoscopy;  Laterality: N/A;  930   ESOPHAGOGASTRODUODENOSCOPY N/A 10/21/2021   Procedure: ESOPHAGOGASTRODUODENOSCOPY (EGD);  Surgeon: Midge Minium, MD;  Location: [facility] ENDOSCOPY;  Service: Endoscopy;  Laterality: N/A;   EYE SURGERY Bilateral 02/13/2011   other 2013   MOHS SURGERY  06/2016   Removed skin cancer from Rt shin   TUBAL LIGATION  VAGINAL HYSTERECTOMY N/A 05/02/2013   Procedure: HYSTERECTOMY VAGINAL;  Surgeon: [name], MD;  Location: [facility];  Service: Gynecology;  Laterality: N/A;    Home Medications:  Allergies as of 02/12/2023       Reactions   Ciprofloxacin Other (See Comments)   Tendinitis    Levaquin [levofloxacin] Other (See Comments)   insomnia   Potassium Clavulanate [clavulanic Acid] Diarrhea   Omnicef  [cefdinir] Diarrhea   [name] Palpitations, Rash   Last reaction was in 1969        Medication List        Accurate as of February 12, 2023 12:22 PM. If you have any questions, ask your nurse or doctor.          aspirin EC 81 MG tablet Take 81 mg by mouth at bedtime.   Cholecalciferol 50 MCG (2000 UT) Tabs Take 1 capsule by mouth in the morning.   estradiol 0.1 MG/GM vaginal cream Commonly known as: ESTRACE VAGINAL Apply 0.5mg  (pea-sized amount)  just inside the vaginal introitus with a finger-tip on Monday, Wednesday and Friday nights.   famotidine 20 MG tablet Commonly known as: Pepcid Take 1 tablet (20 mg total) by mouth 2 (two) times daily.   hydrochlorothiazide 25 MG tablet Commonly known as: HYDRODIURIL Take 1 tablet (25 mg total) by mouth daily.   metoprolol tartrate 25 MG tablet Commonly known as: LOPRESSOR TAKE 1 AND 1/2 TABLETS BY MOUTH  TWICE DAILY   pantoprazole 40 MG tablet Commonly known as: PROTONIX Take 1 tablet (40 mg total) by mouth 2 (two) times daily.   simvastatin 10 MG tablet Commonly known as: ZOCOR Take 1 tablet (10 mg total) by mouth daily.        Allergies:  Allergies  Allergen Reactions   Ciprofloxacin Other (See Comments)    Tendinitis    Levaquin [Levofloxacin] Other (See Comments)    insomnia   Potassium Clavulanate [Clavulanic Acid] Diarrhea   [name] Diarrhea   [name] Palpitations and Rash    Last reaction was in 1969    Family History: Family History  Problem Relation Age of Onset   Hypertension Mother    Cancer Mother        stomach   Healthy Father    Healthy Brother    Colon cancer Neg Hx    Prostate cancer Neg Hx    Kidney cancer Neg Hx    Bladder Cancer Neg Hx    Breast cancer Neg Hx     Social History:  reports that she has never smoked. She has never used smokeless tobacco. She reports current alcohol use of about 1.0 standard drink of alcohol  per week. She reports that she does not use drugs.  ROS: Pertinent ROS in HPI  Physical Exam: BP 123/69   Pulse 67   Wt 128 lb 6.4 oz (58.2 kg)   BMI 23.48 kg/m   Constitutional:  Well nourished. Alert and oriented, No acute distress. HEENT: [place] AT, moist mucus membranes.  Trachea midline Cardiovascular: No clubbing, cyanosis, or edema. Respiratory: Normal respiratory effort, no increased work of breathing. Neurologic: Grossly intact, no focal deficits, moving all 4 extremities. Psychiatric: Normal mood and affect.    Laboratory Data: Urinalysis See HPI  I have reviewed the labs.   Pertinent Imaging: N/A  Assessment & Plan:    1. Suspected UTI -asymptomatic at this time -UA unremarkable -Discussed with her changing her prophylactic antibiotic nitrofurantoin to another antibiotic as nitrofurantoin does have some  pulmonary side effects, she deferred at this time citing she would like to wait to see what pulmonology has to say at the end of the month  2. Microscopic hematuria -UA resolved   Return for keep appointment with Dr. Sherron Monday in November .  These notes generated with voice recognition software. I apologize for typographical errors.  Cloretta Ned  [facility] [address]  [address] [place], [place] [address] [phone]

## 2023-02-11 NOTE — Patient Instructions (Addendum)
For acid reflux:  Continue pantoprazole 40 mg 1 tablet twice daily before breakfast and dinner. Also add famotidine (Pepcid) 20 mg 1 tablet twice daily before lunch and before bedtime. Continue Tums as needed.  For persistent cough: I am ordering a chest x-ray and referring to pulmonology for further evaluation.  Go to [place] [facility] to have chest xray done to the imaging department. You do not need appointment can go Monday through Friday 8am to 5:00pm.

## 2023-02-12 ENCOUNTER — Ambulatory Visit (INDEPENDENT_AMBULATORY_CARE_PROVIDER_SITE_OTHER): Payer: Medicare Other | Admitting: Urology

## 2023-02-12 ENCOUNTER — Encounter: Payer: Self-pay | Admitting: Urology

## 2023-02-12 VITALS — BP 123/69 | HR 67 | Wt 128.4 lb

## 2023-02-12 DIAGNOSIS — R3989 Other symptoms and signs involving the genitourinary system: Secondary | ICD-10-CM

## 2023-02-12 DIAGNOSIS — R3129 Other microscopic hematuria: Secondary | ICD-10-CM

## 2023-02-12 LAB — URINALYSIS, COMPLETE
Bilirubin, UA: NEGATIVE
Glucose, UA: NEGATIVE
Ketones, UA: NEGATIVE
Nitrite, UA: NEGATIVE
Protein,UA: NEGATIVE
RBC, UA: NEGATIVE
Specific Gravity, UA: 1.015 (ref 1.005–1.030)
Urobilinogen, Ur: 0.2 mg/dL (ref 0.2–1.0)
pH, UA: 6.5 (ref 5.0–7.5)

## 2023-02-12 LAB — MICROSCOPIC EXAMINATION

## 2023-02-16 ENCOUNTER — Ambulatory Visit
Admission: RE | Admit: 2023-02-16 | Discharge: 2023-02-16 | Disposition: A | Discharge status: Home / Self Care | Payer: Medicare Other | Attending: Physician Assistant | Admitting: Physician Assistant

## 2023-02-16 ENCOUNTER — Ambulatory Visit
Admission: RE | Admit: 2023-02-16 | Discharge: 2023-02-16 | Disposition: A | Discharge status: Home / Self Care | Payer: Medicare Other | Source: Ambulatory Visit | Attending: Physician Assistant | Admitting: Physician Assistant

## 2023-02-16 DIAGNOSIS — R0609 Other forms of dyspnea: Secondary | ICD-10-CM | POA: Insufficient documentation

## 2023-02-16 DIAGNOSIS — R053 Chronic cough: Secondary | ICD-10-CM

## 2023-02-18 NOTE — Progress Notes (Signed)
Rebecca Gutierrez, please call patient and notify her chest x-ray shows:  "Mild diffuse reticular densities are noted throughout both lungs concerning for pulmonary edema or atypical inflammation."  Patient needs to keep follow-up with her PCP, Dr. Otho Darner 9/17 and appt. to see Pulmonologist Dr. Larinda Buttery on 03/11/23.

## 2023-02-19 ENCOUNTER — Telehealth: Payer: Self-pay

## 2023-02-19 NOTE — Telephone Encounter (Signed)
Patient notified.   "Mild diffuse reticular densities are noted throughout both lungs  concerning for pulmonary edema or atypical inflammation."   Patient needs to keep follow-up with her PCP, Dr. Otho Darner 9/17 and appt. to see Pulmonologist Dr. Larinda Buttery on 03/11/23.

## 2023-03-02 ENCOUNTER — Other Ambulatory Visit: Payer: Self-pay | Admitting: Internal Medicine

## 2023-03-02 ENCOUNTER — Ambulatory Visit (INDEPENDENT_AMBULATORY_CARE_PROVIDER_SITE_OTHER): Payer: Medicare Other | Admitting: Internal Medicine

## 2023-03-02 ENCOUNTER — Encounter: Payer: Self-pay | Admitting: Internal Medicine

## 2023-03-02 VITALS — BP 110/74 | HR 78 | Ht 62.0 in | Wt 129.0 lb

## 2023-03-02 DIAGNOSIS — K219 Gastro-esophageal reflux disease without esophagitis: Secondary | ICD-10-CM | POA: Diagnosis not present

## 2023-03-02 DIAGNOSIS — Z23 Encounter for immunization: Secondary | ICD-10-CM | POA: Diagnosis not present

## 2023-03-02 DIAGNOSIS — I10 Essential (primary) hypertension: Secondary | ICD-10-CM | POA: Diagnosis not present

## 2023-03-02 DIAGNOSIS — R053 Chronic cough: Secondary | ICD-10-CM | POA: Diagnosis not present

## 2023-03-02 MED ORDER — METOPROLOL TARTRATE 25 MG PO TABS: ORAL_TABLET | ORAL | 1 refills | Status: DC

## 2023-03-02 NOTE — Assessment & Plan Note (Addendum)
Here for BP follow up. Ramipril held for dry cough in May. Now on hydrochlorothiazide and metoprolol. Apparently cough continued so CXR was done - reticular changes but not treated. BP today is controlled so will continue metoprolol and hydrochlorothiazide.

## 2023-03-02 NOTE — Progress Notes (Signed)
Date:  03/02/2023   Name:  Rebecca Gutierrez   DOB:  09-13-1940   MRN:  [id number]   Chief Complaint: Hypertension  Hypertension This is a chronic problem. The problem is controlled. Pertinent negatives include no chest pain, headaches, palpitations or shortness of breath. Past treatments include beta blockers and diuretics. The current treatment provides significant improvement.  Cough This is a chronic problem. The problem has been unchanged. The problem occurs every few minutes. The cough is Non-productive. Pertinent negatives include no chest pain, chills, fever, headaches, nasal congestion, sore throat, shortness of breath, weight loss or wheezing.   02/18/23: FINDINGS: The heart size and mediastinal contours are within normal limits. Mild diffuse reticular densities are noted throughout both lungs concerning for pulmonary edema or atypical inflammation. The visualized skeletal structures are unremarkable.   IMPRESSION: Mild diffuse reticular densities are noted throughout both lungs concerning for pulmonary edema or atypical inflammation.  Lab Results  Component Value Date   NA 138 10/30/2022   K 4.2 10/30/2022   CO2 24 10/30/2022   GLUCOSE 109 (H) 10/30/2022   BUN 18 10/30/2022   CREATININE 0.86 10/30/2022   CALCIUM 9.6 10/30/2022   EGFR 68 10/30/2022   GFRNONAA 78 07/22/2020   Lab Results  Component Value Date   CHOL 163 10/30/2022   HDL 57 10/30/2022   LDLCALC 89 10/30/2022   TRIG 94 10/30/2022   CHOLHDL 2.9 10/30/2022   Lab Results  Component Value Date   TSH 2.980 10/30/2022   Lab Results  Component Value Date   HGBA1C 5.8 (H) 10/30/2022   Lab Results  Component Value Date   WBC 4.9 10/30/2022   HGB 13.7 10/30/2022   HCT 42.3 10/30/2022   MCV 89 10/30/2022   PLT 161 10/30/2022   Lab Results  Component Value Date   ALT 32 10/30/2022   AST 35 10/30/2022   ALKPHOS 73 10/30/2022   BILITOT 1.4 (H) 10/30/2022   Lab Results  Component Value  Date   VD25OH 32.2 04/01/2016     Review of Systems  Constitutional:  Negative for chills, diaphoresis, fever, unexpected weight change and weight loss.  HENT:  Negative for sinus pressure and sore throat.   Respiratory:  Positive for cough. Negative for shortness of breath and wheezing.   Cardiovascular:  Negative for chest pain and palpitations.  Gastrointestinal:  Negative for abdominal pain.  Neurological:  Negative for dizziness and headaches.  Psychiatric/Behavioral:  Positive for sleep disturbance. Negative for dysphoric mood. The patient is not nervous/anxious.     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   Hearing loss 06/02/2007   Essential hypertension, benign 06/02/2007    Allergies  Allergen Reactions   Ciprofloxacin Other (See Comments)    Tendinitis    Levaquin [Levofloxacin] Other (See Comments)    insomnia   Potassium Clavulanate [Clavulanic Acid] Diarrhea   [name] Diarrhea   [name] Palpitations and Rash    Last reaction was in 1969    Past Surgical History:  Procedure Laterality Date   ANTERIOR AND POSTERIOR REPAIR N/A 05/02/2013  Procedure: ANTERIOR (CYSTOCELE) AND POSTERIOR REPAIR (RECTOCELE);  Surgeon: [name], MD;  Location: [facility];  Service: Gynecology;  Laterality: N/A;   BRAVO PH STUDY N/A 10/21/2021   Procedure: BRAVO PH STUDY;  Surgeon: Midge Minium, MD;  Location: [facility] ENDOSCOPY;  Service: Endoscopy;  Laterality: N/A;   CATARACT EXTRACTION, BILATERAL     CHOLECYSTECTOMY  10/15/2020    COLONOSCOPY  08/28/2011   Procedure: COLONOSCOPY;  Surgeon: Malissa Hippo, MD;  Location: [facility] ENDO SUITE;  Service: Endoscopy;  Laterality: N/A;  930   ESOPHAGOGASTRODUODENOSCOPY N/A 10/21/2021   Procedure: ESOPHAGOGASTRODUODENOSCOPY (EGD);  Surgeon: Midge Minium, MD;  Location: [facility] ENDOSCOPY;  Service: Endoscopy;  Laterality: N/A;   EYE SURGERY Bilateral 02/13/2011   other 2013   MOHS SURGERY  06/2016   Removed skin cancer from Rt shin   TUBAL LIGATION     VAGINAL HYSTERECTOMY N/A 05/02/2013   Procedure: HYSTERECTOMY VAGINAL;  Surgeon: [name], MD;  Location: [facility]S;  Service: Gynecology;  Laterality: N/A;    Social History   Tobacco Use   Smoking status: Never   Smokeless tobacco: Never  Vaping Use   Vaping status: Never Used  Substance Use Topics   Alcohol use: Yes    Alcohol/week: 1.0 standard drink of alcohol    Types: 1 Standard drinks or equivalent per week   Drug use: Never     Medication list has been reviewed and updated.  Current Meds  Medication Sig   aspirin EC 81 MG tablet Take 81 mg by mouth at bedtime.    Cholecalciferol 50 MCG (2000 UT) TABS Take 1 capsule by mouth in the morning.   estradiol (ESTRACE VAGINAL) 0.1 MG/GM vaginal cream Apply 0.5mg  (pea-sized amount)  just inside the vaginal introitus with a finger-tip on Monday, Wednesday and Friday nights.   famotidine (PEPCID) 20 MG tablet Take 1 tablet (20 mg total) by mouth 2 (two) times daily.   hydrochlorothiazide (HYDRODIURIL) 25 MG tablet Take 1 tablet (25 mg total) by mouth daily.   metoprolol tartrate (LOPRESSOR) 25 MG tablet TAKE 1 AND 1/2 TABLETS BY MOUTH  TWICE DAILY   pantoprazole (PROTONIX) 40 MG tablet Take 1 tablet (40 mg total) by mouth 2 (two) times daily.   simvastatin (ZOCOR) 10 MG tablet Take 1 tablet (10 mg total) by mouth daily.       03/02/2023   10:16 AM 12/31/2022   11:18 AM 10/30/2022    9:32 AM 10/21/2022   10:31 AM  GAD 7 : Generalized Anxiety Score  Nervous, Anxious,  on Edge 0 0 0 0  Control/stop worrying 0 0 0 0  Worry too much - different things 0 0 0 0  Trouble relaxing 0 0 0 0  Restless 0 0 0 0  Easily annoyed or irritable 0 0 0 0  Afraid - awful might happen 0 0 0 0  Total GAD 7 Score 0 0 0 0  Anxiety Difficulty Not difficult at all Not difficult at all Not difficult at all Not difficult at all       03/02/2023   10:16 AM 12/31/2022   11:18 AM 11/04/2022   10:07 AM  Depression screen PHQ 2/9  Decreased Interest 0 0 0  Down, Depressed, Hopeless 0 0 0  PHQ - 2 Score 0 0 0  Altered sleeping 0 1 0  Tired, decreased energy 0 0 0  Change in appetite 0 0 0  Feeling bad or failure about yourself  0 0 0  Trouble concentrating 0 0 0  Moving slowly or fidgety/restless 0 0 0  Suicidal thoughts 0 0 0  PHQ-9 Score 0 1 0  Difficult doing work/chores Not difficult at all Not difficult at all Not difficult at all    BP Readings from Last 3 Encounters:  03/02/23 110/74  02/12/23 123/69  02/11/23 137/78    Physical Exam Vitals and nursing note reviewed.  Constitutional:      General: She is not in acute distress.    Appearance: Normal appearance. She is well-developed.  HENT:     Head: Normocephalic and atraumatic.  Cardiovascular:     Rate and Rhythm: Normal rate and regular rhythm.     Pulses: Normal pulses.  Pulmonary:     Effort: Pulmonary effort is normal. No respiratory distress.     Breath sounds: No wheezing or rhonchi.  Musculoskeletal:        General: Swelling (trace swelling top of foot) present.     Cervical back: Normal range of motion.  Lymphadenopathy:     Cervical: No cervical adenopathy.  Skin:    General: Skin is warm and dry.     Capillary Refill: Capillary refill takes less than 2 seconds.     Findings: No rash.  Neurological:     Mental Status: She is alert and oriented to person, place, and time.  Psychiatric:        Mood and Affect: Mood normal.        Behavior: Behavior normal.     Wt Readings from Last 3  Encounters:  03/02/23 129 lb (58.5 kg)  02/12/23 128 lb 6.4 oz (58.2 kg)  02/11/23 128 lb 4 oz (58.2 kg)    BP 110/74   Pulse 78   Ht 5\' 2"  (1.575 m)   Wt 129 lb (58.5 kg)   SpO2 94%   BMI 23.59 kg/m   Assessment and Plan:  Problem List Items Addressed This Visit       Unprioritized   Gastroesophageal reflux disease (Chronic)    GI feels this is causing her cough On PPI and Pepcid plus TUMS as needed      Essential hypertension, benign - Primary (Chronic)    Here for BP follow up. Ramipril held for dry cough in May. Now on hydrochlorothiazide and metoprolol. Apparently cough continued so CXR was done - reticular changes but not treated. BP today is controlled so will continue metoprolol and hydrochlorothiazide.       Chronic cough    Recent CXR abnormal GI thinks it is reflux so Pepcid was added. She has an appointment with Pulmonary next week      Other Visit Diagnoses     Need for vaccination for pneumococcus       Relevant Orders   Pneumococcal conjugate vaccine 20-valent (Completed)       No follow-ups on file.    Reubin Milan, MD [facility] [place]

## 2023-03-02 NOTE — Assessment & Plan Note (Addendum)
Recent CXR abnormal GI thinks it is reflux so Pepcid was added. She has an appointment with Pulmonary next week

## 2023-03-02 NOTE — Assessment & Plan Note (Signed)
GI feels this is causing her cough On PPI and Pepcid plus TUMS as needed

## 2023-03-06 ENCOUNTER — Other Ambulatory Visit: Payer: Self-pay | Admitting: Internal Medicine

## 2023-03-06 DIAGNOSIS — I10 Essential (primary) hypertension: Secondary | ICD-10-CM

## 2023-03-09 ENCOUNTER — Telehealth: Payer: Self-pay | Admitting: *Deleted

## 2023-03-09 DIAGNOSIS — N3020 Other chronic cystitis without hematuria: Secondary | ICD-10-CM

## 2023-03-09 NOTE — Telephone Encounter (Signed)
Pt left message on triage VM asking why she had so many abnormalities on her urine test.  .left message to have patient return my call.

## 2023-03-11 ENCOUNTER — Ambulatory Visit: Payer: Medicare Other | Admitting: Pulmonary Disease

## 2023-03-11 ENCOUNTER — Encounter: Payer: Self-pay | Admitting: Pulmonary Disease

## 2023-03-11 VITALS — BP 136/60 | HR 83 | Temp 97.8°F | Ht 62.0 in | Wt 128.4 lb

## 2023-03-11 DIAGNOSIS — R053 Chronic cough: Secondary | ICD-10-CM | POA: Diagnosis not present

## 2023-03-11 MED ORDER — BUDESONIDE-FORMOTEROL FUMARATE 80-4.5 MCG/ACT IN AERO
2.0000 | INHALATION_SPRAY | Freq: Two times a day (BID) | RESPIRATORY_TRACT | 12 refills | Status: DC
Start: 2023-03-11 — End: 2023-09-29

## 2023-03-11 NOTE — Telephone Encounter (Signed)
Pt walked in asking if she can have another urine test based on her abnormalities. Pt was very confused and wanted a retest, orders put in for [place].

## 2023-03-11 NOTE — Progress Notes (Signed)
Synopsis: Referred in by Reubin Milan, MD   Subjective:   PATIENT ID: Rebecca Gutierrez GENDER: female DOB: [birth year], MRN: [id number]  Chief Complaint  Patient presents with   Consult    Cough x 2 years. Cough worse when she talks. Occasional shortness of breath with exertion.     HPI Rebecca Gutierrez is an 82 year old female patient with a past medical history of GERD, hiatal hernia, hyperlipidemia and hypertension presenting to the pulmonary clinic as a referral from her primary care physician for chronic cough.  She said that back in May her cough started to worsen now it is soon as she vocalizes she starts coughing her is dry and associated with shortness of breath specifically on exertion.  She denies any heartburn denies acid taste in mouth and denies postnasal drip.  She denies any history of asthma or any trigger to the cough.  She denies any chest wheezing or chest tightness.  She saw ENT specialist underwent a laryngoscopy did not find any abnormalities.  She does follow-up with a gastroenterologist and have her on Protonix 40 mg twice daily as well as famotidine 20 mg twice daily.  She underwent a chest x-ray on 09/03 that showed diffuse reticular densities throughout both lungs.  Family history she denies any family history of pulmonary diseases.  Social history never smoker drinks alcohol occasionally no illicit drug use and no pets at home.  ROS All systems were reviewed and are negative except for the above.  Objective:   Vitals:   03/11/23 1325  BP: 136/60  Pulse: 83  Temp: 97.8 F (36.6 C)  TempSrc: Temporal  SpO2: 95%  Weight: 128 lb 6.4 oz (58.2 kg)  Height: 5\' 2"  (1.575 m)   95% on RA BMI Readings from Last 3 Encounters:  03/11/23 23.48 kg/m  03/02/23 23.59 kg/m  02/12/23 23.48 kg/m   Wt Readings from Last 3 Encounters:  03/11/23 128 lb 6.4 oz (58.2 kg)  03/02/23 129 lb (58.5 kg)  02/12/23 128 lb 6.4 oz (58.2 kg)    Physical Exam GEN:  NAD, Healthy Appearing HEENT: Supple Neck, Reactive Pupils, EOMI  CVS: Normal S1, Normal S2, RRR, No murmurs or ES appreciated  Lungs: Clear bilateral air entry.  Abdomen: Soft, non tender, non distended, + BS  Extremities: Warm and well perfused, No edema  Skin: No suspicious lesions appreciated  Psych: Normal Affect  Ancillary Information   CBC    Component Value Date/Time   WBC 4.9 10/30/2022 1014   WBC 3.4 (L) 03/29/2015 0912   RBC 4.75 10/30/2022 1014   RBC 5 01/13/2018 1147   RBC 4.49 03/29/2015 0912   HGB 13.7 10/30/2022 1014   HCT 42.3 10/30/2022 1014   PLT 161 10/30/2022 1014   MCV 89 10/30/2022 1014   MCV 91 10/13/2011 1515   MCH 28.8 10/30/2022 1014   MCH 31.2 03/29/2015 0912   MCHC 32.4 10/30/2022 1014   MCHC 35.6 03/29/2015 0912   RDW 12.8 10/30/2022 1014   RDW 13.3 10/13/2011 1515   LYMPHSABS 0.9 10/30/2022 1014   MONOABS 0.3 03/29/2015 0912   EOSABS 0.2 10/30/2022 1014   BASOSABS 0.0 10/30/2022 1014        No data to display           Assessment & Plan:  Rebecca Gutierrez is an 82 year old female patient with a past medical history of GERD, hiatal hernia, hyperlipidemia and hypertension presenting to the pulmonary clinic as a referral from her primary  care physician for chronic cough.  # Shortness of breath and chronic cough > 8 weeks #GERD  #Large Hiatal Hernia  My impression is that her shortness of breath is from chronic inflammation in the lungs secondary to ongoing acid reflux possibly leading to lung scarring which are the reticular opacities were seen on chest x-ray.  However reactive airway disease cannot be ruled out and PFTs are indicated.  To better characterize those reticular opacities and rule out interstitial lung disease including UIP and high-resolution CT is required. Will also trial her on low-dose Symbicort and assess response.  However my impression is that this is all secondary to her large hiatal hernia.  And now she is on maximal  acid suppressive medications including pantoprazole and Pepcid.  Should she not respond referral for Nissen fundoplication might be necessary.  I see this was discussed by her gastroenterologist during her last visit.  []  PFTs []  High Resolution CT chest  []  Agree PPI and H2 blocker  []  Might consider low dose Gabapentin if referral for fundoplication is declined and depending on the above.   Return in about 3 months (around 06/10/2023).  I spent 60 minutes caring for this patient today, including preparing to see the patient, obtaining a medical history , reviewing a separately obtained history, performing a medically appropriate examination and/or evaluation, ordering medications, tests, or procedures, referring and communicating with other health care professionals (not separately reported), documenting clinical information in the electronic health record, and independently interpreting results (not separately reported/billed) and communicating results to the patient/family/caregiver  Janann Colonel, MD [facility] 03/11/2023 3:41 PM

## 2023-03-24 ENCOUNTER — Other Ambulatory Visit: Payer: Self-pay | Admitting: Physician Assistant

## 2023-03-24 DIAGNOSIS — R0602 Shortness of breath: Secondary | ICD-10-CM

## 2023-03-25 NOTE — Telephone Encounter (Signed)
OK to refill med 

## 2023-04-05 ENCOUNTER — Telehealth: Payer: Self-pay | Admitting: Pulmonary Disease

## 2023-04-05 NOTE — Telephone Encounter (Signed)
Patient is calling for a CT Scan. She was last seen by Dr. Larinda Buttery on 03/11/23 and was told someone would be scheduling her a scan. If possible she would like it done at the [facility].   She also needs a PFT through [facility].

## 2023-04-05 NOTE — Telephone Encounter (Signed)
I have spoke with [name] and her CT has [facility] [place] on 04/08/23 @ 11:00am. Her PFT has been scheduled on 04/13/23 @ 11:00am at [facility] Entrance

## 2023-04-08 ENCOUNTER — Ambulatory Visit
Admission: RE | Admit: 2023-04-08 | Discharge: 2023-04-08 | Disposition: A | Discharge status: Home / Self Care | Payer: Medicare Other | Source: Ambulatory Visit | Attending: Pulmonary Disease | Admitting: Pulmonary Disease

## 2023-04-08 DIAGNOSIS — R053 Chronic cough: Secondary | ICD-10-CM | POA: Diagnosis present

## 2023-04-13 ENCOUNTER — Ambulatory Visit: Payer: Medicare Other | Attending: Pulmonary Disease

## 2023-04-13 DIAGNOSIS — R0609 Other forms of dyspnea: Secondary | ICD-10-CM | POA: Diagnosis not present

## 2023-04-13 DIAGNOSIS — R053 Chronic cough: Secondary | ICD-10-CM | POA: Insufficient documentation

## 2023-04-13 LAB — PULMONARY FUNCTION TEST ARMC ONLY
DL/VA % pred: 51 %
DL/VA: 2.16 ml/min/mmHg/L
DLCO unc % pred: 44 %
DLCO unc: 7.49 ml/min/mmHg
FEF 25-75 Post: 1.99 L/s
FEF 25-75 Pre: 2 L/s
FEF2575-%Change-Post: 0 %
FEF2575-%Pred-Post: 160 %
FEF2575-%Pred-Pre: 161 %
FEV1-%Change-Post: 0 %
FEV1-%Pred-Post: 112 %
FEV1-%Pred-Pre: 112 %
FEV1-Post: 1.87 L
FEV1-Pre: 1.86 L
FEV1FVC-%Change-Post: 1 %
FEV1FVC-%Pred-Pre: 113 %
FEV6-%Change-Post: -1 %
FEV6-%Pred-Post: 103 %
FEV6-%Pred-Pre: 104 %
FEV6-Post: 2.19 L
FEV6-Pre: 2.22 L
FEV6FVC-%Pred-Post: 106 %
FEV6FVC-%Pred-Pre: 106 %
FVC-%Change-Post: 0 %
FVC-%Pred-Post: 97 %
FVC-%Pred-Pre: 98 %
FVC-Post: 2.2 L
FVC-Pre: 2.22 L
Post FEV1/FVC ratio: 85 %
Post FEV6/FVC ratio: 100 %
Pre FEV1/FVC ratio: 84 %
Pre FEV6/FVC Ratio: 100 %
RV % pred: 81 %
RV: 1.82 L
TLC % pred: 83 %
TLC: 3.83 L

## 2023-04-13 MED ORDER — ALBUTEROL SULFATE (2.5 MG/3ML) 0.083% IN NEBU
2.5000 mg | INHALATION_SOLUTION | Freq: Once | RESPIRATORY_TRACT | Status: AC
  Administered 2023-04-13: 2.5 mg via RESPIRATORY_TRACT
  Filled 2023-04-13: qty 3

## 2023-04-23 ENCOUNTER — Other Ambulatory Visit: Payer: Self-pay | Admitting: Physician Assistant

## 2023-04-23 ENCOUNTER — Telehealth: Payer: Self-pay | Admitting: Pulmonary Disease

## 2023-04-23 ENCOUNTER — Encounter: Payer: Self-pay | Admitting: Pulmonary Disease

## 2023-04-23 DIAGNOSIS — K2100 Gastro-esophageal reflux disease with esophagitis, without bleeding: Secondary | ICD-10-CM

## 2023-04-23 DIAGNOSIS — J849 Interstitial pulmonary disease, unspecified: Secondary | ICD-10-CM

## 2023-04-23 NOTE — Telephone Encounter (Signed)
Adding autoimmune work up

## 2023-04-26 NOTE — Telephone Encounter (Signed)
Patient would like to know if she needs to fast for labs. Patient phone number is [phone].

## 2023-04-26 NOTE — Telephone Encounter (Signed)
I spoke with the patient and told her she does not have to fast for the labs. She will go to the [facility] draw station in [place] to have these done. I have released the labs.  Nothing further needed.

## 2023-04-27 ENCOUNTER — Telehealth: Payer: Self-pay | Admitting: Pulmonary Disease

## 2023-04-27 NOTE — Telephone Encounter (Signed)
Pt calling to get scheduled for her Echocardiogram

## 2023-04-30 LAB — HYPERSENSITIVITY PNEUMONITIS
A. Pullulans Abs: NEGATIVE
A.Fumigatus #1 Abs: NEGATIVE
Micropolyspora faeni, IgG: NEGATIVE
Pigeon Serum Abs: NEGATIVE
Thermoact. Saccharii: NEGATIVE
Thermoactinomyces vulgaris, IgG: NEGATIVE

## 2023-05-03 ENCOUNTER — Ambulatory Visit (INDEPENDENT_AMBULATORY_CARE_PROVIDER_SITE_OTHER): Payer: Medicare Other | Admitting: Internal Medicine

## 2023-05-03 ENCOUNTER — Encounter: Payer: Self-pay | Admitting: Internal Medicine

## 2023-05-03 VITALS — BP 124/74 | HR 77 | Temp 97.9°F | Ht 62.0 in | Wt 127.0 lb

## 2023-05-03 DIAGNOSIS — K219 Gastro-esophageal reflux disease without esophagitis: Secondary | ICD-10-CM

## 2023-05-03 DIAGNOSIS — Z23 Encounter for immunization: Secondary | ICD-10-CM

## 2023-05-03 DIAGNOSIS — R053 Chronic cough: Secondary | ICD-10-CM | POA: Diagnosis not present

## 2023-05-03 DIAGNOSIS — I10 Essential (primary) hypertension: Secondary | ICD-10-CM | POA: Diagnosis not present

## 2023-05-03 DIAGNOSIS — M25561 Pain in right knee: Secondary | ICD-10-CM | POA: Diagnosis not present

## 2023-05-03 NOTE — Assessment & Plan Note (Signed)
Being evaluated by Pulmonary On Symbicort bid On PPI and H2 blocker ANA + - awaiting reflex result

## 2023-05-03 NOTE — Assessment & Plan Note (Signed)
Seeing GI for reflux and HH On PPI bid and H2 blocker No red flag s/s - still has chronic cough

## 2023-05-03 NOTE — Progress Notes (Signed)
Date:  05/03/2023   Name:  Rebecca Gutierrez   DOB:  1940/12/12   MRN:  [id number]   Chief Complaint: Hypertension  Hypertension This is a chronic problem. The problem is controlled. Pertinent negatives include no chest pain, headaches, palpitations or shortness of breath. Past treatments include beta blockers and diuretics. The current treatment provides significant improvement. There are no compliance problems.   Cough This is a chronic problem. The current episode started more than 1 year ago. The problem has been unchanged. The cough is Non-productive. Pertinent negatives include no chest pain, chills, headaches, shortness of breath or wheezing. She has tried a beta-agonist inhaler and steroid inhaler (PPI and H2 blocker) for the symptoms. The treatment provided no relief.  Knee Pain  The incident occurred more than 1 week ago. The incident occurred at home. The injury mechanism was a fall and a twisting injury. The pain is present in the right knee. The quality of the pain is described as aching. The pain is at a severity of 1/10. The pain is mild. The pain has been Improving since onset.  GERD - followed by GI, on medications. Treatment has not reduced cough.  Review of Systems  Constitutional:  Positive for unexpected weight change (as lost some weight due to cutting out nighttime snacks). Negative for chills and fatigue.  HENT:  Positive for voice change. Negative for nosebleeds and trouble swallowing.   Eyes:  Negative for visual disturbance.  Respiratory:  Positive for cough. Negative for chest tightness, shortness of breath and wheezing.   Cardiovascular:  Negative for chest pain, palpitations and leg swelling.  Gastrointestinal:  Negative for abdominal pain, constipation and diarrhea.  Neurological:  Negative for dizziness, weakness, light-headedness and headaches.  Psychiatric/Behavioral:  Negative for dysphoric mood and sleep disturbance. The patient is not nervous/anxious.       Lab Results  Component Value Date   NA 138 10/30/2022   K 4.2 10/30/2022   CO2 24 10/30/2022   GLUCOSE 109 (H) 10/30/2022   BUN 18 10/30/2022   CREATININE 0.86 10/30/2022   CALCIUM 9.6 10/30/2022   EGFR 68 10/30/2022   GFRNONAA 78 07/22/2020   Lab Results  Component Value Date   CHOL 163 10/30/2022   HDL 57 10/30/2022   LDLCALC 89 10/30/2022   TRIG 94 10/30/2022   CHOLHDL 2.9 10/30/2022   Lab Results  Component Value Date   TSH 2.980 10/30/2022   Lab Results  Component Value Date   HGBA1C 5.8 (H) 10/30/2022   Lab Results  Component Value Date   WBC 4.9 10/30/2022   HGB 13.7 10/30/2022   HCT 42.3 10/30/2022   MCV 89 10/30/2022   PLT 161 10/30/2022   Lab Results  Component Value Date   ALT 32 10/30/2022   AST 35 10/30/2022   ALKPHOS 73 10/30/2022   BILITOT 1.4 (H) 10/30/2022   Lab Results  Component Value Date   VD25OH 32.2 04/01/2016     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   Disorder of bone and cartilage 06/28/2009   Hyperlipidemia LDL goal <100 06/02/2007   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   Hearing loss 06/02/2007   Essential hypertension,  benign 06/02/2007    Allergies  Allergen Reactions   Ciprofloxacin Other (See Comments)    Tendinitis    Levaquin [Levofloxacin] Other (See Comments)    insomnia   Potassium Clavulanate [Clavulanic Acid] Diarrhea   [name] Diarrhea   [name] Palpitations and Rash    Last reaction was in 1969    Past Surgical History:  Procedure Laterality Date   ANTERIOR AND POSTERIOR REPAIR N/A 05/02/2013   Procedure: ANTERIOR (CYSTOCELE) AND POSTERIOR  REPAIR (RECTOCELE);  Surgeon: [name], MD;  Location: [facility];  Service: Gynecology;  Laterality: N/A;   BRAVO PH STUDY N/A 10/21/2021   Procedure: BRAVO PH STUDY;  Surgeon: Midge Minium, MD;  Location: [facility] ENDOSCOPY;  Service: Endoscopy;  Laterality: N/A;   CATARACT EXTRACTION, BILATERAL     CHOLECYSTECTOMY  10/15/2020   COLONOSCOPY  08/28/2011   Procedure: COLONOSCOPY;  Surgeon: Malissa Hippo, MD;  Location: [facility];  Service: Endoscopy;  Laterality: N/A;  930   ESOPHAGOGASTRODUODENOSCOPY N/A 10/21/2021   Procedure: ESOPHAGOGASTRODUODENOSCOPY (EGD);  Surgeon: Midge Minium, MD;  Location: [facility] ENDOSCOPY;  Service: Endoscopy;  Laterality: N/A;   EYE SURGERY Bilateral 02/13/2011   other 2013   MOHS SURGERY  06/2016   Removed skin cancer from Rt shin   TUBAL LIGATION     VAGINAL HYSTERECTOMY N/A 05/02/2013   Procedure: HYSTERECTOMY VAGINAL;  Surgeon: [name], MD;  Location: [facility];  Service: Gynecology;  Laterality: N/A;    Social History   Tobacco Use   Smoking status: Never   Smokeless tobacco: Never  Vaping Use   Vaping status: Never Used  Substance Use Topics   Alcohol use: Yes    Alcohol/week: 1.0 standard drink of alcohol    Types: 1 Standard drinks or equivalent per week   Drug use: Never     Medication list has been reviewed and updated.  Current Meds  Medication Sig   aspirin EC 81 MG tablet Take 81 mg by mouth at bedtime.    budesonide-formoterol (SYMBICORT) 80-4.5 MCG/ACT inhaler Inhale 2 puffs into the lungs in the morning and at bedtime.   Cholecalciferol 50 MCG (2000 UT) TABS Take 1 capsule by mouth in the morning.   estradiol (ESTRACE VAGINAL) 0.1 MG/GM vaginal cream Apply 0.5mg  (pea-sized amount)  just inside the vaginal introitus with a finger-tip on Monday, Wednesday and Friday nights.   famotidine (PEPCID) 20 MG tablet TAKE 1 TABLET BY MOUTH TWICE  DAILY   hydrochlorothiazide (HYDRODIURIL) 25 MG tablet TAKE 1 TABLET BY MOUTH DAILY    metoprolol tartrate (LOPRESSOR) 25 MG tablet TAKE 1 AND 1/2 TABLETS BY MOUTH  TWICE DAILY   nitrofurantoin (MACRODANTIN) 100 MG capsule Take 100 mg by mouth at bedtime.   pantoprazole (PROTONIX) 40 MG tablet TAKE 1 TABLET BY MOUTH TWICE  DAILY   simvastatin (ZOCOR) 10 MG tablet Take 1 tablet (10 mg total) by mouth daily.       05/03/2023    9:59 AM 03/02/2023   10:16 AM 12/31/2022   11:18 AM 10/30/2022    9:32 AM  GAD 7 : Generalized Anxiety Score  Nervous, Anxious, on Edge 0 0 0 0  Control/stop worrying 0 0 0 0  Worry too much - different things 0 0 0 0  Trouble relaxing 0 0 0 0  Restless 0 0 0 0  Easily annoyed or irritable 0 0 0 0  Afraid - awful might happen 0 0 0 0  Total GAD 7 Score 0  0 0 0  Anxiety Difficulty Not difficult at all Not difficult at all Not difficult at all Not difficult at all       05/03/2023    9:59 AM 03/02/2023   10:16 AM 12/31/2022   11:18 AM  Depression screen PHQ 2/9  Decreased Interest 0 0 0  Down, Depressed, Hopeless 0 0 0  PHQ - 2 Score 0 0 0  Altered sleeping 0 0 1  Tired, decreased energy 0 0 0  Change in appetite 0 0 0  Feeling bad or failure about yourself  0 0 0  Trouble concentrating 0 0 0  Moving slowly or fidgety/restless 0 0 0  Suicidal thoughts 0 0 0  PHQ-9 Score 0 0 1  Difficult doing work/chores Not difficult at all Not difficult at all Not difficult at all    BP Readings from Last 3 Encounters:  05/03/23 124/74  03/11/23 136/60  03/02/23 110/74    Physical Exam Vitals and nursing note reviewed.  Constitutional:      General: She is not in acute distress.    Appearance: She is well-developed.  HENT:     Head: Normocephalic and atraumatic.  Neck:     Vascular: No carotid bruit.  Cardiovascular:     Rate and Rhythm: Normal rate and regular rhythm.     Pulses: Normal pulses.  Pulmonary:     Effort: Pulmonary effort is normal. No respiratory distress.     Breath sounds: No wheezing or rhonchi.  Abdominal:      General: Abdomen is flat.     Palpations: Abdomen is soft.     Tenderness: There is no abdominal tenderness.  Musculoskeletal:     Cervical back: Normal range of motion.     Right knee: No swelling, deformity or effusion. Tenderness present over the medial joint line.     Left knee: Normal.     Right lower leg: No edema.     Left lower leg: No edema.  Lymphadenopathy:     Cervical: No cervical adenopathy.  Skin:    General: Skin is warm and dry.     Findings: No rash.  Neurological:     Mental Status: She is alert and oriented to person, place, and time.  Psychiatric:        Mood and Affect: Mood normal.        Behavior: Behavior normal.     Wt Readings from Last 3 Encounters:  05/03/23 127 lb (57.6 kg)  03/11/23 128 lb 6.4 oz (58.2 kg)  03/02/23 129 lb (58.5 kg)    BP 124/74 (BP Location: Right Arm, Cuff Size: Normal)   Pulse 77   Temp 97.9 F (36.6 C) (Oral)   Ht 5\' 2"  (1.575 m)   Wt 127 lb (57.6 kg)   SpO2 92%   BMI 23.23 kg/m   Assessment and Plan:  Problem List Items Addressed This Visit       Unprioritized   Essential hypertension, benign - Primary (Chronic)    Controlled BP with normal exam. Current regimen is metoprolol and hctz. Will continue same medications; encourage continued reduced sodium diet.       Gastroesophageal reflux disease (Chronic)    Seeing GI for reflux and HH On PPI bid and H2 blocker No red flag s/s - still has chronic cough      Chronic cough    Being evaluated by Pulmonary On Symbicort bid On PPI and H2 blocker ANA + - awaiting reflex result  Other Visit Diagnoses     Need for influenza vaccination       Relevant Orders   Flu Vaccine Trivalent High Dose (Fluad) (Completed)   Right medial knee pain       recently strained while getting up from the floor improving - no swelling or redness, no weakness continue supportive care       Return in about 6 months (around 10/31/2023) for CPX.    Reubin Milan, MD [facility] [place]

## 2023-05-03 NOTE — Assessment & Plan Note (Signed)
 Controlled BP with normal exam. Current regimen is metoprolol and hctz. Will continue same medications; encourage continued reduced sodium diet.

## 2023-05-10 ENCOUNTER — Telehealth: Payer: Self-pay

## 2023-05-10 ENCOUNTER — Ambulatory Visit: Payer: Medicare Other | Admitting: Urology

## 2023-05-10 ENCOUNTER — Encounter: Payer: Self-pay | Admitting: Urology

## 2023-05-10 VITALS — BP 153/76 | HR 1

## 2023-05-10 DIAGNOSIS — R3989 Other symptoms and signs involving the genitourinary system: Secondary | ICD-10-CM | POA: Diagnosis not present

## 2023-05-10 DIAGNOSIS — N3020 Other chronic cystitis without hematuria: Secondary | ICD-10-CM | POA: Diagnosis not present

## 2023-05-10 DIAGNOSIS — R053 Chronic cough: Secondary | ICD-10-CM

## 2023-05-10 MED ORDER — NITROFURANTOIN MACROCRYSTAL 100 MG PO CAPS: 100.0000 mg | ORAL_CAPSULE | Freq: Every day | ORAL | 3 refills | Status: DC

## 2023-05-10 NOTE — Progress Notes (Signed)
05/10/2023 9:53 AM   Rebecca Gutierrez 26-Mar-1941 [id number]  Referring provider: Reubin Milan, MD [address] [place],  [place] [address]  Chief Complaint  Patient presents with   Follow-up    HPI: 2019 I saw patient in 2019.   She can feel some vaginal bulging but she was actually quite nonspecific about it and has been chronic.  Used to be managed with pessary    Patient has had 5 or 6 bladder infections with typical cystitis symptoms that generally respond to antibiotics.  She does not really have prolapse symptoms otherwise.  She is had a hysterectomy.  She had an ultrasound that demonstrated a residual of 236 mL. Kidneys are normal   1 pelvic examination patient had moderate grade 3 cystocele with central defect. Vaginal cuff descended from 8 or 9 cm to approximately 5 cm. No rectocele. No stress incontinence. Cystocele was just reached the introitus   Residual today 175 mL   Picture drawn. Patient has a relatively asymptomatic cystocele. It could be related to the elevated residual. She understands if she had a transvaginal vault suspension with cystocele repair and graft her residual may improve with a natural history of her bladder infections is likely not going to change. She was getting breakthrough infections on trimethoprim. I suggest to see me back on daily Macrodantin in 2 months and check another residual. It is a lot of surgery to go through hoping to reduce bladder infection frequency. Role of urodynamics not discussed   Patient infection free on Macrodantin but for some reason she thought she should take it 4 times a day   I spent a lot of time clarifying to take it once a day and prescription was renewed 90x3 and see in 4 month   Today Frequency stable.  Patient is very happy on daily Macrodantin.  Prolapse table.  Clinically not infected.  Today Frequency stable.  I last saw the patient 1 year ago.  Patient has seen her nurse practitioner 3  times since my last visit.  She had Klebsiella in the urine at least twice in 2024.  Her renal ultrasound was 2021.  I do not think her prophylaxis was changed though it was discussed.  Discussion about pulmonary issues.  He has a coughing issue since a cholecystectomy and pulmonary are okay on Macrodantin.  She says she is only had 1 breakthrough infection and she would like to stay on it.   PMH: Past Medical History:  Diagnosis Date   Abnormal ECG 10/28/2017   Anxiety    Bradycardia 11/10/2017   Cancer (HCC)    skin ca   Cystocele 09/13/2013   small recurrent, less than before 09/13/13 JVF    Gilbert's syndrome    Hearing loss 1975   History of recurrent urinary tract infection 03/24/2016   Hyperlipidemia 2000   Hypertension 2000   Recurrent UTI 03/10/2015    Surgical History: Past Surgical History:  Procedure Laterality Date   ANTERIOR AND POSTERIOR REPAIR N/A 05/02/2013   Procedure: ANTERIOR (CYSTOCELE) AND POSTERIOR REPAIR (RECTOCELE);  Surgeon: [name], MD;  Location: [facility];  Service: Gynecology;  Laterality: N/A;   BRAVO PH STUDY N/A 10/21/2021   Procedure: BRAVO PH STUDY;  Surgeon: Midge Minium, MD;  Location: [facility] ENDOSCOPY;  Service: Endoscopy;  Laterality: N/A;   CATARACT EXTRACTION, BILATERAL     CHOLECYSTECTOMY  10/15/2020   COLONOSCOPY  08/28/2011   Procedure: COLONOSCOPY;  Surgeon: Malissa Hippo, MD;  [facility];  Service: Endoscopy;  Laterality: N/A;  930   ESOPHAGOGASTRODUODENOSCOPY N/A 10/21/2021   Procedure: ESOPHAGOGASTRODUODENOSCOPY (EGD);  Surgeon: Midge Minium, MD;  Location: [facility] ENDOSCOPY;  Service: Endoscopy;  Laterality: N/A;   EYE SURGERY Bilateral 02/13/2011   other 2013   MOHS SURGERY  06/2016   Removed skin cancer from Rt shin   TUBAL LIGATION     VAGINAL HYSTERECTOMY N/A 05/02/2013   Procedure: HYSTERECTOMY VAGINAL;  Surgeon: [name], MD;  Location: [facility];  Service: Gynecology;  Laterality: N/A;    Home Medications:   Allergies as of 05/10/2023       Reactions   Ciprofloxacin Other (See Comments)   Tendinitis    Levaquin [levofloxacin] Other (See Comments)   insomnia   Potassium Clavulanate [clavulanic Acid] Diarrhea   [name] Diarrhea   [name] Palpitations, Rash   Last reaction was in 1969        Medication List        Accurate as of May 10, 2023  9:53 AM. If you have any questions, ask your nurse or doctor.          aspirin EC 81 MG tablet Take 81 mg by mouth at bedtime.   budesonide-formoterol 80-4.5 MCG/ACT inhaler Commonly known as: Symbicort Inhale 2 puffs into the lungs in the morning and at bedtime.   Cholecalciferol 50 MCG (2000 UT) Tabs Take 1 capsule by mouth in the morning.   estradiol 0.1 MG/GM vaginal cream Commonly known as: ESTRACE VAGINAL Apply 0.5mg  (pea-sized amount)  just inside the vaginal introitus with a finger-tip on Monday, Wednesday and Friday nights.   famotidine 20 MG tablet Commonly known as: PEPCID TAKE 1 TABLET BY MOUTH TWICE  DAILY   hydrochlorothiazide 25 MG tablet Commonly known as: HYDRODIURIL TAKE 1 TABLET BY MOUTH DAILY   metoprolol tartrate 25 MG tablet Commonly known as: LOPRESSOR TAKE 1 AND 1/2 TABLETS BY MOUTH  TWICE DAILY   nitrofurantoin 100 MG capsule Commonly known as: MACRODANTIN Take 100 mg by mouth at bedtime.   pantoprazole 40 MG tablet Commonly known as: PROTONIX TAKE 1 TABLET BY MOUTH TWICE  DAILY   simvastatin 10 MG tablet Commonly known as: ZOCOR Take 1 tablet (10 mg total) by mouth daily.        Allergies:  Allergies  Allergen Reactions   Ciprofloxacin Other (See Comments)    Tendinitis    Levaquin [Levofloxacin] Other (See Comments)    insomnia   Potassium Clavulanate [Clavulanic Acid] Diarrhea   [name] Diarrhea   [name] Palpitations and Rash    Last reaction was in 1969    Family History: Family History  Problem  Relation Age of Onset   Hypertension Mother    Cancer Mother        stomach   Healthy Father    Healthy Brother    Colon cancer Neg Hx    Prostate cancer Neg Hx    Kidney cancer Neg Hx    Bladder Cancer Neg Hx    Breast cancer Neg Hx     Social History:  reports that she has never smoked. She has never used smokeless tobacco. She reports current alcohol use of about 1.0 standard drink of alcohol per week. She reports that she does not use drugs.  ROS:  Physical Exam: There were no vitals taken for this visit.  Constitutional:  Alert and oriented, No acute distress. HEENT: [place] AT, moist mucus membranes.  Trachea midline, no masses.  Laboratory Data: Lab Results  Component Value Date   WBC 4.9 10/30/2022   HGB 13.7 10/30/2022   HCT 42.3 10/30/2022   MCV 89 10/30/2022   PLT 161 10/30/2022    Lab Results  Component Value Date   CREATININE 0.86 10/30/2022    No results found for: "PSA"  No results found for: "TESTOSTERONE"  Lab Results  Component Value Date   HGBA1C 5.8 (H) 10/30/2022    Urinalysis    Component Value Date/Time   COLORURINE YELLOW 10/26/2022 1045   APPEARANCEUR Hazy (A) 02/12/2023 1136   LABSPEC 1.015 10/26/2022 1045   PHURINE 6.5 10/26/2022 1045   GLUCOSEU Negative 02/12/2023 1136   HGBUR TRACE (A) 10/26/2022 1045   BILIRUBINUR Negative 02/12/2023 1136   KETONESUR NEGATIVE 10/26/2022 1045   PROTEINUR Negative 02/12/2023 1136   PROTEINUR NEGATIVE 10/26/2022 1045   UROBILINOGEN 0.2 10/21/2022 1038   UROBILINOGEN 0.2 05/02/2013 0930   NITRITE Negative 02/12/2023 1136   NITRITE NEGATIVE 10/26/2022 1045   LEUKOCYTESUR 1+ (A) 02/12/2023 1136   LEUKOCYTESUR SMALL (A) 10/26/2022 1045    Pertinent Imaging: No urine but clinically not infected  Assessment & Plan: 90 x 3 Macrodantin sent to pharmacy and see in a year.  She understands he gets too many breakthrough infections we will  switch her and she agreed  1. Chronic cystitis  - Urinalysis, Complete   No follow-ups on file.  Martina Sinner, MD  [facility] [address] [place], [place] [address] [phone]

## 2023-05-10 NOTE — Addendum Note (Signed)
Addended by: Janann Colonel on: 05/10/2023 12:02 PM   Modules accepted: Orders

## 2023-05-10 NOTE — Telephone Encounter (Signed)
Urgent Referral has been placed.  Nothing further needed.

## 2023-05-10 NOTE — Addendum Note (Signed)
Addended byRanda Lynn on: 05/10/2023 10:04 AM   Modules accepted: Orders

## 2023-05-10 NOTE — Telephone Encounter (Signed)
-----   Message from Janann Colonel sent at 05/10/2023 12:02 PM EST ----- Regarding: Referral To rheumatology Hi Team,  I ordered a referral to rheumatology at [facility]. Can we please put it as urgent. Thanks.

## 2023-05-16 NOTE — Progress Notes (Unsigned)
Rebecca Amy, PA-C [address]  [address]  [place], [place] [address]  Main: [phone]  Fax: [phone]   Primary Care Physician: Rebecca Milan, MD  Primary Gastroenterologist:  Rebecca Amy, PA-C / Dr. Midge Minium    CC:  F/U Chronic Cough, GERD, Hiatal Hernia  HPI: Rebecca Gutierrez is a 82 y.o. female, established patient Dr. Servando Snare, returns for follow-up of chronic GERD, large hiatal hernia, and worsening chronic dry cough for 1 year.  Currently taking maximum therapy for GERD including pantoprazole 40 Mg twice daily and Pepcid 20 Mg twice daily.  Lisinopril ACE inhibitor was stopped few months ago.  Patient noted increased shortness of breath with exertion.  Referred to pulmonology.  She denies heartburn, chest pain, acid taste in her throat, or regurgitation of acid.  02/2023: Chest x-ray showed diffuse reticular densities throughout both lungs.  She had pulmonology evaluation with Dr. Larinda Buttery.  His impression was that her chronic lung inflammation was secondary to ongoing acid reflux due to large hiatal hernia.  She had chest CT, labs, echocardiogram, pulmonary function tests.  Chest CT showed small to borderline enlarged mediastinal lymph nodes concerning for sarcoidosis.  Labs showed positive ANA speckled pattern and she was referred to rheumatologist for further evaluation.  Echocardiogram has been ordered, but not yet completed.  -12/2022: Normal laryngoscopy by ENT. -10/2021 Bravo Test: Showed many episodes of acid reflux.  -10/21/2021 EGD: showed a large 10 cm hiatal hernia, normal stomach and normal duodenum.  Esophageal biopsies were negative for Barrett's esophagus.  Mild chronic esophagitis.    Current Outpatient Medications  Medication Sig Dispense Refill   aspirin EC 81 MG tablet Take 81 mg by mouth at bedtime.      budesonide-formoterol (SYMBICORT) 80-4.5 MCG/ACT inhaler Inhale 2 puffs into the lungs in the morning and at bedtime. 1 each 12    Cholecalciferol 50 MCG (2000 UT) TABS Take 1 capsule by mouth in the morning.     estradiol (ESTRACE VAGINAL) 0.1 MG/GM vaginal cream Apply 0.5mg  (pea-sized amount)  just inside the vaginal introitus with a finger-tip on Monday, Wednesday and Friday nights. 30 g 0   famotidine (PEPCID) 20 MG tablet TAKE 1 TABLET BY MOUTH TWICE  DAILY 180 tablet 0   hydrochlorothiazide (HYDRODIURIL) 25 MG tablet TAKE 1 TABLET BY MOUTH DAILY 90 tablet 3   metoprolol tartrate (LOPRESSOR) 25 MG tablet TAKE 1 AND 1/2 TABLETS BY MOUTH  TWICE DAILY 270 tablet 1   nitrofurantoin (MACRODANTIN) 100 MG capsule Take 1 capsule (100 mg total) by mouth at bedtime. 90 capsule 3   simvastatin (ZOCOR) 10 MG tablet Take 1 tablet (10 mg total) by mouth daily. 90 tablet 3   Vonoprazan Fumarate (VOQUEZNA) 20 MG TABS Take 20 mg by mouth daily.     No current facility-administered medications for this visit.    Allergies as of 05/17/2023 - Review Complete 05/17/2023  Allergen Reaction Noted   Ciprofloxacin Other (See Comments) 05/27/2018   Levaquin [levofloxacin] Other (See Comments) 05/11/2017   Potassium clavulanate [clavulanic acid] Diarrhea 10/21/2022   [name] Diarrhea 06/24/2015   [name] Palpitations and Rash 06/02/2007    Past Medical History:  Diagnosis Date   Abnormal ECG 10/28/2017   Anxiety    Bradycardia 11/10/2017   Cancer (HCC)    skin ca   Cystocele 09/13/2013   small recurrent, less than before 09/13/13 JVF    Gilbert's syndrome    Hearing loss 1975   History of recurrent  urinary tract infection 03/24/2016   Hyperlipidemia 2000   Hypertension 2000   Recurrent UTI 03/10/2015    Past Surgical History:  Procedure Laterality Date   ANTERIOR AND POSTERIOR REPAIR N/A 05/02/2013   Procedure: ANTERIOR (CYSTOCELE) AND POSTERIOR REPAIR (RECTOCELE);  Surgeon: Rebecca Burrow, MD;  Location: [facility];  Service: Gynecology;  Laterality: N/A;   BRAVO PH STUDY N/A 10/21/2021    Procedure: BRAVO PH STUDY;  Surgeon: Midge Minium, MD;  Location: [facility] ENDOSCOPY;  Service: Endoscopy;  Laterality: N/A;   CATARACT EXTRACTION, BILATERAL     CHOLECYSTECTOMY  10/15/2020   COLONOSCOPY  08/28/2011   Procedure: COLONOSCOPY;  Surgeon: Rebecca Hippo, MD;  Location: [facility] ENDO SUITE;  Service: Endoscopy;  Laterality: N/A;  930   ESOPHAGOGASTRODUODENOSCOPY N/A 10/21/2021   Procedure: ESOPHAGOGASTRODUODENOSCOPY (EGD);  Surgeon: Midge Minium, MD;  Location: [facility] ENDOSCOPY;  Service: Endoscopy;  Laterality: N/A;   EYE SURGERY Bilateral 02/13/2011   other 2013   MOHS SURGERY  06/2016   Removed skin cancer from Rt shin   TUBAL LIGATION     VAGINAL HYSTERECTOMY N/A 05/02/2013   Procedure: HYSTERECTOMY VAGINAL;  Surgeon: Rebecca Burrow, MD;  Location: [facility]S;  Service: Gynecology;  Laterality: N/A;    Review of Systems:    All systems reviewed and negative except where noted in HPI.   Physical Examination:   BP (!) 143/75 (BP Location: Left Arm, Patient Position: Sitting, Cuff Size: Normal)   Pulse 69   Temp 98.4 F (36.9 C) (Oral)   Ht 5\' 1"  (1.549 m)   Wt 126 lb 6.4 oz (57.3 kg)   BMI 23.88 kg/m   General: Well-nourished, well-developed in no acute distress.  Lungs: Clear to auscultation bilaterally. Non-labored. Heart: Regular rate and rhythm, no murmurs rubs or gallops.  Abdomen: Bowel sounds are normal; Abdomen is Soft; No hepatosplenomegaly, masses or hernias;  No Abdominal Tenderness; No guarding or rebound tenderness. Neuro: Alert and oriented x 3.  Grossly intact.  Psych: Alert and cooperative, normal mood and affect.   Imaging Studies: No results found.  Assessment and Plan:   [name] is a 82 y.o. y/o female returns for follow-up of chronic cough for 1 or 2 years worsening which may be multifactorial.  She has a known large hiatal hernia and silent acid reflux.  Differential for chronic cough includes LPR (not controlled on high-dose PPI plus H2 RB),  sarcoidosis, and interstitial lung disease, CHF.  I am changing her PPI from pantoprazole to Foquest to see if this will help her cough.  Encouraged her to continue follow-up with pulmonology, echocardiogram, and rheumatology.  Lengthy discussion regarding fundoplication surgery for hiatal hernia.  Printed education handout was given.  We are happy to refer to general surgeon to discuss hiatal hernia repair if pulmonology evaluation and echocardiogram are unrevealing and she continues to have cough.  1.  Chronic cough and dyspnea with exertion  Chest CT concerning for possible sarcoidosis  Continue follow-up with pulmonology  Continue with plan for echocardiogram  2.  Chronic GERD; laryngeal pharyngeal reflux; silent reflux.  Stop pantoprazole 40 Mg twice daily  Start Voquezna 20mg  1 capsule once daily - Samples Given.  Continue Pepcid 20 Mg twice daily  Continue avoiding GERD trigger foods and drinks  If Voquezna does not work, then try Dexilant 60mg  daily.  3.  Large hiatal hernia  May ultimately need referral to general surgeon to discuss fundoplication surgery after she has completed pulmonary, cardiac, and rheumatology evaluations.  4.  Positive ANA speckled pattern  Follow-up with rheumatology   Rebecca Amy, PA-C  Follow up 5 Weeks.

## 2023-05-17 ENCOUNTER — Ambulatory Visit (INDEPENDENT_AMBULATORY_CARE_PROVIDER_SITE_OTHER): Payer: Medicare Other | Admitting: Physician Assistant

## 2023-05-17 ENCOUNTER — Encounter: Payer: Self-pay | Admitting: Physician Assistant

## 2023-05-17 VITALS — BP 143/75 | HR 69 | Temp 98.4°F | Ht 61.0 in | Wt 126.4 lb

## 2023-05-17 DIAGNOSIS — R0602 Shortness of breath: Secondary | ICD-10-CM

## 2023-05-17 DIAGNOSIS — K449 Diaphragmatic hernia without obstruction or gangrene: Secondary | ICD-10-CM | POA: Diagnosis not present

## 2023-05-17 DIAGNOSIS — R053 Chronic cough: Secondary | ICD-10-CM

## 2023-05-17 DIAGNOSIS — K219 Gastro-esophageal reflux disease without esophagitis: Secondary | ICD-10-CM

## 2023-05-17 DIAGNOSIS — K2100 Gastro-esophageal reflux disease with esophagitis, without bleeding: Secondary | ICD-10-CM

## 2023-05-17 MED ORDER — VOQUEZNA 20 MG PO TABS: 20.0000 mg | ORAL_TABLET | Freq: Every day | ORAL | Status: DC

## 2023-05-20 ENCOUNTER — Ambulatory Visit: Payer: Medicare Other | Admitting: Pulmonary Disease

## 2023-05-20 ENCOUNTER — Encounter: Payer: Self-pay | Admitting: Pulmonary Disease

## 2023-05-20 VITALS — BP 136/70 | HR 70 | Temp 97.6°F | Ht 61.0 in | Wt 126.0 lb

## 2023-05-20 DIAGNOSIS — M351 Other overlap syndromes: Secondary | ICD-10-CM | POA: Diagnosis not present

## 2023-05-20 NOTE — Progress Notes (Signed)
Synopsis: Referred in by Reubin Milan, MD   Subjective:   PATIENT ID: Rebecca Gutierrez GENDER: female DOB: [birth year], MRN: [id number]  Chief Complaint  Patient presents with   Follow-up    Cough mainly when she talks. Occasional cough with clear phlegm. Pt has seen GI and was started on Voquenzna 2 days ago.     HPI Rebecca Gutierrez is an 82 year old female patient with a past medical history of GERD, hiatal hernia, hyperlipidemia and hypertension presenting to the pulmonary clinic as a referral from her primary care physician for chronic cough.  She said that back in May her cough started to worsen now it is soon as she vocalizes she starts coughing her is dry and associated with shortness of breath specifically on exertion.  She denies any heartburn denies acid taste in mouth and denies postnasal drip.  She denies any history of asthma or any trigger to the cough.  She denies any chest wheezing or chest tightness.  She saw ENT specialist underwent a laryngoscopy did not find any abnormalities.  She does follow-up with a gastroenterologist and have her on Protonix 40 mg twice daily as well as famotidine 20 mg twice daily.  PFTs with normal spirometry, normal lung volumes and moderate to severely reduced DLCO. Suggestive of PVD. PEFR was increased consistent with possible ILD.   High res CT chest 04/08/2023 - Small to moderate enlarged meds LN  with diffuse perilymphatic nodularity.   ANA 1:1280 - ScL70 48 - Anti sm 76 - Anti U1 RNP 76    She denies any systemic symptoms including dysphagia, raynaud, joint issues, dry mouth or eyes. Denies any rashes.   Family history she denies any family history of pulmonary diseases.  Social history never smoker drinks alcohol occasionally no illicit drug use and no pets at home.  ROS All systems were reviewed and are negative except for the above.  Objective:   Vitals:   05/20/23 1118  BP: 136/70  Pulse: 70  Temp: 97.6 F (36.4 C)   TempSrc: Temporal  SpO2: 96%  Weight: 126 lb (57.2 kg)  Height: 5\' 1"  (1.549 m)   96% on RA BMI Readings from Last 3 Encounters:  05/20/23 23.81 kg/m  05/17/23 23.88 kg/m  05/03/23 23.23 kg/m   Wt Readings from Last 3 Encounters:  05/20/23 126 lb (57.2 kg)  05/17/23 126 lb 6.4 oz (57.3 kg)  05/03/23 127 lb (57.6 kg)    Physical Exam GEN: NAD, Healthy Appearing HEENT: Supple Neck, Reactive Pupils, EOMI  CVS: Normal S1, Normal S2, RRR, No murmurs or ES appreciated  Lungs: Clear bilateral air entry.  Abdomen: Soft, non tender, non distended, + BS  Extremities: Warm and well perfused, No edema  Skin: No suspicious lesions appreciated  Psych: Normal Affect  Ancillary Information   CBC    Component Value Date/Time   WBC 4.9 10/30/2022 1014   WBC 3.4 (L) 03/29/2015 0912   RBC 4.75 10/30/2022 1014   RBC 5 01/13/2018 1147   RBC 4.49 03/29/2015 0912   HGB 13.7 10/30/2022 1014   HCT 42.3 10/30/2022 1014   PLT 161 10/30/2022 1014   MCV 89 10/30/2022 1014   MCV 91 10/13/2011 1515   MCH 28.8 10/30/2022 1014   MCH 31.2 03/29/2015 0912   MCHC 32.4 10/30/2022 1014   MCHC 35.6 03/29/2015 0912   RDW 12.8 10/30/2022 1014   RDW 13.3 10/13/2011 1515   LYMPHSABS 0.9 10/30/2022 1014   MONOABS 0.3 03/29/2015 0912  EOSABS 0.2 10/30/2022 1014   BASOSABS 0.0 10/30/2022 1014       Latest Ref Rng & Units 04/13/2023   11:26 AM  PFT Results  FVC-Pre L 2.22  P  FVC-Predicted Pre % 98  P  FVC-Post L 2.20  P  FVC-Predicted Post % 97  P  Pre FEV1/FVC % % 84  P  Post FEV1/FCV % % 85  P  FEV1-Pre L 1.86  P  FEV1-Predicted Pre % 112  P  FEV1-Post L 1.87  P  DLCO uncorrected ml/min/mmHg 7.49  P  DLCO UNC% % 44  P  DLVA Predicted % 51  P  TLC L 3.83  P  TLC % Predicted % 83  P  RV % Predicted % 81  P    P Preliminary result     Assessment & Plan:  Rebecca Gutierrez is an 82 year old female patient with a past medical history of GERD, hiatal hernia, hyperlipidemia and  hypertension presenting to the pulmonary clinic as a referral from her primary care physician for chronic cough.  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 Pending Echo and cardiology eval. WHO  fc II.   Symptoms include mainly cough with dyspnea on exertion.   []  Referral to rheumatology placed []  Referral to cardiology for RHC placed. Pending Echocardiogram. []  Agree PPI and H2 blocker per GI []  Will await rheumatology's input for immunosuppressive regimen.    Return in about 3 months (around 08/18/2023).  I spent 45 minutes caring for this patient today, including preparing to see the patient, obtaining a medical history , reviewing a separately obtained history, performing a medically appropriate examination and/or evaluation, ordering medications, tests, or procedures, referring and communicating with other health care professionals (not separately reported), documenting clinical information in the electronic health record, and independently interpreting results (not separately reported/billed) and communicating results to the patient/family/caregiver  Janann Colonel, MD [facility] 05/20/2023 12:36 PM

## 2023-05-25 LAB — MYOMARKER 3 PLUS PROFILE (RDL)
Anti-EJ Ab (RDL): NEGATIVE
Anti-Jo-1 Ab (RDL): <20 U (ref <20)
Anti-Ku Ab (RDL): NEGATIVE
Anti-MDA-5 Ab (CADM-140)(RDL): <20 U (ref <20)
Anti-Mi-2 Ab (RDL): NEGATIVE
Anti-NXP-2 (P140) Ab (RDL): <20 U (ref <20)
Anti-OJ Ab (RDL): NEGATIVE
Anti-PL-12 Ab (RDL: NEGATIVE
Anti-PL-7 Ab (RDL): NEGATIVE
Anti-PM/Scl-100 Ab (RDL): <20 U (ref <20)
Anti-SAE1 Ab, IgG (RDL): <20 U (ref <20)
Anti-SRP Ab (RDL): NEGATIVE
Anti-SS-A 52kD Ab, IgG (RDL): <20 U (ref <20)
Anti-TIF-1gamma Ab (RDL): <20 U (ref <20)
Anti-U1 RNP Ab (RDL): 105 U — ABNORMAL HIGH (ref <20)
Anti-U2 RNP Ab (RDL): NEGATIVE
Anti-U3 RNP (Fibrillarin)(RDL): NEGATIVE

## 2023-05-27 LAB — REPEAT ANTI-SCL-70

## 2023-05-27 LAB — ANA 12 PLUS PROFILE, POSITIVE
Anti-CCP Ab, IgG & IgA (RDL): <20 U (ref <20)
Anti-Cardiolipin Ab, IgA (RDL): <12 [APL'U]/mL (ref <12)
Anti-Cardiolipin Ab, IgG (RDL): <15 [GPL'U]/mL (ref <15)
Anti-Cardiolipin Ab, IgM (RDL): <13 [MPL'U]/mL (ref <13)
Anti-Centromere Ab (RDL): <1:40 {titer}
Anti-Chromatin Ab, IgG (RDL): <20 U (ref <20)
Anti-La (SS-B) Ab (RDL): <20 U (ref <20)
Anti-Ro (SS-A) Ab (RDL): <20 U (ref <20)
Anti-Scl-70 Ab (RDL): 48 U — ABNORMAL HIGH (ref <20)
Anti-Sm Ab (RDL): 76 U — ABNORMAL HIGH (ref <20)
Anti-TPO Ab (RDL): <9 [IU]/mL (ref <9.0)
Anti-U1 RNP Ab (RDL): 76 U — ABNORMAL HIGH (ref <20)
Anti-dsDNA Ab by Farr(RDL): <8 [IU]/mL (ref <8.0)
C3 Complement (RDL): 209 mg/dL — ABNORMAL HIGH (ref 82–167)
C4 Complement (RDL): 17 mg/dL (ref 14–44)
Rheumatoid Factor by Turb RDL: <14 [IU]/mL (ref <14)
Speckled Pattern: >1:1280 {titer} — AB

## 2023-05-27 LAB — ANA 12 PLUS PROFILE (RDL): Anti-Nuclear Ab by IFA (RDL): POSITIVE — AB

## 2023-05-31 ENCOUNTER — Ambulatory Visit
Admission: RE | Admit: 2023-05-31 | Discharge: 2023-05-31 | Disposition: A | Discharge status: Home / Self Care | Payer: Medicare Other | Source: Ambulatory Visit | Attending: Pulmonary Disease | Admitting: Pulmonary Disease

## 2023-05-31 DIAGNOSIS — I340 Nonrheumatic mitral (valve) insufficiency: Secondary | ICD-10-CM | POA: Insufficient documentation

## 2023-05-31 DIAGNOSIS — R0602 Shortness of breath: Secondary | ICD-10-CM | POA: Diagnosis present

## 2023-05-31 DIAGNOSIS — I371 Nonrheumatic pulmonary valve insufficiency: Secondary | ICD-10-CM | POA: Insufficient documentation

## 2023-05-31 DIAGNOSIS — E785 Hyperlipidemia, unspecified: Secondary | ICD-10-CM | POA: Insufficient documentation

## 2023-05-31 DIAGNOSIS — R0600 Dyspnea, unspecified: Secondary | ICD-10-CM | POA: Insufficient documentation

## 2023-05-31 LAB — ECHOCARDIOGRAM COMPLETE
AR max vel: 2.13 cm2
AV Area VTI: 2.14 cm2
AV Area mean vel: 1.97 cm2
AV Mean grad: 4 mm[Hg]
AV Peak grad: 7.8 mm[Hg]
Ao pk vel: 1.4 m/s
Area-P 1/2: 5.38 cm2
MV M vel: 4.88 m/s
MV Peak grad: 95.3 mm[Hg]
MV VTI: 2.06 cm2
Radius: 0.5 cm
S' Lateral: 2.7 cm

## 2023-05-31 NOTE — Progress Notes (Signed)
*  PRELIMINARY RESULTS* Echocardiogram 2D Echocardiogram has been performed.  Cristela Blue 05/31/2023, 11:30 AM

## 2023-06-04 ENCOUNTER — Other Ambulatory Visit: Payer: Self-pay

## 2023-06-04 ENCOUNTER — Telehealth: Payer: Self-pay

## 2023-06-04 NOTE — Telephone Encounter (Signed)
Patient is taking Voquezna and wanted to know if it could change her taste. I let her know it was listed that the medication could cause this however it will resolve once she finishes the medication.

## 2023-06-14 ENCOUNTER — Telehealth: Payer: Self-pay | Admitting: Physician Assistant

## 2023-06-14 ENCOUNTER — Telehealth: Payer: Self-pay

## 2023-06-14 NOTE — Telephone Encounter (Signed)
The patient called in and left a voicemail requesting a change of her medication (Voquezna) 20 MG. She have no taste and she wants to speak with Mrs. Gerre Pebbles nurse. I called the patient back to let her know we got her message and I sent the message to the nurse. The patient said that she was able to get rid of her cough. She is not sure what is causing her to lose her taste.  Spoke with patient and she has scheduled appointment on 06-28-23 @ 1:30. I let her know her medications can be discussed and or changed at this time- she said she still has no taste but I let her know her taste will most likely return once the medication voquesna is stopped.

## 2023-06-14 NOTE — Telephone Encounter (Signed)
The patient called in and left a voicemail requesting a change of her medication (Voquezna) 20 MG. She have no taste and she wants to speak with Mrs. Gerre Pebbles nurse. I called the patient back to let her know we got her message and I sent the message to the nurse. The patient said that she was able to get rid of her cough. She is not sure what is causing her to lose her taste.

## 2023-06-27 NOTE — Progress Notes (Signed)
 Ellouise Console, PA-C [address]  [address]  [place], [place] [address]  Main: [phone]  Fax: [phone]   Primary Care Physician: Justus Leita DEL, MD  Primary Gastroenterologist:  Ellouise Console, PA-C / Dr. Rogelia Copping    CC:   F/U Chronic Cough  HPI: Rebecca Gutierrez is a 83 y.o. female returns for 5-week follow-up of chronic cough for 2 years.  1 month ago she was switched from pantoprazole  40 Mg twice daily to samples of Voquezna  20 Mg once daily.  Continued on Pepcid  20 Mg twice daily.  Consider referral for surgical repair of hiatal hernia if symptoms persisted.  She took Voquezna  for 1 month.  Lost her sense of taste.  She stopped taking Voquezna  3 or 4 days ago.  She is only taking Pepcid  20 Mg once daily before dinner.  She has never had any heartburn, chest pain, acid taste in her throat, or regurgitation of acid.   02/2023: Chest x-ray showed diffuse reticular densities throughout both lungs.  She had pulmonology evaluation with Dr. Malka.  His impression was that her chronic lung inflammation was secondary to ongoing acid reflux due to large hiatal hernia.  She had chest CT, labs, echocardiogram, pulmonary function tests.  Chest CT showed small to borderline enlarged mediastinal lymph nodes concerning for sarcoidosis.  Labs showed positive ANA speckled pattern and she was referred to rheumatologist for further evaluation.  Echocardiogram showed LVEF 55 to 60%.  Moderate mitral valve regurgitation.  Had evaluation by Rheumatology Dr. Tobie 05/20/23 for positive ANA.  Diagnosed with Lupus and was started on Prednisone  and CellCept.  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 PFTs with normal spirometry, normal lung volumes and moderate to severely reduced DLCO. Suggestive of PVD. PEFR was  increased consistent with possible ILD.    -12/2022: Normal laryngoscopy by ENT. -10/2021 Bravo Test: Showed many episodes of acid reflux.  -10/21/2021 EGD by Dr. Copping: showed a large 10 cm hiatal hernia, normal stomach and normal duodenum.  Esophageal biopsies were negative for Barrett's esophagus.  Mild chronic esophagitis.   Current Symptoms: Patient's cough has resolved.  She has not had any cough in the past 5 or 6 weeks.  She is feeling 100% better.  No shortness of breath.  It is uncertain which medication helped her breathing and cough, however I suspect it was the Prednisone  and Symbiocort.  She Started Voquezna  20mg  every day 12/2; Started Prednisone  50mg  and CellCept 500mg  BID 12/5; Stared Symbicort  80-4.22mcg 2 puf BID 12/5.    Current Outpatient Medications  Medication Sig Dispense Refill   aspirin EC 81 MG tablet Take 81 mg by mouth at bedtime.      budesonide -formoterol  (SYMBICORT ) 80-4.5 MCG/ACT inhaler Inhale 2 puffs into the lungs in the morning and at bedtime. 1 each 12   Cholecalciferol 50 MCG (2000 UT) TABS Take 1 capsule by mouth in the morning.     estradiol  (ESTRACE  VAGINAL) 0.1 MG/GM vaginal cream Apply 0.5mg  (pea-sized amount)  just inside the vaginal introitus with a finger-tip on Monday, Wednesday and Friday nights. 30 g 0   famotidine  (PEPCID ) 20 MG tablet TAKE 1 TABLET BY MOUTH TWICE  DAILY 180 tablet 0   hydrochlorothiazide  (HYDRODIURIL ) 25 MG tablet TAKE 1 TABLET BY MOUTH DAILY 90 tablet 3   metoprolol  tartrate (LOPRESSOR ) 25 MG  tablet TAKE 1 AND 1/2 TABLETS BY MOUTH  TWICE DAILY 270 tablet 1   nitrofurantoin  (MACRODANTIN ) 100 MG capsule Take 1 capsule (100 mg total) by mouth at bedtime. 90 capsule 3   predniSONE  (DELTASONE ) 10 MG tablet Take 10 mg by mouth daily with breakfast.     simvastatin  (ZOCOR ) 10 MG tablet Take 1 tablet (10 mg total) by mouth daily. 90 tablet 3   Vonoprazan Fumarate  (VOQUEZNA ) 20 MG TABS Take 20 mg by mouth daily.     No current  facility-administered medications for this visit.    Allergies as of 06/28/2023 - Review Complete 06/28/2023  Allergen Reaction Noted   Ciprofloxacin  Other (See Comments) 05/27/2018   Levaquin  [levofloxacin ] Other (See Comments) 05/11/2017   Potassium clavulanate [clavulanic acid] Diarrhea 10/21/2022   [name] Diarrhea 06/24/2015   [name]  Palpitations and Rash 06/02/2007    Past Medical History:  Diagnosis Date   Abnormal ECG 10/28/2017   Anxiety    Bradycardia 11/10/2017   Cancer (HCC)    skin ca   Cystocele 09/13/2013   small recurrent, less than before 09/13/13 JVF    Gilbert's syndrome    Hearing loss 1975   History of recurrent urinary tract infection 03/24/2016   Hyperlipidemia 2000   Hypertension 2000   Recurrent UTI 03/10/2015    Past Surgical History:  Procedure Laterality Date   ANTERIOR AND POSTERIOR REPAIR N/A 05/02/2013   Procedure: ANTERIOR (CYSTOCELE) AND POSTERIOR REPAIR (RECTOCELE);  Surgeon: Norleen LULLA Server, MD;  Location: [facility];  Service: Gynecology;  Laterality: N/A;   BRAVO PH STUDY N/A 10/21/2021   Procedure: BRAVO PH STUDY;  Surgeon: Jinny Carmine, MD;  Location: [facility] ENDOSCOPY;  Service: Endoscopy;  Laterality: N/A;   CATARACT EXTRACTION, BILATERAL     CHOLECYSTECTOMY  10/15/2020   COLONOSCOPY  08/28/2011   Procedure: COLONOSCOPY;  Surgeon: Claudis RAYMOND Rivet, MD;  Location: [facility] SUITE;  Service: Endoscopy;  Laterality: N/A;  930   ESOPHAGOGASTRODUODENOSCOPY N/A 10/21/2021   Procedure: ESOPHAGOGASTRODUODENOSCOPY (EGD);  Surgeon: Jinny Carmine, MD;  Location: [facility] ENDOSCOPY;  Service: Endoscopy;  Laterality: N/A;   EYE SURGERY Bilateral 02/13/2011   other 2013   MOHS SURGERY  06/2016   Removed skin cancer from Rt shin   TUBAL LIGATION     VAGINAL HYSTERECTOMY N/A 05/02/2013   Procedure: HYSTERECTOMY VAGINAL;  Surgeon: Norleen LULLA Server, MD;  Location: [facility]S;  Service: Gynecology;  Laterality: N/A;    Review of Systems:     All systems reviewed and negative except where noted in HPI.   Physical Examination:   BP (!) 168/72   Pulse 74   Temp 98.5 F (36.9 C)   Ht 5' 1 (1.549 m)   Wt 131 lb (59.4 kg)   BMI 24.75 kg/m   General: Well-nourished, well-developed in no acute distress.  Lungs: Clear to auscultation bilaterally. Non-labored. Heart: Regular rate and rhythm, no murmurs rubs or gallops.  Abdomen: Bowel sounds are normal; Abdomen is Soft; No hepatosplenomegaly, masses or hernias;  No Abdominal Tenderness; No guarding or rebound tenderness. Neuro: Alert and oriented x 3.  Grossly intact.  Psych: Alert and cooperative, normal mood and affect.   Imaging Studies: ECHOCARDIOGRAM COMPLETE Result Date: 05/31/2023    ECHOCARDIOGRAM REPORT   Patient Name:   Rebecca Gutierrez Date of Exam: 05/31/2023 Medical Rec #:  [id number]          Height:       61.0 in Accession #:    [id number]  Weight:       126.0 lb Date of Birth:  [birth year]          BSA:          1.552 m Patient Age:    82 years           BP:           136/70 mmHg Patient Gender: F                  HR:           70 bpm. Exam Location:  [facility] Procedure: 2D Echo, Cardiac Doppler and Color Doppler Indications:     Dyspnea R06.00                  Shortness of breath R06.02  History:         Patient has no prior history of Echocardiogram examinations.                  Risk Factors:Dyslipidemia. Anxiety.  Sonographer:     Christopher Furnace Referring Phys:  [id number] DARRIN BARN Diagnosing Phys: Deatrice Cage MD IMPRESSIONS  1. Left ventricular ejection fraction, by estimation, is 55 to 60%. The left ventricle has normal function. The left ventricle has no regional wall motion abnormalities. There is moderate left ventricular hypertrophy. Left ventricular diastolic parameters were normal.  2. Right ventricular systolic function is normal. The right ventricular size is normal. There is mildly elevated pulmonary artery systolic pressure. The estimated  right ventricular systolic pressure is 37.9 mmHg.  3. Left atrial size was mildly dilated.  4. The mitral valve is degenerative. Moderate mitral valve regurgitation. No evidence of mitral stenosis. There is moderate holosystolic prolapse of both leaflets of the mitral valve.  5. The aortic valve is calcified. Aortic valve regurgitation is not visualized. Aortic valve sclerosis/calcification is present, without any evidence of aortic stenosis. FINDINGS  Left Ventricle: Left ventricular ejection fraction, by estimation, is 55 to 60%. The left ventricle has normal function. The left ventricle has no regional wall motion abnormalities. The left ventricular internal cavity size was normal in size. There is  moderate left ventricular hypertrophy. Left ventricular diastolic parameters were normal. Right Ventricle: The right ventricular size is normal. No increase in right ventricular wall thickness. Right ventricular systolic function is normal. There is mildly elevated pulmonary artery systolic pressure. The tricuspid regurgitant velocity is 2.87  m/s, and with an assumed right atrial pressure of 5 mmHg, the estimated right ventricular systolic pressure is 37.9 mmHg. Left Atrium: Left atrial size was mildly dilated. Right Atrium: Right atrial size was normal in size. Pericardium: There is no evidence of pericardial effusion. Mitral Valve: The mitral valve is degenerative in appearance. There is moderate holosystolic prolapse of both leaflets of the mitral valve. There is mild thickening of the mitral valve leaflet(s). Moderate mitral valve regurgitation. No evidence of mitral valve stenosis. MV peak gradient, 5.0 mmHg. The mean mitral valve gradient is 2.0 mmHg. Tricuspid Valve: The tricuspid valve is normal in structure. Tricuspid valve regurgitation is trivial. No evidence of tricuspid stenosis. Aortic Valve: The aortic valve is calcified. Aortic valve regurgitation is not visualized. Aortic valve  sclerosis/calcification is present, without any evidence of aortic stenosis. Aortic valve mean gradient measures 4.0 mmHg. Aortic valve peak gradient measures 7.8 mmHg. Aortic valve area, by VTI measures 2.14 cm. Pulmonic Valve: The pulmonic valve was normal in structure. Pulmonic valve regurgitation is mild. No evidence of pulmonic stenosis. Aorta: The  aortic root is normal in size and structure. Venous: The inferior vena cava was not well visualized. IAS/Shunts: No atrial level shunt detected by color flow Doppler.  LEFT VENTRICLE PLAX 2D LVIDd:         3.70 cm   Diastology LVIDs:         2.70 cm   LV e' medial:    7.62 cm/s LV PW:         1.30 cm   LV E/e' medial:  11.5 LV IVS:        1.60 cm   LV e' lateral:   10.00 cm/s LVOT diam:     1.90 cm   LV E/e' lateral: 8.8 LV SV:         69 LV SV Index:   44 LVOT Area:     2.84 cm  RIGHT VENTRICLE RV Basal diam:  3.40 cm RV Mid diam:    2.70 cm RV S prime:     14.70 cm/s TAPSE (M-mode): 2.6 cm LEFT ATRIUM             Index        RIGHT ATRIUM          Index LA diam:        3.40 cm 2.19 cm/m   RA Area:     9.58 cm LA Vol (A2C):   65.7 ml 42.33 ml/m  RA Volume:   19.40 ml 12.50 ml/m LA Vol (A4C):   70.6 ml 45.49 ml/m LA Biplane Vol: 74.7 ml 48.13 ml/m  AORTIC VALVE                    PULMONIC VALVE AV Area (Vmax):    2.13 cm     PR End Diast Vel: 4.49 msec AV Area (Vmean):   1.97 cm AV Area (VTI):     2.14 cm AV Vmax:           139.50 cm/s AV Vmean:          93.800 cm/s AV VTI:            0.322 m AV Peak Grad:      7.8 mmHg AV Mean Grad:      4.0 mmHg LVOT Vmax:         105.00 cm/s LVOT Vmean:        65.300 cm/s LVOT VTI:          0.243 m LVOT/AV VTI ratio: 0.75  AORTA Ao Root diam: 2.70 cm MITRAL VALVE                  TRICUSPID VALVE MV Area (PHT): 5.38 cm       TR Peak grad:   32.9 mmHg MV Area VTI:   2.06 cm       TR Vmax:        287.00 cm/s MV Peak grad:  5.0 mmHg MV Mean grad:  2.0 mmHg       SHUNTS MV Vmax:       1.12 m/s       Systemic VTI:  0.24 m  MV Vmean:      75.2 cm/s      Systemic Diam: 1.90 cm MV Decel Time: 141 msec MR Peak grad:    95.3 mmHg MR Mean grad:    58.0 mmHg MR Vmax:         488.00 cm/s MR Vmean:        348.0 cm/s MR  PISA:         1.57 cm MR PISA Eff ROA: 12 mm MR PISA Radius:  0.50 cm MV E velocity: 87.50 cm/s MV A velocity: 84.40 cm/s MV E/A ratio:  1.04 Deatrice Cage MD Electronically signed by Deatrice Cage MD Signature Date/Time: 05/31/2023/1:28:05 PM    Final     Assessment and Plan:   Rebecca Gutierrez is a 83 y.o. y/o female returns for follow-up of chronic cough.  Most likely due to lupus causing interstitial lung disease.  Currently improved on prednisone , Symbicort , and CellCept.  I do NOT believe her cough was due to acid reflux.  She is doing well off PPI at this time.  She has never had any heartburn,  acid brash or symptoms of acid reflux other than cough.  1.  Chronic cough and dyspnea with exertion: Currently resolved.  2.  Systemic lupus erythematosus affecting lungs  Continue follow-up with rheumatologist  Continue CellCept and prednisone  as prescribed  3.  Interstitial lung disease             Chest CT concerning for possible sarcoidosis             Continue follow-up with pulmonology  Continue Symbicort  inhaler.   4.  Laryngeal pharyngeal reflux; silent reflux. Assymptomatic off PPI.  Remain off PPI.  Remain off Voquezna  which caused loss of taste.  Continue Pepcid  20mg  1 tablet once daily.  Continue Avoiding GERD trigger foods / drinks.  Followup with GI if cough returns.   Consider starting Dexilant 60mg  daily if cough returns.   5.  Large hiatal hernia -currently asymptomatic.             Surgical referral is not necessary at this time.    Ellouise Console, PA-C  Follow up as needed if recurrent cough or other GI symptoms.

## 2023-06-28 ENCOUNTER — Ambulatory Visit (INDEPENDENT_AMBULATORY_CARE_PROVIDER_SITE_OTHER): Payer: Medicare Other | Admitting: Physician Assistant

## 2023-06-28 ENCOUNTER — Encounter: Payer: Self-pay | Admitting: Physician Assistant

## 2023-06-28 VITALS — BP 168/72 | HR 74 | Temp 98.5°F | Ht 61.0 in | Wt 131.0 lb

## 2023-06-28 DIAGNOSIS — K449 Diaphragmatic hernia without obstruction or gangrene: Secondary | ICD-10-CM

## 2023-06-28 DIAGNOSIS — R053 Chronic cough: Secondary | ICD-10-CM

## 2023-06-28 DIAGNOSIS — K219 Gastro-esophageal reflux disease without esophagitis: Secondary | ICD-10-CM

## 2023-06-28 DIAGNOSIS — J849 Interstitial pulmonary disease, unspecified: Secondary | ICD-10-CM | POA: Diagnosis not present

## 2023-06-28 DIAGNOSIS — M3213 Lung involvement in systemic lupus erythematosus: Secondary | ICD-10-CM | POA: Diagnosis not present

## 2023-06-28 DIAGNOSIS — M320 Drug-induced systemic lupus erythematosus: Secondary | ICD-10-CM

## 2023-07-06 DIAGNOSIS — J849 Interstitial pulmonary disease, unspecified: Secondary | ICD-10-CM | POA: Insufficient documentation

## 2023-07-06 DIAGNOSIS — M329 Systemic lupus erythematosus, unspecified: Secondary | ICD-10-CM | POA: Insufficient documentation

## 2023-07-30 ENCOUNTER — Encounter: Payer: Self-pay | Admitting: Cardiology

## 2023-07-30 ENCOUNTER — Ambulatory Visit: Payer: Medicare Other | Attending: Cardiology | Admitting: Cardiology

## 2023-07-30 VITALS — BP 142/70 | HR 88 | Ht 61.0 in | Wt 130.8 lb

## 2023-07-30 DIAGNOSIS — I2510 Atherosclerotic heart disease of native coronary artery without angina pectoris: Secondary | ICD-10-CM

## 2023-07-30 DIAGNOSIS — I10 Essential (primary) hypertension: Secondary | ICD-10-CM | POA: Diagnosis not present

## 2023-07-30 DIAGNOSIS — E7800 Pure hypercholesterolemia, unspecified: Secondary | ICD-10-CM

## 2023-07-30 NOTE — Patient Instructions (Signed)
Medication Instructions:  Your Physician recommend you continue on your current medication as directed.    *If you need a refill on your cardiac medications before your next appointment, please call your pharmacy*   Lab Work: None ordered at this time  If you have labs (blood work) drawn today and your tests are completely normal, you will receive your results only by: MyChart Message (if you have MyChart) OR A paper copy in the mail If you have any lab test that is abnormal or we need to change your treatment, we will call you to review the results.   Follow-Up: At [facility], you and your health needs are our priority.  As part of our continuing mission to provide you with exceptional heart care, we have created designated Provider Care Teams.  These Care Teams include your primary Cardiologist (physician) and Advanced Practice Providers (APPs -  Physician Assistants and Nurse Practitioners) who all work together to provide you with the care you need, when you need it.   Your next appointment:   6 month(s)  Provider:   You may see Dr Azucena Cecil or one of the following Advanced Practice Providers on your designated Care Team:   Nicolasa Ducking, NP Eula Listen, PA-C Cadence Fransico Michael, PA-C Charlsie Quest, NP Carlos Levering, NP

## 2023-07-30 NOTE — Progress Notes (Signed)
Cardiology Office Note:    Date:  07/30/2023   ID:  Rebecca Gutierrez, DOB 08-04-1940, MRN [id number]  PCP:  Reubin Milan, MD   [facility] Providers Cardiologist:  None     Referring MD: Reubin Milan, MD   Chief Complaint  Patient presents with   New Patient (Initial Visit)    Referred for cardiac evaluation of dyspnea.  History with [facility] cardiology in  2021    History of Present Illness:    Rebecca Gutierrez is a 83 y.o. female with a hx of hyperlipidemia, hypertension, hard of hearing presenting with shortness of breath.  Endorse having shortness of breath with overexertion ongoing several months.  Follow-up with pulmonary medicine, high-resolution CT obtained 03/2023 showed coronary calcifications in LAD and RCA.  Was prescribed inhalers, symptoms of shortness of breath improved.  Denies chest pain.  States feeling fine.  Able to perform activities of daily living without any adverse effects.   Echocardiogram 05/2023 shows normal systolic function EF 55 to 60%, normal diastolic function, moderate LVH.  Past Medical History:  Diagnosis Date   Abnormal ECG 10/28/2017   Anxiety    Bradycardia 11/10/2017   Cancer (HCC)    skin ca   Cystocele 09/13/2013   small recurrent, less than before 09/13/13 JVF    Gilbert's syndrome    Hearing loss 1975   History of recurrent urinary tract infection 03/24/2016   Hyperlipidemia 2000   Hypertension 2000   Recurrent UTI 03/10/2015    Past Surgical History:  Procedure Laterality Date   ANTERIOR AND POSTERIOR REPAIR N/A 05/02/2013   Procedure: ANTERIOR (CYSTOCELE) AND POSTERIOR REPAIR (RECTOCELE);  Surgeon: [name], MD;  Location: [facility] ORS;  Service: Gynecology;  Laterality: N/A;   BRAVO PH STUDY N/A 10/21/2021   Procedure: BRAVO PH STUDY;  Surgeon: Midge Minium, MD;  Location: [facility] ENDOSCOPY;  Service: Endoscopy;  Laterality: N/A;   CATARACT EXTRACTION, BILATERAL     CHOLECYSTECTOMY  10/15/2020   COLONOSCOPY   08/28/2011   Procedure: COLONOSCOPY;  Surgeon: Malissa Hippo, MD;  Location: [facility] ENDO SUITE;  Service: Endoscopy;  Laterality: N/A;  930   ESOPHAGOGASTRODUODENOSCOPY N/A 10/21/2021   Procedure: ESOPHAGOGASTRODUODENOSCOPY (EGD);  Surgeon: Midge Minium, MD;  Location: [facility] ENDOSCOPY;  Service: Endoscopy;  Laterality: N/A;   EYE SURGERY Bilateral 02/13/2011   other 2013   MOHS SURGERY  06/2016   Removed skin cancer from Rt shin   TUBAL LIGATION     VAGINAL HYSTERECTOMY N/A 05/02/2013   Procedure: HYSTERECTOMY VAGINAL;  Surgeon: [name], MD;  Location: [facility] ORS;  Service: Gynecology;  Laterality: N/A;    Current Medications: Current Meds  Medication Sig   aspirin EC 81 MG tablet Take 81 mg by mouth at bedtime.    budesonide-formoterol (SYMBICORT) 80-4.5 MCG/ACT inhaler Inhale 2 puffs into the lungs in the morning and at bedtime.   Cholecalciferol 50 MCG (2000 UT) TABS Take 1 capsule by mouth in the morning.   estradiol (ESTRACE VAGINAL) 0.1 MG/GM vaginal cream Apply 0.5mg  (pea-sized amount)  just inside the vaginal introitus with a finger-tip on Monday, Wednesday and Friday nights.   famotidine (PEPCID) 20 MG tablet TAKE 1 TABLET BY MOUTH TWICE  DAILY   hydrochlorothiazide (HYDRODIURIL) 25 MG tablet TAKE 1 TABLET BY MOUTH DAILY   metoprolol tartrate (LOPRESSOR) 25 MG tablet TAKE 1 AND 1/2 TABLETS BY MOUTH  TWICE DAILY   mycophenolate (CELLCEPT) 500 MG tablet Take 1,000 mg by mouth 2 (two)  times daily.   nitrofurantoin (MACRODANTIN) 100 MG capsule Take 1 capsule (100 mg total) by mouth at bedtime.   predniSONE (DELTASONE) 10 MG tablet Take 10 mg by mouth daily with breakfast.   simvastatin (ZOCOR) 10 MG tablet Take 1 tablet (10 mg total) by mouth daily.     Allergies:   Ciprofloxacin, Levaquin [levofloxacin], Potassium clavulanate [clavulanic acid], [name], and [name]   Social History   Socioeconomic History   Marital status: Widowed    Spouse  name: Not on file   Number of children: 2   Years of education: 33   Highest education level: 12th grade  Occupational History   Occupation: Retired  Tobacco Use   Smoking status: Never   Smokeless tobacco: Never  Vaping Use   Vaping status: Never Used  Substance and Sexual Activity   Alcohol use: Yes    Alcohol/week: 1.0 standard drink of alcohol    Types: 1 Standard drinks or equivalent per week   Drug use: Never   Sexual activity: Never    Partners: Male    Birth control/protection: Post-menopausal, Surgical  Other Topics Concern   Not on file  Social History Narrative   Pt lives alone   Social Drivers of Health   Financial Resource Strain: Low Risk  (05/20/2023)   Received from [facility]   Overall Financial Resource Strain (CARDIA)    Difficulty of Paying Living Expenses: Not hard at all  Food Insecurity: No Food Insecurity (05/20/2023)   Received from [facility]   Hunger Vital Sign    Worried About Running Out of Food in the Last Year: Never true    Ran Out of Food in the Last Year: Never true  Transportation Needs: No Transportation Needs (05/20/2023)   Received from [facility] - Transportation    In the past 12 months, has lack of transportation kept you from medical appointments or from getting medications?: No    Lack of Transportation (Non-Medical): No  Physical Activity: Inactive (04/29/2023)   Exercise Vital Sign    Days of Exercise per Week: 1 day    Minutes of Exercise per Session: 0 min  Stress: No Stress Concern Present (04/29/2023)   [employer] of Occupational Health - Occupational Stress Questionnaire    Feeling of Stress : Not at all  Social Connections: Unknown (04/29/2023)   Social Connection and Isolation Panel [NHANES]    Frequency of Communication with Friends and Family: More than three times a week    Frequency of Social Gatherings with Friends and Family: Once a week     Attends Religious Services: Patient declined    Database administrator or Organizations: No    Attends Banker Meetings: Never    Marital Status: Widowed     Family History: The patient's family history includes Cancer in her mother; Healthy in her brother and father; Hypertension in her mother. There is no history of Colon cancer, Prostate cancer, Kidney cancer, Bladder Cancer, Breast cancer, or Heart disease.  ROS:   Please see the history of present illness.     All other systems reviewed and are negative.  EKGs/Labs/Other Studies Reviewed:    The following studies were reviewed today:  EKG Interpretation Date/Time:  Friday July 30 2023 14:13:59 EST Ventricular Rate:  88 PR Interval:  150 QRS Duration:  88 QT Interval:  342 QTC Calculation: 413 R Axis:   41  Text Interpretation: Sinus  rhythm with occasional Premature ventricular complexes Nonspecific ST and T wave abnormality Confirmed by Debbe Odea ([id number]) on 07/30/2023 2:15:27 PM    Recent Labs: 10/30/2022: ALT 32; BUN 18; Creatinine, Ser 0.86; Hemoglobin 13.7; Platelets 161; Potassium 4.2; Sodium 138; TSH 2.980  Recent Lipid Panel    Component Value Date/Time   CHOL 163 10/30/2022 1014   TRIG 94 10/30/2022 1014   HDL 57 10/30/2022 1014   CHOLHDL 2.9 10/30/2022 1014   CHOLHDL 2.1 09/25/2015 0822   VLDL 13 09/25/2015 0822   LDLCALC 89 10/30/2022 1014     Risk Assessment/Calculations:         Physical Exam:    VS:  BP (!) 142/70 (BP Location: Left Arm, Patient Position: Sitting, Cuff Size: Normal)   Pulse 88   Ht 5\' 1"  (1.549 m)   Wt 130 lb 12.8 oz (59.3 kg)   SpO2 95%   BMI 24.71 kg/m     Wt Readings from Last 3 Encounters:  07/30/23 130 lb 12.8 oz (59.3 kg)  06/28/23 131 lb (59.4 kg)  05/20/23 126 lb (57.2 kg)     GEN:  Well nourished, well developed in no acute distress HEENT: Normal NECK: No JVD; No carotid bruits CARDIAC: RRR, no murmurs, rubs, gallops RESPIRATORY:   Clear to auscultation without rales, wheezing or rhonchi  ABDOMEN: Soft, non-tender, non-distended MUSCULOSKELETAL:  No edema; No deformity  SKIN: Warm and dry NEUROLOGIC:  Alert and oriented x 3 PSYCHIATRIC:  Normal affect   ASSESSMENT:    1. Coronary artery disease involving native coronary artery of native heart, unspecified whether angina present   2. Primary hypertension   3. Pure hypercholesterolemia    PLAN:    In order of problems listed above:  CAD(LAD, RCA calcifications on chest CT).  Denies chest pain.  Shortness of breath improved with using inhalers indicating noncardiac origin.  Continue aspirin, simvastatin.  Echo showed normal EF 55 to 60%.  Consider stress testing if angina symptoms occur. Hypertension, BP elevated today, usually controlled.  Continue HCTZ 25 mg daily, Lopressor 37.5 mg twice daily. Hyperlipidemia, simvastatin 10 mg daily.  Follow-up in 6 months.     Medication Adjustments/Labs and Tests Ordered: Current medicines are reviewed at length with the patient today.  Concerns regarding medicines are outlined above.  Orders Placed This Encounter  Procedures   EKG 12-Lead   No orders of the defined types were placed in this encounter.   Patient Instructions  Medication Instructions:  Your Physician recommend you continue on your current medication as directed.    *If you need a refill on your cardiac medications before your next appointment, please call your pharmacy*   Lab Work: None ordered at this time  If you have labs (blood work) drawn today and your tests are completely normal, you will receive your results only by: MyChart Message (if you have MyChart) OR A paper copy in the mail If you have any lab test that is abnormal or we need to change your treatment, we will call you to review the results.   Follow-Up: At [facility], you and your health needs are our priority.  As part of our continuing mission to provide you with  exceptional heart care, we have created designated Provider Care Teams.  These Care Teams include your primary Cardiologist (physician) and Advanced Practice Providers (APPs -  Physician Assistants and Nurse Practitioners) who all work together to provide you with the care you need, when you need it.  Your next appointment:   6 month(s)  Provider:   You may see Dr Azucena Cecil or one of the following Advanced Practice Providers on your designated Care Team:   Nicolasa Ducking, NP Eula Listen, PA-C Cadence Fransico Michael, PA-C Charlsie Quest, NP Carlos Levering, NP    Signed, Debbe Odea, MD  07/30/2023 3:44 PM    [facility]

## 2023-08-18 ENCOUNTER — Ambulatory Visit: Payer: Medicare Other | Admitting: Pulmonary Disease

## 2023-08-19 ENCOUNTER — Ambulatory Visit: Payer: Medicare Other | Admitting: Pulmonary Disease

## 2023-09-01 ENCOUNTER — Encounter: Payer: Self-pay | Admitting: Pulmonary Disease

## 2023-09-01 ENCOUNTER — Ambulatory Visit: Admitting: Pulmonary Disease

## 2023-09-01 VITALS — BP 142/70 | HR 68 | Temp 97.6°F | Ht 61.0 in | Wt 132.0 lb

## 2023-09-01 DIAGNOSIS — J849 Interstitial pulmonary disease, unspecified: Secondary | ICD-10-CM

## 2023-09-01 DIAGNOSIS — M351 Other overlap syndromes: Secondary | ICD-10-CM

## 2023-09-01 NOTE — Progress Notes (Signed)
 Synopsis: Referred in by Rebecca Milan, MD   Subjective:   PATIENT ID: Rebecca Gutierrez GENDER: female DOB: June 04, 1941, MRN: [id number]  Chief Complaint  Patient presents with   Follow-up    No cough, shortness of breath or wheezing.     HPI Rebecca Gutierrez is an 83 year old female patient with a past medical history of GERD, hiatal hernia, hyperlipidemia and hypertension presenting to the pulmonary clinic as a referral from her primary care physician for chronic cough.  She said that back in May her cough started to worsen now it is soon as she vocalizes she starts coughing her is dry and associated with shortness of breath specifically on exertion.  She denies any heartburn denies acid taste in mouth and denies postnasal drip.  She denies any history of asthma or any trigger to the cough.  She denies any chest wheezing or chest tightness.  She saw ENT specialist underwent a laryngoscopy did not find any abnormalities.  She does follow-up with a gastroenterologist and have her on Protonix 40 mg twice daily as well as famotidine 20 mg twice daily.  PFTs with normal spirometry, normal lung volumes and moderate to severely reduced DLCO. Suggestive of PVD. PEFR was increased consistent with possible ILD.   High res CT chest 04/08/2023 - Small to moderate enlarged meds LN  with diffuse perilymphatic nodularity suggestive of sarcoidosis.   ANA 1:1280 - ScL70 48 - Anti sm 76 - Anti U1 RNP 76    She saw Dr. Allena Katz at [facility] and was started on MMF and Prednisone and have not felt any different.   She also reports no difference with the inhaler and is asking to be tapered off.   She denies any systemic symptoms including dysphagia, raynaud, joint issues, dry mouth or eyes. Denies any rashes.   Family history she denies any family history of pulmonary diseases.  Social history never smoker drinks alcohol occasionally no illicit drug use and no pets at home.  ROS All systems were reviewed and  are negative except for the above.  Objective:   Vitals:   09/01/23 1122  BP: (!) 142/70  Pulse: 68  Temp: 97.6 F (36.4 C)  TempSrc: Temporal  SpO2: 97%  Weight: 132 lb (59.9 kg)  Height: 5\' 1"  (1.549 m)   97% on RA BMI Readings from Last 3 Encounters:  09/01/23 24.94 kg/m  07/30/23 24.71 kg/m  06/28/23 24.75 kg/m   Wt Readings from Last 3 Encounters:  09/01/23 132 lb (59.9 kg)  07/30/23 130 lb 12.8 oz (59.3 kg)  06/28/23 131 lb (59.4 kg)    Physical Exam GEN: NAD, Healthy Appearing HEENT: Supple Neck, Reactive Pupils, EOMI  CVS: Normal S1, Normal S2, RRR, No murmurs or ES appreciated  Lungs: Clear bilateral air entry.  Abdomen: Soft, non tender, non distended, + BS  Extremities: Warm and well perfused, No edema  Skin: No suspicious lesions appreciated  Psych: Normal Affect  Ancillary Information   CBC    Component Value Date/Time   WBC 4.9 10/30/2022 1014   WBC 3.4 (L) 03/29/2015 0912   RBC 4.75 10/30/2022 1014   RBC 5 01/13/2018 1147   RBC 4.49 03/29/2015 0912   HGB 13.7 10/30/2022 1014   HCT 42.3 10/30/2022 1014   PLT 161 10/30/2022 1014   MCV 89 10/30/2022 1014   MCV 91 10/13/2011 1515   MCH 28.8 10/30/2022 1014   MCH 31.2 03/29/2015 0912   MCHC 32.4 10/30/2022 1014   MCHC  35.6 03/29/2015 0912   RDW 12.8 10/30/2022 1014   RDW 13.3 10/13/2011 1515   LYMPHSABS 0.9 10/30/2022 1014   MONOABS 0.3 03/29/2015 0912   EOSABS 0.2 10/30/2022 1014   BASOSABS 0.0 10/30/2022 1014       Latest Ref Rng & Units 04/13/2023   11:26 AM  PFT Results  FVC-Pre L 2.22  P  FVC-Predicted Pre % 98  P  FVC-Post L 2.20  P  FVC-Predicted Post % 97  P  Pre FEV1/FVC % % 84  P  Post FEV1/FCV % % 85  P  FEV1-Pre L 1.86  P  FEV1-Predicted Pre % 112  P  FEV1-Post L 1.87  P  DLCO uncorrected ml/min/mmHg 7.49  P  DLCO UNC% % 44  P  DLVA Predicted % 51  P  TLC L 3.83  P  TLC % Predicted % 83  P  RV % Predicted % 81  P    P Preliminary result     Assessment &  Plan:  Ms. [name] is an 83 year old female patient with a past medical history of GERD, hiatal hernia, hyperlipidemia and hypertension presenting to the pulmonary clinic as a referral from her primary care physician for chronic cough.  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 Echo with mildly increased DLCO. cardiology eval. WHO  fc II.   Symptoms include mainly cough with dyspnea on exertion.   []  Repeat HRCT and PFT end of April early may.  []  Advised her to taper off her inhaler and if symptoms recur than we should restart it.  []  Follows with Dr. Allena Katz and is currently weaned off MMF. On prednisone taper.  []  Referral to cardiology for RHC placed. []  Agree PPI and H2 blocker per GI   Return in about 3 months (around 12/02/2023).  I spent 30 minutes caring for this patient today, including preparing to see the patient, obtaining a medical history , reviewing a separately obtained history, performing a medically appropriate examination and/or evaluation, ordering medications, tests, or procedures, referring and communicating with other health care professionals (not separately reported), documenting clinical information in the electronic health record, and independently interpreting results (not separately reported/billed) and communicating results to the patient/family/caregiver  Janann Colonel, MD [facility] 09/01/2023 2:07 PM

## 2023-09-10 ENCOUNTER — Other Ambulatory Visit: Payer: Self-pay | Admitting: Internal Medicine

## 2023-09-10 DIAGNOSIS — I10 Essential (primary) hypertension: Secondary | ICD-10-CM

## 2023-09-13 NOTE — Telephone Encounter (Signed)
 Patient must keep upcoming office visit for additional refills.Courtesy refill. Requested Prescriptions  Pending Prescriptions Disp Refills   metoprolol tartrate (LOPRESSOR) 25 MG tablet [Pharmacy Med Name: Metoprolol Tartrate 25 MG Oral Tablet] 90 tablet 0    Sig: TAKE 1 AND 1/2 TABLETS BY MOUTH  TWICE DAILY     Cardiovascular:  Beta Blockers Failed - 09/13/2023  4:09 PM      Failed - Last BP in normal range    BP Readings from Last 1 Encounters:  09/01/23 (!) 142/70         Failed - Valid encounter within last 6 months    Recent Outpatient Visits   None     Future Appointments             In 1 month Judithann Graves, Nyoka Cowden, MD [facility] at [facility]   In 7 months Alfredo Martinez, MD [facility] [place]            Passed - Last Heart Rate in normal range    Pulse Readings from Last 1 Encounters:  09/01/23 68

## 2023-09-29 ENCOUNTER — Encounter: Payer: Self-pay | Admitting: Internal Medicine

## 2023-09-29 ENCOUNTER — Ambulatory Visit (INDEPENDENT_AMBULATORY_CARE_PROVIDER_SITE_OTHER): Admitting: Internal Medicine

## 2023-09-29 VITALS — BP 136/78 | HR 78 | Ht 61.0 in | Wt 135.4 lb

## 2023-09-29 DIAGNOSIS — L539 Erythematous condition, unspecified: Secondary | ICD-10-CM

## 2023-09-29 DIAGNOSIS — I10 Essential (primary) hypertension: Secondary | ICD-10-CM | POA: Diagnosis not present

## 2023-09-29 DIAGNOSIS — R220 Localized swelling, mass and lump, head: Secondary | ICD-10-CM | POA: Diagnosis not present

## 2023-09-29 NOTE — Progress Notes (Signed)
 Date:  09/29/2023   Name:  Rebecca Gutierrez   DOB:  December 23, 1940   MRN:  [id number]   Chief Complaint: Facial Swelling (Patient said her left side of the face has been swollen, redness)  HPI She has recently been diagnosed with SLE and ILD.  She received a long taper of prednisone and took Cellcept for a while.  However, she stopped it for unclear reasons.  During this time she lost her sense of taste but her breathing did improve. She also has GERD and was on Voquenza for some time.  This was recently changed to bid Pepcid.  Her concern is a left sided facial redness that comes and goes.  It feels warm but not painful with minimal swelling.  She has some left jaw discomfort.  She is adentulous with dentures.  No ear pain, no sinus pressure or tenderness. Today there is no redness or swelling.  Review of Systems  Constitutional:  Negative for chills, fatigue and fever.  Respiratory:  Negative for chest tightness and shortness of breath.   Cardiovascular:  Negative for chest pain.  Skin:  Positive for color change.  Neurological:  Negative for dizziness and headaches.  Psychiatric/Behavioral:  Negative for dysphoric mood and sleep disturbance. The patient is not nervous/anxious.      Lab Results  Component Value Date   NA 138 10/30/2022   K 4.2 10/30/2022   CO2 24 10/30/2022   GLUCOSE 109 (H) 10/30/2022   BUN 18 10/30/2022   CREATININE 0.86 10/30/2022   CALCIUM 9.6 10/30/2022   EGFR 68 10/30/2022   GFRNONAA 78 07/22/2020   Lab Results  Component Value Date   CHOL 163 10/30/2022   HDL 57 10/30/2022   LDLCALC 89 10/30/2022   TRIG 94 10/30/2022   CHOLHDL 2.9 10/30/2022   Lab Results  Component Value Date   TSH 2.980 10/30/2022   Lab Results  Component Value Date   HGBA1C 5.8 (H) 10/30/2022   Lab Results  Component Value Date   WBC 4.9 10/30/2022   HGB 13.7 10/30/2022   HCT 42.3 10/30/2022   MCV 89 10/30/2022   PLT 161 10/30/2022   Lab Results  Component Value  Date   ALT 32 10/30/2022   AST 35 10/30/2022   ALKPHOS 73 10/30/2022   BILITOT 1.4 (H) 10/30/2022   Lab Results  Component Value Date   VD25OH 32.2 04/01/2016     Patient Active Problem List   Diagnosis Date Noted   Systemic lupus erythematosus (SLE) in adult (HCC) 07/06/2023   ILD (interstitial lung disease) (HCC) 07/06/2023   Chronic cough 01/27/2023   Sebaceous cyst 01/27/2023   Hepatic steatosis 12/07/2021   Gastroesophageal reflux disease    Aortic atherosclerosis (HCC) 02/02/2020   Non-melanoma skin cancer 10/25/2019   Cystocele, midline 04/11/2018   Moderate mitral insufficiency 11/10/2017   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   Disorder of bone and cartilage 06/28/2009   Hyperlipidemia LDL goal <100 06/02/2007   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   Hearing loss 06/02/2007   Essential hypertension, benign 06/02/2007    Allergies  Allergen Reactions   Ciprofloxacin Other (See Comments)    Tendinitis    Levaquin [Levofloxacin] Other (See Comments)    insomnia   Potassium Clavulanate [Clavulanic Acid] Diarrhea   [name] Diarrhea   [name] Palpitations and Rash    Last reaction was in 1969    Past Surgical History:  Procedure Laterality Date   ANTERIOR AND POSTERIOR REPAIR N/A 05/02/2013  Procedure: ANTERIOR (CYSTOCELE) AND POSTERIOR REPAIR (RECTOCELE);  Surgeon: Albino Hum, MD;  Location: [facility];  Service: Gynecology;  Laterality: N/A;   BRAVO PH STUDY N/A 10/21/2021   Procedure: BRAVO PH STUDY;  Surgeon: Marnee Sink, MD;  Location: [facility] ENDOSCOPY;  Service: Endoscopy;  Laterality: N/A;   CATARACT EXTRACTION, BILATERAL     CHOLECYSTECTOMY  10/15/2020   COLONOSCOPY  08/28/2011   Procedure: COLONOSCOPY;  Surgeon: Ruby Corporal, MD;  Location: [facility] END[facility] SUITE;  Service: Endoscopy;  Laterality: N/A;  930   ESOPHAGOGASTRODUODENOSCOPY N/A 10/21/2021   Procedure: ESOPHAGOGASTRODUODENOSCOPY (EGD);  Surgeon: Marnee Sink, MD;   Location: [facility] ENDOSCOPY;  Service: Endoscopy;  Laterality: N/A;   EYE SURGERY Bilateral 02/13/2011   other 2013   MOHS SURGERY  06/2016   Removed skin cancer from Rt shin   TUBAL LIGATION     VAGINAL HYSTERECTOMY N/A 05/02/2013   Procedure: HYSTERECTOMY VAGINAL;  Surgeon: Albino Hum, MD;  Location: [facility];  Service: Gynecology;  Laterality: N/A;    Social History   Tobacco Use   Smoking status: Never   Smokeless tobacco: Never  Vaping Use   Vaping status: Never Used  Substance Use Topics   Alcohol use: Yes    Alcohol/week: 1.0 standard drink of alcohol    Types: 1 Standard drinks or equivalent per week   Drug use: Never     Medication list has been reviewed and updated.  Current Meds  Medication Sig   aspirin EC 81 MG tablet Take 81 mg by mouth at bedtime.    Cholecalciferol 50 MCG (2000 UT) TABS Take 1 capsule by mouth in the morning.   estradiol (ESTRACE VAGINAL) 0.1 MG/GM vaginal cream Apply 0.5mg  (pea-sized amount)  just inside the vaginal introitus with a finger-tip on Monday, Wednesday and Friday nights.   famotidine (PEPCID) 20 MG tablet TAKE 1 TABLET BY MOUTH TWICE  DAILY   hydrochlorothiazide (HYDRODIURIL) 25 MG tablet TAKE 1 TABLET BY MOUTH DAILY   metoprolol tartrate (LOPRESSOR) 25 MG tablet TAKE 1 AND 1/2 TABLETS BY MOUTH  TWICE DAILY   nitrofurantoin (MACRODANTIN) 100 MG capsule Take 1 capsule (100 mg total) by mouth at bedtime.   simvastatin (ZOCOR) 10 MG tablet Take 1 tablet (10 mg total) by mouth daily.       05/03/2023    9:59 AM 03/02/2023   10:16 AM 12/31/2022   11:18 AM 10/30/2022    9:32 AM  GAD 7 : Generalized Anxiety Score  Nervous, Anxious, on Edge 0 0 0 0  Control/stop worrying 0 0 0 0  Worry too much - different things 0 0 0 0  Trouble relaxing 0 0 0 0  Restless 0 0 0 0  Easily annoyed or irritable 0 0 0 0  Afraid - awful might happen 0 0 0 0  Total GAD 7 Score 0 0 0 0  Anxiety Difficulty Not difficult at all Not difficult at all Not  difficult at all Not difficult at all       05/03/2023    9:59 AM 03/02/2023   10:16 AM 12/31/2022   11:18 AM  Depression screen PHQ 2/9  Decreased Interest 0 0 0  Down, Depressed, Hopeless 0 0 0  PHQ - 2 Score 0 0 0  Altered sleeping 0 0 1  Tired, decreased energy 0 0 0  Change in appetite 0 0 0  Feeling bad or failure about yourself  0 0 0  Trouble concentrating 0 0 0  Moving slowly or fidgety/restless 0 0 0  Suicidal thoughts 0 0 0  PHQ-9 Score 0 0 1  Difficult doing work/chores Not difficult at all Not difficult at all Not difficult at all    BP Readings from Last 3 Encounters:  09/29/23 136/78  09/01/23 (!) 142/70  07/30/23 (!) 142/70    Physical Exam Constitutional:      Appearance: Normal appearance.  HENT:     Head: Normocephalic.     Jaw: There is normal jaw occlusion. No tenderness (left jaw with excursion).     Salivary Glands: Right salivary gland is not diffusely enlarged or tender. Left salivary gland is not diffusely enlarged or tender.     Right Ear: Decreased hearing noted.     Left Ear: Decreased hearing noted.     Nose:     Right Sinus: No maxillary sinus tenderness or frontal sinus tenderness.     Left Sinus: No maxillary sinus tenderness or frontal sinus tenderness.     Mouth/Throat:     Lips: No lesions.     Mouth: Mucous membranes are moist. No oral lesions.     Dentition: Has dentures.  Cardiovascular:     Rate and Rhythm: Normal rate and regular rhythm.  Pulmonary:     Breath sounds: No wheezing or rhonchi.  Musculoskeletal:     Cervical back: Normal range of motion.  Lymphadenopathy:     Cervical: No cervical adenopathy.  Skin:    General: Skin is warm and dry.  Neurological:     Mental Status: She is alert.     Wt Readings from Last 3 Encounters:  09/29/23 135 lb 6 oz (61.4 kg)  09/01/23 132 lb (59.9 kg)  07/30/23 130 lb 12.8 oz (59.3 kg)    BP 136/78   Pulse 78   Ht 5\' 1"  (1.549 m)   Wt 135 lb 6 oz (61.4 kg)   SpO2 97%    BMI 25.58 kg/m   Assessment and Plan:  Problem List Items Addressed This Visit       Unprioritized   Essential hypertension, benign (Chronic)   Blood pressure is well controlled.  Current medications hydrochlorothiazide and metoprolol. Will continue same regimen along with efforts to limit dietary sodium.       Other Visit Diagnoses       Left facial swelling    -  Primary   not apparent at this time patient will take a picture with next occurance  I suspect this is a manifestation of SLE     Redness of skin       none seen today patient to take a picture next time to show to Rheumatology       No follow-ups on file.    Sheron Dixons, MD [facility] [place]

## 2023-09-29 NOTE — Assessment & Plan Note (Signed)
 Blood pressure is well controlled.  Current medications hydrochlorothiazide and metoprolol. Will continue same regimen along with efforts to limit dietary sodium.

## 2023-10-13 NOTE — Progress Notes (Signed)
 Rheumatology Follow Up Note  Chief Complaint  Patient presents with  . Lupus      Subjective:HPI   Rebecca Gutierrez is a 83 y.o. female is here today for follow up of lupus. The patient's allergies, current medications, past family history, past medical history, past social history, past surgical history and problem list were reviewed and updated as appropriate.   She off the prednisone  and CellCept. She has noticed some stiffness of the ankles. She denies any pains. She has noticed mild swelling. She has noticed left TMJ discomfort. She has no trouble breathing. She has noticed some cough recently. She is able to do her activities without getting short of breath.   Patient denies fever, night sweats, lymphadenopathy, alopecia, dry eyes/mouth, iritis/uveitis/scleritis, parotid swelling, malar rash/photosensitive rash/other skin rash, nasal/oral ulcers, hearing loss, hemoptysis/epistaxis/recurrent sinus infection, difficulty swallowing, pleurisy, serositis, hematochezia/hematuria, seizures/stroke/DVT/PE/Raynaud's/Paresthesia.   Review of Systems:   Review of Systems  Constitutional:  Negative for fatigue.  HENT:  Positive for hearing loss. Negative for mouth sores and trouble swallowing.        Neg: Dry Mouth  Eyes:  Negative for redness.       Neg: Dry Eyes  Respiratory:  Negative for cough and shortness of breath.   Cardiovascular:  Negative for chest pain and leg swelling.  Gastrointestinal:  Negative for constipation, diarrhea and nausea.  Endocrine: Negative for cold intolerance and heat intolerance.  Genitourinary:  Negative for hematuria.  Musculoskeletal:        Per HPI  Skin:  Negative for color change and rash.  Neurological:  Negative for dizziness, weakness, numbness and headaches.  Hematological:  Does not bruise/bleed easily.  Psychiatric/Behavioral:  Negative for dysphoric mood and sleep disturbance. The patient is not nervous/anxious.   All other systems reviewed and  are negative.  Objective:  Vitals:   10/13/23 1102  BP: 132/78  Temp: 36.6 C (97.9 F)  TempSrc: Temporal  Weight: 59.4 kg (131 lb)  Height: 154.9 cm (5' 1)  PainSc: 0-No pain     Length of Stiffness: None   GEN - Pleasant, No Apparent Distress  HEENT - normocephalic and atraumatic. Conjunctiva Clear.  No Nasal/Oral Ulcer. Adequate Saliva Neck - supple with no adenopathy or thyromegaly.   C spine with full range of motion. Heart - regular rate and rhythm, No murmurs/gallops/rub, Nml S1S2 Lungs - clear to auscultation in all fields. Mild decrease air entry at base  Extremities - there is no cyanosis or edema. Neurological - alert and oriented.  Spine - no paraspinal tenderness; no lumbar spine tenderness  Skin - no rashes observed; lower extremity skin thickening  MSK - The following joints were examined bilaterally: Hands, Wrists, Elbows, Shoulders, Metatarsals, Ankels, Knees and Hips; they were normal apart from what is noted.    100% Fist Formation Mild DIP Enlargement  No Synovitis or Dactylitis Strength 5/5 Proximal and Distal, Upper and Lower Extremity No Tender Point ______________________________________________________________________ Labs/Imaging Reviewed in EMR Cr 0.80, Ast 17, ALT 20 CBC Hgb 14.3, Hct 41.2, Plt 161 HgA1C 5.8 TSH 2.98 UA: No Protein or RBC ESR 7 Normal C3 and C4 Pos: ANA (>1:1280), Anti-Sm (76), Anti-U1 RNP (76), Anti-Scl-70 (48) Neg: Anti-Centromere, And-dsDNA, Anti SSA, AntiSSB, Anti-Cardiolipin (IgG, IgA, IgM), Anti-Chromatin, Anti-CCP, RF, Anti-TPO, Hypersensitivity Pneumonitis Profile   PFTs with normal spirometry, normal lung volumes and moderate to severely reduced DLCO. Suggestive of PVD. PEFR was increased consistent with possible ILD.    CT Chest HR (03/2023):  1. Small to  borderline enlarged mediastinal lymph nodes with diffuse perilymphatic nodularity in the lungs, findings indicative of sarcoid.  2. Tiny bilateral renal  stones.  3. Aortic atherosclerosis (ICD10-I70.0). Coronary artery calcification.    Assessment and Plan     Lupus/MCTD -- Positive ANA, Smith, RNP, Scl-70, Lung disease with cough and dyspnea, lymph node enlargement, lower extremity skin thickening   -- Pulmonologist: Dr.Assaker  -- Off the Prednisone   -- Continue Pepcid  -- Was on CellCept 1000 mg twice daily (Could be affecting her taste) -- She is starting to noticed dry cough again. Will start CellCept 500 mg daily -- Reviewed Labs   2. Long term use of immunosuppressive medication -- Patient has been educated on the potential adverse events associated with prednisone  including but not limited to elevated blood sugars, decrease bone density as well as glaucoma and mood affects. The patient is to keep regular follow up appointments so that patient can be monitored for the above adverse events.  -- CellCept is an immunosuppressive medication that require monitoring, side effects reviewed with the patient -- Reviewed Labs  Diagnoses and all orders for this visit:  Systemic lupus erythematosus (SLE) in adult (CMS/HHS-HCC) -     mycophenolate (CELLCEPT) 500 mg tablet; Take 1 tablet (500 mg total) by mouth once daily With food  ILD (interstitial lung disease) (CMS/HHS-HCC) -     mycophenolate (CELLCEPT) 500 mg tablet; Take 1 tablet (500 mg total) by mouth once daily With food  Long-term use of immunosuppressant medication      Return in about 2 months (around 12/13/2023) for Routine Follow Up.   All new prescription medications, changes in current prescription dosages, and sample medications were discussed with the patient, including patient education, medication name, use, dosage, potential side effects, drug interactions, consequences of not using/taking, and special instructions.  Patient expressed understanding.  No barriers to adherence.   I appreciate the opportunity to participate in the care of [employer]. Please do  not hesitate to contact me with any questions or concerns that may arise in regards to the patient's rheumatologic disease.    Attestation Statement:   I personally performed the service. (TP)  MAYUR LOREE BLANCH, MD

## 2023-10-18 ENCOUNTER — Ambulatory Visit
Admission: RE | Admit: 2023-10-18 | Discharge: 2023-10-18 | Disposition: A | Discharge status: Home / Self Care | Source: Ambulatory Visit | Attending: Pulmonary Disease | Admitting: Pulmonary Disease

## 2023-10-18 DIAGNOSIS — I700 Atherosclerosis of aorta: Secondary | ICD-10-CM | POA: Diagnosis not present

## 2023-10-18 DIAGNOSIS — R590 Localized enlarged lymph nodes: Secondary | ICD-10-CM | POA: Insufficient documentation

## 2023-10-18 DIAGNOSIS — K449 Diaphragmatic hernia without obstruction or gangrene: Secondary | ICD-10-CM | POA: Diagnosis not present

## 2023-10-18 DIAGNOSIS — M351 Other overlap syndromes: Secondary | ICD-10-CM | POA: Diagnosis present

## 2023-10-18 DIAGNOSIS — I2510 Atherosclerotic heart disease of native coronary artery without angina pectoris: Secondary | ICD-10-CM | POA: Insufficient documentation

## 2023-10-26 ENCOUNTER — Other Ambulatory Visit: Payer: Self-pay | Admitting: Internal Medicine

## 2023-10-26 DIAGNOSIS — I10 Essential (primary) hypertension: Secondary | ICD-10-CM

## 2023-10-28 ENCOUNTER — Ambulatory Visit: Payer: Self-pay | Admitting: Pulmonary Disease

## 2023-10-28 ENCOUNTER — Telehealth: Payer: Self-pay | Admitting: Pulmonary Disease

## 2023-10-28 DIAGNOSIS — R0602 Shortness of breath: Secondary | ICD-10-CM

## 2023-10-28 MED ORDER — BUDESONIDE-FORMOTEROL FUMARATE 160-4.5 MCG/ACT IN AERO: 2.0000 | INHALATION_SPRAY | Freq: Two times a day (BID) | RESPIRATORY_TRACT | 12 refills | Status: DC

## 2023-10-28 NOTE — Telephone Encounter (Signed)
 Copied from CRM [id number]. Topic: Clinical - Red Word Triage >> Oct 28, 2023 11:26 AM Rebecca Gutierrez wrote: Red Word that prompted transfer to Nurse Triage: Pt stated she stopped using the inhaler back in March per provider's direction, however her cough and shortness of breath has came back. Pt stated she's had the cough and SOB for about two weeks now. Pt has been using the left over inhaler she had, but she stated it doesn't seem like it is helping her improve and the shortness of breath is excalated from previously having it.  TRIAGE SUMMARY NOTE: Pt reporting that she has had intermittent SOB more than usual for the past 2 weeks, as well as productive cough to white and sometimes green sputum. Pt confirms that she was taken off of meds in the past but had a leftover budesonide  inhaler that she restarted 2 weeks ago, doing 2 puffs in morning and 2 at night, inhaler had been at 60 puffs left, now more like 20, reporting budesonide  is not really helping, and pt confirms she does not have a rescue inhaler. Pt reporting that the SOB is really only in the morning or if cough a lot, but confirms no SOB at rest or with walking. Advised pt be examined in next 4 hours, no pulm availability, advised PCP or UC in meantime, offered to find PCP appt today, pt declines. Advised that sending HP message to pulm office for call back to pt with further recommendations, advised call back for any worsening or new symptoms. Pt verbalized understanding. Please advise.  E2C2 Pulmonary Triage - Initial Assessment Questions "Chief Complaint (e.g., cough, sob, wheezing, fever, chills, sweat or additional symptoms) *Go to specific symptom protocol after initial questions. Rheum had her on prednisone  and such, then took all away a while back Cough is coming back again but more SOB this time 2 weeks ago cough and SOB Cough up mucus sometimes really green sometimes whitish like, not real real green lately Inhaler budesonide  formoterol  2  puffs in morning and 2 at night, not really helping Call disconnected SOB not all the time, SOB if cough a lot, huffing and puffing a little bit, just walking around normal I'm not SOB No wheezing, chest pain, dizziness, fever, runny nose, weakness Speaking in full sentences  "How long have symptoms been present?" 2 weeks  "Have you used your inhalers/maintenance medication?" Yes If yes, "What medications?" Budesonide  inhaler 2x/day not really helping, no rescue inhaler  OXYGEN: "Do you wear supplemental oxygen?" No  "Do you monitor your oxygen levels?" No  Reason for Disposition  [1] MILD difficulty breathing (e.g., minimal/no SOB at rest, SOB with walking, pulse <100) AND [2] NEW-onset or WORSE than normal  Answer Assessment - Initial Assessment Questions 6. CARDIAC HISTORY: "Do you have any history of heart disease?" (e.g., heart attack, angina, bypass surgery, angioplasty)      significant 7. LUNG HISTORY: "Do you have any history of lung disease?"  (e.g., pulmonary embolus, asthma, emphysema)     significant  Protocols used: Breathing Difficulty-A-AH

## 2023-10-28 NOTE — Telephone Encounter (Signed)
 Requested Prescriptions  Pending Prescriptions Disp Refills   metoprolol  tartrate (LOPRESSOR ) 25 MG tablet [Pharmacy Med Name: Metoprolol  Tartrate 25 MG Oral Tablet] 90 tablet 1    Sig: TAKE 1 AND 1/2 TABLETS BY MOUTH  TWICE DAILY     Cardiovascular:  Beta Blockers Failed - 10/28/2023 12:50 PM      Failed - Valid encounter within last 6 months    Recent Outpatient Visits           4 weeks ago Left facial swelling   [facility], Chales [place], MD       Future Appointments             In 4 days Sheron Dixons, MD [facility] at [facility], [facility]   In 6 months Erman Hayward, MD [facility] [place]            Passed - Last BP in normal range    BP Readings from Last 1 Encounters:  09/29/23 136/78         Passed - Last Heart Rate in normal range    Pulse Readings from Last 1 Encounters:  09/29/23 78

## 2023-10-28 NOTE — Telephone Encounter (Signed)
 Discussed with patient

## 2023-10-28 NOTE — Telephone Encounter (Signed)
 Noted. Nothing further needed.

## 2023-10-28 NOTE — Telephone Encounter (Signed)
 Patient with recurrent cough after 3 weeks of stopping prednisone  and cellcept. Will prescribe Symbicort  160-4.5 2 puffs bid.   Annitta Kindler, MD [facility] 10/28/2023 4:45 PM

## 2023-11-01 ENCOUNTER — Encounter: Payer: Self-pay | Admitting: Internal Medicine

## 2023-11-01 ENCOUNTER — Ambulatory Visit (INDEPENDENT_AMBULATORY_CARE_PROVIDER_SITE_OTHER): Payer: Self-pay | Admitting: Internal Medicine

## 2023-11-01 VITALS — BP 138/82 | HR 82 | Ht 61.0 in | Wt 131.1 lb

## 2023-11-01 DIAGNOSIS — Z0000 Encounter for general adult medical examination without abnormal findings: Secondary | ICD-10-CM

## 2023-11-01 DIAGNOSIS — I10 Essential (primary) hypertension: Secondary | ICD-10-CM | POA: Diagnosis not present

## 2023-11-01 DIAGNOSIS — J849 Interstitial pulmonary disease, unspecified: Secondary | ICD-10-CM | POA: Diagnosis not present

## 2023-11-01 DIAGNOSIS — Z1231 Encounter for screening mammogram for malignant neoplasm of breast: Secondary | ICD-10-CM

## 2023-11-01 DIAGNOSIS — K219 Gastro-esophageal reflux disease without esophagitis: Secondary | ICD-10-CM

## 2023-11-01 DIAGNOSIS — I700 Atherosclerosis of aorta: Secondary | ICD-10-CM

## 2023-11-01 DIAGNOSIS — E785 Hyperlipidemia, unspecified: Secondary | ICD-10-CM | POA: Diagnosis not present

## 2023-11-01 DIAGNOSIS — R81 Glycosuria: Secondary | ICD-10-CM

## 2023-11-01 DIAGNOSIS — R7309 Other abnormal glucose: Secondary | ICD-10-CM

## 2023-11-01 MED ORDER — SIMVASTATIN 10 MG PO TABS: 10.0000 mg | ORAL_TABLET | Freq: Every day | ORAL | 3 refills | Status: AC

## 2023-11-01 NOTE — Assessment & Plan Note (Addendum)
 Reflux symptoms are controlled on pepcid  once a day. Patient denies red flag symptoms - no melena, weight loss, dysphagia.

## 2023-11-01 NOTE — Assessment & Plan Note (Addendum)
 Blood pressure is fairly well controlled.  Current medications are metoprolol  and hydrochlorothiazide . Will continue same regimen along with efforts to limit dietary sodium.

## 2023-11-01 NOTE — Patient Instructions (Signed)
 Call [facility] to schedule your mammogram at [phone]. This is due in July.

## 2023-11-01 NOTE — Assessment & Plan Note (Signed)
 On statin therapy

## 2023-11-01 NOTE — Assessment & Plan Note (Signed)
 LDL is  Lab Results  Component Value Date   LDLCALC 89 10/30/2022   Current regimen is simvastatin .  No medication side effects noted. Goal LDL is <100.

## 2023-11-01 NOTE — Assessment & Plan Note (Addendum)
 Cough has been increasing recently. She continues on Mycophenolate once a day. Pulmonary just increased her Symbicort  to 160 mg.

## 2023-11-01 NOTE — Progress Notes (Signed)
 Date:  11/01/2023   Name:  Rebecca Gutierrez   DOB:  11-10-1940   MRN:  [id number]   Chief Complaint: Annual Exam Rebecca Gutierrez is a 83 y.o. female who presents today for her Complete Annual Exam. She feels well. She reports exercising none. She reports she is sleeping well. Breast complaints none.  Health Maintenance  Topic Date Due   COVID-19 Vaccine (6 - Pfizer Gutierrez 2024-25 season) 10/20/2023   Medicare Annual Wellness Visit  11/04/2023   Mammogram  12/28/2023   Flu Shot  01/14/2024   DTaP/Tdap/Td vaccine (5 - Tdap) 03/14/2024   Pneumonia Vaccine  Completed   DEXA scan (bone density measurement)  Completed   Zoster (Shingles) Vaccine  Completed   HPV Vaccine  Aged Out   Meningitis B Vaccine  Aged Out   Hepatitis C Screening  Discontinued    Hypertension This is a chronic problem. The problem is controlled. Pertinent negatives include no chest pain, headaches, palpitations or shortness of breath. Past treatments include beta blockers and diuretics. The current treatment provides significant improvement. There is no history of kidney disease, CAD/MI or CVA.  Hyperlipidemia This is a chronic problem. The problem is controlled. Pertinent negatives include no chest pain, myalgias or shortness of breath. Current antihyperlipidemic treatment includes statins.  Cough This is a recurrent problem. The current episode started 1 to 4 weeks ago. The problem has been unchanged. The problem occurs every few minutes. The cough is Non-productive. Associated symptoms include heartburn. Pertinent negatives include no chest pain, headaches, myalgias, shortness of breath or wheezing. She has tried a beta-agonist inhaler and steroid inhaler for the symptoms. The treatment provided mild relief. There is no history of environmental allergies.  Gastroesophageal Reflux She complains of coughing and heartburn. She reports no abdominal pain, no chest pain or no wheezing. This is a recurrent problem.  The problem occurs rarely. Pertinent negatives include no fatigue. She has tried a histamine-2 antagonist (no longer needs Pantoprazole ) for the symptoms.    Review of Systems  Constitutional:  Negative for fatigue and unexpected weight change.  HENT:  Positive for hearing loss. Negative for trouble swallowing.   Eyes:  Negative for visual disturbance.  Respiratory:  Positive for cough. Negative for chest tightness, shortness of breath and wheezing.   Cardiovascular:  Negative for chest pain, palpitations and leg swelling.  Gastrointestinal:  Positive for heartburn. Negative for abdominal pain, constipation and diarrhea.  Genitourinary:  Negative for dysuria and pelvic pain.  Musculoskeletal:  Positive for arthralgias (both ankles). Negative for myalgias.  Allergic/Immunologic: Negative for environmental allergies.  Neurological:  Negative for dizziness, weakness, light-headedness and headaches.  Psychiatric/Behavioral:  Negative for dysphoric mood and sleep disturbance. The patient is not nervous/anxious.      Lab Results  Component Value Date   NA 138 10/30/2022   K 4.2 10/30/2022   CO2 24 10/30/2022   GLUCOSE 109 (H) 10/30/2022   BUN 18 10/30/2022   CREATININE 0.86 10/30/2022   CALCIUM 9.6 10/30/2022   EGFR 68 10/30/2022   GFRNONAA 78 07/22/2020   Lab Results  Component Value Date   CHOL 163 10/30/2022   HDL 57 10/30/2022   LDLCALC 89 10/30/2022   TRIG 94 10/30/2022   CHOLHDL 2.9 10/30/2022   Lab Results  Component Value Date   TSH 2.980 10/30/2022   Lab Results  Component Value Date   HGBA1C 5.8 (H) 10/30/2022   Lab Results  Component Value Date   WBC 4.9 10/30/2022  HGB 13.7 10/30/2022   HCT 42.3 10/30/2022   MCV 89 10/30/2022   PLT 161 10/30/2022   Lab Results  Component Value Date   ALT 32 10/30/2022   AST 35 10/30/2022   ALKPHOS 73 10/30/2022   BILITOT 1.4 (H) 10/30/2022   Lab Results  Component Value Date   VD25OH 32.2 04/01/2016     Patient  Active Problem List   Diagnosis Date Noted   Systemic lupus erythematosus (SLE) in adult [facility]) 07/06/2023   ILD (interstitial lung disease) (HCC) 07/06/2023   Chronic cough 01/27/2023   Sebaceous cyst 01/27/2023   Hepatic steatosis 12/07/2021   Gastroesophageal reflux disease    Aortic atherosclerosis (HCC) 02/02/2020   Non-melanoma skin cancer 10/25/2019   Cystocele, midline 04/11/2018   Moderate mitral insufficiency 11/10/2017   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   Disorder of bone and cartilage 06/28/2009   Hyperlipidemia LDL goal <100 06/02/2007   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   Hearing loss 06/02/2007   Essential hypertension, benign 06/02/2007    Allergies  Allergen Reactions   Ciprofloxacin  Other (See Comments)    Tendinitis    Levaquin  [Levofloxacin ] Other (See Comments)    insomnia   Potassium Clavulanate [Clavulanic Acid] Diarrhea   [name] Diarrhea   [name]  Palpitations and Rash    Last reaction was in 1969    Past Surgical History:  Procedure Laterality Date   ANTERIOR AND POSTERIOR REPAIR N/A 05/02/2013   Procedure: ANTERIOR (CYSTOCELE) AND POSTERIOR REPAIR (RECTOCELE);  Surgeon: Albino Hum, MD;  Location: [facility] ORS;  Service: Gynecology;  Laterality: N/A;   BRAVO PH STUDY N/A 10/21/2021   Procedure: BRAVO PH STUDY;  Surgeon: Marnee Sink, MD;  Location: [facility] ENDOSCOPY;  Service: Endoscopy;  Laterality: N/A;   CATARACT EXTRACTION, BILATERAL     CHOLECYSTECTOMY  10/15/2020   COLONOSCOPY  08/28/2011   Procedure: COLONOSCOPY;  Surgeon: Ruby Corporal, MD;  Location: [facility] ENDO SUITE;  Service: Endoscopy;  Laterality: N/A;  930   ESOPHAGOGASTRODUODENOSCOPY N/A 10/21/2021   Procedure: ESOPHAGOGASTRODUODENOSCOPY (EGD);  Surgeon: Marnee Sink, MD;  Location: [facility] ENDOSCOPY;  Service: Endoscopy;  Laterality: N/A;   EYE SURGERY Bilateral 02/13/2011   other 2013   MOHS SURGERY  06/2016   Removed skin cancer from Rt shin   TUBAL  LIGATION     VAGINAL HYSTERECTOMY N/A 05/02/2013   Procedure: HYSTERECTOMY VAGINAL;  Surgeon: Albino Hum, MD;  Location: [facility] ORS;  Service: Gynecology;  Laterality: N/A;    Social History   Tobacco Use   Smoking status: Never   Smokeless tobacco: Never  Vaping Use   Vaping status: Never Used  Substance Use Topics   Alcohol use: Yes    Alcohol/week: 1.0 standard drink of alcohol    Types: 1 Standard drinks or equivalent per week   Drug use: Never     Medication list has been reviewed and updated.  Current Meds  Medication Sig   aspirin EC 81 MG tablet Take 81 mg by mouth at bedtime.    budesonide -formoterol  (SYMBICORT ) 160-4.5 MCG/ACT inhaler Inhale 2 puffs into the lungs in the morning and at bedtime.   Cholecalciferol 50 MCG (2000 UT) TABS Take 1 capsule by mouth in the morning.   estradiol  (ESTRACE  VAGINAL) 0.1 MG/GM vaginal cream Apply 0.5mg  (pea-sized amount)  just inside the vaginal introitus with a finger-tip on Monday, Wednesday and Friday nights.   famotidine  (PEPCID ) 20 MG tablet TAKE 1 TABLET BY MOUTH TWICE  DAILY (Patient taking differently: Take 20  mg by mouth daily.)   hydrochlorothiazide  (HYDRODIURIL ) 25 MG tablet TAKE 1 TABLET BY MOUTH DAILY   metoprolol  tartrate (LOPRESSOR ) 25 MG tablet TAKE 1 AND 1/2 TABLETS BY MOUTH  TWICE DAILY   mycophenolate (CELLCEPT) 500 MG tablet Take 500 mg by mouth daily.   nitrofurantoin  (MACRODANTIN ) 100 MG capsule Take 1 capsule (100 mg total) by mouth at bedtime.   [DISCONTINUED] pantoprazole  (PROTONIX ) 40 MG tablet Take 40 mg by mouth 2 (two) times daily.   [DISCONTINUED] simvastatin  (ZOCOR ) 10 MG tablet Take 1 tablet (10 mg total) by mouth daily.       11/01/2023   10:11 AM 05/03/2023    9:59 AM 03/02/2023   10:16 AM 12/31/2022   11:18 AM  GAD 7 : Generalized Anxiety Score  Nervous, Anxious, on Edge 0 0 0 0  Control/stop worrying 0 0 0 0  Worry too much - different things 0 0 0 0  Trouble relaxing 0 0 0 0  Restless 0 0  0 0  Easily annoyed or irritable 0 0 0 0  Afraid - awful might happen 0 0 0 0  Total GAD 7 Score 0 0 0 0  Anxiety Difficulty Not difficult at all Not difficult at all Not difficult at all Not difficult at all       11/01/2023   10:10 AM 05/03/2023    9:59 AM 03/02/2023   10:16 AM  Depression screen PHQ 2/9  Decreased Interest 0 0 0  Down, Depressed, Hopeless 0 0 0  PHQ - 2 Score 0 0 0  Altered sleeping 0 0 0  Tired, decreased energy 0 0 0  Change in appetite 0 0 0  Feeling bad or failure about yourself  0 0 0  Trouble concentrating 0 0 0  Moving slowly or fidgety/restless 0 0 0  Suicidal thoughts 0 0 0  PHQ-9 Score 0 0 0  Difficult doing work/chores Not difficult at all Not difficult at all Not difficult at all    BP Readings from Last 3 Encounters:  11/01/23 138/82  09/29/23 136/78  09/01/23 (!) 142/70    Physical Exam Vitals and nursing note reviewed.  Constitutional:      General: She is not in acute distress.    Appearance: She is well-developed.  HENT:     Head: Normocephalic and atraumatic.     Right Ear: Tympanic membrane and ear canal normal. Decreased hearing noted.     Left Ear: Tympanic membrane and ear canal normal. Decreased hearing noted.     Nose:     Right Sinus: No maxillary sinus tenderness.     Left Sinus: No maxillary sinus tenderness.     Mouth/Throat:     Pharynx: Oropharynx is clear.  Eyes:     General: No scleral icterus.       Right eye: No discharge.        Left eye: No discharge.     Conjunctiva/sclera: Conjunctivae normal.  Neck:     Thyroid : No thyromegaly.     Vascular: No carotid bruit.  Cardiovascular:     Rate and Rhythm: Normal rate and regular rhythm.     Pulses: Normal pulses.     Heart sounds: Normal heart sounds.  Pulmonary:     Effort: Pulmonary effort is normal. No respiratory distress.     Breath sounds: Normal breath sounds. No decreased breath sounds, wheezing or rhonchi.  Abdominal:     General: Bowel sounds are  normal.  Palpations: Abdomen is soft.     Tenderness: There is no abdominal tenderness. There is no guarding or rebound.  Musculoskeletal:     Cervical back: Normal range of motion. No erythema.     Right lower leg: No edema.     Left lower leg: No edema.  Lymphadenopathy:     Cervical: No cervical adenopathy.  Skin:    General: Skin is warm and dry.     Findings: No rash.  Neurological:     Mental Status: She is alert and oriented to person, place, and time.     Cranial Nerves: No cranial nerve deficit.     Sensory: No sensory deficit.     Deep Tendon Reflexes: Reflexes are normal and symmetric.  Psychiatric:        Attention and Perception: Attention normal.        Mood and Affect: Mood normal.     Wt Readings from Last 3 Encounters:  11/01/23 131 lb 2 oz (59.5 kg)  09/29/23 135 lb 6 oz (61.4 kg)  09/01/23 132 lb (59.9 kg)    BP 138/82   Pulse 82   Ht 5\' 1"  (1.549 m)   Wt 131 lb 2 oz (59.5 kg)   SpO2 96%   BMI 24.78 kg/m   Assessment and Plan:  Problem List Items Addressed This Visit       Unprioritized   Hyperlipidemia LDL goal <100 (Chronic)   LDL is  Lab Results  Component Value Date   LDLCALC 89 10/30/2022   Current regimen is simvastatin .  No medication side effects noted. Goal LDL is <100.       Relevant Medications   simvastatin  (ZOCOR ) 10 MG tablet   Other Relevant Orders   Lipid panel   Essential hypertension, benign (Chronic)   Blood pressure is fairly well controlled.  Current medications are metoprolol  and hydrochlorothiazide . Will continue same regimen along with efforts to limit dietary sodium.       Relevant Medications   simvastatin  (ZOCOR ) 10 MG tablet   Other Relevant Orders   CBC with Differential/Platelet   TSH   Basic metabolic panel with GFR   Aortic atherosclerosis (HCC) (Chronic)   On statin therapy.      Relevant Medications   simvastatin  (ZOCOR ) 10 MG tablet   Gastroesophageal reflux disease (Chronic)   Reflux  symptoms are controlled on pepcid  once a day. Patient denies red flag symptoms - no melena, weight loss, dysphagia.       Relevant Orders   CBC with Differential/Platelet   ILD (interstitial lung disease) (HCC)   Cough has been increasing recently. She continues on Mycophenolate once a day. Pulmonary just increased her Symbicort  to 160 mg.      Other Visit Diagnoses       Annual physical exam    -  Primary   up to date on screenings and immunizations     Encounter for screening mammogram for breast cancer       Relevant Orders   MM 3D SCREENING MAMMOGRAM BILATERAL BREAST     Elevated serum glucose with glucosuria       Relevant Orders   Hemoglobin A1c       Return in about 6 months (around 05/03/2024) for HTN.    Sheron Dixons, MD [facility] [place]

## 2023-11-02 ENCOUNTER — Ambulatory Visit: Payer: Self-pay | Admitting: Internal Medicine

## 2023-11-02 LAB — CBC WITH DIFFERENTIAL/PLATELET
Basophils Absolute: 0.1 10*3/uL (ref 0.0–0.2)
Basos: 1 %
EOS (ABSOLUTE): 0.2 10*3/uL (ref 0.0–0.4)
Eos: 2 %
Hematocrit: 41.8 % (ref 34.0–46.6)
Hemoglobin: 13.7 g/dL (ref 11.1–15.9)
Immature Grans (Abs): 0 10*3/uL (ref 0.0–0.1)
Immature Granulocytes: 0 %
Lymphocytes Absolute: 1 10*3/uL (ref 0.7–3.1)
Lymphs: 12 %
MCH: 30.2 pg (ref 26.6–33.0)
MCHC: 32.8 g/dL (ref 31.5–35.7)
MCV: 92 fL (ref 79–97)
Monocytes Absolute: 0.5 10*3/uL (ref 0.1–0.9)
Monocytes: 6 %
Neutrophils Absolute: 6.5 10*3/uL (ref 1.4–7.0)
Neutrophils: 79 %
Platelets: 246 10*3/uL (ref 150–450)
RBC: 4.54 x10E6/uL (ref 3.77–5.28)
RDW: 12.4 % (ref 11.7–15.4)
WBC: 8.3 10*3/uL (ref 3.4–10.8)

## 2023-11-02 LAB — BASIC METABOLIC PANEL WITH GFR
BUN/Creatinine Ratio: 20 (ref 12–28)
BUN: 17 mg/dL (ref 8–27)
CO2: 21 mmol/L (ref 20–29)
Calcium: 10 mg/dL (ref 8.7–10.3)
Chloride: 100 mmol/L (ref 96–106)
Creatinine, Ser: 0.85 mg/dL (ref 0.57–1.00)
Glucose: 98 mg/dL (ref 70–99)
Potassium: 4.2 mmol/L (ref 3.5–5.2)
Sodium: 138 mmol/L (ref 134–144)
eGFR: 69 mL/min/{1.73_m2} (ref >59)

## 2023-11-02 LAB — HEMOGLOBIN A1C
Est. average glucose Bld gHb Est-mCnc: 111 mg/dL
Hgb A1c MFr Bld: 5.5 % (ref 4.8–5.6)

## 2023-11-02 LAB — LIPID PANEL
Chol/HDL Ratio: 2.6 ratio (ref 0.0–4.4)
Cholesterol, Total: 159 mg/dL (ref 100–199)
HDL: 62 mg/dL (ref >39)
LDL Chol Calc (NIH): 82 mg/dL (ref 0–99)
Triglycerides: 81 mg/dL (ref 0–149)
VLDL Cholesterol Cal: 15 mg/dL (ref 5–40)

## 2023-11-02 LAB — TSH: TSH: 2.74 u[IU]/mL (ref 0.450–4.500)

## 2023-11-10 ENCOUNTER — Ambulatory Visit: Payer: Medicare Other

## 2023-11-11 ENCOUNTER — Telehealth: Payer: Self-pay

## 2023-11-11 NOTE — Telephone Encounter (Signed)
 Copied from CRM [id number]. Topic: General - Other >> Nov 11, 2023  1:10 PM Tianna S wrote: Reason for CRM: patient would like to know if dr assaker can prescribe her with something for her mucus and cough she is experiencing, she has acid reflux

## 2023-11-16 NOTE — Telephone Encounter (Signed)
Noted, nothing further needed.  

## 2023-11-17 ENCOUNTER — Encounter: Payer: Self-pay | Admitting: Internal Medicine

## 2023-11-17 ENCOUNTER — Other Ambulatory Visit: Payer: Self-pay | Admitting: Internal Medicine

## 2023-11-17 ENCOUNTER — Ambulatory Visit (INDEPENDENT_AMBULATORY_CARE_PROVIDER_SITE_OTHER): Admitting: Internal Medicine

## 2023-11-17 VITALS — BP 138/78 | HR 75 | Ht 61.0 in | Wt 132.0 lb

## 2023-11-17 DIAGNOSIS — N390 Urinary tract infection, site not specified: Secondary | ICD-10-CM

## 2023-11-17 DIAGNOSIS — I10 Essential (primary) hypertension: Secondary | ICD-10-CM

## 2023-11-17 LAB — POCT URINALYSIS DIPSTICK
Bilirubin, UA: NEGATIVE
Blood, UA: NEGATIVE
Glucose, UA: NEGATIVE
Ketones, UA: NEGATIVE
Nitrite, UA: NEGATIVE
Protein, UA: NEGATIVE
Spec Grav, UA: 1.01 (ref 1.010–1.025)
Urobilinogen, UA: 0.2 U/dL
pH, UA: 6.5 (ref 5.0–8.0)

## 2023-11-17 MED ORDER — CEPHALEXIN 500 MG PO CAPS: 500.0000 mg | ORAL_CAPSULE | Freq: Two times a day (BID) | ORAL | 0 refills | Status: AC

## 2023-11-17 NOTE — Assessment & Plan Note (Signed)
 On macrobid  daily for prevention. Now with symptoms of active infection and leukocytes on UA Will treat with keflex  x 7days

## 2023-11-17 NOTE — Progress Notes (Signed)
 Date:  11/17/2023   Name:  Rebecca Gutierrez   DOB:  [birth year]   MRN:  [id number]   Chief Complaint: Urinary Tract Infection (Patient said her urine is cloudy, x 1 week ago)  Urinary Tract Infection  This is a new problem. The current episode started in the past 7 days. The problem occurs every urination. The patient is experiencing no pain. There has been no fever. Pertinent negatives include no chills, frequency, hematuria or urgency. Associated symptoms comments: Cloudy urine.    Review of Systems  Constitutional:  Negative for chills, fatigue and fever.  HENT:  Positive for voice change.   Respiratory:  Positive for cough and chest tightness. Negative for wheezing.   Cardiovascular:  Negative for chest pain and palpitations.  Genitourinary:  Negative for frequency, hematuria and urgency.       Urine cloudy  Neurological:  Negative for dizziness and headaches.     Lab Results  Component Value Date   NA 138 11/01/2023   K 4.2 11/01/2023   CO2 21 11/01/2023   GLUCOSE 98 11/01/2023   BUN 17 11/01/2023   CREATININE 0.85 11/01/2023   CALCIUM 10.0 11/01/2023   EGFR 69 11/01/2023   GFRNONAA 78 07/22/2020   Lab Results  Component Value Date   CHOL 159 11/01/2023   HDL 62 11/01/2023   LDLCALC 82 11/01/2023   TRIG 81 11/01/2023   CHOLHDL 2.6 11/01/2023   Lab Results  Component Value Date   TSH 2.740 11/01/2023   Lab Results  Component Value Date   HGBA1C 5.5 11/01/2023   Lab Results  Component Value Date   WBC 8.3 11/01/2023   HGB 13.7 11/01/2023   HCT 41.8 11/01/2023   MCV 92 11/01/2023   PLT 246 11/01/2023   Lab Results  Component Value Date   ALT 32 10/30/2022   AST 35 10/30/2022   ALKPHOS 73 10/30/2022   BILITOT 1.4 (H) 10/30/2022   Lab Results  Component Value Date   VD25OH 32.2 04/01/2016     Patient Active Problem List   Diagnosis Date Noted   Systemic lupus erythematosus (SLE) in adult (HCC) 07/06/2023   ILD (interstitial lung disease)  (HCC) 07/06/2023   Chronic cough 01/27/2023   Sebaceous cyst 01/27/2023   Hepatic steatosis 12/07/2021   Gastroesophageal reflux disease    Aortic atherosclerosis (HCC) 02/02/2020   Non-melanoma skin cancer 10/25/2019   Cystocele, midline 04/11/2018   Moderate mitral insufficiency 11/10/2017   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   Recurrent UTI 03/10/2015   Disorder of bone and cartilage 06/28/2009   Hyperlipidemia LDL goal <100 06/02/2007   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   Hearing loss 06/02/2007   Essential hypertension, benign 06/02/2007    Allergies  Allergen Reactions   Ciprofloxacin  Other (See Comments)    Tendinitis    Levaquin  [Levofloxacin ] Other (See Comments)    insomnia   Potassium Clavulanate [Clavulanic Acid] Diarrhea   [name] Diarrhea   [name]  Palpitations and Rash    Last reaction was in 1969    Past Surgical History:  Procedure Laterality Date   ANTERIOR AND POSTERIOR REPAIR N/A 05/02/2013   Procedure: ANTERIOR (CYSTOCELE) AND POSTERIOR REPAIR (RECTOCELE);  Surgeon: Albino Hum, MD;  Location: [facility];  Service: Gynecology;  Laterality: N/A;   BRAVO PH STUDY N/A 10/21/2021   Procedure: BRAVO PH STUDY;  Surgeon: Marnee Sink, MD;  Location: [facility] ENDOSCOPY;  Service: Endoscopy;  Laterality: N/A;   CATARACT EXTRACTION, BILATERAL  CHOLECYSTECTOMY  10/15/2020   COLONOSCOPY  08/28/2011   Procedure: COLONOSCOPY;  Surgeon: Ruby Corporal, MD;  Location: [facility] END[facility] SU[facility];  Service: Endoscopy;  Laterality: N/A;  930   ESOPHAGOGASTRODUODENOSCOPY N/A 10/21/2021   Procedure: ESOPHAGOGASTRODUODENOSCOPY (EGD);  Surgeon: Marnee Sink, MD;  Location: [facility] ENDOSCOPY;  Service: Endoscopy;  Laterality: N/A;   EYE SURGERY Bilateral 02/13/2011   other 2013   MOHS SURGERY  06/2016   Removed skin cancer from Rt shin   TUBAL LIGATION     VAGINAL HYSTERECTOMY N/A 05/02/2013   Procedure: HYSTERECTOMY VAGINAL;  Surgeon: Albino Hum, MD;   Location: [facility];  Service: Gynecology;  Laterality: N/A;    Social History   Tobacco Use   Smoking status: Never   Smokeless tobacco: Never  Vaping Use   Vaping status: Never Used  Substance Use Topics   Alcohol use: Yes    Alcohol/week: 1.0 standard drink of alcohol    Types: 1 Standard drinks or equivalent per week   Drug use: Never     Medication list has been reviewed and updated.  Current Meds  Medication Sig   aspirin EC 81 MG tablet Take 81 mg by mouth at bedtime.    budesonide -formoterol  (SYMBICORT ) 160-4.5 MCG/ACT inhaler Inhale 2 puffs into the lungs in the morning and at bedtime.   Cholecalciferol 50 MCG (2000 UT) TABS Take 1 capsule by mouth in the morning.   estradiol  (ESTRACE  VAGINAL) 0.1 MG/GM vaginal cream Apply 0.5mg  (pea-sized amount)  just inside the vaginal introitus with a finger-tip on Monday, Wednesday and Friday nights.   famotidine  (PEPCID ) 20 MG tablet TAKE 1 TABLET BY MOUTH TWICE  DAILY (Patient taking differently: Take 20 mg by mouth daily.)   hydrochlorothiazide  (HYDRODIURIL ) 25 MG tablet TAKE 1 TABLET BY MOUTH DAILY   metoprolol  tartrate (LOPRESSOR ) 25 MG tablet TAKE 1 AND 1/2 TABLETS BY MOUTH  TWICE DAILY   mycophenolate (CELLCEPT) 500 MG tablet Take 500 mg by mouth daily.   nitrofurantoin  (MACRODANTIN ) 100 MG capsule Take 1 capsule (100 mg total) by mouth at bedtime.   simvastatin  (ZOCOR ) 10 MG tablet Take 1 tablet (10 mg total) by mouth daily.       11/01/2023   10:11 AM 05/03/2023    9:59 AM 03/02/2023   10:16 AM 12/31/2022   11:18 AM  GAD 7 : Generalized Anxiety Score  Nervous, Anxious, on Edge 0 0 0 0  Control/stop worrying 0 0 0 0  Worry too much - different things 0 0 0 0  Trouble relaxing 0 0 0 0  Restless 0 0 0 0  Easily annoyed or irritable 0 0 0 0  Afraid - awful might happen 0 0 0 0  Total GAD 7 Score 0 0 0 0  Anxiety Difficulty Not difficult at all Not difficult at all Not difficult at all Not difficult at all        11/01/2023   10:10 AM 05/03/2023    9:59 AM 03/02/2023   10:16 AM  Depression screen PHQ 2/9  Decreased Interest 0 0 0  Down, Depressed, Hopeless 0 0 0  PHQ - 2 Score 0 0 0  Altered sleeping 0 0 0  Tired, decreased energy 0 0 0  Change in appetite 0 0 0  Feeling bad or failure about yourself  0 0 0  Trouble concentrating 0 0 0  Moving slowly or fidgety/restless 0 0 0  Suicidal thoughts 0 0 0  PHQ-9 Score 0 0 0  Difficult doing work/chores Not difficult at all Not difficult at all Not difficult at all    BP Readings from Last 3 Encounters:  11/17/23 138/78  11/01/23 138/82  09/29/23 136/78   Lab Results  Component Value Date   COLORU yellow 11/17/2023   CLARITYU cloudy 11/17/2023   GLUCOSEUR Negative 11/17/2023   BILIRUBINUR negative 11/17/2023   KETONESU negative 11/17/2023   SPECGRAV 1.010 11/17/2023   RBCUR negative 11/17/2023   PHUR 6.5 11/17/2023   PROTEINUR Negative 11/17/2023   UROBILINOGEN 0.2 11/17/2023   LEUKOCYTESUR Large (3+) (A) 11/17/2023    Physical Exam Vitals and nursing note reviewed.  Constitutional:      Appearance: She is well-developed.  Cardiovascular:     Rate and Rhythm: Normal rate and regular rhythm.     Heart sounds: Normal heart sounds.  Pulmonary:     Effort: Pulmonary effort is normal. No respiratory distress.     Breath sounds: Normal breath sounds.  Abdominal:     General: Bowel sounds are normal.     Palpations: Abdomen is soft.     Tenderness: There is abdominal tenderness in the suprapubic area. There is no right CVA tenderness, left CVA tenderness, guarding or rebound.     Wt Readings from Last 3 Encounters:  11/17/23 132 lb (59.9 kg)  11/01/23 131 lb 2 oz (59.5 kg)  09/29/23 135 lb 6 oz (61.4 kg)    BP 138/78   Pulse 75   Ht 5\' 1"  (1.549 m)   Wt 132 lb (59.9 kg)   PF 97 L/min   BMI 24.94 kg/m   Assessment and Plan:  Problem List Items Addressed This Visit       Unprioritized   Recurrent UTI - Primary   On  macrobid  daily for prevention. Now with symptoms of active infection and leukocytes on UA Will treat with keflex  x 7days      Relevant Medications   cephALEXin  (KEFLEX ) 500 MG capsule   Other Relevant Orders   POCT urinalysis dipstick (Completed)    No follow-ups on file.    Sheron Dixons, MD [facility] [place]

## 2023-11-19 NOTE — Progress Notes (Signed)
 Interstitial Lung Disease Multidisciplinary Conference   KHAYLEE MCEVOY    MRN [id number]    DOB 03-02-1941  Primary Care Physician:Berglund, Chales [place], MD  Referring Physician: Dr. Lucina Sabal  Time of Conference: 7.30am- 8.30am Date of conference: 11/17/2023 Location of Conference: -  Virtual  Participating Pulmonary: Dr. Maire Scot, MD  Pathology: - Radiology: Dr Karlyn Overman MD Others: n/a  Brief History: 83 years old female presenting with chronic dry cough.  PFTs with isolated reduction in DLCO. CT chest w/ enlarged mediastinal LN and perilymphatic noduilarity. 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    PFT    Latest Ref Rng & Units 04/13/2023   11:26 AM  PFT Results  FVC-Pre L 2.22  P  FVC-Predicted Pre % 98  P  FVC-Post L 2.20  P  FVC-Predicted Post % 97  P  Pre FEV1/FVC % % 84  P  Post FEV1/FCV % % 85  P  FEV1-Pre L 1.86  P  FEV1-Predicted Pre % 112  P  FEV1-Post L 1.87  P  DLCO uncorrected ml/min/mmHg 7.49  P  DLCO UNC% % 44  P  DLVA Predicted % 51  P  TLC L 3.83  P  TLC % Predicted % 83  P  RV % Predicted % 81  P    P Preliminary result      MDD discussion of CT scan    - Date or time period of scan:  HRCT: 10/18/2023 HRCT: 04/08/2023   - Discussion synopsis:  slightly U L dominanet proess. There is nodularity. Stable Oct 2024 -? May 2025. No progerssion. More c/w SARCOID. ALTERNATIVE PATERN  - What is the final conclusion per 2018 ATS/Fleischner Criteria - More c/w SARCOID. ALTERNATIVE PATERN  - Concordance with official report: concordant  Pathology discussion of biopsy: n/a    MDD Impression/Recs: 1) CT cw/ Sarcoid but Autoimmune panel and ILD are not in syncrhony 2) get rheum opinion ; 3) start with getting ACE level, Quant Gold, Bronc with BAL and TBBx and 4)  have ILD doc in [place] see patient  after PFT in next few months   Time Spent in preparation and discussion:   > 30 min    SIGNATURE   Dr. Maire Scot, M.D., F.C.C.P,  Pulmonary and Critical Care Medicine Staff Physician, [facility] Director - Interstitial Lung Disease  Program  Pulmonary Fibrosis [facility] at [facility] [place], [place], [address]  Pager: [phone], If no answer or between  15:00h - 7:00h: call [phone] Telephone: [phone]  9:42 AM 11/19/2023 ...................................................................................................................Aaron Aas References: Diagnosis of Hypersensitivity Pneumonitis in Adults. An Official ATS/JRS/ALAT Clinical Practice Guideline. Ragu G et al, Am J Respir Crit Care Med. 2020 Aug 1;202(3):e36-e69.       Diagnosis of Idiopathic Pulmonary Fibrosis. An Official ATS/ERS/JRS/ALAT Clinical Practice Guideline. Raghu G et al, Am J Respir Crit Care Med. 2018 Sep 1;198(5):e44-e68.   IPF Suspected   Histopath ology Pattern      UIP  Probable UIP  Indeterminate for  UIP  Alternative  diagnosis    UIP  IPF  IPF  IPF  Non-IPF dx   HRCT   Probabe UIP  IPF  IPF  IPF (Likely)**  Non-IPF dx  Pattern  Indeterminate for UIP  IPF  IPF (Likely)**  Indeterminate  for IPF**  Non-IPF dx    Alternative  diagnosis  IPF (Likely)**/ non-IPF dx  Non-IPF dx  Non-IPF dx  Non-IPF dx     Idiopathic pulmonary fibrosis diagnosis based upon HRCT and Biopsy paterns.  ** IPF is the likely diagnosis when any of following features are present:  Moderate-to-severe traction bronchiectasis/bronchiolectasis (defined as mild traction bronchiectasis/bronchiolectasis in four or more lobes including the lingual as a lobe, or moderate to severe traction bronchiectasis in two or more lobes) in a man over age 55 years or in a woman over age 24 years Extensive (>30%) reticulation on HRCT and an age >70 years  Increased neutrophils and/or absence of lymphocytosis in BAL fluid   Multidisciplinary discussion reaches a confident diagnosis of IPF.   **Indeterminate for IPF  Without an adequate biopsy is unlikely to be IPF  With an adequate biopsy may be reclassified to a more specific diagnosis after multidisciplinary discussion and/or additional consultation.   dx = diagnosis; HRCT = high-resolution computed tomography; IPF = idiopathic pulmonary fibrosis; UIP = usual interstitial pneumonia.

## 2023-11-19 NOTE — Telephone Encounter (Signed)
 Reordered 10/28/23 #90 1 RF   Requested Prescriptions  Refused Prescriptions Disp Refills   metoprolol  tartrate (LOPRESSOR ) 25 MG tablet [Pharmacy Med Name: Metoprolol  Tartrate 25 MG Oral Tablet] 90 tablet 11    Sig: TAKE 1 AND 1/2 TABLETS BY MOUTH  TWICE DAILY     Cardiovascular:  Beta Blockers Passed - 11/19/2023  8:21 AM      Passed - Last BP in normal range    BP Readings from Last 1 Encounters:  11/17/23 138/78         Passed - Last Heart Rate in normal range    Pulse Readings from Last 1 Encounters:  11/17/23 75         Passed - Valid encounter within last 6 months    Recent Outpatient Visits           2 days ago Recurrent UTI   [facility] at [facility], Chales [place], MD   2 weeks ago Annual physical exam   [facility] at [facility], Chales [place], MD   1 month ago Left facial swelling   [facility] at [facility], Chales [place], MD       Future Appointments             In 5 months MacDiarmid, Geralyn Knee, MD [facility]

## 2023-11-23 NOTE — Telephone Encounter (Signed)
 Patient advised. NFN.

## 2023-11-23 NOTE — Telephone Encounter (Signed)
 Copied from CRM [id number]. Topic: Clinical - Medication Question >> Nov 23, 2023  1:46 PM Hilton Lucky wrote: Reason for CRM: Patient is wanting to know if she needs to continue taking prednisone  until her follow-up appointment or if she needs to cease taking it. Patient states she has completed five days and has five days left, please advise patient of if she should continue taking medication until next appointment.  States if needing a refill, it goes to [facility] in [place].

## 2023-12-01 ENCOUNTER — Other Ambulatory Visit: Payer: Self-pay

## 2023-12-01 DIAGNOSIS — R0602 Shortness of breath: Secondary | ICD-10-CM

## 2023-12-02 ENCOUNTER — Ambulatory Visit: Admitting: Pulmonary Disease

## 2023-12-02 DIAGNOSIS — R0602 Shortness of breath: Secondary | ICD-10-CM

## 2023-12-02 DIAGNOSIS — M351 Other overlap syndromes: Secondary | ICD-10-CM

## 2023-12-02 LAB — PULMONARY FUNCTION TEST
DL/VA % pred: 61 %
DL/VA: 2.61 ml/min/mmHg/L
DLCO unc % pred: 60 %
DLCO unc: 10.1 ml/min/mmHg
FEF 25-75 Pre: 1.19 L/s
FEF2575-%Pred-Pre: 100 %
FEV1-%Pred-Pre: 109 %
FEV1-Pre: 1.78 L
FEV1FVC-%Pred-Pre: 96 %
FEV6-%Pred-Pre: 120 %
FEV6-Pre: 2.5 L
FEV6FVC-%Pred-Pre: 105 %
FVC-%Pred-Pre: 113 %
FVC-Pre: 2.52 L
Pre FEV1/FVC ratio: 71 %
Pre FEV6/FVC Ratio: 99 %
RV % pred: 72 %
RV: 1.64 L
TLC % pred: 93 %
TLC: 4.29 L

## 2023-12-02 NOTE — Patient Instructions (Signed)
 Full PFT completed today without post.

## 2023-12-02 NOTE — Progress Notes (Signed)
 Full PFT completed today without post. Pt couldn't get ATS on FVC due to not being able to blow out for long period of time.

## 2023-12-06 ENCOUNTER — Encounter: Payer: Self-pay | Admitting: Pulmonary Disease

## 2023-12-06 ENCOUNTER — Ambulatory Visit (INDEPENDENT_AMBULATORY_CARE_PROVIDER_SITE_OTHER): Admitting: Pulmonary Disease

## 2023-12-06 VITALS — BP 140/70 | HR 81 | Temp 98.5°F | Ht 61.0 in | Wt 130.4 lb

## 2023-12-06 DIAGNOSIS — R053 Chronic cough: Secondary | ICD-10-CM | POA: Diagnosis not present

## 2023-12-06 MED ORDER — GABAPENTIN 100 MG PO CAPS: 100.0000 mg | ORAL_CAPSULE | Freq: Two times a day (BID) | ORAL | 3 refills | Status: DC

## 2023-12-06 NOTE — H&P (View-Only) (Signed)
 Synopsis: Referred in by Justus Leita DEL, MD   Subjective:   PATIENT ID: Rebecca Gutierrez GENDER: female DOB: [birth year], MRN: [id number]  Chief Complaint  Patient presents with   Follow-up    3 month follow up, mixed connective tissue disease, SOB, CT: 10/18/23, PFT: 12/02/23  Breathing has been ok, but still has a persistent wet cough and causes her to be a bit hoarse. Otherwise no complaints. Still using her Symbicort  as prescribed -- previously didn't seem to be helping but in the past couple days it seems to be more effective.    HPI Rebecca Gutierrez is an 83 year old female patient with a past medical history of GERD, hiatal hernia, hyperlipidemia and hypertension presenting to the pulmonary clinic as a referral from her primary care physician for chronic cough.  She said that back in May her cough started to worsen now it is soon as she vocalizes she starts coughing her is dry and associated with shortness of breath specifically on exertion.  She denies any heartburn denies acid taste in mouth and denies postnasal drip.  She denies any history of asthma or any trigger to the cough.  She denies any chest wheezing or chest tightness.  She saw ENT specialist underwent a laryngoscopy did not find any abnormalities.  She does follow-up with a gastroenterologist and have her on Protonix  40 mg twice daily as well as famotidine  20 mg twice daily.  PFTs with normal spirometry, normal lung volumes and moderate to severely reduced DLCO. Suggestive of PVD. PEFR was increased consistent with possible ILD.   High res CT chest 04/08/2023 - Small to moderate enlarged meds LN  with diffuse perilymphatic nodularity suggestive of sarcoidosis.   ANA 1:1280 - ScL70 48 - Anti sm 76 - Anti U1 RNP 76    She saw Dr. Tobie at [facility] and was started on MMF and Prednisone  and have not felt any different.   She also reports no difference with the inhaler and is asking to be tapered off.   She denies any systemic  symptoms including dysphagia, raynaud, joint issues, dry mouth or eyes. Denies any rashes.   Family history she denies any family history of pulmonary diseases.  Social history never smoker drinks alcohol occasionally no illicit drug use and no pets at home.  ROS All systems were reviewed and are negative except for the above.  Objective:   Vitals:   12/06/23 1125  BP: (!) 140/70  Pulse: 81  Temp: 98.5 F (36.9 C)  TempSrc: Oral  SpO2: 96%  Weight: 130 lb 6.4 oz (59.1 kg)  Height: 5' 1 (1.549 m)   96% on RA BMI Readings from Last 3 Encounters:  12/06/23 24.64 kg/m  12/02/23 24.56 kg/m  11/17/23 24.94 kg/m   Wt Readings from Last 3 Encounters:  12/06/23 130 lb 6.4 oz (59.1 kg)  12/02/23 130 lb (59 kg)  11/17/23 132 lb (59.9 kg)    Physical Exam GEN: NAD, Healthy Appearing HEENT: Supple Neck, Reactive Pupils, EOMI  CVS: Normal S1, Normal S2, RRR, No murmurs or ES appreciated  Lungs: Clear bilateral air entry.  Abdomen: Soft, non tender, non distended, + BS  Extremities: Warm and well perfused, No edema  Skin: No suspicious lesions appreciated  Psych: Normal Affect  Ancillary Information   CBC    Component Value Date/Time   WBC 8.3 11/01/2023 1101   WBC 3.4 (L) 03/29/2015 0912   RBC 4.54 11/01/2023 1101   RBC 5 01/13/2018 1147  RBC 4.49 03/29/2015 0912   HGB 13.7 11/01/2023 1101   HCT 41.8 11/01/2023 1101   PLT 246 11/01/2023 1101   MCV 92 11/01/2023 1101   MCV 91 10/13/2011 1515   MCH 30.2 11/01/2023 1101   MCH 31.2 03/29/2015 0912   MCHC 32.8 11/01/2023 1101   MCHC 35.6 03/29/2015 0912   RDW 12.4 11/01/2023 1101   RDW 13.3 10/13/2011 1515   LYMPHSABS 1.0 11/01/2023 1101   MONOABS 0.3 03/29/2015 0912   EOSABS 0.2 11/01/2023 1101   BASOSABS 0.1 11/01/2023 1101       Latest Ref Rng & Units 12/02/2023   10:51 AM 04/13/2023   11:26 AM  PFT Results  FVC-Pre L 2.52  2.22  P  FVC-Predicted Pre % 113  98  P  FVC-Post L  2.20  P  FVC-Predicted  Post %  97  P  Pre FEV1/FVC % % 71  84  P  Post FEV1/FCV % %  85  P  FEV1-Pre L 1.78  1.86  P  FEV1-Predicted Pre % 109  112  P  FEV1-Post L  1.87  P  DLCO uncorrected ml/min/mmHg 10.10  7.49  P  DLCO UNC% % 60  44  P  DLVA Predicted % 61  51  P  TLC L 4.29  3.83  P  TLC % Predicted % 93  83  P  RV % Predicted % 72  81  P    P Preliminary result     Assessment & Plan:  Rebecca Gutierrez is an 83 year old female patient with a past medical history of GERD, hiatal hernia, hyperlipidemia and hypertension presenting to the pulmonary clinic as a referral from her primary care physician for chronic cough.  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 Echo with mildly increased DLCO. cardiology eval. WHO  fc II.   Symptoms include mainly cough with dyspnea on exertion.   []  Repeat HRCT and PFT end of April early may.  []  Advised her to taper off her inhaler and if symptoms recur than we should restart it.  []  Follows with Dr. Tobie and is currently weaned off MMF. On prednisone  taper.  []  Referral to cardiology for RHC placed. []  Agree PPI and H2 blocker per GI   No follow-ups on file.  I spent 30 minutes caring for this patient today, including preparing to see the patient, obtaining a medical history , reviewing a separately obtained history, performing a medically appropriate examination and/or evaluation, ordering medications, tests, or procedures, referring and communicating with other health care professionals (not separately reported), documenting clinical information in the electronic health record, and independently interpreting results (not separately reported/billed) and communicating results to the patient/family/caregiver  Darrin Barn, MD [facility] 12/06/2023 11:42 AM

## 2023-12-06 NOTE — Progress Notes (Unsigned)
 Synopsis: Referred in by Justus Leita DEL, MD   Subjective:   PATIENT ID: Rebecca Gutierrez GENDER: female DOB: [birth year], MRN: [id number]  Chief Complaint  Patient presents with   Follow-up    3 month follow up, mixed connective tissue disease, SOB, CT: 10/18/23, PFT: 12/02/23  Breathing has been ok, but still has a persistent wet cough and causes her to be a bit hoarse. Otherwise no complaints. Still using her Symbicort  as prescribed -- previously didn't seem to be helping but in the past couple days it seems to be more effective.    HPI Rebecca Gutierrez is an 83 year old female patient with a past medical history of GERD, hiatal hernia, hyperlipidemia and hypertension presenting to the pulmonary clinic as a referral from her primary care physician for chronic cough.  She said that back in May her cough started to worsen now it is soon as she vocalizes she starts coughing her is dry and associated with shortness of breath specifically on exertion.  She denies any heartburn denies acid taste in mouth and denies postnasal drip.  She denies any history of asthma or any trigger to the cough.  She denies any chest wheezing or chest tightness.  She saw ENT specialist underwent a laryngoscopy did not find any abnormalities.  She does follow-up with a gastroenterologist and have her on Protonix  40 mg twice daily as well as famotidine  20 mg twice daily.  PFTs with normal spirometry, normal lung volumes and moderate to severely reduced DLCO. Suggestive of PVD. PEFR was increased consistent with possible ILD.   High res CT chest 04/08/2023 - Small to moderate enlarged meds LN  with diffuse perilymphatic nodularity suggestive of sarcoidosis.   ANA 1:1280 - ScL70 48 - Anti sm 76 - Anti U1 RNP 76    She saw Dr. Tobie at [facility] and was started on MMF and Prednisone  and have not felt any different.   She also reports no difference with the inhaler and is asking to be tapered off.   She denies any systemic  symptoms including dysphagia, raynaud, joint issues, dry mouth or eyes. Denies any rashes.   Family history she denies any family history of pulmonary diseases.  Social history never smoker drinks alcohol occasionally no illicit drug use and no pets at home.  ROS All systems were reviewed and are negative except for the above.  Objective:   Vitals:   12/06/23 1125  BP: (!) 140/70  Pulse: 81  Temp: 98.5 F (36.9 C)  TempSrc: Oral  SpO2: 96%  Weight: 130 lb 6.4 oz (59.1 kg)  Height: 5' 1 (1.549 m)   96% on RA BMI Readings from Last 3 Encounters:  12/06/23 24.64 kg/m  12/02/23 24.56 kg/m  11/17/23 24.94 kg/m   Wt Readings from Last 3 Encounters:  12/06/23 130 lb 6.4 oz (59.1 kg)  12/02/23 130 lb (59 kg)  11/17/23 132 lb (59.9 kg)    Physical Exam GEN: NAD, Healthy Appearing HEENT: Supple Neck, Reactive Pupils, EOMI  CVS: Normal S1, Normal S2, RRR, No murmurs or ES appreciated  Lungs: Clear bilateral air entry.  Abdomen: Soft, non tender, non distended, + BS  Extremities: Warm and well perfused, No edema  Skin: No suspicious lesions appreciated  Psych: Normal Affect  Ancillary Information   CBC    Component Value Date/Time   WBC 8.3 11/01/2023 1101   WBC 3.4 (L) 03/29/2015 0912   RBC 4.54 11/01/2023 1101   RBC 5 01/13/2018 1147  RBC 4.49 03/29/2015 0912   HGB 13.7 11/01/2023 1101   HCT 41.8 11/01/2023 1101   PLT 246 11/01/2023 1101   MCV 92 11/01/2023 1101   MCV 91 10/13/2011 1515   MCH 30.2 11/01/2023 1101   MCH 31.2 03/29/2015 0912   MCHC 32.8 11/01/2023 1101   MCHC 35.6 03/29/2015 0912   RDW 12.4 11/01/2023 1101   RDW 13.3 10/13/2011 1515   LYMPHSABS 1.0 11/01/2023 1101   MONOABS 0.3 03/29/2015 0912   EOSABS 0.2 11/01/2023 1101   BASOSABS 0.1 11/01/2023 1101       Latest Ref Rng & Units 12/02/2023   10:51 AM 04/13/2023   11:26 AM  PFT Results  FVC-Pre L 2.52  2.22  P  FVC-Predicted Pre % 113  98  P  FVC-Post L  2.20  P  FVC-Predicted  Post %  97  P  Pre FEV1/FVC % % 71  84  P  Post FEV1/FCV % %  85  P  FEV1-Pre L 1.78  1.86  P  FEV1-Predicted Pre % 109  112  P  FEV1-Post L  1.87  P  DLCO uncorrected ml/min/mmHg 10.10  7.49  P  DLCO UNC% % 60  44  P  DLVA Predicted % 61  51  P  TLC L 4.29  3.83  P  TLC % Predicted % 93  83  P  RV % Predicted % 72  81  P    P Preliminary result     Assessment & Plan:  [name] is an 83 year old female patient with a past medical history of GERD, hiatal hernia, hyperlipidemia and hypertension presenting to the pulmonary clinic as a referral from her primary care physician for chronic cough.  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 Echo with mildly increased DLCO. cardiology eval. WHO  fc II.   Symptoms include mainly cough with dyspnea on exertion.   []  Repeat HRCT and PFT end of April early may.  []  Advised her to taper off her inhaler and if symptoms recur than we should restart it.  []  Follows with Dr. Tobie and is currently weaned off MMF. On prednisone  taper.  []  Referral to cardiology for RHC placed. []  Agree PPI and H2 blocker per GI   No follow-ups on file.  I spent 30 minutes caring for this patient today, including preparing to see the patient, obtaining a medical history , reviewing a separately obtained history, performing a medically appropriate examination and/or evaluation, ordering medications, tests, or procedures, referring and communicating with other health care professionals (not separately reported), documenting clinical information in the electronic health record, and independently interpreting results (not separately reported/billed) and communicating results to the patient/family/caregiver  Darrin Barn, MD [facility] 12/06/2023 11:42 AM

## 2023-12-08 ENCOUNTER — Ambulatory Visit (INDEPENDENT_AMBULATORY_CARE_PROVIDER_SITE_OTHER): Admitting: Emergency Medicine

## 2023-12-08 ENCOUNTER — Encounter: Payer: Self-pay | Admitting: Pulmonary Disease

## 2023-12-08 ENCOUNTER — Telehealth: Payer: Self-pay

## 2023-12-08 ENCOUNTER — Ambulatory Visit (INDEPENDENT_AMBULATORY_CARE_PROVIDER_SITE_OTHER): Admitting: Pulmonary Disease

## 2023-12-08 VITALS — BP 138/72 | HR 75 | Temp 97.1°F | Ht 61.0 in | Wt 129.8 lb

## 2023-12-08 VITALS — Ht 61.0 in | Wt 129.0 lb

## 2023-12-08 DIAGNOSIS — R0602 Shortness of breath: Secondary | ICD-10-CM

## 2023-12-08 DIAGNOSIS — Z0000 Encounter for general adult medical examination without abnormal findings: Secondary | ICD-10-CM | POA: Diagnosis not present

## 2023-12-08 MED ORDER — FLUTICASONE-SALMETEROL 230-21 MCG/ACT IN AERO
2.0000 | INHALATION_SPRAY | Freq: Two times a day (BID) | RESPIRATORY_TRACT | 12 refills | Status: DC
Start: 2023-12-08 — End: 2024-03-27

## 2023-12-08 NOTE — Patient Instructions (Signed)
 [name] , Thank you for taking time out of your busy schedule to complete your Annual Wellness Visit with me. I enjoyed our conversation and look forward to speaking with you again next year. I, as well as your care team,  appreciate your ongoing commitment to your health goals. Please review the following plan we discussed and let me know if I can assist you in the future. Your Game plan/ To Do List    Referrals: None   Follow up Visits: Next Medicare AWV with our clinical staff: 12/13/24 @ 4:00pm (PHONE VISIT)   Have you seen your provider in the last 6 months (3 months if uncontrolled diabetes)? Yes Next Office Visit with your provider: 05/03/24 @ 9:40am with Dr. Justus  Clinician Recommendations:  Please call [phone] to schedule your mammogram (due 12/29/23) at your earliest convenience. Get the covid an flu vaccines in the fall.  Aim for 30 minutes of exercise or brisk walking, 6-8 glasses of water , and 5 servings of fruits and vegetables each day.       This is a list of the screening recommended for you and due dates:  Health Maintenance  Topic Date Due   COVID-19 Vaccine (6 - Pfizer risk 2024-25 season) 10/20/2023   Mammogram  12/28/2023   Flu Shot  01/14/2024   DTaP/Tdap/Td vaccine (5 - Tdap) 03/14/2024   Medicare Annual Wellness Visit  12/07/2024   DEXA scan (bone density measurement)  12/19/2025   Pneumococcal Vaccine for age over 8  Completed   Zoster (Shingles) Vaccine  Completed   Hepatitis B Vaccine  Aged Out   HPV Vaccine  Aged Out   Meningitis B Vaccine  Aged Out   Hepatitis C Screening  Discontinued    Advanced directives: (In Chart) A copy of your advanced directives are scanned into your chart should your provider ever need it. Advance Care Planning is important because it:  [x]  Makes sure you receive the medical care that is consistent with your values, goals, and preferences  [x]  It provides guidance to your family and loved ones and reduces their  decisional burden about whether or not they are making the right decisions based on your wishes.  Follow the link provided in your after visit summary or read over the paperwork we have mailed to you to help you started getting your Advance Directives in place. If you need assistance in completing these, please reach out to us  so that we can help you!  See attachments for Preventive Care and Fall Prevention Tips.   Fall Prevention in the Home, Adult Falls can cause injuries and affect people of all ages. There are many simple things that you can do to make your home safe and to help prevent falls. If you need it, ask for help making these changes. What actions can I take to prevent falls? General information Use good lighting in all rooms. Make sure to: Replace any light bulbs that burn out. Turn on lights if it is dark and use night-lights. Keep items that you use often in easy-to-reach places. Lower the shelves around your home if needed. Move furniture so that there are clear paths around it. Do not keep throw rugs or other things on the floor that can make you trip. If any of your floors are uneven, fix them. Add color or contrast paint or tape to clearly mark and help you see: Grab bars or handrails. First and last steps of staircases. Where the edge of each step  is. If you use a ladder or stepladder: Make sure that it is fully opened. Do not climb a closed ladder. Make sure the sides of the ladder are locked in place. Have someone hold the ladder while you use it. Know where your pets are as you move through your home. What can I do in the bathroom?     Keep the floor dry. Clean up any water  that is on the floor right away. Remove soap buildup in the bathtub or shower. Buildup makes bathtubs and showers slippery. Use non-skid mats or decals on the floor of the bathtub or shower. Attach bath mats securely with double-sided, non-slip rug tape. If you need to sit down while you  are in the shower, use a non-slip stool. Install grab bars by the toilet and in the bathtub and shower. Do not use towel bars as grab bars. What can I do in the bedroom? Make sure that you have a light by your bed that is easy to reach. Do not use any sheets or blankets on your bed that hang to the floor. Have a firm bench or chair with side arms that you can use for support when you get dressed. What can I do in the kitchen? Clean up any spills right away. If you need to reach something above you, use a sturdy step stool that has a grab bar. Keep electrical cables out of the way. Do not use floor polish or wax that makes floors slippery. What can I do with my stairs? Do not leave anything on the stairs. Make sure that you have a light switch at the top and the bottom of the stairs. Have them installed if you do not have them. Make sure that there are handrails on both sides of the stairs. Fix handrails that are broken or loose. Make sure that handrails are as long as the staircases. Install non-slip stair treads on all stairs in your home if they do not have carpet. Avoid having throw rugs at the top or bottom of stairs, or secure the rugs with carpet tape to prevent them from moving. Choose a carpet design that does not hide the edge of steps on the stairs. Make sure that carpet is firmly attached to the stairs. Fix any carpet that is loose or worn. What can I do on the outside of my home? Use bright outdoor lighting. Repair the edges of walkways and driveways and fix any cracks. Clear paths of anything that can make you trip, such as tools or rocks. Add color or contrast paint or tape to clearly mark and help you see high doorway thresholds. Trim any bushes or trees on the main path into your home. Check that handrails are securely fastened and in good repair. Both sides of all steps should have handrails. Install guardrails along the edges of any raised decks or porches. Have leaves,  snow, and ice cleared regularly. Use sand, salt, or ice melt on walkways during winter months if you live where there is ice and snow. In the garage, clean up any spills right away, including grease or oil spills. What other actions can I take? Review your medicines with your health care provider. Some medicines can make you confused or feel dizzy. This can increase your chance of falling. Wear closed-toe shoes that fit well and support your feet. Wear shoes that have rubber soles and low heels. Use a cane, walker, scooter, or crutches that help you move around if needed. Talk  with your provider about other ways that you can decrease your risk of falls. This may include seeing a physical therapist to learn to do exercises to improve movement and strength. Where to find more information Centers for Disease Control and Prevention, STEADI: TonerPromos.no [employer] on Aging: BaseRingTones.pl National Institute on Aging: BaseRingTones.pl Contact a health care provider if: You are afraid of falling at home. You feel weak, drowsy, or dizzy at home. You fall at home. Get help right away if you: Lose consciousness or have trouble moving after a fall. Have a fall that causes a head injury. These symptoms may be an emergency. Get help right away. Call 911. Do not wait to see if the symptoms will go away. Do not drive yourself to the hospital. This information is not intended to replace advice given to you by your health care provider. Make sure you discuss any questions you have with your health care provider. Document Revised: 02/02/2022 Document Reviewed: 02/02/2022 Elsevier Patient Education  2024 [employer].

## 2023-12-08 NOTE — Telephone Encounter (Signed)
 Noted. Nothing further needed.

## 2023-12-08 NOTE — Telephone Encounter (Signed)
 Bronchoscopy with EBUS 12/21/2023 at 11:15am Sarcoidosis 31622, 31652, [id number]   Rebecca Gutierrez please see Bronch info.  Patient is aware of date and time. Bronch email has been sent.

## 2023-12-08 NOTE — Progress Notes (Signed)
 Subjective:   Rebecca Gutierrez is a 83 y.o. who presents for a Medicare Wellness preventive visit.  As a reminder, Annual Wellness Visits don't include a physical exam, and some assessments may be limited, especially if this visit is performed virtually. We may recommend an in-person follow-up visit with your provider if needed.  Visit Complete: Virtual I connected with  Breeona Waid Yeo on 12/08/23 by a audio enabled telemedicine application and verified that I am speaking with the correct person using two identifiers.  Patient Location: Home  Provider Location: Home Office  I discussed the limitations of evaluation and management by telemedicine. The patient expressed understanding and agreed to proceed.  Vital Signs: Because this visit was a virtual/telehealth visit, some criteria may be missing or patient reported. Any vitals not documented were not able to be obtained and vitals that have been documented are patient reported.  VideoDeclined- This patient declined Librarian, academic. Therefore the visit was completed with audio only.  Persons Participating in Visit: Patient.  AWV Questionnaire: Yes: Patient Medicare AWV questionnaire was completed by the patient on 12/07/23; I have confirmed that all information answered by patient is correct and no changes since this date.  Cardiac Risk Factors include: advanced age (>39men, >28 women);dyslipidemia;hypertension     Objective:    Today's Vitals   12/08/23 1558  Weight: 129 lb (58.5 kg)  Height: 5' 1 (1.549 m)   Body mass index is 24.37 kg/m.     12/08/2023    4:12 PM 11/04/2022   10:09 AM 10/22/2021    9:57 AM 10/21/2021    7:14 AM 10/21/2020    2:22 PM 10/18/2019    1:30 PM 10/10/2018    9:33 AM  Advanced Directives  Does Patient Have a Medical Advance Directive? Yes Yes Yes Yes Yes Yes Yes  Type of Estate agent of [place];Living will Healthcare Power of  [place];Living will Healthcare Power of [place];Living will Healthcare Power of [place];Living will Healthcare Power of [place];Living will Healthcare Power of [place];Living will Living will;Healthcare Power of Attorney  Does patient want to make changes to medical advance directive? No - Patient declined No - Patient declined       Copy of Healthcare Power of Attorney in Chart? Yes - validated most recent copy scanned in chart (See row information) Yes - validated most recent copy scanned in chart (See row information) Yes - validated most recent copy scanned in chart (See row information) No - copy requested No - copy requested No - copy requested No - copy requested      Data saved with a previous flowsheet row definition    Current Medications (verified) Outpatient Encounter Medications as of 12/08/2023  Medication Sig   aspirin EC 81 MG tablet Take 81 mg by mouth at bedtime.    Cholecalciferol 50 MCG (2000 UT) TABS Take 1 capsule by mouth in the morning.   estradiol  (ESTRACE  VAGINAL) 0.1 MG/GM vaginal cream Apply 0.5mg  (pea-sized amount)  just inside the vaginal introitus with a finger-tip on Monday, Wednesday and Friday nights.   famotidine  (PEPCID ) 20 MG tablet TAKE 1 TABLET BY MOUTH TWICE  DAILY (Patient taking differently: Take 20 mg by mouth daily.)   fluticasone-salmeterol (ADVAIR HFA) 230-21 MCG/ACT inhaler Inhale 2 puffs into the lungs 2 (two) times daily.   hydrochlorothiazide  (HYDRODIURIL ) 25 MG tablet TAKE 1 TABLET BY MOUTH DAILY   metoprolol  tartrate (LOPRESSOR ) 25 MG tablet TAKE 1 AND 1/2 TABLETS BY MOUTH  TWICE DAILY   nitrofurantoin  (MACRODANTIN ) 100 MG capsule Take 1 capsule (100 mg total) by mouth at bedtime.   simvastatin  (ZOCOR ) 10 MG tablet Take 1 tablet (10 mg total) by mouth daily.   budesonide -formoterol  (SYMBICORT ) 160-4.5 MCG/ACT inhaler Inhale 2 puffs into the lungs in the morning and at bedtime. (Patient not taking: Reported on 12/08/2023)   gabapentin  (NEURONTIN) 100 MG capsule Take 100 mg by mouth 2 (two) times daily. (Patient not taking: Reported on 12/08/2023)   mycophenolate (CELLCEPT) 500 MG tablet Take 500 mg by mouth daily. (Patient not taking: Reported on 12/08/2023)   No facility-administered encounter medications on file as of 12/08/2023.    Allergies (verified) Ciprofloxacin , Levaquin  [levofloxacin ], Potassium clavulanate [clavulanic acid], [name], and [name]    History: Past Medical History:  Diagnosis Date   Abnormal ECG 10/28/2017   Allergy    Anxiety    Bradycardia 11/10/2017   Cancer (HCC)    skin ca   Cystocele 09/13/2013   small recurrent, less than before 09/13/13 JVF    GERD (gastroesophageal reflux disease)    Gilbert's syndrome    Hearing loss 1975   History of recurrent urinary tract infection 03/24/2016   Hyperlipidemia 2000   Hypertension 2000   Recurrent UTI 03/10/2015   Past Surgical History:  Procedure Laterality Date   ABDOMINAL HYSTERECTOMY     ANTERIOR AND POSTERIOR REPAIR N/A 05/02/2013   Procedure: ANTERIOR (CYSTOCELE) AND POSTERIOR REPAIR (RECTOCELE);  Surgeon: Norleen LULLA Server, MD;  Location: [facility] OR[facility];  Service: Gynecology;  Laterality: N/A;   BRAVO PH STUDY N/A 10/21/2021   Procedure: BRAVO PH STUDY;  Surgeon: Jinny Carmine, MD;  Location[facility] ENDOSCOPY;  Service: Endoscopy;  Laterality: N/A;   CATARACT EXTRACTION, BILATERAL     CHOLECYSTECTOMY  10/15/2020   COLONOSCOPY  08/28/2011   Procedure: COLONOSCOPY;  Surgeon: Claudis RAYMOND Rivet, MD;  Location: [facility] ENDO SUITE;  Service: Endoscopy;  Laterality: N/A;  930   ESOPHAGOGASTRODUODENOSCOPY N/A 10/21/2021   Procedure: ESOPHAGOGASTRODUODENOSCOPY (EGD);  Surgeon: Jinny Carmine, MD;  Location: [facility] ENDOSCOPY;  Service: Endoscopy;  Laterality: N/A;   EYE SURGERY Bilateral 02/13/2011   other 2013   MOHS SURGERY  06/2016   Removed skin cancer from Rt shin   TUBAL LIGATION     VAGINAL HYSTERECTOMY N/A 05/02/2013    Procedure: HYSTERECTOMY VAGINAL;  Surgeon: Norleen LULLA Server, MD;  Location: [facility]S;  Service: Gynecology;  Laterality: N/A;   Family History  Problem Relation Age of Onset   Hypertension Mother    Cancer Mother        stomach   Healthy Father    Healthy Brother    Colon cancer Neg Hx    Prostate cancer Neg Hx    Kidney cancer Neg Hx    Bladder Cancer Neg Hx    Breast cancer Neg Hx    Heart disease Neg Hx    Social History   Socioeconomic History   Marital status: Widowed    Spouse name: Not on file   Number of children: 2   Years of education: 67   Highest education level: 12th grade  Occupational History   Occupation: Retired  Tobacco Use   Smoking status: Never   Smokeless tobacco: Never  Vaping Use   Vaping status: Never Used  Substance and Sexual Activity   Alcohol use: Not Currently    Alcohol/week: 1.0 standard drink of alcohol    Types: 1 Standard drinks or equivalent per week   Drug use: Never  Sexual activity: Not Currently    Partners: Male    Birth control/protection: Post-menopausal, Surgical  Other Topics Concern   Not on file  Social History Narrative   Pt lives alone   Social Drivers of Health   Financial Resource Strain: Low Risk  (12/08/2023)   Overall Financial Resource Strain (CARDIA)    Difficulty of Paying Living Expenses: Not hard at all  Food Insecurity: No Food Insecurity (12/08/2023)   Hunger Vital Sign    Worried About Running Out of Food in the Last Year: Never true    Ran Out of Food in the Last Year: Never true  Transportation Needs: No Transportation Needs (12/08/2023)   PRAPARE - Administrator, Civil Service (Medical): No    Lack of Transportation (Non-Medical): No  Physical Activity: Inactive (12/08/2023)   Exercise Vital Sign    Days of Exercise per Week: 0 days    Minutes of Exercise per Session: 0 min  Stress: No Stress Concern Present (12/08/2023)   [employer] of Occupational Health - Occupational Stress  Questionnaire    Feeling of Stress: Not at all  Social Connections: Socially Isolated (12/08/2023)   Social Connection and Isolation Panel    Frequency of Communication with Friends and Family: More than three times a week    Frequency of Social Gatherings with Friends and Family: More than three times a week    Attends Religious Services: Never    Database administrator or Organizations: No    Attends Banker Meetings: Never    Marital Status: Widowed    Tobacco Counseling Counseling given: Not Answered    Clinical Intake:  Pre-visit preparation completed: Yes  Pain : No/denies pain     BMI - recorded: 24.37 Nutritional Status: BMI of 19-24  Normal Nutritional Risks: None Diabetes: No  Lab Results  Component Value Date   HGBA1C 5.5 11/01/2023   HGBA1C 5.8 (H) 10/30/2022   HGBA1C 5.6 10/30/2021     How often do you need to have someone help you when you read instructions, pamphlets, or other written materials from your doctor or pharmacy?: 1 - Never  Interpreter Needed?: No  Information entered by :: Vina Ned, CMA   Activities of Daily Living     12/08/2023    4:00 PM 12/07/2023   12:44 PM  In your present state of health, do you have any difficulty performing the following activities:  Hearing? 1 1  Comment wears hearing aids   Vision? 0 0  Difficulty concentrating or making decisions? 0 0  Walking or climbing stairs? 0 0  Dressing or bathing? 0 0  Doing errands, shopping? 0 0  Preparing Food and eating ? N N  Using the Toilet? N N  In the past six months, have you accidently leaked urine? N N  Do you have problems with loss of bowel control? N N  Managing your Medications? N N  Managing your Finances? N N  Housekeeping or managing your Housekeeping? N N    Patient Care Team: Justus Leita DEL, MD as PCP - General (Internal Medicine) Gaston Hamilton, MD as Consulting Physician (Urology) Malka Domino, MD as Consulting  Physician (Pulmonary Disease) [facility] (Optometry) Darliss Rogue, MD as Consulting Physician (Cardiology) Tobie Lady POUR, MD as Consulting Physician (Rheumatology)  I have updated your Care Teams any recent Medical Services you may have received from other providers in the past year.     Assessment:  This is a routine wellness examination for Al[place].  Hearing/Vision screen Hearing Screening - Comments:: Wears hearing aids Vision Screening - Comments:: Gets routine eye exams, [facility] [place]   Goals Addressed             This Visit's Progress    Patient Stated       Maintain current health and activity       Depression Screen     12/08/2023    4:09 PM 11/01/2023   10:10 AM 05/03/2023    9:59 AM 03/02/2023   10:16 AM 12/31/2022   11:18 AM 11/04/2022   10:07 AM 10/30/2022    9:31 AM  PHQ 2/9 Scores  PHQ - 2 Score 0 0 0 0 0 0 0  PHQ- 9 Score 1 0 0 0 1 0 0    Fall Risk     12/08/2023    4:13 PM 12/07/2023   12:44 PM 11/01/2023   10:10 AM 05/03/2023    9:59 AM 03/02/2023   10:16 AM  Fall Risk   Falls in the past year? 0 0 0 0 0  Number falls in past yr: 0 0 0 0 0  Injury with Fall? 0 0 0 0 0  Risk for fall due to : No Fall Risks  No Fall Risks No Fall Risks No Fall Risks  Follow up Falls evaluation completed  Falls evaluation completed Falls evaluation completed Falls evaluation completed    MEDICARE RISK AT HOME:  Medicare Risk at Home Any stairs in or around the home?: No If so, are there any without handrails?: No Home free of loose throw rugs in walkways, pet beds, electrical cords, etc?: Yes Adequate lighting in your home to reduce risk of falls?: Yes Life alert?: No Use of a cane, walker or w/c?: No Grab bars in the bathroom?: No Shower chair or bench in shower?: No Elevated toilet seat or a handicapped toilet?: No  TIMED UP AND GO:  Was the test performed?  No  Cognitive Function: 6CIT completed         12/08/2023    4:14 PM 11/04/2022   10:13 AM 10/10/2018    9:39 AM 10/04/2017    9:16 AM 09/29/2016    9:40 AM  6CIT Screen  What Year? 0 points 0 points 0 points 0 points 0 points  What month? 0 points 0 points 0 points 0 points 0 points  What time? 0 points 0 points 0 points 0 points 0 points  Count back from 20 0 points 0 points 0 points 0 points 0 points  Months in reverse 0 points 0 points 0 points 0 points 0 points  Repeat phrase 0 points 0 points 0 points 0 points 0 points  Total Score 0 points 0 points 0 points 0 points 0 points    Immunizations Immunization History  Administered Date(s) Administered   Fluad Quad(high Dose 65+) 04/18/2019, 04/29/2020, 04/23/2022   Fluad Trivalent(High Dose 65+) 05/03/2023   Influenza Split 04/01/2015   Influenza Whole 03/25/2006, 05/25/2011   Influenza, High Dose Seasonal PF 03/31/2017, 04/22/2018, 03/19/2021   Influenza,inj,Quad PF,6+ Mos 03/01/2013, 03/14/2014, 03/24/2016   PFIZER(Purple Top)SARS-COV-2 Vaccination 06/29/2019, 07/20/2019, 04/03/2020   PNEUMOCOCCAL CONJUGATE-20 03/02/2023   Pfizer Covid-19 Vaccine Bivalent Booster 75yrs & up 03/11/2021   Pfizer(Comirnaty)Fall Seasonal Vaccine 12 years and older 04/22/2023   Pneumococcal Conjugate-13 09/17/2014   Pneumococcal Polysaccharide-23 09/23/2009   Td 02/04/2004, 03/14/2014   Td (Adult), 2 Lf Tetanus Toxid, Preservative Free  02/04/2004, 03/14/2014   Zoster Recombinant(Shingrix) 01/09/2019, 05/18/2019   Zoster, Live 07/17/2010    Screening Tests Health Maintenance  Topic Date Due   COVID-19 Vaccine (6 - Pfizer risk 2024-25 season) 10/20/2023   MAMMOGRAM  12/28/2023   INFLUENZA VACCINE  01/14/2024   DTaP/Tdap/Td (5 - Tdap) 03/14/2024   Medicare Annual Wellness (AWV)  12/07/2024   DEXA SCAN  12/19/2025   Pneumococcal Vaccine: 50+ Years  Completed   Zoster Vaccines- Shingrix  Completed   Hepatitis B Vaccines  Aged Out   HPV VACCINES  Aged Out   Meningococcal B Vaccine   Aged Out   Hepatitis C Screening  Discontinued    Health Maintenance  Health Maintenance Due  Topic Date Due   COVID-19 Vaccine (6 - Pfizer risk 2024-25 season) 10/20/2023   Health Maintenance Items Addressed: See Nurse Notes at the end of this note  Additional Screening:  Vision Screening: Recommended annual ophthalmology exams for early detection of glaucoma and other disorders of the eye. Would you like a referral to an eye doctor? No    Dental Screening: Recommended annual dental exams for proper oral hygiene  Community Resource Referral / Chronic Care Management: CRR required this visit?  No   CCM required this visit?  No   Plan:    I have personally reviewed and noted the following in the patient's chart:   Medical and social history Use of alcohol, tobacco or illicit drugs  Current medications and supplements including opioid prescriptions. Patient is not currently taking opioid prescriptions. Functional ability and status Nutritional status Physical activity Advanced directives List of other physicians Hospitalizations, surgeries, and ER visits in previous 12 months Vitals Screenings to include cognitive, depression, and falls Referrals and appointments  In addition, I have reviewed and discussed with patient certain preventive protocols, quality metrics, and best practice recommendations. A written personalized care plan for preventive services as well as general preventive health recommendations were provided to patient.   Vina Ned, CMA   12/08/2023   After Visit Summary: (MyChart) Due to this being a telephonic visit, the after visit summary with patients personalized plan was offered to patient via MyChart   Notes:  Gave phone # to schedule [facility] (due ~12/28/23 (order already placed) Get Covid and flu vaccines in the fall

## 2023-12-08 NOTE — Telephone Encounter (Signed)
 For the codes 68377, [id number], [phone] Prior Auth Not Required Refer # [id number]

## 2023-12-08 NOTE — Progress Notes (Signed)
 Scheduling for EBUS and Bronchoscopy on 12/21/2023.   Switching to high dose ICS-LABA and discontinue Gabapentin.   Darrin Barn, MD  Pulmonary Critical Care 12/08/2023 11:59 AM

## 2023-12-15 ENCOUNTER — Encounter: Payer: Self-pay | Admitting: Pulmonary Disease

## 2023-12-15 ENCOUNTER — Encounter
Admission: RE | Admit: 2023-12-15 | Discharge: 2023-12-15 | Disposition: A | Discharge status: Home / Self Care | Source: Ambulatory Visit | Attending: Pulmonary Disease | Admitting: Pulmonary Disease

## 2023-12-15 ENCOUNTER — Telehealth: Payer: Self-pay

## 2023-12-15 ENCOUNTER — Other Ambulatory Visit: Payer: Self-pay

## 2023-12-15 DIAGNOSIS — I10 Essential (primary) hypertension: Secondary | ICD-10-CM

## 2023-12-15 DIAGNOSIS — Z01812 Encounter for preprocedural laboratory examination: Secondary | ICD-10-CM

## 2023-12-15 DIAGNOSIS — Z79899 Other long term (current) drug therapy: Secondary | ICD-10-CM

## 2023-12-15 HISTORY — DX: Fatty (change of) liver, not elsewhere classified: K76.0

## 2023-12-15 HISTORY — DX: Other disorders of bilirubin metabolism: E80.6

## 2023-12-15 HISTORY — DX: Atherosclerosis of aorta: I70.0

## 2023-12-15 HISTORY — DX: Dyspnea, unspecified: R06.00

## 2023-12-15 HISTORY — DX: Interstitial pulmonary disease, unspecified: J84.9

## 2023-12-15 HISTORY — DX: Atherosclerotic heart disease of native coronary artery without angina pectoris: I25.10

## 2023-12-15 HISTORY — DX: Chronic cough: R05.3

## 2023-12-15 HISTORY — DX: Systemic lupus erythematosus, unspecified: M32.9

## 2023-12-15 HISTORY — DX: Personal history of other diseases of the digestive system: Z87.19

## 2023-12-15 HISTORY — DX: Dysphonia: R49.0

## 2023-12-15 HISTORY — DX: Ventricular premature depolarization: I49.3

## 2023-12-15 HISTORY — DX: Cystocele, midline: N81.11

## 2023-12-15 HISTORY — DX: Sarcoidosis, unspecified: D86.9

## 2023-12-15 NOTE — Patient Instructions (Signed)
 Your procedure is scheduled on:12-21-23 Tuesday Report to the Registration Desk on the 1st floor of the [facility].Then proceed to the 2nd floor Surgery Desk To find out your arrival time, please call [phone] between 1PM - 3PM on:12-20-23 Monday If your arrival time is 6:00 am, do not arrive before that time as the [facility] entrance doors do not open until 6:00 am.  REMEMBER: Instructions that are not followed completely may result in serious medical risk, up to and including death; or upon the discretion of your surgeon and anesthesiologist your surgery may need to be rescheduled.  Do not eat food OR drink any liquids after midnight the night before surgery.  No gum chewing or hard candies.  One week prior to surgery:Stop NOW (12-15-23) Stop Anti-inflammatories (NSAIDS) such as Advil, Aleve, Ibuprofen, Motrin, Naproxen, Naprosyn and Aspirin based products such as Excedrin, Goody's Powder, BC Powder. Stop ANY OVER THE COUNTER supplements until after surgery (Vitamin D )  You may however, continue to take Tylenol  if needed for pain up until the day of surgery.  Continue taking all of your other prescription medications up until the day of surgery.  ON THE DAY OF SURGERY ONLY TAKE THESE MEDICATIONS WITH SIPS OF WATER : -metoprolol  tartrate (LOPRESSOR )   Use your Advair Inhaler the morning of surgery  Continue your 81 mg Aspirin up until the day prior to surgery-Do NOT take the morning of surgery  No Alcohol for 24 hours before or after surgery.  No Smoking including e-cigarettes for 24 hours before surgery.  No chewable tobacco products for at least 6 hours before surgery.  No nicotine patches on the day of surgery.  Do not use any recreational drugs for at least a week (preferably 2 weeks) before your surgery.  Please be advised that the combination of cocaine and anesthesia may have negative outcomes, up to and including death. If you test positive for cocaine, your surgery  will be cancelled.  On the morning of surgery brush your teeth with toothpaste and water , you may rinse your mouth with mouthwash if you wish. Do not swallow any toothpaste or mouthwash.  Do not wear jewelry, make-up, hairpins, clips or nail polish.  For welded (permanent) jewelry: bracelets, anklets, waist bands, etc.  Please have this removed prior to surgery.  If it is not removed, there is a chance that hospital personnel will need to cut it off on the day of surgery.  Do not wear lotions, powders, or perfumes.   Do not shave body hair from the neck down 48 hours before surgery.  Contact lenses, hearing aids and dentures may not be worn into surgery.  Do not bring valuables to the hospital. [facility] is not responsible for any missing/lost belongings or valuables.   Notify your doctor if there is any change in your medical condition (cold, fever, infection).  Wear comfortable clothing (specific to your surgery type) to the hospital.  After surgery, you can help prevent lung complications by doing breathing exercises.  Take deep breaths and cough every 1-2 hours. Your doctor may order a device called an Incentive Spirometer to help you take deep breaths. When coughing or sneezing, hold a pillow firmly against your incision with both hands. This is called "splinting." Doing this helps protect your incision. It also decreases belly discomfort.  If you are being admitted to the hospital overnight, leave your suitcase in the car. After surgery it may be brought to your room.  In case of increased patient  census, it may be necessary for you, the patient, to continue your postoperative care in the Same Day Surgery department.  If you are being discharged the day of surgery, you will not be allowed to drive home. You will need a responsible individual to drive you home and stay with you for 24 hours after surgery.   If you are taking public transportation, you will need to have a  responsible individual with you.  Please call the Pre-admissions Testing Dept. at [phone] if you have any questions about these instructions.  Surgery Visitation Policy:  Patients having surgery or a procedure may have two visitors.  Children under the age of 6 must have an adult with them who is not the patient.   Merchandiser, retail to address health-related social needs:  [email or web address]

## 2023-12-15 NOTE — Telephone Encounter (Signed)
-----   Message from Rebecca Gutierrez sent at 12/15/2023 11:01 AM EDT ----- Regarding: Request for pre-operative cardiac clearance Request for pre-operative cardiac clearance:  1. What type of surgery is being performed?  BRONCHOSCOPY WITH EBUS  2. When is this surgery scheduled?  12/21/2023  3. Type of clearance being requested (medical, pharmacy, both)? MEDICAL   4. Are there any medications that need to be held prior to surgery? N/A - patient to continue daily LOW DOSE ASPIRIN throughout the perioperative course  5. Practice name and name of physician performing surgery?  Performing surgeon: Dr. Darrin Barn, MD Requesting clearance: Rebecca Pereyra, FNP-C    6. Anesthesia type (none, local, MAC, general)? GENERAL  7. What is the office phone and fax number?   Fax: [phone]  ATTENTION: Unable to create telephone message as per your standard workflow. Directed by [facility] providers to send requests for cardiac clearance to this pool for appropriate distribution to provider covering pre-operative clearances.   Rebecca Pereyra, MSN, APRN, FNP-C, CEN [facility]  Peri-operative Services Nurse Practitioner Phone: [phone] 12/15/23 11:01 AM

## 2023-12-15 NOTE — Telephone Encounter (Signed)
 Secure message from Dorise Pereyra, NP Sent the following message:   Dorise here from [facility]. Sorry I missed your call. Per PCCM provider, patient may continue her ASA. Thanks for checking... I appreciate the thoroughness.

## 2023-12-15 NOTE — Telephone Encounter (Signed)
 Med Rec and Consent done.    Patient Consent for Virtual Visit        Rebecca Gutierrez has provided verbal consent on 12/15/2023 for a virtual visit (video or telephone).   CONSENT FOR VIRTUAL VISIT FOR:  Rebecca Gutierrez  By participating in this virtual visit I agree to the following:  I hereby voluntarily request, consent and authorize [facility] and its employed or contracted physicians, physician assistants, nurse practitioners or other licensed health care professionals (the Practitioner), to provide me with telemedicine health care services (the "Services) as deemed necessary by the treating Practitioner. I acknowledge and consent to receive the Services by the Practitioner via telemedicine. I understand that the telemedicine visit will involve communicating with the Practitioner through live audiovisual communication technology and the disclosure of certain medical information by electronic transmission. I acknowledge that I have been given the opportunity to request an in-person assessment or other available alternative prior to the telemedicine visit and am voluntarily participating in the telemedicine visit.  I understand that I have the right to withhold or withdraw my consent to the use of telemedicine in the course of my care at any time, without affecting my right to future care or treatment, and that the Practitioner or I may terminate the telemedicine visit at any time. I understand that I have the right to inspect all information obtained and/or recorded in the course of the telemedicine visit and may receive copies of available information for a reasonable fee.  I understand that some of the potential risks of receiving the Services via telemedicine include:  Delay or interruption in medical evaluation due to technological equipment failure or disruption; Information transmitted may not be sufficient (e.g. poor resolution of images) to allow for appropriate medical  decision making by the Practitioner; and/or  In rare instances, security protocols could fail, causing a breach of personal health information.  Furthermore, I acknowledge that it is my responsibility to provide information about my medical history, conditions and care that is complete and accurate to the best of my ability. I acknowledge that Practitioner's advice, recommendations, and/or decision may be based on factors not within their control, such as incomplete or inaccurate data provided by me or distortions of diagnostic images or specimens that may result from electronic transmissions. I understand that the practice of medicine is not an exact science and that Practitioner makes no warranties or guarantees regarding treatment outcomes. I acknowledge that a copy of this consent can be made available to me via my patient portal [facility] MyChart), or I can request a printed copy by calling the office of [facility].    I understand that my insurance will be billed for this visit.   I have read or had this consent read to me. I understand the contents of this consent, which adequately explains the benefits and risks of the Services being provided via telemedicine.  I have been provided ample opportunity to ask questions regarding this consent and the Services and have had my questions answered to my satisfaction. I give my informed consent for the services to be provided through the use of telemedicine in my medical care

## 2023-12-15 NOTE — Telephone Encounter (Signed)
   Pre-operative Risk Assessment    Patient Name: Rebecca Gutierrez  DOB: [birth year] MRN: [id number]   Date of last office visit: 07/30/23 Rebecca Gutierrez Date of next office visit: NONE   Request for Surgical Clearance    Procedure:  BRONCHOSCOPY WITH EBUS  Date of Surgery:  Clearance 12/21/23                                Surgeon:  DR DARRIN BARN Surgeon's Group or Practice Name:  [facility] Phone number:  [phone] Fax number:  [phone]   Type of Clearance Requested:   - Medical  - Pharmacy:  Hold Aspirin    (PER CLEARANCE patient to continue daily LOW DOSE ASPIRIN throughout the perioperative course )   Type of Anesthesia:  General    Additional requests/questions:    Signed, Lucie DELENA Ku   12/15/2023, 11:10 AM        Elnor Dorise BRAVO, NP  P Cv Div Preop Callback Request for pre-operative cardiac clearance:   1. What type of surgery is being performed? BRONCHOSCOPY WITH EBUS  2. When is this surgery scheduled? 12/21/2023   3. Type of clearance being requested (medical, pharmacy, both)? MEDICAL   4. Are there any medications that need to be held prior to surgery? N/A - patient to continue daily LOW DOSE ASPIRIN throughout the perioperative course  5. Practice name and name of physician performing surgery? Performing surgeon: Dr. DARRIN BARN, MD Requesting clearance: Dorise Elnor, FNP-C     6. Anesthesia type (none, local, MAC, general)? GENERAL  7. What is the office phone and fax number?   Fax: [phone]  ATTENTION: Unable to create telephone message as per your standard workflow. Directed by [facility] providers to send requests for cardiac clearance to this pool for appropriate distribution to provider covering pre-operative clearances.  Dorise Elnor, MSN, APRN, FNP-C, CEN [facility] Peri-operative Services Nurse Practitioner Phone: [phone] 12/15/23 11:01 AM

## 2023-12-15 NOTE — Telephone Encounter (Signed)
 S/W pt and scheduled TELE Preop appt 12/16/23. Med Rec and consent done.   Informed pt to wait on Aspirin until instruction are given at the [facility] appt.  Pt stated that she takes her Aspirin at bedtime.    Left a message for [facility] office to confirm about holding Aspirin.

## 2023-12-15 NOTE — Progress Notes (Signed)
 Perioperative / Anesthesia Services  Pre-Admission Testing Clinical Review / Pre-Operative Anesthesia Consult  Date: 12/16/23  PATIENT DEMOGRAPHICS: Name: Rebecca Gutierrez DOB: 02-16-1941 MRN:   [id number]  Note: Available PAT nursing documentation and vital signs have been reviewed. Clinical nursing staff has updated patient's PMH/PSHx, current medication list, and drug allergies/intolerances to ensure complete and comprehensive history available to assist care teams in MDM as it pertains to the aforementioned surgical procedure and anticipated anesthetic course. Extensive review of available clinical information personally performed. Rebecca Gutierrez PMH and PSHx updated with any diagnoses/procedures that  may have been inadvertently omitted during his intake with the pre-admission testing department's nursing staff.  PLANNED SURGICAL PROCEDURE(S):   Case: [id number] Date/Time: 12/21/23 1118   Procedure: BRONCHOSCOPY, WITH EBUS (Bilateral)   Anesthesia type: General   Diagnosis: Shortness of breath [R06.02]   Pre-op diagnosis: sarcoidosis   Location: [facility] PROCEDURE RM 02 / [facility] ORS FOR ANESTHESIA GROUP   Surgeons: Malka Domino, MD        CLINICAL DISCUSSION: Rebecca Gutierrez is a 83 y.o. female who is submitted for pre-surgical anesthesia review and clearance prior to her undergoing the above procedure. Patient has never been a smoker in the past. PMH includes: CAD, frequent PVCs, pulmonary hypertension, MVP, bradycardia, aortic atherosclerosis, HTN, HLD, dyspnea, ILD/sarcoidosis, GERD (on daily H2 blocker), hiatal hernia, hoarseness, systemic lupus, anxiety.  Patient is followed by cardiology Thera, MD). She was last seen in the cardiology clinic on 07/29/2018 follow-up; notes reviewed. At the time of her clinic visit, patient doing well overall from a cardiovascular perspective.  Patient complained of exertional dyspnea.  She has been referred to pulmonary medicine for  further evaluation.  Patient denied any chest pain, shortness of breath, PND, orthopnea, palpitations, significant peripheral edema, weakness, fatigue, vertiginous symptoms, or presyncope/syncope. Patient with a past medical history significant for cardiovascular diagnoses. Documented physical exam was grossly benign, providing no evidence of acute exacerbation and/or decompensation of the patient's known cardiovascular conditions.  Most recent TTE performed on 05/31/2023 revealed a normal left ventricular systolic function with an EF of 55-60%. There was moderate LVH. There were no regional wall motion abnormalities.  Left ventricular diastolic Doppler parameters were normal.  Right ventricular size and function normal with a TAPSE measuring 2.6 cm  (normal range >/= 1.6 cm).  RVSP = 37.9 mmHg. left atrium was mildly dilated. There was moderate holosystolic prolapse of both leaflets of the mitral valve, which were mildly thickened with moderate associated regurgitation.  There was trivial tricuspid valve regurgitation. Aortic valve sclerotic/calcified.  All transvalvular gradients were noted to be normal providing no evidence of hemodynamically significant valvular stenosis. Aorta normal in size with no evidence of ectasia or aneurysmal dilatation.  Blood pressure reasonably controlled at 142/70 mmHg on currently prescribed diuretic (HCTZ) and beta-blocker (metoprolol  tartrate) therapies. Patient is on simvastatin  for her HLD diagnosis and ASCVD prevention. Patient is not diabetic. She does not have an OSAH diagnosis. Patient is able to complete all of her  ADL/IADLs without cardiovascular limitation.  Per the DASI, patient is able to achieve at least 4 METS of physical activity without experiencing any significant degree of angina/anginal equivalent symptoms. No changes were made to her medication regimen during her visit with cardiology.  Patient scheduled to follow-up with outpatient cardiology in 6 months  or sooner if needed.  Rebecca Gutierrez underwent CT imaging of the chest on 04/08/2023 revealing borderline enlarged mediastinal lymphadenopathy with diffuse perilymphatic nodularity in the lungs. Repeat imaging performed  on 10/18/2023 revealed mediastinal adenopathy and upper/mid lung zone predominant perilymphatic thickening and nodularity. Findings consistent with sarcoidosis. In the setting of patient with exertional dyspnea and chronic cough, patient referred to pulmonary medicine for further evaluation and discussions regarding tissue sampling for confirmatory diagnosis.  Patient subsequently scheduled for BRONCHOSCOPY WITH EBUS on 12/21/2023 with Dr. Darrin Barn, MD.  Given patient's past medical history significant for cardiovascular diagnoses, presurgical cardiac clearance was sought by the PAT team. Per cardiology, according to the Revised Cardiac Risk Index (RCRI), her Perioperative Risk of Major Cardiac Event is (%): 0.4. Her Functional Capacity in METs is: 5.07 according to the [employer] Activity Status Index (DASI). Therefore, based on ACC/AHA guidelines, patient would be at ACCEPTABLE risk for the planned procedure without further cardiovascular testing.  In review of the patient's chart, it is noted that she is on daily oral antithrombotic therapy. Given that patient's past medical history is significant for cardiovascular diagnoses, including but not limited to CAD, pulmonary medicine has cleared patient to continue her daily low dose ASA throughout her perioperative course.  Patient has been updated on these directives from her specialty care providers by the PAT team.  Patient denies previous perioperative complications with anesthesia in the past. In review her EMR, it is noted that patient underwent a general anesthetic course here at [facility]) in 10/2021 without documented complications.   MOST RECENT VITAL SIGNS:    12/08/2023     3:58 PM 12/08/2023   11:27 AM 12/06/2023   11:25 AM  Vitals with BMI  Height 5' 1 5' 1 5' 1  Weight 129 lbs 129 lbs 13 oz 130 lbs 6 oz  BMI 24.39 24.54 24.65  Systolic -- 138 140  Diastolic -- 72 70  Pulse  75 81   PROVIDERS/SPECIALISTS: NOTE: Primary physician provider listed below. Patient may have been seen by APP or partner within same practice.   PROVIDER ROLE / SPECIALTY LAST SHERLEAN Barn Darrin, MD Pulmonary Medicine (Surgeon) 12/08/2023  Justus Leita DEL, MD Primary Care Provider 11/17/2023  Darliss Rogue, MD Cardiology 07/30/2023; preop APP call 12/16/2023  Tobie Solo, MD Rheumatology 10/13/2023   ALLERGIES: Allergies  Allergen Reactions   Ciprofloxacin  Other (See Comments)    Tendinitis    Levaquin  [Levofloxacin ] Other (See Comments)    insomnia   Potassium Clavulanate [Clavulanic Acid] Diarrhea   [name] Diarrhea   [name]  Palpitations and Rash    Last reaction was in 1969    CURRENT HOME MEDICATIONS: No current facility-administered medications for this encounter.    aspirin EC 81 MG tablet   Cholecalciferol 50 MCG (2000 UT) TABS   estradiol  (ESTRACE  VAGINAL) 0.1 MG/GM vaginal cream   famotidine  (PEPCID ) 20 MG tablet   fluticasone -salmeterol (ADVAIR HFA) 230-21 MCG/ACT inhaler   hydrochlorothiazide  (HYDRODIURIL ) 25 MG tablet   metoprolol  tartrate (LOPRESSOR ) 25 MG tablet   nitrofurantoin  (MACRODANTIN ) 100 MG capsule   simvastatin  (ZOCOR ) 10 MG tablet   HISTORY: Past Medical History:  Diagnosis Date   Abnormal ECG 10/28/2017   Allergy    Anxiety    Aortic atherosclerosis (HCC)    Bochdalek hernia (bilateral)    Bradycardia 11/10/2017   Chronic cough    Coronary artery disease    Cystocele, midline    Disorder of bilirubin excretion    Dyspnea    Frequent PVCs    GERD (gastroesophageal reflux disease)    Gilbert's syndrome    Hearing loss 1975   Hepatic  steatosis    History of bilateral cataract  extraction    History of hiatal hernia    History of recurrent UTIs    Hoarseness    Hyperlipidemia 2000   Hypertension 2000   ILD (interstitial lung disease) (HCC)    Long-term use of aspirin therapy    MVP (mitral valve prolapse)    Nephrolithiasis    Pulmonary HTN (HCC)    Sarcoidosis    Skin cancer    Systemic lupus (HCC)    Past Surgical History:  Procedure Laterality Date   ANTERIOR AND POSTERIOR REPAIR N/A 05/02/2013   Procedure: ANTERIOR (CYSTOCELE) AND POSTERIOR REPAIR (RECTOCELE);  Surgeon: Norleen LULLA Server, MD;  Location: [facility] ORS;  Service: Gynecology;  Laterality: N/A;   BRAVO PH STUDY N/A 10/21/2021   Procedure: BRAVO PH STUDY;  Surgeon: Jinny Carmine, MD;  Location: [facility] ENDOSCOPY;  Service: Endoscopy;  Laterality: N/A;   CATARACT EXTRACTION, BILATERAL     CHOLECYSTECTOMY  10/15/2020   COLONOSCOPY  08/28/2011   Procedure: COLONOSCOPY;  Surgeon: Claudis RAYMOND Rivet, MD;  Location: [facility] ENDO SUITE;  Service: Endoscopy;  Laterality: N/A;  930   ESOPHAGOGASTRODUODENOSCOPY N/A 10/21/2021   Procedure: ESOPHAGOGASTRODUODENOSCOPY (EGD);  Surgeon: Jinny Carmine, MD;  Location: [facility] ENDOSCOPY;  Service: Endoscopy;  Laterality: N/A;   EYE SURGERY Bilateral 02/13/2011   other 2013   MOHS SURGERY  06/2016   Removed skin cancer from Rt shin   TUBAL LIGATION     VAGINAL HYSTERECTOMY N/A 05/02/2013   Procedure: HYSTERECTOMY VAGINAL;  Surgeon: Norleen LULLA Server, MD;  Location: [facility] ORS;  Service: Gynecology;  Laterality: N/A;   Family History  Problem Relation Age of Onset   Hypertension Mother    Cancer Mother        stomach   Healthy Father    Healthy Brother    Colon cancer Neg Hx    Prostate cancer Neg Hx    Kidney cancer Neg Hx    Bladder Cancer Neg Hx    Breast cancer Neg Hx    Heart disease Neg Hx    Social History   Tobacco Use   Smoking status: Never   Smokeless tobacco: Never  Substance Use Topics   Alcohol use: Not Currently    Alcohol/week: 1.0 standard drink of  alcohol    Types: 1 Standard drinks or equivalent per week   LABS:  Hospital Outpatient Visit on 12/16/2023  Component Date Value Ref Range Status   Sodium 12/16/2023 136  135 - 145 mmol/L Final   Potassium 12/16/2023 3.6  3.5 - 5.1 mmol/L Final   Chloride 12/16/2023 100  98 - 111 mmol/L Final   CO2 12/16/2023 28  22 - 32 mmol/L Final   Glucose, Bld 12/16/2023 119 (H)  70 - 99 mg/dL Final   Glucose reference range applies only to samples taken after fasting for at least 8 hours.   BUN 12/16/2023 18  8 - 23 mg/dL Final   Creatinine, Ser 12/16/2023 0.71  0.44 - 1.00 mg/dL Final   Calcium 92/96/7974 9.7  8.9 - 10.3 mg/dL Final   GFR, Estimated 12/16/2023 >60  >60 mL/min Final   Comment: (NOTE) Calculated using the CKD-EPI Creatinine Equation (2021)    Anion gap 12/16/2023 8  5 - 15 Final   Performed at [facility], [address], [place], [place] [address]  Clinical Support on 12/02/2023  Component Date Value Ref Range Status   FVC-Pre 12/02/2023 2.52  L Final   FVC-%Pred-Pre 12/02/2023  113  % Final   FEV1-Pre 12/02/2023 1.78  L Final   FEV1-%Pred-Pre 12/02/2023 109  % Final   FEV6-Pre 12/02/2023 2.50  L Final   FEV6-%Pred-Pre 12/02/2023 120  % Final   Pre FEV1/FVC ratio 12/02/2023 71  % Final   FEV1FVC-%Pred-Pre 12/02/2023 96  % Final   Pre FEV6/FVC Ratio 12/02/2023 99  % Final   FEV6FVC-%Pred-Pre 12/02/2023 105  % Final   FEF 25-75 Pre 12/02/2023 1.19  L/sec Final   FEF2575-%Pred-Pre 12/02/2023 100  % Final   RV 12/02/2023 1.64  L Final   RV % pred 12/02/2023 72  % Final   TLC 12/02/2023 4.29  L Final   TLC % pred 12/02/2023 93  % Final   DLCO unc 12/02/2023 10.10  ml/min/mmHg Final   DLCO unc % pred 12/02/2023 60  % Final   DL/VA 93/80/7974 7.38  ml/min/mmHg/L Final   DL/VA % pred 93/80/7974 61  % Final  Office Visit on 11/17/2023  Component Date Value Ref Range Status   Color, UA 11/17/2023 yellow   Final   Clarity, UA 11/17/2023 cloudy   Final   Glucose,  UA 11/17/2023 Negative  Negative Final   Bilirubin, UA 11/17/2023 negative   Final   Ketones, UA 11/17/2023 negative   Final   Spec Grav, UA 11/17/2023 1.010  1.010 - 1.025 Final   Blood, UA 11/17/2023 negative   Final   pH, UA 11/17/2023 6.5  5.0 - 8.0 Final   Protein, UA 11/17/2023 Negative  Negative Final   Urobilinogen, UA 11/17/2023 0.2  0.2 or 1.0 E.U./dL Final   Nitrite, UA 93/95/7974 negative   Final   Leukocytes, UA 11/17/2023 Large (3+) (A)  Negative Final    ECG: Date: 07/30/2023  Time ECG obtained: 1413 PM Rate: 88 bpm Rhythm: Sinus rhythm with occasional PVCs Axis (leads I and aVF): normal Intervals: PR 150 ms. QRS 88 ms. QTc 413 ms. ST segment and T wave changes: Nonspecific ST and T wave abnormality Evidence of a possible, age undetermined, prior infarct:  No Comparison: Similar to previous tracing obtained on 02/22/2017   IMAGING / PROCEDURES: PULMONARY FUNCTION TESTING performed on 12/02/2023    Latest Ref Rng & Units 12/02/2023   10:51 AM  PFT Results  FVC-Pre L 2.52   FVC-Predicted Pre % 113   FVC-Post L   FVC-Predicted Post %   Pre FEV1/FVC % % 71   Post FEV1/FCV % %   FEV1-Pre L 1.78   FEV1-Predicted Pre % 109   FEV1-Post L   DLCO uncorrected ml/min/mmHg 10.10   DLCO UNC% % 60   DLVA Predicted % 61   TLC L 4.29   TLC % Predicted % 93   RV % Predicted % 72    CT CHEST HIGH RESOLUTION performed on 10/18/2023 Mediastinal adenopathy and upper/mid lung zone predominant perilymphatic thickening and nodularity, findings indicative of sarcoid, stable from 04/08/2023. Findings are suggestive of an alternative diagnosis (not UIP) per consensus guidelines:  Moderate hiatal hernia. Aortic atherosclerosis Coronary artery calcification.  TRANSTHORACIC ECHOCARDIOGRAM performed on 05/31/2023 Left ventricular ejection fraction, by estimation, is 55 to 60%. The left ventricle has normal function. The left ventricle has no regional wall motion abnormalities.  There is moderate left ventricular hypertrophy. Left ventricular diastolic parameters were normal.  Right ventricular systolic function is normal. The right ventricular size is normal. There is mildly elevated pulmonary artery systolic pressure. The estimated right ventricular systolic pressure is 37.9 mmHg.  Left atrial size  was mildly dilated.  The mitral valve is degenerative. Moderate mitral valve regurgitation. No evidence of mitral stenosis. There is moderate holosystolic prolapse of both leaflets of the mitral valve.  The aortic valve is calcified. Aortic valve regurgitation is not visualized. Aortic valve sclerosis/calcification is present, without any evidence of aortic stenosis.   IMPRESSION AND PLAN: Jaasia Viglione Watterson has been referred for pre-anesthesia review and clearance prior to her undergoing the planned anesthetic and procedural courses. Available labs, pertinent testing, and imaging results were personally reviewed by me in preparation for upcoming operative/procedural course. [facility] medical record has been updated following extensive record review and patient interview with PAT staff.   This patient has been appropriately cleared by cardiology with an overall ACCEPTABLE  risk of patient experiencing significant perioperative cardiovascular complications. Based on clinical review performed today (12/16/23), barring any significant acute changes in the patient's overall condition, it is anticipated that she will be able to proceed with the planned surgical intervention. Any acute changes in clinical condition may necessitate her procedure being postponed and/or cancelled. Patient will meet with anesthesia team (MD and/or CRNA) on the day of her procedure for preoperative evaluation/assessment. Questions regarding anesthetic course will be fielded at that time.   Pre-surgical instructions were reviewed with the patient during his PAT appointment, and questions were fielded to  satisfaction by PAT clinical staff. She has been instructed on which medications that she will need to hold prior to surgery, as well as the ones that have been deemed safe/appropriate to take on the day of her procedure. As part of the general education provided by PAT, patient made aware both verbally and in writing, that she would need to abstain from the use of any illegal substances during her perioperative course. She was advised that failure to follow the provided instructions could necessitate case cancellation or result in serious perioperative complications up to and including death. Patient encouraged to contact PAT and/or her surgeon's office to discuss any questions or concerns that may arise prior to surgery; verbalized understanding.   Dorise Pereyra, MSN, APRN, FNP-C, CEN [facility]  Perioperative Services Nurse Practitioner Phone: [phone] Fax: [phone] 12/16/23 3:51 PM  NOTE: This note has been prepared using Dragon dictation software. Despite my best ability to proofread, there is always the potential that unintentional transcriptional errors may still occur from this process.

## 2023-12-15 NOTE — Telephone Encounter (Signed)
   Name: Rebecca Gutierrez  DOB: December 30, 1940  MRN: [id number]  Primary Cardiologist: Darliss    Preoperative team, please contact this patient and set up a phone call appointment for further preoperative risk assessment. Please obtain consent and complete medication review. Thank you for your help.  I confirm that guidance regarding antiplatelet and oral anticoagulation therapy has been completed and, if necessary, noted below.  Per office protocol, if patient is without any new symptoms or concerns at the time of their virtual visit, she may hold ASA for 7 days prior to procedure. Please resume ASA as soon as possible postprocedure, at the discretion of the surgeon.    I also confirmed the patient resides in the state of [place] . As per [facility] Medical Board telemedicine laws, the patient must reside in the state in which the provider is licensed.   Lamarr Satterfield, NP 12/15/2023, 11:20 AM [facility]

## 2023-12-16 ENCOUNTER — Encounter
Admission: RE | Admit: 2023-12-16 | Discharge: 2023-12-16 | Disposition: A | Discharge status: Home / Self Care | Source: Ambulatory Visit | Attending: Pulmonary Disease | Admitting: Pulmonary Disease

## 2023-12-16 ENCOUNTER — Ambulatory Visit: Attending: Cardiology | Admitting: Emergency Medicine

## 2023-12-16 DIAGNOSIS — Z79899 Other long term (current) drug therapy: Secondary | ICD-10-CM | POA: Diagnosis not present

## 2023-12-16 DIAGNOSIS — Z01810 Encounter for preprocedural cardiovascular examination: Secondary | ICD-10-CM | POA: Diagnosis not present

## 2023-12-16 DIAGNOSIS — Z01812 Encounter for preprocedural laboratory examination: Secondary | ICD-10-CM | POA: Insufficient documentation

## 2023-12-16 DIAGNOSIS — I10 Essential (primary) hypertension: Secondary | ICD-10-CM | POA: Diagnosis not present

## 2023-12-16 LAB — BASIC METABOLIC PANEL WITH GFR
Anion gap: 8 (ref 5–15)
BUN: 18 mg/dL (ref 8–23)
CO2: 28 mmol/L (ref 22–32)
Calcium: 9.7 mg/dL (ref 8.9–10.3)
Chloride: 100 mmol/L (ref 98–111)
Creatinine, Ser: 0.71 mg/dL (ref 0.44–1.00)
GFR, Estimated: >60 mL/min (ref >60)
Glucose, Bld: 119 mg/dL — ABNORMAL HIGH (ref 70–99)
Potassium: 3.6 mmol/L (ref 3.5–5.1)
Sodium: 136 mmol/L (ref 135–145)

## 2023-12-16 NOTE — Progress Notes (Signed)
 Rheumatology Follow Up Note  Chief Complaint  Patient presents with  . Systemic lupus erythematosus (SLE) in adult      Subjective:HPI   Rebecca Gutierrez is a 83 y.o. female is here today for follow up of lupus. The patient's allergies, current medications, past family history, past medical history, past social history, past surgical history and problem list were reviewed and updated as appropriate.   She off CellCept again due to affecting her taste. She does have cough. She has no joint pain or swelling. She has noticed mild swelling. She has noticed left TMJ discomfort. She has no trouble breathing. She is able to do her activities without getting short of breath.   Patient denies fever, night sweats, lymphadenopathy, alopecia, dry eyes/mouth, iritis/uveitis/scleritis, parotid swelling, malar rash/photosensitive rash/other skin rash, nasal/oral ulcers, hearing loss, hemoptysis/epistaxis/recurrent sinus infection, difficulty swallowing, pleurisy, serositis, hematochezia/hematuria, seizures/stroke/DVT/PE/Raynaud's/Paresthesia.   Review of Systems:   Review of Systems  Constitutional:  Negative for fatigue.  HENT:  Positive for hearing loss. Negative for mouth sores and trouble swallowing.        Neg: Dry Mouth  Eyes:  Negative for redness.       Neg: Dry Eyes  Respiratory:  Negative for cough and shortness of breath.   Cardiovascular:  Negative for chest pain and leg swelling.  Gastrointestinal:  Negative for constipation, diarrhea and nausea.  Endocrine: Negative for cold intolerance and heat intolerance.  Genitourinary:  Negative for hematuria.  Musculoskeletal:        Per HPI  Skin:  Negative for color change and rash.  Neurological:  Negative for dizziness, weakness, numbness and headaches.  Hematological:  Does not bruise/bleed easily.  Psychiatric/Behavioral:  Negative for dysphoric mood and sleep disturbance. The patient is not nervous/anxious.   All other systems reviewed  and are negative.  Objective:  Vitals:   12/16/23 1107  BP: (!) 160/80  Temp: 36.1 C (96.9 F)  TempSrc: Temporal  Weight: 59.4 kg (130 lb 15.3 oz)  Height: 154.9 cm (5' 1)  PainSc: 0-No pain     Length of Stiffness: None   GEN - Pleasant, No Apparent Distress  HEENT - normocephalic and atraumatic. Conjunctiva Clear.  No Nasal/Oral Ulcer. Adequate Saliva Neck - supple with no adenopathy or thyromegaly.   C spine with full range of motion. Heart - regular rate and rhythm, No murmurs/gallops/rub, Nml S1S2 Lungs - clear to auscultation in all fields. Mild decrease air entry at base  Extremities - there is no cyanosis or edema. Neurological - alert and oriented.  Spine - no paraspinal tenderness; no lumbar spine tenderness  Skin - no rashes observed; lower extremity skin thickening  MSK - The following joints were examined bilaterally: Hands, Wrists, Elbows, Shoulders, Metatarsals, Ankels, Knees and Hips; they were normal apart from what is noted.    100% Fist Formation Mild DIP Enlargement  No Synovitis or Dactylitis Strength 5/5 Proximal and Distal, Upper and Lower Extremity No Tender Point ______________________________________________________________________ Labs/Imaging Reviewed in EMR Cr 0.80, Ast 17, ALT 20 CBC Hgb 13.7, Hct 41.8, Plt 246 HgA1C 5.8 TSH 2.74 CK 52 UA: No Protein or RBC ESR 7 Normal C3 and C4 Pos: ANA (>1:1280), Anti-Sm (76), Anti-U1 RNP (76), Anti-Scl-70 (48) Neg: Anti-Centromere, And-dsDNA, Anti SSA, AntiSSB, Anti-Cardiolipin (IgG, IgA, IgM), Anti-Chromatin, Anti-CCP, RF, Anti-TPO, Hypersensitivity Pneumonitis Profile   PFTs with normal spirometry, normal lung volumes and moderate to severely reduced DLCO. Suggestive of PVD. PEFR was increased consistent with possible ILD.    CT Chest  HR (03/2023):  1. Small to borderline enlarged mediastinal lymph nodes with diffuse perilymphatic nodularity in the lungs, findings indicative of sarcoid.  2. Tiny  bilateral renal stones.  3. Aortic atherosclerosis (ICD10-I70.0). Coronary artery calcification.    Assessment and Plan     Lupus/MCTD -- Positive ANA, Smith, RNP, Scl-70, Lung disease with cough and dyspnea, lymph node enlargement, lower extremity skin thickening   -- Pulmonologist: Dr.Assaker  -- Off the Prednisone   -- Continue Pepcid  -- Was on CellCept 1000 mg twice daily (Could be affecting her taste) -- Will monitor off medications  -- Reviewed Labs   Diagnoses and all orders for this visit:  Systemic lupus erythematosus (SLE) in adult (CMS/HHS-HCC)  ILD (interstitial lung disease) (CMS/HHS-HCC)    Return in about 3 months (around 03/17/2024) for Routine Follow Up.   All new prescription medications, changes in current prescription dosages, and sample medications were discussed with the patient, including patient education, medication name, use, dosage, potential side effects, drug interactions, consequences of not using/taking, and special instructions.  Patient expressed understanding.  No barriers to adherence.   I appreciate the opportunity to participate in the care of Rebecca Gutierrez. Please do not hesitate to contact me with any questions or concerns that may arise in regards to the patient's rheumatologic disease.    Attestation Statement:   I personally performed the service. (TP)  MAYUR LOREE BLANCH, MD

## 2023-12-16 NOTE — Progress Notes (Signed)
 Virtual Visit via Telephone Note   Because of Rebecca Gutierrez co-morbid illnesses, she is at least at moderate risk for complications without adequate follow up.  This format is felt to be most appropriate for this patient at this time.  Due to technical limitations with video connection (technology), today's appointment will be conducted as an audio only telehealth visit, and Rebecca Gutierrez verbally agreed to proceed in this manner.   All issues noted in this document were discussed and addressed.  No physical exam could be performed with this format.  Evaluation Performed:  Preoperative cardiovascular risk assessment _____________   Date:  12/16/2023   Patient ID:  Rebecca Gutierrez, DOB 1940/08/26, MRN [id number] Patient Location:  Home Provider location:   Office  Primary Care Provider:  Justus Leita DEL, MD Primary Cardiologist:  Redell Cave, MD  Chief Complaint / Patient Profile   83 y.o. y/o female with a h/o coronary artery disease, hypertension, hyperlipidemia who is pending bronchoscopy with EBUS on 12/21/2023 with [facility] and presents today for telephonic preoperative cardiovascular risk assessment.  History of Present Illness    Rebecca Gutierrez is a 83 y.o. female who presents via Web designer for a telehealth visit today.  Pt was last seen in cardiology clinic on 07/30/2023 by Dr. Cave.  At that time Rebecca Gutierrez was doing well.  The patient is now pending procedure as outlined above. Since her last visit, she is doing well without acute cardiovascular concerns or complaints.  She denies any chest pain or any exertional symptoms.  She does continue to walk and denies any chest pains.  She does have some baseline shortness of breath that is stable with no recent exacerbations.  She also endorses cough as well.  She is being worked up for sarcoidosis with bronchoscopy.  She denies any leg swelling, orthopnea, PND,  palpitations, lightheadedness, dizziness.  Overall she is able to complete greater than 4 METS, no indication for further ischemic evaluation at this time.  Past Medical History    Past Medical History:  Diagnosis Date   Abnormal ECG 10/28/2017   Allergy    Anxiety    Aortic atherosclerosis (HCC)    Bochdalek hernia (bilateral)    Bradycardia 11/10/2017   Chronic cough    Coronary artery disease    Cystocele, midline    Disorder of bilirubin excretion    Dyspnea    Frequent PVCs    GERD (gastroesophageal reflux disease)    Gilbert's syndrome    Hearing loss 1975   Hepatic steatosis    History of bilateral cataract extraction    History of hiatal hernia    History of recurrent UTIs    Hoarseness    Hyperlipidemia 2000   Hypertension 2000   ILD (interstitial lung disease) (HCC)    Long-term use of aspirin therapy    MVP (mitral valve prolapse)    Nephrolithiasis    Pulmonary HTN (HCC)    Sarcoidosis    Skin cancer    Systemic lupus (HCC)    Past Surgical History:  Procedure Laterality Date   ANTERIOR AND POSTERIOR REPAIR N/A 05/02/2013   Procedure: ANTERIOR (CYSTOCELE) AND POSTERIOR REPAIR (RECTOCELE);  Surgeon: Norleen LULLA Server, MD;  Location: [facility];  Service: Gynecology;  Laterality: N/A;   BRAVO PH STUDY N/A 10/21/2021   Procedure: BRAVO PH STUDY;  Surgeon: Jinny Carmine, MD;  Location: [facility] ENDOSCOPY;  Service: Endoscopy;  Laterality: N/A;   CATARACT EXTRACTION, BILATERAL  CHOLECYSTECTOMY  10/15/2020   COLONOSCOPY  08/28/2011   Procedure: COLONOSCOPY;  Surgeon: Claudis RAYMOND Rivet, MD;  Location: [facility] SUITE;  Service: Endoscopy;  Laterality: N/A;  930   ESOPHAGOGASTRODUODENOSCOPY N/A 10/21/2021   Procedure: ESOPHAGOGASTRODUODENOSCOPY (EGD);  Surgeon: Jinny Carmine, MD;  Location: [facility] END[facility]COPY;  Service: Endoscopy;  Laterality: N/A;   EYE SURGERY Bilateral 02/13/2011   other 2013   MOHS SURGERY  06/2016   Removed skin cancer from Rt shin   TUBAL LIGATION      VAGINAL HYSTERECTOMY N/A 05/02/2013   Procedure: HYSTERECTOMY VAGINAL;  Surgeon: Norleen LULLA Server, MD;  Location: [facility];  Service: Gynecology;  Laterality: N/A;    Allergies  Allergies  Allergen Reactions   Ciprofloxacin  Other (See Comments)    Tendinitis    Levaquin  [Levofloxacin ] Other (See Comments)    insomnia   Potassium Clavulanate [Clavulanic Acid] Diarrhea   [name] Diarrhea   [name]  Palpitations and Rash    Last reaction was in 1969    Home Medications    Prior to Admission medications   Medication Sig Start Date End Date Taking? Authorizing Provider  aspirin EC 81 MG tablet Take 81 mg by mouth at bedtime.     [provider]  Cholecalciferol 50 MCG (2000 UT) TABS Take 1 capsule by mouth in the morning.    [provider]  estradiol  (ESTRACE  VAGINAL) 0.1 MG/GM vaginal cream Apply 0.5mg  (pea-sized amount)  just inside the vaginal introitus with a finger-tip on Monday, Wednesday and Friday nights. 04/30/21   Justus Leita DEL, MD  famotidine  (PEPCID ) 20 MG tablet TAKE 1 TABLET BY MOUTH TWICE  DAILY Patient taking differently: Take 20 mg by mouth every evening. 04/27/23   [place], PA-C  fluticasone -salmeterol (ADVAIR HFA) 230-21 MCG/ACT inhaler Inhale 2 puffs into the lungs 2 (two) times daily. 12/08/23   Assaker, Darrin, MD  hydrochlorothiazide  (HYDRODIURIL ) 25 MG tablet TAKE 1 TABLET BY MOUTH DAILY Patient taking differently: Take 25 mg by mouth every morning. 03/07/23   Berglund, Laura H, MD  metoprolol  tartrate (LOPRESSOR ) 25 MG tablet TAKE 1 AND 1/2 TABLETS BY MOUTH  TWICE DAILY 10/28/23   Justus Leita DEL, MD  nitrofurantoin  (MACRODANTIN ) 100 MG capsule Take 1 capsule (100 mg total) by mouth at bedtime. 05/10/23   Gaston Hamilton, MD  simvastatin  (ZOCOR ) 10 MG tablet Take 1 tablet (10 mg total) by mouth daily. Patient taking differently: Take 10 mg by mouth at bedtime. 11/01/23   Justus Leita DEL, MD     Physical Exam    Vital Signs:  Rebecca Gutierrez does not have vital signs available for review today.  Given telephonic nature of communication, physical exam is limited. AAOx3. NAD. Normal affect.  Speech and respirations are unlabored.  Accessory Clinical Findings    None  Assessment & Plan    1.  Preoperative Cardiovascular Risk Assessment: According to the Revised Cardiac Risk Index (RCRI), her Perioperative Risk of Major Cardiac Event is (%): 0.4. Her Functional Capacity in METs is: 5.07 according to the Duke Activity Status Index (DASI). Therefore, based on ACC/AHA guidelines, patient would be at acceptable risk for the planned procedure without further cardiovascular testing. I will route this recommendation to the requesting party via Epic fax function.  The patient was advised that if she develops new symptoms prior to surgery to contact our office to arrange for a follow-up visit, and she verbalized understanding.  PER CLEARANCE request: Patient to continue daily LOW DOSE ASPIRIN throughout the  perioperative course  A copy of this note will be routed to requesting surgeon.  Time:   Today, I have spent 10 minutes with the patient with telehealth technology discussing medical history, symptoms, and management plan.     Lum LITTIE Louis, NP  12/16/2023, 3:41 PM

## 2023-12-21 ENCOUNTER — Ambulatory Visit: Payer: Self-pay | Admitting: Urgent Care

## 2023-12-21 ENCOUNTER — Encounter: Payer: Self-pay | Admitting: Pulmonary Disease

## 2023-12-21 ENCOUNTER — Other Ambulatory Visit: Payer: Self-pay

## 2023-12-21 ENCOUNTER — Encounter
Admission: RE | Disposition: A | Discharge status: Home / Self Care | Payer: Self-pay | Source: Home / Self Care | Attending: Pulmonary Disease

## 2023-12-21 ENCOUNTER — Ambulatory Visit

## 2023-12-21 ENCOUNTER — Ambulatory Visit
Admission: RE | Admit: 2023-12-21 | Discharge: 2023-12-21 | Disposition: A | Discharge status: Home / Self Care | Attending: Pulmonary Disease | Admitting: Pulmonary Disease

## 2023-12-21 DIAGNOSIS — I119 Hypertensive heart disease without heart failure: Secondary | ICD-10-CM | POA: Diagnosis not present

## 2023-12-21 DIAGNOSIS — I700 Atherosclerosis of aorta: Secondary | ICD-10-CM | POA: Insufficient documentation

## 2023-12-21 DIAGNOSIS — I272 Pulmonary hypertension, unspecified: Secondary | ICD-10-CM | POA: Insufficient documentation

## 2023-12-21 DIAGNOSIS — Z79899 Other long term (current) drug therapy: Secondary | ICD-10-CM | POA: Diagnosis not present

## 2023-12-21 DIAGNOSIS — K219 Gastro-esophageal reflux disease without esophagitis: Secondary | ICD-10-CM | POA: Diagnosis not present

## 2023-12-21 DIAGNOSIS — I080 Rheumatic disorders of both mitral and aortic valves: Secondary | ICD-10-CM | POA: Insufficient documentation

## 2023-12-21 DIAGNOSIS — R0602 Shortness of breath: Secondary | ICD-10-CM | POA: Diagnosis present

## 2023-12-21 DIAGNOSIS — N289 Disorder of kidney and ureter, unspecified: Secondary | ICD-10-CM | POA: Diagnosis not present

## 2023-12-21 DIAGNOSIS — J849 Interstitial pulmonary disease, unspecified: Secondary | ICD-10-CM | POA: Insufficient documentation

## 2023-12-21 DIAGNOSIS — I341 Nonrheumatic mitral (valve) prolapse: Secondary | ICD-10-CM | POA: Insufficient documentation

## 2023-12-21 DIAGNOSIS — E785 Hyperlipidemia, unspecified: Secondary | ICD-10-CM | POA: Insufficient documentation

## 2023-12-21 DIAGNOSIS — R011 Cardiac murmur, unspecified: Secondary | ICD-10-CM | POA: Diagnosis not present

## 2023-12-21 DIAGNOSIS — D869 Sarcoidosis, unspecified: Secondary | ICD-10-CM | POA: Diagnosis not present

## 2023-12-21 DIAGNOSIS — K449 Diaphragmatic hernia without obstruction or gangrene: Secondary | ICD-10-CM | POA: Diagnosis not present

## 2023-12-21 DIAGNOSIS — I2510 Atherosclerotic heart disease of native coronary artery without angina pectoris: Secondary | ICD-10-CM | POA: Diagnosis not present

## 2023-12-21 DIAGNOSIS — M329 Systemic lupus erythematosus, unspecified: Secondary | ICD-10-CM | POA: Insufficient documentation

## 2023-12-21 HISTORY — DX: Personal history of urinary (tract) infections: Z87.440

## 2023-12-21 HISTORY — DX: Cataract extraction status, right eye: Z98.41

## 2023-12-21 HISTORY — DX: Long term (current) use of aspirin: Z79.82

## 2023-12-21 HISTORY — DX: Unspecified malignant neoplasm of skin, unspecified: C44.90

## 2023-12-21 HISTORY — DX: Pulmonary hypertension, unspecified: I27.20

## 2023-12-21 HISTORY — DX: Calculus of kidney: N20.0

## 2023-12-21 HISTORY — DX: Nonrheumatic mitral (valve) prolapse: I34.1

## 2023-12-21 HISTORY — DX: Congenital diaphragmatic hernia: Q79.0

## 2023-12-21 HISTORY — PX: VIDEO BRONCHOSCOPY WITH ENDOBRONCHIAL ULTRASOUND: SHX6177

## 2023-12-21 LAB — BODY FLUID CELL COUNT WITH DIFFERENTIAL
Eos, Fluid: 15 %
Lymphs, Fluid: 55 %
Monocyte-Macrophage-Serous Fluid: 24 % — ABNORMAL LOW (ref 50–90)
Neutrophil Count, Fluid: 6 % (ref 0–25)
Total Nucleated Cell Count, Fluid: 150 uL (ref 0–1000)

## 2023-12-21 SURGERY — BRONCHOSCOPY, WITH EBUS
Anesthesia: General | Laterality: Bilateral

## 2023-12-21 MED ORDER — CHLORHEXIDINE GLUCONATE 0.12 % MT SOLN: 15.0000 mL | Freq: Once | OROMUCOSAL | Status: DC

## 2023-12-21 MED ORDER — LACTATED RINGERS IV SOLN: INTRAVENOUS | Status: DC

## 2023-12-21 MED ORDER — FENTANYL CITRATE (PF) 100 MCG/2ML IJ SOLN
INTRAMUSCULAR | Status: DC | PRN
  Administered 2023-12-21: 50 ug via INTRAVENOUS

## 2023-12-21 MED ORDER — FENTANYL CITRATE (PF) 100 MCG/2ML IJ SOLN
INTRAMUSCULAR | Status: AC
  Filled 2023-12-21: qty 2

## 2023-12-21 MED ORDER — ROCURONIUM BROMIDE 100 MG/10ML IV SOLN
INTRAVENOUS | Status: DC | PRN
  Administered 2023-12-21: 30 mg via INTRAVENOUS

## 2023-12-21 MED ORDER — ACETAMINOPHEN 10 MG/ML IV SOLN: 1000.0000 mg | Freq: Once | INTRAVENOUS | Status: DC | PRN

## 2023-12-21 MED ORDER — PROPOFOL 1000 MG/100ML IV EMUL
INTRAVENOUS | Status: AC
  Filled 2023-12-21: qty 100

## 2023-12-21 MED ORDER — FENTANYL CITRATE (PF) 100 MCG/2ML IJ SOLN: 25.0000 ug | INTRAMUSCULAR | Status: DC | PRN

## 2023-12-21 MED ORDER — PROPOFOL 10 MG/ML IV BOLUS
INTRAVENOUS | Status: DC | PRN
  Administered 2023-12-21: 70 mg via INTRAVENOUS
  Administered 2023-12-21: 100 ug/kg/min via INTRAVENOUS

## 2023-12-21 MED ORDER — PROPOFOL 10 MG/ML IV BOLUS
INTRAVENOUS | Status: AC
  Filled 2023-12-21: qty 20

## 2023-12-21 MED ORDER — IPRATROPIUM-ALBUTEROL 0.5-2.5 (3) MG/3ML IN SOLN
3.0000 mL | Freq: Once | RESPIRATORY_TRACT | Status: AC
  Administered 2023-12-21: 3 mL via RESPIRATORY_TRACT

## 2023-12-21 MED ORDER — OXYCODONE HCL 5 MG PO TABS: 5.0000 mg | ORAL_TABLET | Freq: Once | ORAL | Status: DC | PRN

## 2023-12-21 MED ORDER — OXYCODONE HCL 5 MG/5ML PO SOLN: 5.0000 mg | Freq: Once | ORAL | Status: DC | PRN

## 2023-12-21 MED ORDER — ONDANSETRON HCL 4 MG/2ML IJ SOLN: 4.0000 mg | Freq: Once | INTRAMUSCULAR | Status: DC | PRN

## 2023-12-21 MED ORDER — ONDANSETRON HCL 4 MG/2ML IJ SOLN
INTRAMUSCULAR | Status: DC | PRN
  Administered 2023-12-21: 4 mg via INTRAVENOUS

## 2023-12-21 MED ORDER — LIDOCAINE HCL (CARDIAC) PF 100 MG/5ML IV SOSY
PREFILLED_SYRINGE | INTRAVENOUS | Status: DC | PRN
  Administered 2023-12-21: 60 mg via INTRAVENOUS

## 2023-12-21 MED ORDER — IPRATROPIUM-ALBUTEROL 0.5-2.5 (3) MG/3ML IN SOLN
RESPIRATORY_TRACT | Status: AC
Start: 2023-12-21 — End: 2023-12-21
  Filled 2023-12-21: qty 3

## 2023-12-21 MED ORDER — SUGAMMADEX SODIUM 200 MG/2ML IV SOLN
INTRAVENOUS | Status: DC | PRN
  Administered 2023-12-21: 150 mg via INTRAVENOUS

## 2023-12-21 MED ORDER — DEXAMETHASONE SODIUM PHOSPHATE 10 MG/ML IJ SOLN
INTRAMUSCULAR | Status: DC | PRN
  Administered 2023-12-21: 5 mg via INTRAVENOUS

## 2023-12-21 MED ORDER — ORAL CARE MOUTH RINSE: 15.0000 mL | Freq: Once | OROMUCOSAL | Status: DC

## 2023-12-21 NOTE — Progress Notes (Signed)
 Patient ambulated to bathroom, voided. In recliner, using IS, nonproductive cough. Sats on RA 91-93%. Dr. Malka made aware.

## 2023-12-21 NOTE — Transfer of Care (Signed)
 Immediate Anesthesia Transfer of Care Note  Patient: Deane Melick Barua  Procedure(s) Performed: BRONCHOSCOPY, WITH EBUS (Bilateral)  Patient Location: PACU  Anesthesia Type:General  Level of Consciousness: awake, alert , and oriented  Airway & Oxygen Therapy: Patient Spontanous Breathing and Patient connected to face mask oxygen  Post-op Assessment: Report given to RN and Post -op Vital signs reviewed and stable  Post vital signs: stable  Last Vitals:  Vitals Value Taken Time  BP 164/84 12/21/23 12:23  Temp    Pulse 85 12/21/23 12:26  Resp 23 12/21/23 12:26  SpO2 95 % 12/21/23 12:26  Vitals shown include unfiled device data.  Last Pain:  Vitals:   12/21/23 0935  TempSrc: Oral         Complications: No notable events documented.

## 2023-12-21 NOTE — Op Note (Signed)
 Flexible and EBUS Bronchoscopy Procedure Note  Rebecca Gutierrez  [id number]  01/21/1941  Date:12/21/23  Time:12:20 PM   Provider Performing:Jean-Pierre Keaisha Sublette   Procedure: Flexible bronchoscopy and EBUS Bronchoscopy  Indication(s) Sarcoidosis  Consent Risks of the procedure as well as the alternatives and risks of each were explained to the patient and/or caregiver.  Consent for the procedure was obtained.  Anesthesia General Anesthesia  Time Out Verified patient identification, verified procedure, site/side was marked, verified correct patient position, special equipment/implants available, medications/allergies/relevant history reviewed, required imaging and test results available.  Sterile Technique Usual hand hygiene, masks, gowns, and gloves were used  Procedure Description Diagnostic bronchoscope advanced through endotracheal tube and into airway.  Airways were examined down to subsegmental level with findings noted below.    MC normal, RM, RUL, BI, RML  RLL all normal without suspicious lesions. LM, LUL and LLL are normal without suspicious lesions. RML was wedged and 100cc of NS were instilled with 40 ccs returned and sent for analysis.    The diagnostic bronchoscope was then removed and the EBUS bronchoscope was advanced into airway with stations 7 and 4R  biopsied and sent for cytology. The EBUS bronchoscope was removed after assuring no active bleeding from biopsy site.  Complications/Tolerance None; patient tolerated the procedure well. Chest X-ray is not needed post procedure.  EBL Minimal  Specimen(s) -- FNA LN Station 7 -- FNA LN station 4R -- BAL RML sent for AFB smear and culture, gram stain and culture, fungal culture, cell count and differential, CD4 and CD 8 count.  -- Endobronchial forceps biopsies obtained from LC-1 and sent for pathology.   Darrin Barn, MD [facility] 12/21/2023 12:25 PM

## 2023-12-21 NOTE — Anesthesia Preprocedure Evaluation (Addendum)
 Anesthesia Evaluation  Patient identified by MRN, date of birth, ID band Patient awake    Reviewed: Allergy & Precautions, NPO status , Patient's Chart, lab work & pertinent test results  History of Anesthesia Complications Negative for: history of anesthetic complications  Airway Mallampati: IV   Neck ROM: Full    Dental  (+) Upper Dentures, Lower Dentures   Pulmonary neg pulmonary ROS   Pulmonary exam normal breath sounds clear to auscultation       Cardiovascular hypertension, pulmonary hypertension+ CAD  Normal cardiovascular exam+ Valvular Problems/Murmurs MVP  Rhythm:Regular Rate:Normal  ECG 07/30/23: Sinus rhythm with occasional Premature ventricular complexes Nonspecific ST and T wave abnormality  Echo 05/31/23:  1. Left ventricular ejection fraction, by estimation, is 55 to 60%. The left ventricle has normal function. The left ventricle has no regional wall motion abnormalities. There is moderate left ventricular hypertrophy. Left ventricular diastolic parameters were normal.  2. Right ventricular systolic function is normal. The right ventricular size is normal. There is mildly elevated pulmonary artery systolic pressure. The estimated right ventricular systolic pressure is 37.9 mmHg.  3. Left atrial size was mildly dilated.  4. The mitral valve is degenerative. Moderate mitral valve regurgitation. No evidence of mitral stenosis. There is moderate holosystolic prolapse of both leaflets of the mitral valve.  5. The aortic valve is calcified. Aortic valve regurgitation is not visualized. Aortic valve sclerosis/calcification is present, without any evidence of aortic stenosis.    Neuro/Psych  PSYCHIATRIC DISORDERS Anxiety     HOH    GI/Hepatic ,GERD  ,,Bertrum syndrome   Endo/Other  negative endocrine ROS    Renal/GU Renal disease (nephrolithiasis)     Musculoskeletal SLE   Abdominal   Peds   Hematology negative hematology ROS (+)   Anesthesia Other Findings Reviewed and agree with Dorise Boor pre-anesthesia clinical review note.    Cardiology note 07/30/23:  1. CAD(LAD, RCA calcifications on chest CT).  Denies chest pain.  Shortness of breath improved with using inhalers indicating noncardiac origin.  Continue aspirin, simvastatin .  Echo showed normal EF 55 to 60%.  Consider stress testing if angina symptoms occur. 2. Hypertension, BP elevated today, usually controlled.  Continue HCTZ 25 mg daily, Lopressor  37.5 mg twice daily. 3. Hyperlipidemia, simvastatin  10 mg daily.   Follow-up in 6 months.   Reproductive/Obstetrics                              Anesthesia Physical Anesthesia Plan  ASA: 3  Anesthesia Plan: General   Post-op Pain Management:    Induction: Intravenous  PONV Risk Score and Plan: 3 and Ondansetron , Dexamethasone  and Treatment may vary due to age or medical condition  Airway Management Planned: Oral ETT  Additional Equipment:   Intra-op Plan:   Post-operative Plan: Extubation in OR  Informed Consent: I have reviewed the patients History and Physical, chart, labs and discussed the procedure including the risks, benefits and alternatives for the proposed anesthesia with the patient or authorized representative who has indicated his/her understanding and acceptance.     Dental advisory given  Plan Discussed with: CRNA  Anesthesia Plan Comments: (Patient consented for risks of anesthesia including but not limited to:  - adverse reactions to medications - damage to eyes, teeth, lips or other oral mucosa - nerve damage due to positioning  - sore throat or hoarseness - damage to heart, brain, nerves, lungs, other parts of body or loss of life  Informed patient about role of CRNA in peri- and intra-operative care.  Patient voiced understanding.)         Anesthesia Quick Evaluation

## 2023-12-21 NOTE — Anesthesia Postprocedure Evaluation (Signed)
 Anesthesia Post Note  Patient: Rebecca Gutierrez  Procedure(s) Performed: BRONCHOSCOPY, WITH EBUS (Bilateral)  Patient location during evaluation: PACU Anesthesia Type: General Level of consciousness: awake and alert, oriented and patient cooperative Pain management: pain level controlled Vital Signs Assessment: post-procedure vital signs reviewed and stable Respiratory status: spontaneous breathing, nonlabored ventilation and respiratory function stable Cardiovascular status: blood pressure returned to baseline and stable Postop Assessment: adequate PO intake Anesthetic complications: no   No notable events documented.   Last Vitals:  Vitals:   12/21/23 1230 12/21/23 1245  BP: (!) 117/92 130/60  Pulse: 86   Resp: 18 14  Temp:    SpO2: 95%     Last Pain:  Vitals:   12/21/23 1245  TempSrc:   PainSc: 0-No pain                 Alfonso Ruths

## 2023-12-21 NOTE — Interval H&P Note (Signed)
 History and Physical Interval Note:  12/21/2023 11:28 AM  Rebecca Gutierrez  has presented today for surgery, with the diagnosis of sarcoidosis.  The various methods of treatment have been discussed with the patient and family. After consideration of risks, benefits and other options for treatment, the patient has consented to  Procedure(s): BRONCHOSCOPY, WITH EBUS (Bilateral) as a surgical intervention.  The patient's history has been reviewed, patient examined, no change in status, stable for surgery.  I have reviewed the patient's chart and labs.  Questions were answered to the patient's satisfaction.     Jean-Pierre Sueko Dimichele

## 2023-12-21 NOTE — Anesthesia Procedure Notes (Signed)
 Procedure Name: Intubation Date/Time: 12/21/2023 11:31 AM  Performed by: Gillermo Spruce I, CRNAPre-anesthesia Checklist: Patient identified, Emergency Drugs available, Suction available and Patient being monitored Patient Re-evaluated:Patient Re-evaluated prior to induction Oxygen Delivery Method: Circle system utilized Preoxygenation: Pre-oxygenation with 100% oxygen Induction Type: IV induction Ventilation: Mask ventilation without difficulty Laryngoscope Size: McGrath and 3 Grade View: Grade I Tube type: Oral Tube size: 8.0 mm Number of attempts: 1 Airway Equipment and Method: Stylet and Oral airway Placement Confirmation: ETT inserted through vocal cords under direct vision, positive ETCO2 and breath sounds checked- equal and bilateral Secured at: 20 cm Tube secured with: Tape Dental Injury: Teeth and Oropharynx as per pre-operative assessment

## 2023-12-22 ENCOUNTER — Encounter: Payer: Self-pay | Admitting: Pulmonary Disease

## 2023-12-22 LAB — SURGICAL PATHOLOGY

## 2023-12-23 LAB — CULTURE, RESPIRATORY W GRAM STAIN: Culture: NO GROWTH

## 2023-12-23 LAB — ACID FAST SMEAR (AFB, MYCOBACTERIA): Acid Fast Smear: NEGATIVE

## 2023-12-23 LAB — COMP PANEL: LEUKEMIA/LYMPHOMA: PATH INTERP XXX-IMP: DETECTED

## 2023-12-23 LAB — CYTOLOGY - NON PAP

## 2024-01-08 ENCOUNTER — Other Ambulatory Visit: Payer: Self-pay | Admitting: Physician Assistant

## 2024-01-08 DIAGNOSIS — K2100 Gastro-esophageal reflux disease with esophagitis, without bleeding: Secondary | ICD-10-CM

## 2024-01-13 ENCOUNTER — Other Ambulatory Visit: Payer: Self-pay | Admitting: Internal Medicine

## 2024-01-13 DIAGNOSIS — I10 Essential (primary) hypertension: Secondary | ICD-10-CM

## 2024-01-14 NOTE — Telephone Encounter (Signed)
 Requested Prescriptions  Pending Prescriptions Disp Refills   metoprolol  tartrate (LOPRESSOR ) 25 MG tablet [Pharmacy Med Name: Metoprolol  Tartrate 25 MG Oral Tablet] 180 tablet 1    Sig: TAKE 1 AND 1/2 TABLETS BY MOUTH  TWICE DAILY     Cardiovascular:  Beta Blockers Failed - 01/14/2024  1:54 PM      Failed - Last BP in normal range    BP Readings from Last 1 Encounters:  12/21/23 (!) 150/60         Passed - Last Heart Rate in normal range    Pulse Readings from Last 1 Encounters:  12/21/23 88         Passed - Valid encounter within last 6 months    Recent Outpatient Visits           1 month ago Recurrent UTI   [facility] at [facility], Leita DEL, MD   2 months ago Annual physical exam   [facility] at [facility], Leita DEL, MD   3 months ago Left facial swelling   [facility] at [facility], Leita DEL, MD       Future Appointments             In 1 month Agbor-Etang, Redell, MD [facility] [place]   In 3 months MacDiarmid, Glendia, MD [facility]

## 2024-01-17 ENCOUNTER — Telehealth: Payer: Self-pay | Admitting: Pulmonary Disease

## 2024-01-17 ENCOUNTER — Telehealth: Payer: Self-pay

## 2024-01-17 DIAGNOSIS — J849 Interstitial pulmonary disease, unspecified: Secondary | ICD-10-CM

## 2024-01-17 MED ORDER — AEROCHAMBER MV MISC: 0 refills | Status: AC

## 2024-01-17 MED ORDER — PREDNISONE 20 MG PO TABS: 20.0000 mg | ORAL_TABLET | Freq: Every day | ORAL | 0 refills | Status: DC

## 2024-01-17 MED ORDER — PREDNISONE 20 MG PO TABS: 20.0000 mg | ORAL_TABLET | Freq: Every day | ORAL | 0 refills | Status: AC

## 2024-01-17 MED ORDER — AEROCHAMBER MV MISC: 0 refills | Status: DC

## 2024-01-17 NOTE — Telephone Encounter (Signed)
 I spoke with the patient. She has seen her labs on MyChart but would like someone to go over them with her. Bronch on 12/21/2023. Also, Avair is not helping with her chronic cough and it is causing her to loose her taste and hoarseness. She is doing 2 puffs BID and brushing her teeth and tongue afterwards. She said at first it was helping but now her cough is coming back.  Dr. Malka is out of the office. Dr. Isadora please advise.

## 2024-01-17 NOTE — Telephone Encounter (Signed)
 Called Ms. Vanriper and discussed the results of her Biopsy, it was non revealing and did not show any granulomatous disease to suggest Sarcoidosis. However, Transbronchial biopsy was not done.   I will have her see Dr. Gean in [facility] for further evaluation of her ILD.   Darrin Barn, MD [facility] 01/17/2024 12:51 PM

## 2024-01-17 NOTE — Telephone Encounter (Signed)
 Copied from CRM #[id number]. Topic: Clinical - Lab/Test Results >> Jan 17, 2024 11:09 AM Rebecca Gutierrez wrote: Reason for CRM: PT CALLING ABOUT HER LAB RESULTS FROM HER BRONCHOSCOPY DONE ON 07/08 STATED SHE HAS NOT HEARD ANYTHING. PLEASE CONTACT PT WITH SOME DETAILS, SHE ALSO WANTS TO SPEAK WITH YOU ALL ABOUT HER MEDICINES

## 2024-01-17 NOTE — Telephone Encounter (Signed)
 Per second telephone encounter from today (8/4)-   Called [name] and discussed the results of her Biopsy, it was non revealing and did not show any granulomatous disease to suggest Sarcoidosis. However, Transbronchial biopsy was not done.    I will have her see Dr. Gean in [facility] for further evaluation of her ILD.    Darrin Barn, MD [facility] 01/17/2024 12:51 PM      Closing this encounter.

## 2024-01-17 NOTE — Telephone Encounter (Signed)
 Has an appt been made for her?

## 2024-01-18 ENCOUNTER — Ambulatory Visit
Admission: RE | Admit: 2024-01-18 | Discharge: 2024-01-18 | Disposition: A | Discharge status: Home / Self Care | Source: Ambulatory Visit | Attending: Internal Medicine | Admitting: Internal Medicine

## 2024-01-18 DIAGNOSIS — Z1231 Encounter for screening mammogram for malignant neoplasm of breast: Secondary | ICD-10-CM | POA: Insufficient documentation

## 2024-01-18 NOTE — Telephone Encounter (Signed)
 Left message for patient

## 2024-01-20 LAB — FUNGUS CULTURE WITH STAIN

## 2024-01-20 LAB — FUNGUS CULTURE RESULT

## 2024-01-20 LAB — FUNGAL ORGANISM REFLEX

## 2024-01-20 NOTE — Telephone Encounter (Signed)
 Mckenzie, Rebecca Gutierrez Rebecca Darrin, MD; Rebecca Gutierrez DEL, CMA Left voicemail to call us  back. Looks like Rebecca Gutierrez with have sooner availability. Rebecca Gutierrez 1st available is November

## 2024-01-24 ENCOUNTER — Telehealth: Payer: Self-pay

## 2024-01-24 NOTE — Telephone Encounter (Signed)
 Copied from CRM #[id number]. Topic: Clinical - Medication Question >> Jan 24, 2024  9:58 AM Rilla B wrote: Dr Malka recently gave patient a 5 day supply of prednisone .  This has helped patient. She has finished the 5 days supply but wants to know if she needs another prescription to continue the med.  She does have the inhaler, should she continue both?  Please call.   ----------------------------------------------------------------------- From previous Reason for Contact - Scheduling: Patient/patient representative is calling to schedule an appointment. Refer to attachments for appointment information.

## 2024-01-24 NOTE — Telephone Encounter (Signed)
 Patient has been scheduled with Dr. Geronimo on 9/8 at 1:00pm.  Nothing further needed.

## 2024-02-02 ENCOUNTER — Telehealth: Payer: Self-pay

## 2024-02-02 NOTE — Telephone Encounter (Signed)
 Copied from CRM [id number]. Topic: Appointments - Scheduling Inquiry for Clinic >> Feb 02, 2024 11:29 AM [name] wrote: Reason for CRM: Patient [phone] states got an adapter for the inhaler and it did work stopping his coughing and mucous. Patient asked if she still needs to see Dr. Geronimo still? Patient states is having a hard time getting transporation to [facility] to see Dr. Geronimo 02/21/24 at 1 pm. Please advise and call back.

## 2024-02-02 NOTE — Telephone Encounter (Signed)
 Dr. Malka please advise. I did let the patient know you are our of the office until 8/25.

## 2024-02-03 LAB — ACID FAST CULTURE WITH REFLEXED SENSITIVITIES (MYCOBACTERIA): Acid Fast Culture: NEGATIVE

## 2024-02-03 NOTE — Telephone Encounter (Signed)
 I have notified the patient. Nothing further needed.

## 2024-02-20 NOTE — Progress Notes (Unsigned)
 JUNE 2025 Dr Victory    Subjective:   PATIENT ID: Rebecca Gutierrez GENDER: female DOB: [birth year], MRN: [id number]  Chief Complaint  Patient presents with   Follow-up    3 month follow up, mixed connective tissue disease, SOB, CT: 10/18/23, PFT: 12/02/23  Breathing has been ok, but still has a persistent wet cough and causes her to be a bit hoarse. Otherwise no complaints. Still using her Symbicort  as prescribed -- previously didn't seem to be helping but in the past couple days it seems to be more effective.    HPI [name] is an 83 year old female patient with a past medical history of GERD, hiatal hernia, hyperlipidemia and hypertension presenting to the pulmonary clinic as a referral from her primary care physician for chronic cough.  She said that back in May her cough started to worsen now it is soon as she vocalizes she starts coughing her is dry and associated with shortness of breath specifically on exertion.  She denies any heartburn denies acid taste in mouth and denies postnasal drip.  She denies any history of asthma or any trigger to the cough.  She denies any chest wheezing or chest tightness.  She saw ENT specialist underwent a laryngoscopy did not find any abnormalities.  She does follow-up with a gastroenterologist and have her on Protonix  40 mg twice daily as well as famotidine  20 mg twice daily.  PFTs with normal spirometry, normal lung volumes and moderate to severely reduced DLCO. Suggestive of PVD. PEFR was increased consistent with possible ILD.   High res CT chest 04/08/2023 - Small to moderate enlarged meds LN  with diffuse perilymphatic nodularity suggestive of sarcoidosis.   ANA 1:1280 - ScL70 48 - Anti sm 76 - Anti U1 RNP 76    She saw Dr. Tobie at [facility] and was started on MMF and Prednisone  and have not felt any different.   She also reports no difference with the inhaler and is asking to be tapered off.   She denies any systemic symptoms including  dysphagia, raynaud, joint issues, dry mouth or eyes. Denies any rashes.   Family history she denies any family history of pulmonary diseases.  Social history never smoker drinks alcohol occasionally no illicit drug use and no pets at home.  OV 12/07/2023 - Patient continues with intermittent cough. She is currently only on Cellcept 500mg  daily. Initially when she was started on prednisone  and cell cept she felt significant better, she was then tapered off her medications and cough recurred, currently back on cellcept 500mg  daily but her cough persists. Repeat PFTs with improved FVC, TLC and DLCO. Repeat HRCT 10/2023 with stable nodularity.  I think she might have mild pulmonary hypertension based on her echocardiogram. I will discuss with her need for a bronchoscopy and EBUS as CT chest findings are more suggestive of sarcoidosis then MCTD.   ROS   JUNE 2025 ILD CONFERENCE      MDD discussion of CT scan    - Date or time period of scan:  HRCT: 10/18/2023 HRCT: 04/08/2023   - Discussion synopsis:  slightly U L dominanet proess. There is nodularity. Stable Oct 2024 -? May 2025. No progerssion. More c/w SARCOID. ALTERNATIVE PATERN  - What is the final conclusion per 2018 ATS/Fleischner Criteria - More c/w SARCOID. ALTERNATIVE PATERN  - Concordance with official report: concordant  Pathology discussion of biopsy: n/a    MDD Impression/Recs: 1) CT cw/ Sarcoid but Autoimmune panel and ILD are not  in syncrhony 2) get rheum opinion ; 3) start with getting ACE level, Quant Gold, Bronc with BAL and TBBx and 4)  have ILD doc in [place] see patient  after PFT in next few months  OV 02/21/2024 -second opinion at ILD clinic with Dr. Geronimo.  Subjective:  Patient ID: Rebecca Gutierrez, female , DOB: 22-Apr-1941 , age 42 y.o. , MRN: [id number] , ADDRESS: [address] [place] [id number] PCP Justus Leita DEL, MD Patient Care Team: Justus Leita DEL, MD as PCP - General (Internal  Medicine) Darliss Rogue, MD as PCP - Cardiology (Cardiology) Gaston Hamilton, MD as Consulting Physician (Urology) Malka Domino, MD as Consulting Physician (Pulmonary Disease) [facility] (Optometry) Tobie Lady POUR, MD as Consulting Physician (Rheumatology)  This Provider for this visit: Treatment Team:  Attending Provider: Malka Domino, MD    02/21/2024 -   Chief Complaint  Patient presents with   Follow-up    ILD , referred by other pulmonologist.  Pt has no SOB, said she has a slight cough but that it is much improved.      HPI [name] 83 y.o. -being seen at the ILD clinic by Dr. Geronimo at the request of Dr. Georgiana Carmel Assaker.  She is here with her son who is an independent historian.  She has insidious onset of cough for the last 1 year.  She is told me that she has had intermittent course of prednisone  did not help but review of the records externally shows that prednisone  and CellCept have improved the cough in the past.  She brings up some mucus.  There is associated dyspnea but is extremely mild.  The cough itself is rated as moderate.  She has recently started inhaler therapy and this has helped her cough quite a bit.  She still has some occasional clearing of the throat but overall her quality of life is much improved after the inhaler.  She has had various autoimmune antibodies positive [see below] and this prompted a referral to Dr. Tobie at [facility].  She was placed on CellCept/prednisone  which she was taking as of June 2025 but since stopped because she told me that it does not work although she she did tell the pulmonologist in [place] that it was helping her.  It is unclear why she stopped therefore but she categorically denies any Raynaud's or ulcer or photosensitivity or malar rash or dryness.  She says she is puzzled why she has been visiting the rheumatologist  She was presented in the ILD  conference in June 2025 -biopsy was recommended.  This shows significant lymphocyte predominance greater than 50%.  Transbronchial biopsy was nondiagnostic..  This was in July 2025.  His CT is upper lobe predominant disease with differential diagnose of sarcoid [has made his adenopathy] versus hypersensitive pneumonitis.  Further review with her she denied any mold exposure or bird exposure or down pillow exposure.  She does have hiatal hernia and she has been on proton pump inhibitors for some years.  But then she stopped it late last year.  She was worried this was the cause of false positive ANA.  Se admitted to being on nitrofurantoin  for UTI prophylaxis because of bladder prolapse.  She has been on this for many years.   She has had serial CT and pulmonary function test and her lungs are stable and I concur with the radiologist  The overall burden of ILD is mild at this point  SYMPTOM SCALE - ILD 02/21/2024  Current weight   O2 use ra  Shortness of Breath 0 -> 5 scale with 5 being worst (score 6 If unable to do)  At rest 0  Simple tasks - showers, clothes change, eating, shaving 0  Household (dishes, doing bed, laundry) 0  Shopping 0  Walking level at own pace 0  Walking up Stairs 1  Total (30-36) Dyspnea Score 1  How bad is your cough? 2  How bad is your fatigue 0  How bad is appetiee 00  How bad is nausea 0  How bad is vomiting?  0  How bad is diarrhea? 0  How bad is anxiety? 00  How bad is depression 0  Any chronic pain - if so where and how bad 2       SIT STAND TEST - goal 15 times   02/21/2024    O2 used ra   PRobe - finter or forehead finger   Number sit and stand completed - goal 15 15   Time taken to complete 43 sec   Resting Pulse Ox/HR/Dyspnea  95% and 70/min and dyspnea of 0/10    Peak measures 89 % and 78/min and dyspnea of 0/10   Final Pulse Ox/HR 95% and 78/min and dyspnea of 0/10   Desaturated </= 88% no   Desaturated <= 3% points no   Got  Tachycardic >/= 90/min no   Miscellaneous comments x        CT Chest data from date: May 2025  - personally visualized and independently interpreted : yes - my findings are: AS below  IMPRESSION: 1. Mediastinal adenopathy and upper/midlung zone predominant Peri lymphatic thickening and nodularity, findings indicative of sarcoid, stable from 04/08/2023. Findings are suggestive of an alternative diagnosis (not UIP) per consensus guidelines: Diagnosis of Idiopathic Pulmonary Fibrosis: An Official ATS/ERS/JRS/ALAT Clinical Practice Guideline. Am JINNY Honey Crit Care Med Vol 198, Iss 5, [phone], Feb 13 2017. 2. Moderate hiatal hernia. 3. Aortic atherosclerosis (ICD10-I70.0). Coronary artery calcification.     Electronically Signed   By: Newell Eke M.D.   On: 10/27/2023 15:29      LAB RESULTS last 96 hours No results found.   Latest Reference Range & Units 04/27/23 10:13  Speckled Pattern <1:40  >1:1280 !  Anti-Nuclear Ab by IFA (RDL) Negative  Positive !  Note:  Comment  Anti-Ro (SS-A) Ab (RDL) <20 Units <20  Anti-Jo-1 Ab (RDL) <20 Units <20  Anti-PL-7 Ab (RDL) Negative  Negative  Anti-PL-12 Ab (RDL) Negative  Negative  Anti-EJ Ab (RDL) Negative  Negative  Anti-OJ Ab (RDL) Negative  Negative  Anti-SRP Ab (RDL) Negative  Negative  Anti-Mi-2 Ab (RDL) Negative  Negative  Anti-TIF-1gamma Ab (RDL) <20 Units <20  Anti-MDA-5 Ab (CADM-140)(RDL) <20 Units <20  Anti-NXP-2 (P140) Ab (RDL) <20 Units <20  Anti-SAE1 Ab, IgG (RDL) <20 Units <20  Anti-PM/Scl-100 Ab (RDL) <20 Units <20  Anti-Ku Ab (RDL) Negative  Negative  Anti-SS-A 52kD Ab, IgG (RDL) <20 Units <20  Anti-U1 RNP Ab (RDL) <20 Units <20 Units 76 (H) 105 (H)  Anti-U2 RNP Ab (RDL) Negative  Negative  Anti-U3 RNP (Fibrillarin)(RDL) Negative  Negative  ANA Plus 12 Interpretation  Comment  Repeat Anti-Scl-70 Negative  P  Anti-La (SS-B) Ab (RDL) <20 Units <20  Anti-Chromatin Ab, IgG (RDL) <20 Units <20  !: Data is  abnormal (H): Data is abnormally high   Latest Reference Range & Units 12/21/23 12:32  Monocyte-Macrophage-Serous Fluid 50 -  90 % 24 (L)  Other Cells, Fluid % LINING CELLS PRESENT  Color, Fluid  COLORLESS  Total Nucleated Cell Count, Fluid 0 - 1,000 cu mm 150  Fluid Type-FCT  BRONCHIAL ALVEOLAR LAVAGE (C)  Lymphs, Fluid % 55  Eos, Fluid % 15  Appearance, Fluid CLEAR  TURBID !  Neutrophil Count, Fluid 0 - 25 % 6  (L): Data is abnormally low !: Data is abnormal (C): Corrected       Latest Ref Rng & Units 12/02/2023   10:51 AM 04/13/2023   11:26 AM  PFT Results  FVC-Pre L 2.52  2.22  P  FVC-Predicted Pre % 113  98  P  FVC-Post L  2.20  P  FVC-Predicted Post %  97  P  Pre FEV1/FVC % % 71  84  P  Post FEV1/FCV % %  85  P  FEV1-Pre L 1.78  1.86  P  FEV1-Predicted Pre % 109  112  P  FEV1-Post L  1.87  P  DLCO uncorrected ml/min/mmHg 10.10  7.49  P  DLCO UNC% % 60  44  P  DLVA Predicted % 61  51  P  TLC L 4.29  3.83  P  TLC % Predicted % 93  83  P  RV % Predicted % 72  81  P    P Preliminary result     has a past medical history of Abnormal ECG (10/28/2017), Allergy, Anxiety, Aortic atherosclerosis (HCC), Bochdalek hernia (bilateral), Bradycardia (11/10/2017), Chronic cough, Coronary artery disease, Cystocele, midline, Disorder of bilirubin excretion, Dyspnea, Frequent PVCs, GERD (gastroesophageal reflux disease), Gilbert's syndrome, Hearing loss (1975), Hepatic steatosis, History of bilateral cataract extraction, History of hiatal hernia, History of recurrent UTIs, Hoarseness, Hyperlipidemia (2000), Hypertension (2000), ILD (interstitial lung disease) (HCC), Long-term use of aspirin therapy, MVP (mitral valve prolapse), Nephrolithiasis, Pulmonary HTN (HCC), Sarcoidosis, Skin cancer, and Systemic lupus (HCC).   reports that she has never smoked. She has never used smokeless tobacco.  Past Surgical History:  Procedure Laterality Date   ANTERIOR AND POSTERIOR REPAIR N/A  05/02/2013   Procedure: ANTERIOR (CYSTOCELE) AND POSTERIOR REPAIR (RECTOCELE);  Surgeon: Norleen LULLA Server, MD;  Location: [facility] ORS;  Service: Gynecology;  Laterality: N/A;   BRAVO PH STUDY N/A 10/21/2021   Procedure: BRAVO PH STUDY;  Surgeon: Jinny Carmine, MD;  Location[facility] ENDOSCOPY;  Service: Endoscopy;  Laterality: N/A;   CATARACT EXTRACTION, BILATERAL     CHOLECYSTECTOMY  10/15/2020   COLONOSCOPY  08/28/2011   Procedure: COLONOSCOPY;  Surgeon: Claudis RAYMOND Rivet, MD;  Location: [facility] ENDO SUITE;  Service: Endoscopy;  Laterality: N/A;  930   ESOPHAGOGASTRODUODENOSCOPY N/A 10/21/2021   Procedure: ESOPHAGOGASTRODUODENOSCOPY (EGD);  Surgeon: Jinny Carmine, MD;  Location: [facility] ENDOSCOPY;  Service: Endoscopy;  Laterality: N/A;   EYE SURGERY Bilateral 02/13/2011   other 2013   MOHS SURGERY  06/2016   Removed skin cancer from Rt shin   TUBAL LIGATION     VAGINAL HYSTERECTOMY N/A 05/02/2013   Procedure: HYSTERECTOMY VAGINAL;  Surgeon: Norleen LULLA Server, MD;  Location: [facility]S;  Service: Gynecology;  Laterality: N/A;   VIDEO BRONCHOSCOPY WITH ENDOBRONCHIAL ULTRASOUND Bilateral 12/21/2023   Procedure: BRONCHOSCOPY, WITH EBUS;  Surgeon: Malka Domino, MD;  Location: [facility] ORS;  Service: Pulmonary;  Laterality: Bilateral;    Allergies  Allergen Reactions   Ciprofloxacin  Other (See Comments)    Tendinitis    Levaquin  [Levofloxacin ] Other (See Comments)    insomnia   Potassium Clavulanate [Clavulanic Acid] Diarrhea  Macrobid  [Nitrofurantoin ]     Per Dr. Geronimo - Do not ever take this   [name] Diarrhea   [name]  Palpitations and Rash    Last reaction was in 1969    Immunization History  Administered Date(s) Administered   Fluad Quad(high Dose 65+) 04/18/2019, 04/29/2020, 04/23/2022   Fluad Trivalent(High Dose 65+) 05/03/2023   INFLUENZA, HIGH DOSE SEASONAL PF 03/31/2017, 04/22/2018, 03/19/2021   Influenza Split 04/01/2015   Influenza Whole 03/25/2006,  05/25/2011   Influenza,inj,Quad PF,6+ Mos 03/01/2013, 03/14/2014, 03/24/2016   PFIZER(Purple Top)SARS-COV-2 Vaccination 06/29/2019, 07/20/2019, 04/03/2020   PNEUMOCOCCAL CONJUGATE-20 03/02/2023   Pfizer Covid-19 Vaccine Bivalent Booster 40yrs & up 03/11/2021   Pfizer(Comirnaty)Fall Seasonal Vaccine 12 years and older 04/22/2023   Pneumococcal Conjugate-13 09/17/2014   Pneumococcal Polysaccharide-23 09/23/2009   Td 02/04/2004, 03/14/2014   Td (Adult), 2 Lf Tetanus Toxid, Preservative Free 02/04/2004, 03/14/2014   Zoster Recombinant(Shingrix) 01/09/2019, 05/18/2019   Zoster, Live 07/17/2010    Family History  Problem Relation Age of Onset   Hypertension Mother    Cancer Mother        stomach   Healthy Father    Healthy Brother    Colon cancer Neg Hx    Prostate cancer Neg Hx    Kidney cancer Neg Hx    Bladder Cancer Neg Hx    Breast cancer Neg Hx    Heart disease Neg Hx      Current Outpatient Medications:    aspirin EC 81 MG tablet, Take 81 mg by mouth at bedtime. , Disp: , Rfl:    Cholecalciferol 50 MCG (2000 UT) TABS, Take 1 capsule by mouth in the morning., Disp: , Rfl:    estradiol  (ESTRACE  VAGINAL) 0.1 MG/GM vaginal cream, Apply 0.5mg  (pea-sized amount)  just inside the vaginal introitus with a finger-tip on Monday, Wednesday and Friday nights., Disp: 30 g, Rfl: 0   famotidine  (PEPCID ) 20 MG tablet, TAKE 1 TABLET BY MOUTH TWICE  DAILY, Disp: 180 tablet, Rfl: 3   fluticasone -salmeterol (ADVAIR HFA) 230-21 MCG/ACT inhaler, Inhale 2 puffs into the lungs 2 (two) times daily., Disp: 1 each, Rfl: 12   hydrochlorothiazide  (HYDRODIURIL ) 25 MG tablet, TAKE 1 TABLET BY MOUTH DAILY (Patient taking differently: Take 25 mg by mouth every morning.), Disp: 90 tablet, Rfl: 3   metoprolol  tartrate (LOPRESSOR ) 25 MG tablet, TAKE 1 AND 1/2 TABLETS BY MOUTH  TWICE DAILY, Disp: 180 tablet, Rfl: 1   nitrofurantoin  (MACRODANTIN ) 100 MG capsule, Take 1 capsule (100 mg total) by mouth at bedtime.,  Disp: 90 capsule, Rfl: 3   simvastatin  (ZOCOR ) 10 MG tablet, Take 1 tablet (10 mg total) by mouth daily., Disp: 90 tablet, Rfl: 3   Spacer/Aero-Holding Chambers (AEROCHAMBER MV) inhaler, Use as instructed, Disp: 1 each, Rfl: 0      Objective:   Vitals:   02/21/24 1302  BP: (!) 183/90  Pulse: 69  Temp: 98.2 F (36.8 C)  SpO2: 96%  Weight: 129 lb 9.6 oz (58.8 kg)  Height: 5' 1 (1.549 m)    Estimated body mass index is 24.49 kg/m as calculated from the following:   Height as of this encounter: 5' 1 (1.549 m).   Weight as of this encounter: 129 lb 9.6 oz (58.8 kg).  @WEIGHTCHANGE @  [employer]   02/21/24 1302  Weight: 129 lb 9.6 oz (58.8 kg)     Physical Exam   General: No distress. Looks well, some clearing of throast O2 at rest: no Cane present: no Sitting in wheel  chair: no Frail: no Obese: no Neuro: Alert and Oriented x 3. GCS 15. Speech normal Psych: Pleasant Resp:  Barrel Chest - no.  Wheeze - no, Crackles - no, No overt respiratory distress CVS: Normal heart sounds. Murmurs - no Ext: Stigmata of Connective Tissue Disease - no HEENT: Normal upper airway. PEERL +. No post nasal drip        Assessment/     Assessment & Plan ILD (interstitial lung disease) (HCC)  Adverse effect of nitrofurantoin , initial encounter  Chronic cough  Hiatal hernia  ANA positive  I think several features fitting with nitrofurantoin  related chronic lung injury and this includes lymphocytosis, history of taking nitrofurantoin  for some years and age group.  The first episode stop nitrofurantoin .  She was worried about getting recurrent UTI but I did categorically tell her that she will have to stop nitrofurantoin  around the risk of permanent left lung injury and disability and potential mortality with respiratory failure.  Her son agreed with this.  I have asked her to reach out to the urologist to get an alternative prescription for UTI prophylaxis.  For now opted to  continue with inhaler therapy because there are risk effects from systemic prednisone   I will see her 1 time via video but she prefers to follow-up with Dr. Malka at [place].  Recommended serial monitoring and to monitor for progressive phenotype.  Symptoms can be controlled/potentially improved with a 2 to 56-month prednisone  course but this can be evaluated at the time of follow-up.  Certainly over the long period of time if she has progressive phenotype then antifibrotic's or CellCept can be used    False positive autoimmune antibodies have been reported with nitrofurantoin  toxicity and in the absence of connective tissue disease which should have an open mind about these autoimmune antibodies being related to nitrofurantoin   PLAN Patient Instructions     ICD-10-CM   1. ILD (interstitial lung disease) (HCC)  J84.9     2. Adverse effect of nitrofurantoin , initial encounter  T37.8X5A     3. Chronic cough  R05.3     4. Hiatal hernia  K44.9       I think this is marcobid lung \\toxicity  wi [place] lung disease is mild and stable but you are at risk for progression if you continue Macrobid  I think the positive autoimmune antibodies are likely to macrobid0.  Plan  - STOP MACROBID  -   - CMA to list this as allergy  - DO NOT EVERY TAKE THIS  - If you can do this, I am optimistic of a stable outcome in future  - Talk to Urologist and get another antibiotic for prolpsed bladder - continue inhaled steroid - recommend serial spirometry and monitring with Dr Malka in [place] - Other things to prevent ILD from getting worse  - acid reflux control    - regular respiratory vaccines  -avoid sick contact  Followup - keep 03/27/24 visit with DR Victory   - he can do breathing test every 3-6 months and CT scanas required  - video visit with Dr Geronimo in 8 weeks to report progress  - if cough horrible , can consider 4-8 week prednisone  course  - for progrssive disease (if  ever this happens) then there are options like cellcept, ofev etc.,      FOLLOWUP    Followup - keep 03/27/24 visit with DR Victory   - he can do breathing test every 3-6 months and CT scanas required  - video  visit with Dr Geronimo in 8 weeks to report progress  - if cough horrible , can consider 4-8 week prednisone  course  - for progrssive disease (if ever this happens) then there are options like cellcept, ofev etc.,   ( Level 05 visit E&M 2024: Estb >= 40 min n  visit type: on-site physical face to visit  in total care time and counseling or/and coordination of care by this undersigned MD - Dr Dorethia Geronimo. This includes one or more of the following on this same day 02/21/2024: pre-charting, chart review, note writing, documentation discussion of test results, diagnostic or treatment recommendations, prognosis, risks and benefits of management options, instructions, education, compliance or risk-factor reduction. It excludes time spent by the CMA or office staff in the care of the patient. Actual time 45 min)    SIGNATURE    Dr. Dorethia Geronimo, M.D., F.C.C.P,  Pulmonary and Critical Care Medicine Staff Physician, [facility] Director - Interstitial Lung Disease  Program  Pulmonary Fibrosis [facility] at [facility] [place], [place], [address]  Pager: [phone], If no answer or between  15:00h - 7:00h: call [phone] Telephone: [phone]  6:18 PM 02/21/2024

## 2024-02-21 ENCOUNTER — Ambulatory Visit (INDEPENDENT_AMBULATORY_CARE_PROVIDER_SITE_OTHER): Admitting: Internal Medicine

## 2024-02-21 ENCOUNTER — Encounter: Payer: Self-pay | Admitting: Internal Medicine

## 2024-02-21 VITALS — BP 183/90 | HR 69 | Temp 98.2°F | Ht 61.0 in | Wt 129.6 lb

## 2024-02-21 DIAGNOSIS — R768 Other specified abnormal immunological findings in serum: Secondary | ICD-10-CM

## 2024-02-21 DIAGNOSIS — T378X5A Adverse effect of other specified systemic anti-infectives and antiparasitics, initial encounter: Secondary | ICD-10-CM

## 2024-02-21 DIAGNOSIS — R053 Chronic cough: Secondary | ICD-10-CM

## 2024-02-21 DIAGNOSIS — J849 Interstitial pulmonary disease, unspecified: Secondary | ICD-10-CM | POA: Diagnosis not present

## 2024-02-21 DIAGNOSIS — K449 Diaphragmatic hernia without obstruction or gangrene: Secondary | ICD-10-CM

## 2024-02-21 NOTE — Patient Instructions (Addendum)
 ICD-10-CM   1. ILD (interstitial lung disease) (HCC)  J84.9     2. Adverse effect of nitrofurantoin , initial encounter  T37.8X5A     3. Chronic cough  R05.3     4. Hiatal hernia  K44.9       I think this is marcobid lung \\toxicity  wi [place] lung disease is mild and stable but you are at risk for progression if you continue Macrobid  I think the positive autoimmune antibodies are likely to macrobid0.  Plan  - STOP MACROBID  -   - CMA to list this as allergy  - DO NOT EVERY TAKE THIS  - If you can do this, I am optimistic of a stable outcome in future  - Talk to Urologist and get another antibiotic for prolpsed bladder - continue inhaled steroid - recommend serial spirometry and monitring with Dr Malka in [place] - Other things to prevent ILD from getting worse  - acid reflux control    - regular respiratory vaccines  -avoid sick contact  Followup - keep 03/27/24 visit with DR Victory   - he can do breathing test every 3-6 months and CT scanas required  - video visit with Dr Geronimo in 8 weeks to report progress  - if cough horrible , can consider 4-8 week prednisone  course  - for progrssive disease (if ever this happens) then there are options like cellcept, ofev etc.,

## 2024-02-22 ENCOUNTER — Telehealth: Payer: Self-pay | Admitting: Urology

## 2024-02-22 DIAGNOSIS — N3020 Other chronic cystitis without hematuria: Secondary | ICD-10-CM

## 2024-02-22 NOTE — Telephone Encounter (Signed)
 Patient called and stated that her Pulmonary doctor advised her to stop taking Nitrofurantoin . He thinks it may have something to do with her coughing issues. She is wanting to know if there is something else she can take to help with her bladder/urinary issues instead. Pharmacy is [facility] in [place].  Please advise patient.

## 2024-02-23 NOTE — Addendum Note (Signed)
 Addended byBETHA CORIE PLATER on: 02/23/2024 04:53 PM   Modules accepted: Orders

## 2024-02-25 ENCOUNTER — Ambulatory Visit: Attending: Cardiology | Admitting: Cardiology

## 2024-02-25 ENCOUNTER — Encounter: Payer: Self-pay | Admitting: Cardiology

## 2024-02-25 VITALS — BP 164/78 | HR 66 | Ht 61.0 in | Wt 130.2 lb

## 2024-02-25 DIAGNOSIS — I2510 Atherosclerotic heart disease of native coronary artery without angina pectoris: Secondary | ICD-10-CM | POA: Diagnosis not present

## 2024-02-25 DIAGNOSIS — E7800 Pure hypercholesterolemia, unspecified: Secondary | ICD-10-CM | POA: Diagnosis not present

## 2024-02-25 DIAGNOSIS — I10 Essential (primary) hypertension: Secondary | ICD-10-CM | POA: Diagnosis not present

## 2024-02-25 NOTE — Progress Notes (Signed)
 Cardiology Office Note:    Date:  02/25/2024   ID:  EZMA REHM, DOB 1941/06/11, MRN [id number]  PCP:  Justus Leita DEL, MD    HeartCare Providers Cardiologist:  Redell Cave, MD     Referring MD: Justus Leita DEL, MD   Chief Complaint  Patient presents with   Follow-up    No cardiac complaints;     History of Present Illness:    Rebecca Gutierrez is a 83 y.o. female with a hx of CAD (LAD, RCA calcifications on chest CT ),  hyperlipidemia, whitecoat syndrome with hypertension, hard of hearing presenting for follow-up.  Denies chest pain or shortness of breath.  BP at home usually better controlled with systolics in the 130s to 140s.  Compliant with aspirin, simvastatin  as prescribed.  Has some dry cough, recently told he could be from nitrofurantoin .  Following up with pulmonary medicine regarding this.  Takes nitrofurantoin  to reduce bladder infection.  Doing okay from the cardiac perspective.  Prior testing high-resolution CT obtained 03/2023 showed coronary calcifications in LAD and RCA.   Echocardiogram 05/2023 EF 55 to 60%, normal diastolic function, moderate LVH.  Past Medical History:  Diagnosis Date   Abnormal ECG 10/28/2017   Allergy    Anxiety    Aortic atherosclerosis (HCC)    Bochdalek hernia (bilateral)    Bradycardia 11/10/2017   Chronic cough    Coronary artery disease    Cystocele, midline    Disorder of bilirubin excretion    Dyspnea    Frequent PVCs    GERD (gastroesophageal reflux disease)    Gilbert's syndrome    Hearing loss 1975   Hepatic steatosis    History of bilateral cataract extraction    History of hiatal hernia    History of recurrent UTIs    Hoarseness    Hyperlipidemia 2000   Hypertension 2000   ILD (interstitial lung disease) (HCC)    Long-term use of aspirin therapy    MVP (mitral valve prolapse)    Nephrolithiasis    Pulmonary HTN (HCC)    Sarcoidosis    Skin cancer    Systemic lupus (HCC)      Past Surgical History:  Procedure Laterality Date   ANTERIOR AND POSTERIOR REPAIR N/A 05/02/2013   Procedure: ANTERIOR (CYSTOCELE) AND POSTERIOR REPAIR (RECTOCELE);  Surgeon: Norleen LULLA Server, MD;  Location: [facility];  Service: Gynecology;  Laterality: N/A;   BRAVO PH STUDY N/A 10/21/2021   Procedure: BRAVO PH STUDY;  Surgeon: Jinny Carmine, MD;  Location: [facility] ENDOSCOPY;  Service: Endoscopy;  Laterality: N/A;   CATARACT EXTRACTION, BILATERAL     CHOLECYSTECTOMY  10/15/2020   COLONOSCOPY  08/28/2011   Procedure: COLONOSCOPY;  Surgeon: Claudis RAYMOND Rivet, MD;  Location: [facility] ENDO SUITE;  Service: Endoscopy;  Laterality: N/A;  930   ESOPHAGOGASTRODUODENOSCOPY N/A 10/21/2021   Procedure: ESOPHAGOGASTRODUODENOSCOPY (EGD);  Surgeon: Jinny Carmine, MD;  Location: [facility] ENDOSCOPY;  Service: Endoscopy;  Laterality: N/A;   EYE SURGERY Bilateral 02/13/2011   other 2013   MOHS SURGERY  06/2016   Removed skin cancer from Rt shin   TUBAL LIGATION     VAGINAL HYSTERECTOMY N/A 05/02/2013   Procedure: HYSTERECTOMY VAGINAL;  Surgeon: Norleen LULLA Server, MD;  Location: [facility];  Service: Gynecology;  Laterality: N/A;   VIDEO BRONCHOSCOPY WITH ENDOBRONCHIAL ULTRASOUND Bilateral 12/21/2023   Procedure: BRONCHOSCOPY, WITH EBUS;  Surgeon: Malka Domino, MD;  Location: [facility] ORS;  Service: Pulmonary;  Laterality: Bilateral;    Current Medications: Current  Meds  Medication Sig   aspirin EC 81 MG tablet Take 81 mg by mouth at bedtime.    Cholecalciferol 50 MCG (2000 UT) TABS Take 1 capsule by mouth in the morning.   estradiol  (ESTRACE  VAGINAL) 0.1 MG/GM vaginal cream Apply 0.5mg  (pea-sized amount)  just inside the vaginal introitus with a finger-tip on Monday, Wednesday and Friday nights.   famotidine  (PEPCID ) 20 MG tablet TAKE 1 TABLET BY MOUTH TWICE  DAILY   fluticasone -salmeterol (ADVAIR HFA) 230-21 MCG/ACT inhaler Inhale 2 puffs into the lungs 2 (two) times daily.   hydrochlorothiazide  (HYDRODIURIL ) 25 MG  tablet TAKE 1 TABLET BY MOUTH DAILY (Patient taking differently: Take 25 mg by mouth every morning.)   metoprolol  tartrate (LOPRESSOR ) 25 MG tablet TAKE 1 AND 1/2 TABLETS BY MOUTH  TWICE DAILY   nitrofurantoin  (MACRODANTIN ) 100 MG capsule Take 1 capsule (100 mg total) by mouth at bedtime.   simvastatin  (ZOCOR ) 10 MG tablet Take 1 tablet (10 mg total) by mouth daily.   Spacer/Aero-Holding Chambers (AEROCHAMBER MV) inhaler Use as instructed     Allergies:   Ciprofloxacin , Levaquin  [levofloxacin ], Potassium clavulanate [clavulanic acid], Macrobid  [nitrofurantoin ], [name], and [name]    Social History   Socioeconomic History   Marital status: Widowed    Spouse name: Not on file   Number of children: 2   Years of education: 52   Highest education level: 12th grade  Occupational History   Occupation: Retired  Tobacco Use   Smoking status: Never   Smokeless tobacco: Never  Vaping Use   Vaping status: Never Used  Substance and Sexual Activity   Alcohol use: Not Currently    Alcohol/week: 1.0 standard drink of alcohol    Types: 1 Standard drinks or equivalent per week   Drug use: Never   Sexual activity: Not Currently    Partners: Male    Birth control/protection: Post-menopausal, Surgical  Other Topics Concern   Not on file  Social History Narrative   Pt lives alone   Social Drivers of Health   Financial Resource Strain: Low Risk  (12/08/2023)   Overall Financial Resource Strain (CARDIA)    Difficulty of Paying Living Expenses: Not hard at all  Food Insecurity: No Food Insecurity (12/08/2023)   Hunger Vital Sign    Worried About Running Out of Food in the Last Year: Never true    Ran Out of Food in the Last Year: Never true  Transportation Needs: No Transportation Needs (12/08/2023)   PRAPARE - Administrator, Civil Service (Medical): No    Lack of Transportation (Non-Medical): No  Physical Activity: Inactive (12/08/2023)   Exercise  Vital Sign    Days of Exercise per Week: 0 days    Minutes of Exercise per Session: 0 min  Stress: No Stress Concern Present (12/08/2023)   [employer] of Occupational Health - Occupational Stress Questionnaire    Feeling of Stress: Not at all  Social Connections: Socially Isolated (12/08/2023)   Social Connection and Isolation Panel    Frequency of Communication with Friends and Family: More than three times a week    Frequency of Social Gatherings with Friends and Family: More than three times a week    Attends Religious Services: Never    Database administrator or Organizations: No    Attends Banker Meetings: Never    Marital Status: Widowed     Family History: The patient's family history includes Cancer in her mother; Healthy in her brother and  father; Hypertension in her mother. There is no history of Colon cancer, Prostate cancer, Kidney cancer, Bladder Cancer, Breast cancer, or Heart disease.  ROS:   Please see the history of present illness.     All other systems reviewed and are negative.  EKGs/Labs/Other Studies Reviewed:    The following studies were reviewed today:  EKG Interpretation Date/Time:  Friday February 25 2024 11:57:15 EDT Ventricular Rate:  66 PR Interval:  170 QRS Duration:  92 QT Interval:  402 QTC Calculation: 421 R Axis:   2  Text Interpretation: Normal sinus rhythm Nonspecific ST abnormality Confirmed by Darliss Rogue ([id number]) on 02/25/2024 12:18:34 PM    Recent Labs: 11/01/2023: Hemoglobin 13.7; Platelets 246; TSH 2.740 12/16/2023: BUN 18; Creatinine, Ser 0.71; Potassium 3.6; Sodium 136  Recent Lipid Panel    Component Value Date/Time   CHOL 159 11/01/2023 1101   TRIG 81 11/01/2023 1101   HDL 62 11/01/2023 1101   CHOLHDL 2.6 11/01/2023 1101   CHOLHDL 2.1 09/25/2015 0822   VLDL 13 09/25/2015 0822   LDLCALC 82 11/01/2023 1101     Risk Assessment/Calculations:         Physical Exam:    VS:  BP (!) 164/78 (BP  Location: Left Arm, Patient Position: Sitting, Cuff Size: Normal)   Pulse 66   Ht 5' 1 (1.549 m)   Wt 130 lb 3.2 oz (59.1 kg)   SpO2 94%   BMI 24.60 kg/m     Wt Readings from Last 3 Encounters:  02/25/24 130 lb 3.2 oz (59.1 kg)  02/21/24 129 lb 9.6 oz (58.8 kg)  12/21/23 128 lb 15.5 oz (58.5 kg)     GEN:  Well nourished, well developed in no acute distress HEENT: Normal NECK: No JVD; No carotid bruits CARDIAC: RRR, no murmurs, rubs, gallops RESPIRATORY:  Clear to auscultation without rales, wheezing or rhonchi  ABDOMEN: Soft, non-tender, non-distended MUSCULOSKELETAL:  No edema; No deformity  SKIN: Warm and dry NEUROLOGIC:  Alert and oriented x 3 PSYCHIATRIC:  Normal affect   ASSESSMENT:    1. Coronary artery disease involving native coronary artery of native heart, unspecified whether angina present   2. White coat syndrome with diagnosis of hypertension   3. Pure hypercholesterolemia    PLAN:    In order of problems listed above:  CAD(LAD, RCA calcifications on chest CT).  Denies chest pain.  Continue aspirin, simvastatin .  Echo showed normal EF 55 to 60%.   Hypertension with whitecoat syndrome.  Continue HCTZ 25 mg daily, Lopressor  37.5 mg twice daily. Hyperlipidemia, simvastatin  10 mg daily.  Follow-up in 12 months.     Medication Adjustments/Labs and Tests Ordered: Current medicines are reviewed at length with the patient today.  Concerns regarding medicines are outlined above.  Orders Placed This Encounter  Procedures   EKG 12-Lead   No orders of the defined types were placed in this encounter.   Patient Instructions  Medication Instructions:  Your physician recommends that you continue on your current medications as directed. Please refer to the Current Medication list given to you today.   *If you need a refill on your cardiac medications before your next appointment, please call your pharmacy*  Lab Work: No labs ordered today  If you have labs  (blood work) drawn today and your tests are completely normal, you will receive your results only by: MyChart Message (if you have MyChart) OR A paper copy in the mail If you have any lab test that is abnormal or  we need to change your treatment, we will call you to review the results.  Testing/Procedures: No test ordered today   Follow-Up: At [facility], you and your health needs are our priority.  As part of our continuing mission to provide you with exceptional heart care, our providers are all part of one team.  This team includes your primary Cardiologist (physician) and Advanced Practice Providers or APPs (Physician Assistants and Nurse Practitioners) who all work together to provide you with the care you need, when you need it.  Your next appointment:   1 year(s)  Provider:   You may see Redell Cave, MD or one of the following Advanced Practice Providers on your designated Care Team:   Lonni Meager, NP Lesley Maffucci, PA-C Bernardino Bring, PA-C Cadence Garibaldi, PA-C Tylene Lunch, NP Barnie Hila, NP    We recommend signing up for the patient portal called MyChart.  Sign up information is provided on this After Visit Summary.  MyChart is used to connect with patients for Virtual Visits (Telemedicine).  Patients are able to view lab/test results, encounter notes, upcoming appointments, etc.  Non-urgent messages can be sent to your provider as well.   To learn more about what you can do with MyChart, go to [email or web address].            Signed, Redell Cave, MD  02/25/2024 12:47 PM    [facility]

## 2024-02-25 NOTE — Patient Instructions (Signed)

## 2024-02-28 ENCOUNTER — Telehealth: Payer: Self-pay | Admitting: Urology

## 2024-02-28 MED ORDER — CEPHALEXIN 250 MG PO CAPS: 250.0000 mg | ORAL_CAPSULE | Freq: Every day | ORAL | 0 refills | Status: DC

## 2024-02-28 NOTE — Addendum Note (Signed)
 Addended byBETHA CORIE PLATER on: 02/28/2024 05:16 PM   Modules accepted: Orders

## 2024-02-28 NOTE — Telephone Encounter (Signed)
 Pt informed, medication sent in. pt voiced understanding.

## 2024-02-28 NOTE — Telephone Encounter (Signed)
 Pt called and requested bladder infection prescription and would like to receive a call back with clarification

## 2024-03-27 ENCOUNTER — Ambulatory Visit: Admitting: Pulmonary Disease

## 2024-03-27 ENCOUNTER — Encounter: Payer: Self-pay | Admitting: Pulmonary Disease

## 2024-03-27 DIAGNOSIS — J849 Interstitial pulmonary disease, unspecified: Secondary | ICD-10-CM

## 2024-03-27 DIAGNOSIS — J4540 Moderate persistent asthma, uncomplicated: Secondary | ICD-10-CM | POA: Diagnosis not present

## 2024-03-27 DIAGNOSIS — R0602 Shortness of breath: Secondary | ICD-10-CM

## 2024-03-27 MED ORDER — FLUTICASONE-SALMETEROL 230-21 MCG/ACT IN AERO: 2.0000 | INHALATION_SPRAY | Freq: Two times a day (BID) | RESPIRATORY_TRACT | 12 refills | Status: AC

## 2024-03-27 NOTE — Progress Notes (Signed)
 Synopsis: Referred in by Justus Leita DEL, MD   Subjective:   PATIENT ID: Rebecca Gutierrez GENDER: female DOB: December 14, 1940, MRN: [id number]  Chief Complaint  Patient presents with   Interstitial Lung Disease    No SOB, cough or wheezing. Has some drainage today.  Using Advair BID.     HPI Ms. Soy is an 83 year old female patient with a past medical history of GERD, hiatal hernia, hyperlipidemia and hypertension presenting to the pulmonary clinic as a referral from her primary care physician for chronic cough.  She said that back in May her cough started to worsen now it is soon as she vocalizes she starts coughing her is dry and associated with shortness of breath specifically on exertion.  She denies any heartburn denies acid taste in mouth and denies postnasal drip.  She denies any history of asthma or any trigger to the cough.  She denies any chest wheezing or chest tightness.  She saw ENT specialist underwent a laryngoscopy did not find any abnormalities.  She does follow-up with a gastroenterologist and have her on Protonix  40 mg twice daily as well as famotidine  20 mg twice daily.  PFTs with normal spirometry, normal lung volumes and moderate to severely reduced DLCO. Suggestive of PVD. PEFR was increased consistent with possible ILD.   High res CT chest 04/08/2023 - Small to moderate enlarged meds LN  with diffuse perilymphatic nodularity suggestive of sarcoidosis.   ANA 1:1280 - ScL70 48 - Anti sm 76 - Anti U1 RNP 76    She saw Dr. Tobie at [facility] and was started on MMF and Prednisone  and have not felt any different.   She also reports no difference with the inhaler and is asking to be tapered off.   She denies any systemic symptoms including dysphagia, raynaud, joint issues, dry mouth or eyes. Denies any rashes.   Family history she denies any family history of pulmonary diseases.  Social history never smoker drinks alcohol occasionally no illicit drug use and no pets  at home.  OV 12/07/2023 - Patient continues with intermittent cough. She is currently only on Cellcept 500mg  daily. Initially when she was started on prednisone  and cell cept she felt significant better, she was then tapered off her medications and cough recurred, currently back on cellcept 500mg  daily but her cough persists. Repeat PFTs with improved FVC, TLC and DLCO. Repeat HRCT 10/2023 with stable nodularity.  I think she might have mild pulmonary hypertension based on her echocardiogram. I will discuss with her need for a bronchoscopy and EBUS as CT chest findings are more suggestive of sarcoidosis then MCTD.   OV 03/27/2024 - [name] is feeling good with Advair 230 and Aerochamber. She saw Dr. Geronimo in for an ILD visit and thought her ILD is secondary to Nitrofurantoin  which she has been on for 3 years. She is currenly off of that and on Keflex  daily. We will plan to repeat her CT chest and PFTs in 6 months. I advised her to go down on her advair to 1 puff bid however should she have any recurrent symptoms she should go back to 2 puffs bid.   ROS All systems were reviewed and are negative except for the above.  Objective:   Vitals:   03/27/24 1114  BP: (!) 144/82  Pulse: 67  Temp: 97.8 F (36.6 C)  SpO2: 97%  Weight: 131 lb 12.8 oz (59.8 kg)  Height: 5' 1 (1.549 m)   97% on RA BMI  Readings from Last 3 Encounters:  03/27/24 24.90 kg/m  02/25/24 24.60 kg/m  02/21/24 24.49 kg/m   Wt Readings from Last 3 Encounters:  03/27/24 131 lb 12.8 oz (59.8 kg)  02/25/24 130 lb 3.2 oz (59.1 kg)  02/21/24 129 lb 9.6 oz (58.8 kg)    Physical Exam GEN: NAD, Healthy Appearing HEENT: Supple Neck, Reactive Pupils, EOMI  CVS: Normal S1, Normal S2, RRR, No murmurs or ES appreciated  Lungs: Clear bilateral air entry.  Abdomen: Soft, non tender, non distended, + BS  Extremities: Warm and well perfused, No edema  Skin: No suspicious lesions appreciated  Psych: Normal  Affect  Ancillary Information   CBC    Component Value Date/Time   WBC 8.3 11/01/2023 1101   WBC 3.4 (L) 03/29/2015 0912   RBC 4.54 11/01/2023 1101   RBC 5 01/13/2018 1147   RBC 4.49 03/29/2015 0912   HGB 13.7 11/01/2023 1101   HCT 41.8 11/01/2023 1101   PLT 246 11/01/2023 1101   MCV 92 11/01/2023 1101   MCV 91 10/13/2011 1515   MCH 30.2 11/01/2023 1101   MCH 31.2 03/29/2015 0912   MCHC 32.8 11/01/2023 1101   MCHC 35.6 03/29/2015 0912   RDW 12.4 11/01/2023 1101   RDW 13.3 10/13/2011 1515   LYMPHSABS 1.0 11/01/2023 1101   MONOABS 0.3 03/29/2015 0912   EOSABS 0.2 11/01/2023 1101   BASOSABS 0.1 11/01/2023 1101       Latest Ref Rng & Units 12/02/2023   10:51 AM 04/13/2023   11:26 AM  PFT Results  FVC-Pre L 2.52  2.22  P  FVC-Predicted Pre % 113  98  P  FVC-Post L  2.20  P  FVC-Predicted Post %  97  P  Pre FEV1/FVC % % 71  84  P  Post FEV1/FCV % %  85  P  FEV1-Pre L 1.78  1.86  P  FEV1-Predicted Pre % 109  112  P  FEV1-Post L  1.87  P  DLCO uncorrected ml/min/mmHg 10.10  7.49  P  DLCO UNC% % 60  44  P  DLVA Predicted % 61  51  P  TLC L 4.29  3.83  P  TLC % Predicted % 93  83  P  RV % Predicted % 72  81  P    P Preliminary result     Assessment & Plan:  Ms. Millett is an 83 year old female patient with a past medical history of GERD, hiatal hernia, hyperlipidemia and hypertension presenting to the pulmonary clinic as a referral from her primary care physician for chronic cough.  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 Echo with mildly increased PASP. cardiology eval. WHO  fc II.  #Moderate persistent asthma   Symptoms include mainly cough with dyspnea on exertion. She has good response with ICS-LABA  Reassuring is that her rpeat HRCT with stable nodularity and repeat PFTs with improved FVC, TLC and DLCO.   []  Repeat HRCT  and PFTs in 6 months.  []  C/w Advair HFA 230 but 1 puff bid instead of 2 puffs.  []  Agree with stopping Nitrofurantoin .    RTC 6 months  I personally spent a total of 30 minutes in the care of the patient today including preparing to see the patient, getting/reviewing separately obtained history, performing a  medically appropriate exam/evaluation, counseling and educating, documenting clinical information in the EHR, independently interpreting results, and communicating results.   Darrin Barn, MD [facility] 03/27/2024 11:53 AM

## 2024-03-29 ENCOUNTER — Other Ambulatory Visit: Payer: Self-pay | Admitting: Internal Medicine

## 2024-03-29 DIAGNOSIS — I10 Essential (primary) hypertension: Secondary | ICD-10-CM

## 2024-03-31 NOTE — Telephone Encounter (Signed)
 Requested Prescriptions  Pending Prescriptions Disp Refills   hydrochlorothiazide  (HYDRODIURIL ) 25 MG tablet [Pharmacy Med Name: hydroCHLOROthiazide  25 MG Oral Tablet] 90 tablet 0    Sig: TAKE 1 TABLET BY MOUTH DAILY     Cardiovascular: Diuretics - Thiazide Failed - 03/31/2024  1:17 PM      Failed - Last BP in normal range    BP Readings from Last 1 Encounters:  03/27/24 (!) 144/82         Passed - Cr in normal range and within 180 days    Creat  Date Value Ref Range Status  09/25/2015 0.73 0.60 - 0.93 mg/dL Final   Creatinine, Ser  Date Value Ref Range Status  12/16/2023 0.71 0.44 - 1.00 mg/dL Final         Passed - K in normal range and within 180 days    Potassium  Date Value Ref Range Status  12/16/2023 3.6 3.5 - 5.1 mmol/L Final  10/13/2011 3.8 3.5 - 5.1 mmol/L Final         Passed - Na in normal range and within 180 days    Sodium  Date Value Ref Range Status  12/16/2023 136 135 - 145 mmol/L Final  11/01/2023 138 134 - 144 mmol/L Final  10/13/2011 142 136 - 145 mmol/L Final         Passed - Valid encounter within last 6 months    Recent Outpatient Visits           4 months ago Recurrent UTI   [facility] at [facility], Leita DEL, MD   5 months ago Annual physical exam   [facility] at [facility], Leita DEL, MD   6 months ago Left facial swelling   [place] [facility] at [facility], Leita DEL, MD       Future Appointments             In 1 month MacDiarmid, Glendia, MD [facility] [place]             metoprolol  tartrate (LOPRESSOR ) 25 MG tablet [Pharmacy Med Name: Metoprolol  Tartrate 25 MG Oral Tablet] 270 tablet 0    Sig: TAKE 1 AND 1/2 TABLETS BY MOUTH  TWICE DAILY     Cardiovascular:  Beta Blockers Failed - 03/31/2024  1:17 PM      Failed - Last BP in normal range    BP Readings from Last 1 Encounters:  03/27/24  (!) 144/82         Passed - Last Heart Rate in normal range    Pulse Readings from Last 1 Encounters:  03/27/24 67         Passed - Valid encounter within last 6 months    Recent Outpatient Visits           4 months ago Recurrent UTI   [facility] at [facility], Leita DEL, MD   5 months ago Annual physical exam   [facility] Medicine at [facility], Leita DEL, MD   6 months ago Left facial swelling   [facility] at [facility], Leita DEL, MD       Future Appointments             In 1 month MacDiarmid, Glendia, MD [facility] [place]

## 2024-04-17 ENCOUNTER — Telehealth: Admitting: Internal Medicine

## 2024-04-17 ENCOUNTER — Encounter: Payer: Self-pay | Admitting: Internal Medicine

## 2024-04-17 VITALS — Ht 61.0 in | Wt 129.0 lb

## 2024-04-17 DIAGNOSIS — R053 Chronic cough: Secondary | ICD-10-CM

## 2024-04-17 DIAGNOSIS — T378X5A Adverse effect of other specified systemic anti-infectives and antiparasitics, initial encounter: Secondary | ICD-10-CM

## 2024-04-17 DIAGNOSIS — J849 Interstitial pulmonary disease, unspecified: Secondary | ICD-10-CM | POA: Diagnosis not present

## 2024-04-17 DIAGNOSIS — K449 Diaphragmatic hernia without obstruction or gangrene: Secondary | ICD-10-CM

## 2024-04-17 NOTE — Patient Instructions (Addendum)
.  di is also v   ICD-10-CM   1. ILD (interstitial lung disease) (HCC)  J84.9     2. Adverse effect of nitrofurantoin , initial encounter  T37.8X5A     3. Chronic cough  R05.3     4. 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

## 2024-04-17 NOTE — Progress Notes (Signed)
 JUNE 2025 Dr Victory    Subjective:   PATIENT ID: Rebecca Gutierrez LABOR [place] GENDER: female DOB: [birth year], MRN: [id number]  Chief Complaint  Patient presents with   Follow-up    3 month follow up, mixed connective tissue disease, SOB, CT: 10/18/23, PFT: 12/02/23  Breathing has been ok, but still has a persistent wet cough and causes her to be a bit hoarse. Otherwise no complaints. Still using her Symbicort  as prescribed -- previously didn't seem to be helping but in the past couple days it seems to be more effective.    HPI Rebecca Gutierrez is an 83 year old female patient with a past medical history of GERD, hiatal hernia, hyperlipidemia and hypertension presenting to the pulmonary clinic as a referral from her primary care physician for chronic cough.  She said that back in May her cough started to worsen now it is soon as she vocalizes she starts coughing her is dry and associated with shortness of breath specifically on exertion.  She denies any heartburn denies acid taste in mouth and denies postnasal drip.  She denies any history of asthma or any trigger to the cough.  She denies any chest wheezing or chest tightness.  She saw ENT specialist underwent a laryngoscopy did not find any abnormalities.  She does follow-up with a gastroenterologist and have her on Protonix  40 mg twice daily as well as famotidine  20 mg twice daily.  PFTs with normal spirometry, normal lung volumes and moderate to severely reduced DLCO. Suggestive of PVD. PEFR was increased consistent with possible ILD.   High res CT chest 04/08/2023 - Small to moderate enlarged meds LN  with diffuse perilymphatic nodularity suggestive of sarcoidosis.   ANA 1:1280 - ScL70 48 - Anti sm 76 - Anti U1 RNP 76    She saw Dr. Tobie at [facility] and was started on MMF and Prednisone  and have not felt any different.   She also reports no difference with the inhaler and is asking to be tapered off.   She denies any systemic symptoms including  dysphagia, raynaud, joint issues, dry mouth or eyes. Denies any rashes.   Family history she denies any family history of pulmonary diseases.  Social history never smoker drinks alcohol occasionally no illicit drug use and no pets at home.  OV 12/07/2023 - Patient continues with intermittent cough. She is currently only on Cellcept 500mg  daily. Initially when she was started on prednisone  and cell cept she felt significant better, she was then tapered off her medications and cough recurred, currently back on cellcept 500mg  daily but her cough persists. Repeat PFTs with improved FVC, TLC and DLCO. Repeat HRCT 10/2023 with stable nodularity.  I think she might have mild pulmonary hypertension based on her echocardiogram. I will discuss with her need for a bronchoscopy and EBUS as CT chest findings are more suggestive of sarcoidosis then MCTD.   ROS   JUNE 2025 ILD CONFERENCE      MDD discussion of CT scan    - Date or time period of scan:  HRCT: 10/18/2023 HRCT: 04/08/2023   - Discussion synopsis:  slightly U L dominanet proess. There is nodularity. Stable Oct 2024 -? May 2025. No progerssion. More c/w SARCOID. ALTERNATIVE PATERN  - What is the final conclusion per 2018 ATS/Fleischner Criteria - More c/w SARCOID. ALTERNATIVE PATERN  - Concordance with official report: concordant  Pathology discussion of biopsy: n/a    MDD Impression/Recs: 1) CT cw/ Sarcoid but Autoimmune panel and ILD are  not in syncrhony 2) get rheum opinion ; 3) start with getting ACE level, Quant Gold, Bronc with BAL and TBBx and 4)  have ILD doc in [place] see patient  after PFT in next few months  OV 02/21/2024 -second opinion at ILD clinic with Dr. Geronimo.  Subjective:  Patient ID: Rebecca Gutierrez, female , DOB: 1941/02/22 , age 84 y.o. , MRN: [id number] , ADDRESS: [address] [place] [address] PCP Justus Leita DEL, MD Patient Care Team: Justus Leita DEL, MD as PCP - General (Internal  Medicine) Darliss Rogue, MD as PCP - Cardiology (Cardiology) Gaston Hamilton, MD as Consulting Physician (Urology) Malka Domino, MD as Consulting Physician (Pulmonary Disease) [facility] (Optometry) Tobie Lady POUR, MD as Consulting Physician (Rheumatology)  This Provider for this visit: Treatment Team:  Attending Provider: Malka Domino, MD    02/21/2024 -   Chief Complaint  Patient presents with   Follow-up    ILD , referred by other pulmonologist.  Pt has no SOB, said she has a slight cough but that it is much improved.      HPI Rebecca Gutierrez 83 y.o. -being seen at the ILD clinic by Dr. Geronimo at the request of Dr. Georgiana Carmel Assaker.  She is here with her son who is an independent historian.  She has insidious onset of cough for the last 1 year.  She is told me that she has had intermittent course of prednisone  did not help but review of the records externally shows that prednisone  and CellCept have improved the cough in the past.  She brings up some mucus.  There is associated dyspnea but is extremely mild.  The cough itself is rated as moderate.  She has recently started inhaler therapy and this has helped her cough quite a bit.  She still has some occasional clearing of the throat but overall her quality of life is much improved after the inhaler.  She has had various autoimmune antibodies positive [see below] and this prompted a referral to Dr. Tobie at [facility].  She was placed on CellCept/prednisone  which she was taking as of June 2025 but since stopped because she told me that it does not work although she she did tell the pulmonologist in [place] that it was helping her.  It is unclear why she stopped therefore but she categorically denies any Raynaud's or ulcer or photosensitivity or malar rash or dryness.  She says she is puzzled why she has been visiting the rheumatologist  She was presented in the ILD  conference in June 2025 -biopsy was recommended.  This shows significant lymphocyte predominance greater than 50%.  Transbronchial biopsy was nondiagnostic..  This was in July 2025.  His CT is upper lobe predominant disease with differential diagnose of sarcoid [has made his adenopathy] versus hypersensitive pneumonitis.  Further review with her she denied any mold exposure or bird exposure or down pillow exposure.  She does have hiatal hernia and she has been on proton pump inhibitors for some years.  But then she stopped it late last year.  She was worried this was the cause of false positive ANA.  Se admitted to being on nitrofurantoin  for UTI prophylaxis because of bladder prolapse.  She has been on this for many years.   She has had serial CT and pulmonary function test and her lungs are stable and I concur with the radiologist  The overall burden of ILD is mild at this point  CT Chest data from date: May 2025  - personally visualized and independently interpreted : yes - my findings are: AS below  IMPRESSION: 1. Mediastinal adenopathy and upper/midlung zone predominant Peri lymphatic thickening and nodularity, findings indicative of sarcoid, stable from 04/08/2023. Findings are suggestive of an alternative diagnosis (not UIP) per consensus guidelines: Diagnosis of Idiopathic Pulmonary Fibrosis: An Official ATS/ERS/JRS/ALAT Clinical Practice Guideline. Am JINNY Honey Crit Care Med Vol 198, Iss 5, [id number], Feb 13 2017. 2. Moderate hiatal hernia. 3. Aortic atherosclerosis (ICD10-I70.0). Coronary artery calcification.     Electronically Signed   By: Newell Eke M.D.   On: 10/27/2023 15:29      LAB RESULTS last 96 hours No results found.   Latest Reference Range & Units 04/27/23 10:13  Speckled Pattern <1:40  >1:1280 !  Anti-Nuclear Ab by IFA (RDL) Negative  Positive !  Note:  Comment  Anti-Ro (SS-A) Ab (RDL) <20 Units <20  Anti-Jo-1 Ab (RDL) <20 Units <20   Anti-PL-7 Ab (RDL) Negative  Negative  Anti-PL-12 Ab (RDL) Negative  Negative  Anti-EJ Ab (RDL) Negative  Negative  Anti-OJ Ab (RDL) Negative  Negative  Anti-SRP Ab (RDL) Negative  Negative  Anti-Mi-2 Ab (RDL) Negative  Negative  Anti-TIF-1gamma Ab (RDL) <20 Units <20  Anti-MDA-5 Ab (CADM-140)(RDL) <20 Units <20  Anti-NXP-2 (P140) Ab (RDL) <20 Units <20  Anti-SAE1 Ab, IgG (RDL) <20 Units <20  Anti-PM/Scl-100 Ab (RDL) <20 Units <20  Anti-Ku Ab (RDL) Negative  Negative  Anti-SS-A 52kD Ab, IgG (RDL) <20 Units <20  Anti-U1 RNP Ab (RDL) <20 Units <20 Units 76 (H) 105 (H)  Anti-U2 RNP Ab (RDL) Negative  Negative  Anti-U3 RNP (Fibrillarin)(RDL) Negative  Negative  ANA Plus 12 Interpretation  Comment  Repeat Anti-Scl-70 Negative  P  Anti-La (SS-B) Ab (RDL) <20 Units <20  Anti-Chromatin Ab, IgG (RDL) <20 Units <20  !: Data is abnormal (H): Data is abnormally high   Latest Reference Range & Units 12/21/23 12:32  Monocyte-Macrophage-Serous Fluid 50 - 90 % 24 (L)  Other Cells, Fluid % LINING CELLS PRESENT  Color, Fluid  COLORLESS  Total Nucleated Cell Count, Fluid 0 - 1,000 cu mm 150  Fluid Type-FCT  BRONCHIAL ALVEOLAR LAVAGE (C)  Lymphs, Fluid % 55  Eos, Fluid % 15  Appearance, Fluid CLEAR  TURBID !  Neutrophil Count, Fluid 0 - 25 % 6  (L): Data is abnormally low !: Data is abnormal    OV 03/27/2024  DR Assaker- Ms. Hargan is feeling good with Advair 230 and Aerochamber. She saw Dr. Geronimo in for an ILD visit and thought her ILD is secondary to Nitrofurantoin  which she has been on for 3 years. She is currenly off of that and on Keflex  daily. We will plan to repeat her CT chest and PFTs in 6 months. I advised her to go down on her advair to 1 puff bid however should she have any recurrent symptoms she should go back to 2 puffs bid.   OV 04/17/2024  Subjective:  Patient ID: Rebecca Gutierrez, female , DOB: 1940-07-19 , age 6 y.o. , MRN: [id number] , ADDRESS: [address] [place] [address] PCP Justus Leita DEL, MD Patient Care Team: Justus Leita DEL, MD as PCP - General (Internal Medicine) Darliss Rogue, MD as PCP - Cardiology (Cardiology) Gaston Hamilton, MD as Consulting Physician (Urology) Assaker, Darrin, MD as Consulting Physician (Pulmonary Disease) [facility] (Optometry) Tobie Lady POUR, MD as Consulting Physician (Rheumatology)  This Provider for this visit: Treatment Team:  Attending Provider: Geronimo Amel, MD  Type of visit: Video Virtual Visit Identification of patient Keya Wynes Sowell with [birth year] and MRN [id number] - 2 person identifier Risks: Risks, benefits, limitations of telephone visit explained. Patient understood and verbalized agreement to proceed Anyone else on call: just patient Patient location: [facility] - diniring room This provider location: [address]; [place]; [place] [address]. [facility]. [phone]   04/17/2024 -   Chief Complaint  Patient presents with   Interstitial Lung Disease    Pt states since LOV breathing has been fine  Dry Cough has gotten better,    Follwu  MILD ILD   - HRCT May 2025, ANA/RNP positive    - Interstitial lung disease very likely due to Macrobid ; stop Macrobid  September 2025. HPI Malayjah Otoole Carbone 83 y.o. -in this video visit purposes to follow-up on symptoms particular after stopping Macrobid  and starting inhaled Advair.  I saw her in September 2025.  She also followed up with Dr. Malka in [place] couple weeks ago.  She reported significant improvement in her cough and shortness of breath after being on Advair and being off Macrobid .  She is now on cephalexin  for UTI prophylaxis.  She states to me that she is nearly asymptomatic.  Her Advair has been reduced to 1 puff twice daily.  She has upcoming pulmonary function test and high-res CT in April 2026 which would mark a 1 year time point since the  previous CT chest.  This will be done at [facility] and followed up with the [facility] Dr. Malka.  I did touch base with him via secure message.  He is happy to follow her.  She is up-to-date with her vaccines and plans to get everything with the 1 weeks space.   SYMPTOM SCALE - ILD 02/21/2024 04/17/2024 On adavair - reduced to 1 puff bid  Current weight    O2 use ra   Shortness of Breath 0 -> 5 scale with 5 being worst (score 6 If unable to do)   At rest 0   Simple tasks - showers, clothes change, eating, shaving 0   Household (dishes, doing bed, laundry) 0   Shopping 0   Walking level at own pace 0   Walking up Stairs 1   Total (30-36) Dyspnea Score 1 Denies any dyspne  How bad is your cough? 2 Not coughing up anymore. OCc mucus. VOice has normalized.  How bad is your fatigue 0   How bad is appetiee 00   How bad is nausea 0   How bad is vomiting?  0   How bad is diarrhea? 0   How bad is anxiety? 00   How bad is depression 0   Any chronic pain - if so where and how bad 2        SIT STAND TEST - goal 15 times   02/21/2024    O2 used ra   PRobe - finter or forehead finger   Number sit and stand completed - goal 15 15   Time taken to complete 43 sec   Resting Pulse Ox/HR/Dyspnea  95% and 70/min and dyspnea of 0/10    Peak measures 89 % and 78/min and dyspnea of 0/10   Final Pulse Ox/HR 95% and 78/min and dyspnea of 0/10   Desaturated </= 88% no   Desaturated <= 3% points no   Got Tachycardic >/= 90/min no  Miscellaneous comments x      PFT     Latest Ref Rng & Units 12/02/2023   10:51 AM 04/13/2023   11:26 AM  PFT Results  FVC-Pre L 2.52  2.22  P  FVC-Predicted Pre % 113  98  P  FVC-Post L  2.20  P  FVC-Predicted Post %  97  P  Pre FEV1/FVC % % 71  84  P  Post FEV1/FCV % %  85  P  FEV1-Pre L 1.78  1.86  P  FEV1-Predicted Pre % 109  112  P  FEV1-Post L  1.87  P  DLCO uncorrected ml/min/mmHg 10.10  7.49  P  DLCO UNC% % 60  44  P  DLVA Predicted % 61  51  P   TLC L 4.29  3.83  P  TLC % Predicted % 93  83  P  RV % Predicted % 72  81  P    P Preliminary result       LAB RESULTS last 96 hours No results found.       has a past medical history of Abnormal ECG (10/28/2017), Allergy, Anxiety, Aortic atherosclerosis, [place]dalek hernia (bilateral), Bradycardia (11/10/2017), Chronic cough, Coronary artery disease, Cystocele, midline, Disorder of bilirubin excretion, Dyspnea, Frequent PVCs, GERD (gastroesophageal reflux disease), Gilbert's syndrome, Hearing loss (1975), Hepatic steatosis, History of bilateral cataract extraction, History of hiatal hernia, History of recurrent UTIs, Hoarseness, Hyperlipidemia (2000), Hypertension (2000), ILD (interstitial lung disease) (HCC), Long-term use of aspirin therapy, MVP (mitral valve prolapse), Nephrolithiasis, Pulmonary HTN (HCC), Sarcoidosis, Skin cancer, and Systemic lupus (HCC).   reports that she has never smoked. She has never used smokeless tobacco.  Past Surgical History:  Procedure Laterality Date   ABDOMINAL HYSTERECTOMY     ANTERIOR AND POSTERIOR REPAIR N/A 05/02/2013   Procedure: ANTERIOR (CYSTOCELE) AND POSTERIOR REPAIR (RECTOCELE);  Surgeon: Norleen LULLA Server, MD;  Location: [facility] ORS;  Service: Gynecology;  Laterality: N/A;   BRAVO PH STUDY N/A 10/21/2021   Procedure: BRAVO PH STUDY;  Surgeon: Jinny Carmine, MD;  Location: [facility] ENDOSCOPY;  Service: Endoscopy;  Laterality: N/A;   CATARACT EXTRACTION, BILATERAL     CHOLECYSTECTOMY  10/15/2020   COLONOSCOPY  08/28/2011   Procedure: COLONOSCOPY;  Surgeon: Claudis RAYMOND Rivet, MD;  Location: [facility] ENDO SUITE;  Service: Endoscopy;  Laterality: N/A;  930   ESOPHAGOGASTRODUODENOSCOPY N/A 10/21/2021   Procedure: ESOPHAGOGASTRODUODENOSCOPY (EGD);  Surgeon: Jinny Carmine, MD;  Location: [facility] ENDOSCOPY;  Service: Endoscopy;  Laterality: N/A;   EYE SURGERY Bilateral 02/13/2011   other 2013   MOHS SURGERY  06/2016   Removed skin cancer from Rt shin   TUBAL  LIGATION     VAGINAL HYSTERECTOMY N/A 05/02/2013   Procedure: HYSTERECTOMY VAGINAL;  Surgeon: Norleen LULLA Server, MD;  Location: [facility] ORS;  Service: Gynecology;  Laterality: N/A;   VIDEO BRONCHOSCOPY WITH ENDOBRONCHIAL ULTRASOUND Bilateral 12/21/2023   Procedure: BRONCHOSCOPY, WITH EBUS;  Surgeon: Malka Domino, MD;  Location: [facility] ORS;  Service: Pulmonary;  Laterality: Bilateral;    Allergies  Allergen Reactions   Ciprofloxacin  Other (See Comments)    Tendinitis    Levaquin  [Levofloxacin ] Other (See Comments)    insomnia   Potassium Clavulanate [Clavulanic Acid] Diarrhea   Macrobid  [Nitrofurantoin ]     Per Dr. Geronimo - Do not ever take this   [name] Diarrhea   [name]  Palpitations and Rash    Last reaction was in 1969    Immunization History  Administered Date(s) Administered   Fluad  Quad(high Dose 65+) 04/18/2019, 04/29/2020, 04/23/2022   Fluad Trivalent(High Dose 65+) 05/03/2023   INFLUENZA, HIGH DOSE SEASONAL PF 03/31/2017, 04/22/2018, 03/19/2021   Influenza Split 04/01/2015   Influenza Whole 03/25/2006, 05/25/2011   Influenza,inj,Quad PF,6+ Mos 03/01/2013, 03/14/2014, 03/24/2016   PFIZER(Purple Top)SARS-COV-2 Vaccination 06/29/2019, 07/20/2019, 04/03/2020   PNEUMOCOCCAL CONJUGATE-20 03/02/2023   Pfizer Covid-19 Vaccine Bivalent Booster 50yrs & up 03/11/2021   Pfizer(Comirnaty)Fall Seasonal Vaccine 12 years and older 04/22/2023   Pneumococcal Conjugate-13 09/17/2014   Pneumococcal Polysaccharide-23 09/23/2009   Td 02/04/2004, 03/14/2014   Td (Adult), 2 Lf Tetanus Toxid, Preservative Free 02/04/2004, 03/14/2014   Zoster Recombinant(Shingrix) 01/09/2019, 05/18/2019   Zoster, Live 07/17/2010    Family History  Problem Relation Age of Onset   Hypertension Mother    Cancer Mother        stomach   Healthy Father    Healthy Brother    Colon cancer Neg Hx    Prostate cancer Neg Hx    Kidney cancer Neg Hx    Bladder Cancer Neg Hx     Breast cancer Neg Hx    Heart disease Neg Hx      Current Outpatient Medications:    aspirin EC 81 MG tablet, Take 81 mg by mouth at bedtime. , Disp: , Rfl:    cephALEXin  (KEFLEX ) 250 MG capsule, Take 1 capsule (250 mg total) by mouth daily., Disp: 90 capsule, Rfl: 0   Cholecalciferol 50 MCG (2000 UT) TABS, Take 1 capsule by mouth in the morning., Disp: , Rfl:    estradiol  (ESTRACE  VAGINAL) 0.1 MG/GM vaginal cream, Apply 0.5mg  (pea-sized amount)  just inside the vaginal introitus with a finger-tip on Monday, Wednesday and Friday nights., Disp: 30 g, Rfl: 0   famotidine  (PEPCID ) 20 MG tablet, TAKE 1 TABLET BY MOUTH TWICE  DAILY, Disp: 180 tablet, Rfl: 3   fluticasone -salmeterol (ADVAIR HFA) 230-21 MCG/ACT inhaler, Inhale 2 puffs into the lungs 2 (two) times daily., Disp: 3 each, Rfl: 12   hydrochlorothiazide  (HYDRODIURIL ) 25 MG tablet, TAKE 1 TABLET BY MOUTH DAILY, Disp: 90 tablet, Rfl: 0   metoprolol  tartrate (LOPRESSOR ) 25 MG tablet, TAKE 1 AND 1/2 TABLETS BY MOUTH  TWICE DAILY, Disp: 270 tablet, Rfl: 0   simvastatin  (ZOCOR ) 10 MG tablet, Take 1 tablet (10 mg total) by mouth daily., Disp: 90 tablet, Rfl: 3   Spacer/Aero-Holding Chambers (AEROCHAMBER MV) inhaler, Use as instructed, Disp: 1 each, Rfl: 0      Objective:   Vitals:   04/17/24 0953  Weight: 129 lb (58.5 kg)  Height: 5' 1 (1.549 m)    Estimated body mass index is 24.37 kg/m as calculated from the following:   Height as of this encounter: 5' 1 (1.549 m).   Weight as of this encounter: 129 lb (58.5 kg).  @WEIGHTCHANGE @  [employer]   04/17/24 0953  Weight: 129 lb (58.5 kg)     Physical Exam   General: No distress. Looks well O2 at rest: no Cane present: no Sitting in wheel chair: no Frail: no Obese: no Neuro: Alert and Oriented x 3. GCS 15. Speech normal Psych: Pleasant Resp: , No overt respiratory distress CVS: Normal heart sounds. Murmurs - no Ext: Stigmata of Connective Tissue Disease -  no HEENT: Normal upper airway. PEERL +. No post nasal drip        Assessment/     Assessment & Plan ILD (interstitial lung disease) (HCC)  Adverse effect of nitrofurantoin , initial encounter  Chronic cough  Hiatal hernia  PLAN Patient Instructions  .di is also v   ICD-10-CM   1. ILD (interstitial lung disease) (HCC)  J84.9     2. Adverse effect of nitrofurantoin , initial encounter  T37.8X5A     3. Chronic cough  R05.3     4. 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    SIGNATURE    Dr. Dorethia Cave, M.D., F.C.C.P,  Pulmonary and Critical Care Medicine Staff Physician, [facility] Director - Interstitial Lung Disease  Program  Pulmonary Fibrosis [facility] at [facility] [place], [place], [address]  Pager: [phone], If no answer or between  15:00h - 7:00h: call [phone] Telephone: [phone]  10:18 AM 04/17/2024

## 2024-05-03 ENCOUNTER — Ambulatory Visit (INDEPENDENT_AMBULATORY_CARE_PROVIDER_SITE_OTHER): Admitting: Internal Medicine

## 2024-05-03 ENCOUNTER — Encounter: Payer: Self-pay | Admitting: Internal Medicine

## 2024-05-03 VITALS — BP 124/72 | HR 76 | Ht 61.0 in | Wt 131.0 lb

## 2024-05-03 DIAGNOSIS — J849 Interstitial pulmonary disease, unspecified: Secondary | ICD-10-CM

## 2024-05-03 DIAGNOSIS — K219 Gastro-esophageal reflux disease without esophagitis: Secondary | ICD-10-CM

## 2024-05-03 DIAGNOSIS — I10 Essential (primary) hypertension: Secondary | ICD-10-CM

## 2024-05-03 MED ORDER — METOPROLOL TARTRATE 25 MG PO TABS: 37.5000 mg | ORAL_TABLET | Freq: Two times a day (BID) | ORAL | 1 refills | Status: AC

## 2024-05-03 MED ORDER — HYDROCHLOROTHIAZIDE 25 MG PO TABS: 25.0000 mg | ORAL_TABLET | Freq: Every day | ORAL | 1 refills | Status: AC

## 2024-05-03 NOTE — Patient Instructions (Signed)
 Increase famotidine  to twice a day to reduce reflux and possible worsening of phlegm.

## 2024-05-03 NOTE — Assessment & Plan Note (Signed)
 Well controlled blood pressure today. Current regimen is hydrochlorothiazide  and metoprolol . No medication side effects noted.  BMP normal in May.

## 2024-05-03 NOTE — Progress Notes (Signed)
 Date:  05/03/2024   Name:  Rebecca Gutierrez   DOB:  Mar 20, 1941   MRN:  [id number]   Chief Complaint: Hypertension  Hypertension This is a chronic problem. The problem is controlled. Pertinent negatives include no chest pain, headaches, palpitations or shortness of breath. There are no known risk factors for coronary artery disease. There is no history of kidney disease, CAD/MI or CVA.  Gastroesophageal Reflux She complains of coughing, heartburn and a hoarse voice. She reports no abdominal pain, no chest pain or no wheezing. This is a recurrent problem. The problem occurs frequently. The problem has been gradually worsening. The heartburn duration is less than a minute. The heartburn is located in the substernum. Pertinent negatives include no fatigue. She has tried a histamine-2 antagonist for the symptoms.  ILD -  seen by Pulmonary, having more cough and phlegm.  On inhalers with space.  Review of Systems  Constitutional:  Negative for fatigue and unexpected weight change.  HENT:  Positive for hoarse voice. Negative for trouble swallowing.   Eyes:  Negative for visual disturbance.  Respiratory:  Positive for cough. Negative for chest tightness, shortness of breath and wheezing.   Cardiovascular:  Negative for chest pain, palpitations and leg swelling.  Gastrointestinal:  Positive for heartburn. Negative for abdominal pain, constipation and diarrhea.       Reflux  Musculoskeletal:  Negative for arthralgias and myalgias.  Neurological:  Negative for dizziness, weakness, light-headedness and headaches.  Psychiatric/Behavioral:  Negative for dysphoric mood. The patient is not nervous/anxious.      Lab Results  Component Value Date   NA 136 12/16/2023   K 3.6 12/16/2023   CO2 28 12/16/2023   GLUCOSE 119 (H) 12/16/2023   BUN 18 12/16/2023   CREATININE 0.71 12/16/2023   CALCIUM 9.7 12/16/2023   EGFR 69 11/01/2023   GFRNONAA >60 12/16/2023   Lab Results  Component Value Date    CHOL 159 11/01/2023   HDL 62 11/01/2023   LDLCALC 82 11/01/2023   TRIG 81 11/01/2023   CHOLHDL 2.6 11/01/2023   Lab Results  Component Value Date   TSH 2.740 11/01/2023   Lab Results  Component Value Date   HGBA1C 5.5 11/01/2023   Lab Results  Component Value Date   WBC 8.3 11/01/2023   HGB 13.7 11/01/2023   HCT 41.8 11/01/2023   MCV 92 11/01/2023   PLT 246 11/01/2023   Lab Results  Component Value Date   ALT 32 10/30/2022   AST 35 10/30/2022   ALKPHOS 73 10/30/2022   BILITOT 1.4 (H) 10/30/2022   Lab Results  Component Value Date   VD25OH 32.2 04/01/2016     Patient Active Problem List   Diagnosis Date Noted   Shortness of breath 12/08/2023   Systemic lupus erythematosus (SLE) in adult [facility]) 07/06/2023   ILD (interstitial lung disease) (HCC) 07/06/2023   Chronic cough 01/27/2023   Sebaceous cyst 01/27/2023   Hepatic steatosis 12/07/2021   Gastroesophageal reflux disease    Aortic atherosclerosis 02/02/2020   Non-melanoma skin cancer 10/25/2019   Cystocele, midline 04/11/2018   Moderate mitral insufficiency 11/10/2017   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 Geronimo - Do not ever take this   [name] Diarrhea   [name]  Palpitations and Rash    Last reaction was in 1969    Past Surgical History:  Procedure Laterality Date   ABDOMINAL HYSTERECTOMY     ANTERIOR AND POSTERIOR REPAIR N/A 05/02/2013   Procedure: ANTERIOR (CYSTOCELE) AND POSTERIOR REPAIR (RECTOCELE);  Surgeon:  Norleen LULLA Server, MD;  Location: [facility] ORS;  Service: Gynecology;  Laterality: N/A;   BRAVO PH STUDY N/A 10/21/2021   Procedure: BRAVO PH STUDY;  Surgeon: Jinny Carmine, MD;  Location: [facility] ENDOSCOPY;  Service: Endoscopy;  Laterality: N/A;   CATARACT EXTRACTION, BILATERAL     CHOLECYSTECTOMY  10/15/2020   COLONOSCOPY  08/28/2011   Procedure: COLONOSCOPY;  Surgeon: Claudis RAYMOND Rivet, MD;  Location: [facility] ENDO SUITE;  Service: Endoscopy;  Laterality: N/A;  930   ESOPHAGOGASTRODUODENOSCOPY N/A 10/21/2021   Procedure: ESOPHAGOGASTRODUODENOSCOPY (EGD);  Surgeon: Jinny Carmine, MD;  Location: [facility] ENDOSCOPY;  Service: Endoscopy;  Laterality: N/A;   EYE SURGERY Bilateral 02/13/2011   other 2013   MOHS SURGERY  06/2016   Removed skin cancer from Rt shin   TUBAL LIGATION     VAGINAL HYSTERECTOMY N/A 05/02/2013   Procedure: HYSTERECTOMY VAGINAL;  Surgeon: Norleen LULLA Server, MD;  Location: [facility] ORS;  Service: Gynecology;  Laterality: N/A;   VIDEO BRONCHOSCOPY WITH ENDOBRONCHIAL ULTRASOUND Bilateral 12/21/2023   Procedure: BRONCHOSCOPY, WITH EBUS;  Surgeon: Malka Domino, MD;  Location: [facility] ORS;  Service: Pulmonary;  Laterality: Bilateral;    Social History   Tobacco Use   Smoking status: Never   Smokeless tobacco: Never  Vaping Use   Vaping status: Never Used  Substance Use Topics   Alcohol use: Not Currently    Alcohol/week: 1.0 standard drink of alcohol    Types: 1 Standard drinks or equivalent per week   Drug use: Never     Medication list has been reviewed and updated.  Current Meds  Medication Sig   aspirin EC 81 MG tablet Take 81 mg by mouth at bedtime.    cephALEXin  (KEFLEX ) 250 MG capsule Take 1 capsule (250 mg total) by mouth daily.   Cholecalciferol 50 MCG (2000 UT) TABS Take 1 capsule by mouth in the morning.   estradiol  (ESTRACE  VAGINAL) 0.1 MG/GM vaginal cream Apply 0.5mg  (pea-sized amount)  just inside the vaginal introitus with a finger-tip on Monday, Wednesday and Friday  nights.   famotidine  (PEPCID ) 20 MG tablet TAKE 1 TABLET BY MOUTH TWICE  DAILY   fluticasone -salmeterol (ADVAIR HFA) 230-21 MCG/ACT inhaler Inhale 2 puffs into the lungs 2 (two) times daily.   simvastatin  (ZOCOR ) 10 MG tablet Take 1 tablet (10 mg total) by mouth daily.   Spacer/Aero-Holding Chambers (AEROCHAMBER MV) inhaler Use as instructed   [DISCONTINUED] hydrochlorothiazide  (HYDRODIURIL ) 25 MG tablet TAKE 1 TABLET BY MOUTH DAILY   [DISCONTINUED] metoprolol  tartrate (LOPRESSOR ) 25 MG tablet TAKE 1 AND 1/2 TABLETS BY MOUTH  TWICE DAILY       05/03/2024    9:37 AM 11/01/2023   10:11 AM 05/03/2023    9:59 AM 03/02/2023   10:16 AM  GAD 7 : Generalized Anxiety Score  Nervous, Anxious, on Edge 0 0 0 0  Control/stop worrying 0 0 0 0  Worry too much - different things 0 0 0 0  Trouble relaxing 0 0 0 0  Restless  0 0 0 0  Easily annoyed or irritable 0 0 0 0  Afraid - awful might happen 0 0 0 0  Total GAD 7 Score 0 0 0 0  Anxiety Difficulty Not difficult at all Not difficult at all Not difficult at all Not difficult at all       05/03/2024    9:37 AM 12/08/2023    4:09 PM 11/01/2023   10:10 AM  Depression screen PHQ 2/9  Decreased Interest 0 0 0  Down, Depressed, Hopeless 0 0 0  PHQ - 2 Score 0 0 0  Altered sleeping 0 0 0  Tired, decreased energy 0 0 0  Change in appetite 0 1 0  Feeling bad or failure about yourself  0 0 0  Trouble concentrating 0 0 0  Moving slowly or fidgety/restless 0 0 0  Suicidal thoughts 0 0 0  PHQ-9 Score 0 1  0   Difficult doing work/chores Not difficult at all Not difficult at all Not difficult at all     Data saved with a previous flowsheet row definition    BP Readings from Last 3 Encounters:  05/03/24 124/72  03/27/24 (!) 144/82  02/25/24 (!) 164/78    Physical Exam Vitals and nursing note reviewed.  Constitutional:      General: She is not in acute distress.    Appearance: She is well-developed.  HENT:     Head: Normocephalic and  atraumatic.  Cardiovascular:     Rate and Rhythm: Normal rate and regular rhythm.  Pulmonary:     Effort: Pulmonary effort is normal. No respiratory distress.     Breath sounds: No wheezing or rhonchi.  Musculoskeletal:     Cervical back: Normal range of motion.     Right lower leg: No edema.     Left lower leg: No edema.  Skin:    General: Skin is warm and dry.     Findings: No rash.  Neurological:     General: No focal deficit present.     Mental Status: She is alert and oriented to person, place, and time.  Psychiatric:        Mood and Affect: Mood normal.        Behavior: Behavior normal.     Wt Readings from Last 3 Encounters:  05/03/24 131 lb (59.4 kg)  04/17/24 129 lb (58.5 kg)  03/27/24 131 lb 12.8 oz (59.8 kg)    BP 124/72   Pulse 76   Ht 5' 1 (1.549 m)   Wt 131 lb (59.4 kg)   SpO2 98%   BMI 24.75 kg/m   Assessment and Plan:  Problem List Items Addressed This Visit       Unprioritized   Essential hypertension, benign - Primary (Chronic)   Well controlled blood pressure today. Current regimen is hydrochlorothiazide  and metoprolol . No medication side effects noted.  BMP normal in May.        Relevant Medications   hydrochlorothiazide  (HYDRODIURIL ) 25 MG tablet   metoprolol  tartrate (LOPRESSOR ) 25 MG tablet   Gastroesophageal reflux disease (Chronic)   Now off of PPI but taking Pepcid  daily. Now having a bit more reflux discomfort but no dysphagia. Recommend Pepcid  bid      ILD (interstitial lung disease) (HCC) (Chronic)   Cough much improved with use of spacer for inhaler. Seen by Pulmonary recently - changing the dose of inhaler to once a day Also recommend never to take Macrobid . If symptoms continue to progress, I recommend  seeing Pulmonary sooner than planned       Return in about 6 months (around 10/31/2024) for CPX  Dr. Lemon.    Leita HILARIO Adie, MD [facility] [place]

## 2024-05-03 NOTE — Assessment & Plan Note (Addendum)
 Cough much improved with use of spacer for inhaler. Seen by Pulmonary recently - changing the dose of inhaler to once a day Also recommend never to take Macrobid . If symptoms continue to progress, I recommend seeing Pulmonary sooner than planned

## 2024-05-03 NOTE — Assessment & Plan Note (Signed)
 Now off of PPI but taking Pepcid  daily. Now having a bit more reflux discomfort but no dysphagia. Recommend Pepcid  bid

## 2024-05-08 ENCOUNTER — Ambulatory Visit (INDEPENDENT_AMBULATORY_CARE_PROVIDER_SITE_OTHER): Payer: Self-pay | Admitting: Urology

## 2024-05-08 VITALS — BP 157/84 | HR 75 | Ht 61.0 in | Wt 130.0 lb

## 2024-05-08 DIAGNOSIS — N3020 Other chronic cystitis without hematuria: Secondary | ICD-10-CM | POA: Diagnosis not present

## 2024-05-08 MED ORDER — CEPHALEXIN 250 MG PO CAPS
250.0000 mg | ORAL_CAPSULE | Freq: Every day | ORAL | 0 refills | Status: DC
Start: 2024-05-08 — End: 2024-05-08

## 2024-05-08 MED ORDER — CEPHALEXIN 250 MG PO CAPS: 250.0000 mg | ORAL_CAPSULE | Freq: Every day | ORAL | 3 refills | Status: AC

## 2024-05-08 NOTE — Progress Notes (Signed)
 05/08/2024 9:43 AM   Rebecca Gutierrez [birth year] [id number]  Referring provider: Justus Leita DEL, MD [address] [place],  [place] [address]  No chief complaint on file.   HPI: I saw patient in 2019.   She can feel some vaginal bulging but she was actually quite nonspecific about it and has been chronic.  Used to be managed with pessary    Patient has had 5 or 6 bladder infections with typical cystitis symptoms that generally respond to antibiotics.  She does not really have prolapse symptoms otherwise.  She is had a hysterectomy.  She had an ultrasound that demonstrated a residual of 236 mL. Kidneys are normal   1 pelvic examination patient had moderate grade 3 cystocele with central defect. Vaginal cuff descended from 8 or 9 cm to approximately 5 cm. No rectocele. No stress incontinence. Cystocele was just reached the introitus   Residual today 175 mL   Picture drawn. Patient has a relatively asymptomatic cystocele. It could be related to the elevated residual. She understands if she had a transvaginal vault suspension with cystocele repair and graft her residual may improve with a natural history of her bladder infections is likely not going to change. She was getting breakthrough infections on trimethoprim . I suggest to see me back on daily Macrodantin  in 2 months and check another residual. It is a lot of surgery to go through hoping to reduce bladder infection frequency. Role of urodynamics not discussed   Patient infection free on Macrodantin  but for some reason she thought she should take it 4 times a day   I spent a lot of time clarifying to take it once a day and prescription was renewed 90x3 and see in 4 month   Today Frequency stable.  Patient is very happy on daily Macrodantin .  Prolapse table.  Clinically not infected.   Today Frequency stable.  I last saw the patient 1 year ago.  Patient has seen her nurse practitioner 3 times since my last visit.  She  had Klebsiella in the urine at least twice in 2024.  Her renal ultrasound was 2021.  I do not think her prophylaxis was changed though it was discussed.  Discussion about pulmonary issues.  He has a coughing issue since a cholecystectomy and pulmonary are okay on Macrodantin .  She says she is only had 1 breakthrough infection and she would like to stay on it.  90 x 3 Macrodantin  sent to pharmacy and see in a year. She understands he gets too many breakthrough infections we will switch her and she agreed   Today Pulmonary in [place] finally stopped the Macrodantin  and her coughing issue did settle down.  She is doing great on daily cephalexin .  No infections.  Frequency stable.  Continence stable.  Could not leave a sample   PMH: Past Medical History:  Diagnosis Date   Abnormal ECG 10/28/2017   Allergy    Anxiety    Aortic atherosclerosis    Bochdalek hernia (bilateral)    Bradycardia 11/10/2017   Chronic cough    Coronary artery disease    Cystocele, midline    Disorder of bilirubin excretion    Dyspnea    Frequent PVCs    GERD (gastroesophageal reflux disease)    Gilbert's syndrome    Hearing loss 1975   Hepatic steatosis    History of bilateral cataract extraction    History of hiatal hernia    History of recurrent UTIs  Hoarseness    Hyperlipidemia 2000   Hypertension 2000   ILD (interstitial lung disease) (HCC)    Long-term use of aspirin therapy    MVP (mitral valve prolapse)    Nephrolithiasis    Pulmonary HTN (HCC)    Sarcoidosis    Skin cancer    Systemic lupus (HCC)     Surgical History: Past Surgical History:  Procedure Laterality Date   ABDOMINAL HYSTERECTOMY     ANTERIOR AND POSTERIOR REPAIR N/A 05/02/2013   Procedure: ANTERIOR (CYSTOCELE) AND POSTERIOR REPAIR (RECTOCELE);  Surgeon: Norleen LULLA Server, MD;  Location: [facility] ORS;  Service: Gynecology;  Laterality: N/A;   BRAVO PH STUDY N/A 10/21/2021   Procedure: BRAVO PH STUDY;  Surgeon: Jinny Carmine, MD;   Location: [facility] ENDOSCOPY;  Service: Endoscopy;  Laterality: N/A;   CATARACT EXTRACTION, BILATERAL     CHOLECYSTECTOMY  10/15/2020   COLONOSCOPY  08/28/2011   Procedure: COLONOSCOPY;  Surgeon: Claudis RAYMOND Rivet, MD;  [facility];  Service: Endoscopy;  Laterality: N/A;  930   ESOPHAGOGASTRODUODENOSCOPY N/A 10/21/2021   Procedure: ESOPHAGOGASTRODUODENOSCOPY (EGD);  Surgeon: Jinny Carmine, MD;  Location: [facility] ENDOSCOPY;  Service: Endoscopy;  Laterality: N/A;   EYE SURGERY Bilateral 02/13/2011   other 2013   MOHS SURGERY  06/2016   Removed skin cancer from Rt shin   TUBAL LIGATION     VAGINAL HYSTERECTOMY N/A 05/02/2013   Procedure: HYSTERECTOMY VAGINAL;  Surgeon: Norleen LULLA Server, MD;  Location: [facility] ORS;  Service: Gynecology;  Laterality: N/A;   VIDEO BRONCHOSCOPY WITH ENDOBRONCHIAL ULTRASOUND Bilateral 12/21/2023   Procedure: BRONCHOSCOPY, WITH EBUS;  Surgeon: Malka Domino, MD;  Location: [facility] ORS;  Service: Pulmonary;  Laterality: Bilateral;    Home Medications:  Allergies as of 05/08/2024       Reactions   Ciprofloxacin  Other (See Comments)   Tendinitis    Levaquin  [levofloxacin ] Other (See Comments)   insomnia   Potassium Clavulanate [clavulanic Acid] Diarrhea   Macrobid  [nitrofurantoin ]    Per Dr. Geronimo - Do not ever take this   [name] Diarrhea   [name]  Palpitations, Rash   Last reaction was in 1969        Medication List        Accurate as of May 08, 2024  9:43 AM. If you have any questions, ask your nurse or doctor.          AeroChamber MV inhaler Use as instructed   aspirin EC 81 MG tablet Take 81 mg by mouth at bedtime.   cephALEXin  250 MG capsule Commonly known as: KEFLEX  Take 1 capsule (250 mg total) by mouth daily.   Cholecalciferol 50 MCG (2000 UT) Tabs Take 1 capsule by mouth in the morning.   estradiol  0.1 MG/GM vaginal cream Commonly known as: ESTRACE  VAGINAL Apply 0.5mg  (pea-sized  amount)  just inside the vaginal introitus with a finger-tip on Monday, Wednesday and Friday nights.   famotidine  20 MG tablet Commonly known as: PEPCID  TAKE 1 TABLET BY MOUTH TWICE  DAILY   fluticasone -salmeterol 230-21 MCG/ACT inhaler Commonly known as: Advair HFA Inhale 2 puffs into the lungs 2 (two) times daily.   hydrochlorothiazide  25 MG tablet Commonly known as: HYDRODIURIL  Take 1 tablet (25 mg total) by mouth daily.   metoprolol  tartrate 25 MG tablet Commonly known as: LOPRESSOR  Take 1.5 tablets (37.5 mg total) by mouth 2 (two) times daily.   simvastatin  10 MG tablet Commonly known as: ZOCOR  Take 1 tablet (10 mg total) by mouth daily.  Allergies:  Allergies  Allergen Reactions   Ciprofloxacin  Other (See Comments)    Tendinitis    Levaquin  [Levofloxacin ] Other (See Comments)    insomnia   Potassium Clavulanate [Clavulanic Acid] Diarrhea   Macrobid  [Nitrofurantoin ]     Per Dr. Geronimo - Do not ever take this   [name] Diarrhea   [name]  Palpitations and Rash    Last reaction was in 1969    Family History: Family History  Problem Relation Age of Onset   Hypertension Mother    Cancer Mother        stomach   Healthy Father    Healthy Brother    Colon cancer Neg Hx    Prostate cancer Neg Hx    Kidney cancer Neg Hx    Bladder Cancer Neg Hx    Breast cancer Neg Hx    Heart disease Neg Hx     Social History:  reports that she has never smoked. She has never used smokeless tobacco. She reports that she does not currently use alcohol after a past usage of about 1.0 standard drink of alcohol per week. She reports that she does not use drugs.  ROS:                                        Physical Exam: There were no vitals taken for this visit.  Constitutional:  Alert and oriented, No acute distress. HEENT: [place] AT, moist mucus membranes.  Trachea midline, no masses.   Laboratory Data: Lab  Results  Component Value Date   WBC 8.3 11/01/2023   HGB 13.7 11/01/2023   HCT 41.8 11/01/2023   MCV 92 11/01/2023   PLT 246 11/01/2023    Lab Results  Component Value Date   CREATININE 0.71 12/16/2023    No results found for: PSA  No results found for: TESTOSTERONE  Lab Results  Component Value Date   HGBA1C 5.5 11/01/2023    Urinalysis    Component Value Date/Time   COLORURINE YELLOW 10/26/2022 1045   APPEARANCEUR Hazy (A) 02/12/2023 1136   LABSPEC 1.015 10/26/2022 1045   PHURINE 6.5 10/26/2022 1045   GLUCOSEU Negative 02/12/2023 1136   HGBUR TRACE (A) 10/26/2022 1045   BILIRUBINUR negative 11/17/2023 1506   BILIRUBINUR Negative 02/12/2023 1136   KETONESUR NEGATIVE 10/26/2022 1045   PROTEINUR Negative 11/17/2023 1506   PROTEINUR Negative 02/12/2023 1136   PROTEINUR NEGATIVE 10/26/2022 1045   UROBILINOGEN 0.2 11/17/2023 1506   UROBILINOGEN 0.2 05/02/2013 0930   NITRITE negative 11/17/2023 1506   NITRITE Negative 02/12/2023 1136   NITRITE NEGATIVE 10/26/2022 1045   LEUKOCYTESUR Large (3+) (A) 11/17/2023 1506   LEUKOCYTESUR 1+ (A) 02/12/2023 1136   LEUKOCYTESUR SMALL (A) 10/26/2022 1045    Pertinent Imaging:   Assessment & Plan: Cephalexin  90 x 3 sent to pharmacy and I will see in 1 year  1. Chronic cystitis (Primary)  - Urinalysis, Complete   No follow-ups on file.  Glendia DELENA Elizabeth, MD  [facility] [address], [address] [place], [place] [address] [phone]

## 2024-05-08 NOTE — Addendum Note (Signed)
 Addended byBETHA CORIE PLATER on: 05/08/2024 10:04 AM   Modules accepted: Orders

## 2024-07-04 ENCOUNTER — Ambulatory Visit
Admission: RE | Admit: 2024-07-04 | Discharge: 2024-07-04 | Disposition: A | Discharge status: Home / Self Care | Attending: Family Medicine | Admitting: Family Medicine

## 2024-07-04 ENCOUNTER — Ambulatory Visit: Payer: Self-pay

## 2024-07-04 ENCOUNTER — Ambulatory Visit
Admission: RE | Admit: 2024-07-04 | Discharge: 2024-07-04 | Disposition: A | Source: Ambulatory Visit | Attending: Family Medicine | Admitting: Family Medicine

## 2024-07-04 ENCOUNTER — Encounter: Payer: Self-pay | Admitting: Family Medicine

## 2024-07-04 ENCOUNTER — Ambulatory Visit (INDEPENDENT_AMBULATORY_CARE_PROVIDER_SITE_OTHER): Admitting: Family Medicine

## 2024-07-04 ENCOUNTER — Ambulatory Visit: Payer: Self-pay | Admitting: Family Medicine

## 2024-07-04 ENCOUNTER — Ambulatory Visit
Admission: RE | Admit: 2024-07-04 | Discharge: 2024-07-04 | Disposition: A | Discharge status: Home / Self Care | Source: Ambulatory Visit | Attending: Family Medicine | Admitting: Family Medicine

## 2024-07-04 VITALS — BP 152/74 | HR 69 | Temp 98.0°F | Ht 61.0 in | Wt 132.0 lb

## 2024-07-04 DIAGNOSIS — M545 Low back pain, unspecified: Secondary | ICD-10-CM | POA: Insufficient documentation

## 2024-07-04 DIAGNOSIS — N1339 Other hydronephrosis: Secondary | ICD-10-CM | POA: Diagnosis not present

## 2024-07-04 NOTE — Progress Notes (Signed)
 FYI, Pt notified of the US  results, she will schedule a follow up if symptoms persist.

## 2024-07-04 NOTE — Telephone Encounter (Signed)
 FYI Only or Action Required?: FYI only for provider: appointment scheduled on 1/20.  Patient was last seen in primary care on 05/03/2024 by Justus Leita DEL, MD.  Called Nurse Triage reporting Back Pain.  Symptoms began several weeks ago.  Interventions attempted: Nothing.  Symptoms are: gradually worsening.  Triage Disposition: See PCP When Office is Open (Within 3 Days)  Patient/caregiver understands and will follow disposition?: Yes   Reason for Triage: Patient is having pain in lower back, thinks she possibly has kidney stones. Pain was bad last night while trying to sleep and is still experiencing pain this morning.  Reason for Disposition  [1] MODERATE back pain (e.g., interferes with normal activities) AND [2] present > 3 days  Answer Assessment - Initial Assessment Questions 1. ONSET: When did the pain begin? (e.g., minutes, hours, days)     A month ago 2. LOCATION: Where does it hurt? (upper, mid or lower back)     Low back 3. SEVERITY: How bad is the pain?  (e.g., Scale 1-10; mild, moderate, or severe)     moderate 4. PATTERN: Is the pain constant? (e.g., yes, no; constant, intermittent)      intermittent 5. RADIATION: Does the pain shoot into your legs or somewhere else?     no 6. CAUSE:  What do you think is causing the back pain?      unknown 7. BACK OVERUSE:  Any recent lifting of heavy objects, strenuous work or exercise?     no 8. MEDICINES: What have you taken so far for the pain? (e.g., nothing, acetaminophen , NSAIDS)     no 9. NEUROLOGIC SYMPTOMS: Do you have any weakness, numbness, or problems with bowel/bladder control?     Denies numbness of legs.  10. OTHER SYMPTOMS: Do you have any other symptoms? (e.g., fever, abdomen pain, burning with urination, blood in urine)       denies  Protocols used: Back Pain-A-AH

## 2024-07-04 NOTE — Progress Notes (Signed)
" ° °  Acute Office Visit  Subjective:     Patient ID: Rebecca Gutierrez, female    DOB: [birth year], 84 y.o.   MRN: [id number]  Chief Complaint  Patient presents with   Back Pain    Patient reports she is having kidney pain, both sides, comes and goes, can not sleep on her side, x 1 month     HPI Patient is in today for for low back pain.  Back Pain Patient presents for evaluation of low back problems. Symptoms have been present for 1 month and include pain in LOWER BACK (sharp in character; 5/10 in severity). Initial inciting event: none. Symptoms are worse at nighttime. Alleviating factors identifiable by the patient are standing. Aggravating factors identifiable by the patient are bending sideways. Treatments initiated by the patient: none. Previous lower back problems: History of LBP. Previous work up: none. Previous treatments: none.   Review of Systems  All other systems reviewed and are negative.       Objective:    BP (!) 152/74   Pulse 69   Temp 98 F (36.7 C) (Oral)   Ht 5' 1 (1.549 m)   Wt 132 lb (59.9 kg)   SpO2 95%   BMI 24.94 kg/m     Physical Exam Vitals and nursing note reviewed.  Constitutional:      Appearance: Normal appearance.  HENT:     Head: Normocephalic.     Right Ear: External ear normal.     Left Ear: External ear normal.  Eyes:     Conjunctiva/sclera: Conjunctivae normal.  Cardiovascular:     Rate and Rhythm: Normal rate.  Pulmonary:     Effort: Pulmonary effort is normal. No respiratory distress.  Abdominal:     Palpations: Abdomen is soft.  Musculoskeletal:        General: Normal range of motion.  Skin:    General: Skin is warm.  Neurological:     Mental Status: She is alert and oriented to person, place, and time.  Psychiatric:        Mood and Affect: Mood normal.     No results found for any visits on 07/04/24.      Assessment & Plan:   Problem List Items Addressed This Visit   None Visit Diagnoses        Bilateral low back pain without sciatica, unspecified chronicity    -  Primary   Relevant Orders   POCT Urinalysis Dipstick   US  Renal (Completed)   DG Lumbar Spine Complete (Completed)     Rule out renal stones. Ordering Urine dip, US  renal. Ddx DDD  Pain control: OTC Aleve RTC if no improvement.  No orders of the defined types were placed in this encounter.   No follow-ups on file.  Katelin Kutsch K Chace Klippel, MD   "

## 2024-10-31 ENCOUNTER — Encounter: Admitting: Student

## 2024-12-13 ENCOUNTER — Ambulatory Visit

## 2025-05-07 ENCOUNTER — Ambulatory Visit: Admitting: Urology
